# Patient Record
Sex: Female | Born: 1950
Health system: Southern US, Community
[De-identification: ages and names within clinical notes are randomized; demographics above are authoritative.]

## PROBLEM LIST (undated history)

## (undated) DIAGNOSIS — R112 Nausea with vomiting, unspecified: Secondary | ICD-10-CM

## (undated) DIAGNOSIS — N3010 Interstitial cystitis (chronic) without hematuria: Secondary | ICD-10-CM

## (undated) DIAGNOSIS — T7840XA Allergy, unspecified, initial encounter: Secondary | ICD-10-CM

## (undated) DIAGNOSIS — K589 Irritable bowel syndrome without diarrhea: Secondary | ICD-10-CM

## (undated) DIAGNOSIS — R161 Splenomegaly, not elsewhere classified: Secondary | ICD-10-CM

## (undated) DIAGNOSIS — N182 Chronic kidney disease, stage 2 (mild): Secondary | ICD-10-CM

## (undated) DIAGNOSIS — I7300 Raynaud's syndrome without gangrene: Secondary | ICD-10-CM

## (undated) DIAGNOSIS — Z9889 Other specified postprocedural states: Secondary | ICD-10-CM

## (undated) DIAGNOSIS — K5790 Diverticulosis of intestine, part unspecified, without perforation or abscess without bleeding: Secondary | ICD-10-CM

## (undated) DIAGNOSIS — C8307 Small cell B-cell lymphoma, spleen: Secondary | ICD-10-CM

## (undated) DIAGNOSIS — K649 Unspecified hemorrhoids: Secondary | ICD-10-CM

## (undated) DIAGNOSIS — Z862 Personal history of diseases of the blood and blood-forming organs and certain disorders involving the immune mechanism: Secondary | ICD-10-CM

## (undated) DIAGNOSIS — H269 Unspecified cataract: Secondary | ICD-10-CM

## (undated) DIAGNOSIS — K219 Gastro-esophageal reflux disease without esophagitis: Secondary | ICD-10-CM

## (undated) DIAGNOSIS — K449 Diaphragmatic hernia without obstruction or gangrene: Secondary | ICD-10-CM

## (undated) DIAGNOSIS — M1990 Unspecified osteoarthritis, unspecified site: Secondary | ICD-10-CM

## (undated) DIAGNOSIS — G4700 Insomnia, unspecified: Secondary | ICD-10-CM

## (undated) DIAGNOSIS — D696 Thrombocytopenia, unspecified: Secondary | ICD-10-CM

## (undated) DIAGNOSIS — D649 Anemia, unspecified: Secondary | ICD-10-CM

## (undated) DIAGNOSIS — K5900 Constipation, unspecified: Secondary | ICD-10-CM

## (undated) DIAGNOSIS — M810 Age-related osteoporosis without current pathological fracture: Secondary | ICD-10-CM

## (undated) DIAGNOSIS — C8590 Non-Hodgkin lymphoma, unspecified, unspecified site: Secondary | ICD-10-CM

## (undated) DIAGNOSIS — K222 Esophageal obstruction: Secondary | ICD-10-CM

## (undated) HISTORY — DX: Interstitial cystitis (chronic) without hematuria: N30.10

## (undated) HISTORY — DX: Unspecified cataract: H26.9

## (undated) HISTORY — DX: Allergy, unspecified, initial encounter: T78.40XA

## (undated) HISTORY — PX: TONSILLECTOMY: SUR1361

## (undated) HISTORY — DX: Diverticulosis of intestine, part unspecified, without perforation or abscess without bleeding: K57.90

## (undated) HISTORY — DX: Constipation, unspecified: K59.00

## (undated) HISTORY — DX: Small cell b-cell lymphoma, spleen: C83.07

## (undated) HISTORY — DX: Personal history of diseases of the blood and blood-forming organs and certain disorders involving the immune mechanism: Z86.2

## (undated) HISTORY — PX: BREAST LUMPECTOMY: SHX2

## (undated) HISTORY — DX: Anemia, unspecified: D64.9

## (undated) HISTORY — DX: Unspecified osteoarthritis, unspecified site: M19.90

## (undated) HISTORY — PX: TUBAL LIGATION: SHX77

## (undated) HISTORY — PX: DILATION AND CURETTAGE OF UTERUS: SHX78

## (undated) HISTORY — DX: Age-related osteoporosis without current pathological fracture: M81.0

## (undated) HISTORY — DX: Gastro-esophageal reflux disease without esophagitis: K21.9

## (undated) HISTORY — DX: Raynaud's syndrome without gangrene: I73.00

## (undated) HISTORY — DX: Insomnia, unspecified: G47.00

## (undated) HISTORY — DX: Esophageal obstruction: K22.2

## (undated) HISTORY — DX: Irritable bowel syndrome without diarrhea: K58.9

## (undated) HISTORY — PX: ESOPHAGEAL DILATION: SHX303

## (undated) HISTORY — DX: Irritable bowel syndrome, unspecified: K58.9

## (undated) HISTORY — DX: Unspecified hemorrhoids: K64.9

## (undated) HISTORY — DX: Diaphragmatic hernia without obstruction or gangrene: K44.9

## (undated) HISTORY — DX: Chronic kidney disease, stage 2 (mild): N18.2

## (undated) HISTORY — DX: Splenomegaly, not elsewhere classified: R16.1

## (undated) HISTORY — DX: Thrombocytopenia, unspecified: D69.6

## (undated) HISTORY — DX: Non-Hodgkin lymphoma, unspecified, unspecified site: C85.90

---

## 1997-06-12 ENCOUNTER — Encounter: Payer: Self-pay | Admitting: Internal Medicine

## 1997-07-27 ENCOUNTER — Emergency Department (HOSPITAL_COMMUNITY): Admission: EM | Admit: 1997-07-27 | Discharge: 1997-07-27 | Payer: Self-pay | Admitting: Emergency Medicine

## 1997-08-23 ENCOUNTER — Inpatient Hospital Stay (HOSPITAL_COMMUNITY): Admission: AD | Admit: 1997-08-23 | Discharge: 1997-08-23 | Payer: Self-pay | Admitting: Obstetrics and Gynecology

## 1997-12-22 ENCOUNTER — Emergency Department (HOSPITAL_COMMUNITY): Admission: EM | Admit: 1997-12-22 | Discharge: 1997-12-22 | Payer: Self-pay | Admitting: Emergency Medicine

## 1997-12-22 ENCOUNTER — Encounter: Payer: Self-pay | Admitting: Emergency Medicine

## 1997-12-31 ENCOUNTER — Emergency Department (HOSPITAL_COMMUNITY): Admission: EM | Admit: 1997-12-31 | Discharge: 1997-12-31 | Payer: Self-pay | Admitting: Emergency Medicine

## 1998-09-21 ENCOUNTER — Emergency Department (HOSPITAL_COMMUNITY): Admission: EM | Admit: 1998-09-21 | Discharge: 1998-09-21 | Payer: Self-pay | Admitting: Emergency Medicine

## 1998-09-24 ENCOUNTER — Encounter (HOSPITAL_COMMUNITY): Admission: RE | Admit: 1998-09-24 | Discharge: 1998-12-23 | Payer: Self-pay | Admitting: Internal Medicine

## 1999-01-20 ENCOUNTER — Encounter: Payer: Self-pay | Admitting: Internal Medicine

## 1999-01-20 LAB — CONVERTED CEMR LAB

## 2000-01-09 ENCOUNTER — Encounter: Payer: Self-pay | Admitting: Internal Medicine

## 2000-01-09 ENCOUNTER — Ambulatory Visit (HOSPITAL_COMMUNITY): Admission: RE | Admit: 2000-01-09 | Discharge: 2000-01-09 | Payer: Self-pay | Admitting: Internal Medicine

## 2000-05-13 ENCOUNTER — Encounter: Payer: Self-pay | Admitting: Internal Medicine

## 2000-05-13 ENCOUNTER — Ambulatory Visit (HOSPITAL_COMMUNITY): Admission: RE | Admit: 2000-05-13 | Discharge: 2000-05-13 | Payer: Self-pay | Admitting: Internal Medicine

## 2000-06-29 ENCOUNTER — Encounter: Payer: Self-pay | Admitting: Obstetrics & Gynecology

## 2000-06-29 ENCOUNTER — Ambulatory Visit (HOSPITAL_COMMUNITY): Admission: RE | Admit: 2000-06-29 | Discharge: 2000-06-29 | Payer: Self-pay | Admitting: Obstetrics & Gynecology

## 2000-10-05 ENCOUNTER — Other Ambulatory Visit: Admission: RE | Admit: 2000-10-05 | Discharge: 2000-10-05 | Payer: Self-pay | Admitting: Obstetrics & Gynecology

## 2001-01-02 ENCOUNTER — Encounter: Payer: Self-pay | Admitting: Emergency Medicine

## 2001-01-02 ENCOUNTER — Emergency Department (HOSPITAL_COMMUNITY): Admission: EM | Admit: 2001-01-02 | Discharge: 2001-01-02 | Payer: Self-pay | Admitting: Emergency Medicine

## 2001-01-05 ENCOUNTER — Ambulatory Visit (HOSPITAL_COMMUNITY): Admission: RE | Admit: 2001-01-05 | Discharge: 2001-01-05 | Payer: Self-pay | Admitting: Internal Medicine

## 2001-01-05 ENCOUNTER — Encounter: Payer: Self-pay | Admitting: Internal Medicine

## 2001-01-25 ENCOUNTER — Encounter: Payer: Self-pay | Admitting: Internal Medicine

## 2001-01-25 ENCOUNTER — Encounter: Payer: Self-pay | Admitting: Emergency Medicine

## 2001-01-25 ENCOUNTER — Inpatient Hospital Stay (HOSPITAL_COMMUNITY): Admission: EM | Admit: 2001-01-25 | Discharge: 2001-01-27 | Payer: Self-pay | Admitting: Emergency Medicine

## 2001-01-26 ENCOUNTER — Encounter: Payer: Self-pay | Admitting: Internal Medicine

## 2001-01-26 ENCOUNTER — Encounter: Payer: Self-pay | Admitting: *Deleted

## 2001-03-09 ENCOUNTER — Ambulatory Visit (HOSPITAL_COMMUNITY): Admission: RE | Admit: 2001-03-09 | Discharge: 2001-03-09 | Payer: Self-pay | Admitting: Internal Medicine

## 2001-03-09 ENCOUNTER — Encounter: Payer: Self-pay | Admitting: Internal Medicine

## 2001-04-22 ENCOUNTER — Ambulatory Visit (HOSPITAL_COMMUNITY): Admission: RE | Admit: 2001-04-22 | Discharge: 2001-04-22 | Payer: Self-pay | Admitting: Internal Medicine

## 2001-04-22 ENCOUNTER — Encounter: Payer: Self-pay | Admitting: Internal Medicine

## 2002-04-11 ENCOUNTER — Encounter: Payer: Self-pay | Admitting: Urology

## 2002-04-11 ENCOUNTER — Ambulatory Visit (HOSPITAL_COMMUNITY): Admission: RE | Admit: 2002-04-11 | Discharge: 2002-04-11 | Payer: Self-pay | Admitting: Urology

## 2002-05-15 ENCOUNTER — Other Ambulatory Visit: Admission: RE | Admit: 2002-05-15 | Discharge: 2002-05-15 | Payer: Self-pay | Admitting: Obstetrics & Gynecology

## 2002-06-15 ENCOUNTER — Ambulatory Visit (HOSPITAL_COMMUNITY): Admission: RE | Admit: 2002-06-15 | Discharge: 2002-06-15 | Payer: Self-pay | Admitting: Internal Medicine

## 2002-06-30 ENCOUNTER — Encounter: Payer: Self-pay | Admitting: Internal Medicine

## 2002-06-30 ENCOUNTER — Ambulatory Visit (HOSPITAL_COMMUNITY): Admission: RE | Admit: 2002-06-30 | Discharge: 2002-06-30 | Payer: Self-pay | Admitting: Internal Medicine

## 2003-06-26 ENCOUNTER — Emergency Department (HOSPITAL_COMMUNITY): Admission: EM | Admit: 2003-06-26 | Discharge: 2003-06-26 | Payer: Self-pay | Admitting: Family Medicine

## 2003-07-04 ENCOUNTER — Other Ambulatory Visit: Admission: RE | Admit: 2003-07-04 | Discharge: 2003-07-04 | Payer: Self-pay | Admitting: Obstetrics & Gynecology

## 2004-02-06 ENCOUNTER — Ambulatory Visit: Payer: Self-pay | Admitting: Internal Medicine

## 2004-02-13 ENCOUNTER — Ambulatory Visit: Payer: Self-pay | Admitting: Internal Medicine

## 2004-03-20 ENCOUNTER — Ambulatory Visit: Payer: Self-pay | Admitting: Internal Medicine

## 2004-06-25 ENCOUNTER — Ambulatory Visit: Payer: Self-pay | Admitting: Internal Medicine

## 2004-07-24 ENCOUNTER — Ambulatory Visit (HOSPITAL_COMMUNITY): Admission: RE | Admit: 2004-07-24 | Discharge: 2004-07-24 | Payer: Self-pay | Admitting: Internal Medicine

## 2004-07-24 ENCOUNTER — Ambulatory Visit: Payer: Self-pay | Admitting: Internal Medicine

## 2004-11-06 ENCOUNTER — Other Ambulatory Visit: Admission: RE | Admit: 2004-11-06 | Discharge: 2004-11-06 | Payer: Self-pay | Admitting: Obstetrics & Gynecology

## 2005-04-07 ENCOUNTER — Ambulatory Visit: Payer: Self-pay | Admitting: Internal Medicine

## 2005-04-17 ENCOUNTER — Ambulatory Visit: Payer: Self-pay | Admitting: Internal Medicine

## 2005-04-17 ENCOUNTER — Ambulatory Visit (HOSPITAL_COMMUNITY): Admission: RE | Admit: 2005-04-17 | Discharge: 2005-04-17 | Payer: Self-pay | Admitting: Internal Medicine

## 2005-05-07 ENCOUNTER — Ambulatory Visit: Payer: Self-pay | Admitting: Internal Medicine

## 2005-09-02 ENCOUNTER — Ambulatory Visit (HOSPITAL_COMMUNITY): Admission: RE | Admit: 2005-09-02 | Discharge: 2005-09-02 | Payer: Self-pay | Admitting: Obstetrics & Gynecology

## 2005-09-07 ENCOUNTER — Ambulatory Visit: Payer: Self-pay | Admitting: Internal Medicine

## 2005-09-14 ENCOUNTER — Ambulatory Visit: Payer: Self-pay | Admitting: Internal Medicine

## 2005-11-16 ENCOUNTER — Ambulatory Visit: Payer: Self-pay | Admitting: Internal Medicine

## 2006-03-06 ENCOUNTER — Emergency Department (HOSPITAL_COMMUNITY): Admission: EM | Admit: 2006-03-06 | Discharge: 2006-03-06 | Payer: Self-pay | Admitting: Family Medicine

## 2006-03-10 ENCOUNTER — Observation Stay (HOSPITAL_COMMUNITY): Admission: EM | Admit: 2006-03-10 | Discharge: 2006-03-11 | Payer: Self-pay | Admitting: Emergency Medicine

## 2006-03-10 ENCOUNTER — Ambulatory Visit: Payer: Self-pay | Admitting: Internal Medicine

## 2006-04-05 ENCOUNTER — Ambulatory Visit: Payer: Self-pay | Admitting: Internal Medicine

## 2006-06-18 ENCOUNTER — Ambulatory Visit: Payer: Self-pay | Admitting: Internal Medicine

## 2006-08-30 ENCOUNTER — Encounter: Payer: Self-pay | Admitting: Internal Medicine

## 2006-08-30 DIAGNOSIS — R197 Diarrhea, unspecified: Secondary | ICD-10-CM | POA: Insufficient documentation

## 2006-08-30 DIAGNOSIS — I7300 Raynaud's syndrome without gangrene: Secondary | ICD-10-CM | POA: Insufficient documentation

## 2006-08-30 DIAGNOSIS — J309 Allergic rhinitis, unspecified: Secondary | ICD-10-CM | POA: Insufficient documentation

## 2006-08-30 DIAGNOSIS — K589 Irritable bowel syndrome without diarrhea: Secondary | ICD-10-CM | POA: Insufficient documentation

## 2006-09-10 ENCOUNTER — Ambulatory Visit (HOSPITAL_COMMUNITY): Admission: RE | Admit: 2006-09-10 | Discharge: 2006-09-10 | Payer: Self-pay | Admitting: Obstetrics & Gynecology

## 2006-10-14 ENCOUNTER — Encounter: Payer: Self-pay | Admitting: Internal Medicine

## 2006-10-14 LAB — CONVERTED CEMR LAB
AST: 18 units/L (ref 0–37)
Albumin: 4.3 g/dL (ref 3.5–5.2)
Alkaline Phosphatase: 103 units/L (ref 39–117)
CO2: 23 meq/L (ref 19–32)
Calcium: 9.6 mg/dL (ref 8.4–10.5)
Cholesterol: 153 mg/dL (ref 0–200)
Creatinine, Ser: 1.01 mg/dL (ref 0.40–1.20)
Glucose, Bld: 81 mg/dL (ref 70–99)
Hemoglobin, Urine: NEGATIVE
Ketones, ur: NEGATIVE mg/dL
RBC / HPF: NONE SEEN (ref <3)
Specific Gravity, Urine: 1.014 (ref 1.005–1.03)
TSH: 2.073 microintl units/mL (ref 0.350–5.50)
Total Bilirubin: 0.8 mg/dL (ref 0.3–1.2)
Urine Glucose: NEGATIVE mg/dL
Urobilinogen, UA: 0.2 (ref 0.0–1.0)

## 2006-10-22 ENCOUNTER — Ambulatory Visit: Payer: Self-pay | Admitting: Internal Medicine

## 2006-12-10 ENCOUNTER — Emergency Department (HOSPITAL_COMMUNITY): Admission: EM | Admit: 2006-12-10 | Discharge: 2006-12-10 | Payer: Self-pay | Admitting: Family Medicine

## 2006-12-10 ENCOUNTER — Telehealth: Payer: Self-pay | Admitting: Internal Medicine

## 2006-12-14 ENCOUNTER — Telehealth: Payer: Self-pay | Admitting: Internal Medicine

## 2007-01-27 ENCOUNTER — Emergency Department (HOSPITAL_COMMUNITY): Admission: EM | Admit: 2007-01-27 | Discharge: 2007-01-27 | Payer: Self-pay | Admitting: Family Medicine

## 2007-02-11 ENCOUNTER — Ambulatory Visit: Payer: Self-pay | Admitting: Internal Medicine

## 2007-02-23 ENCOUNTER — Telehealth: Payer: Self-pay | Admitting: Internal Medicine

## 2007-03-04 ENCOUNTER — Ambulatory Visit (HOSPITAL_COMMUNITY): Admission: RE | Admit: 2007-03-04 | Discharge: 2007-03-04 | Payer: Self-pay | Admitting: Internal Medicine

## 2007-03-04 ENCOUNTER — Encounter: Payer: Self-pay | Admitting: Internal Medicine

## 2007-03-04 ENCOUNTER — Telehealth: Payer: Self-pay | Admitting: Internal Medicine

## 2007-03-17 ENCOUNTER — Ambulatory Visit: Payer: Self-pay | Admitting: Internal Medicine

## 2007-04-27 ENCOUNTER — Telehealth: Payer: Self-pay | Admitting: Internal Medicine

## 2007-04-28 ENCOUNTER — Emergency Department (HOSPITAL_COMMUNITY): Admission: EM | Admit: 2007-04-28 | Discharge: 2007-04-28 | Payer: Self-pay | Admitting: Family Medicine

## 2007-05-10 ENCOUNTER — Telehealth: Payer: Self-pay | Admitting: Internal Medicine

## 2007-08-04 ENCOUNTER — Ambulatory Visit: Payer: Self-pay | Admitting: Internal Medicine

## 2007-08-04 DIAGNOSIS — G4700 Insomnia, unspecified: Secondary | ICD-10-CM | POA: Insufficient documentation

## 2007-08-04 DIAGNOSIS — B070 Plantar wart: Secondary | ICD-10-CM | POA: Insufficient documentation

## 2007-10-07 ENCOUNTER — Emergency Department (HOSPITAL_COMMUNITY): Admission: EM | Admit: 2007-10-07 | Discharge: 2007-10-07 | Payer: Self-pay | Admitting: Family Medicine

## 2007-10-18 ENCOUNTER — Telehealth: Payer: Self-pay | Admitting: Internal Medicine

## 2007-10-18 ENCOUNTER — Ambulatory Visit: Payer: Self-pay | Admitting: Internal Medicine

## 2007-10-18 DIAGNOSIS — N3000 Acute cystitis without hematuria: Secondary | ICD-10-CM | POA: Insufficient documentation

## 2007-10-18 LAB — CONVERTED CEMR LAB: Ketones, urine, test strip: NEGATIVE

## 2007-10-26 ENCOUNTER — Encounter: Payer: Self-pay | Admitting: Internal Medicine

## 2007-11-24 ENCOUNTER — Telehealth: Payer: Self-pay | Admitting: Internal Medicine

## 2007-11-29 ENCOUNTER — Ambulatory Visit (HOSPITAL_COMMUNITY): Admission: RE | Admit: 2007-11-29 | Discharge: 2007-11-29 | Payer: Self-pay | Admitting: Obstetrics & Gynecology

## 2007-12-28 ENCOUNTER — Telehealth (INDEPENDENT_AMBULATORY_CARE_PROVIDER_SITE_OTHER): Payer: Self-pay | Admitting: *Deleted

## 2008-01-27 ENCOUNTER — Ambulatory Visit (HOSPITAL_COMMUNITY): Admission: RE | Admit: 2008-01-27 | Discharge: 2008-01-27 | Payer: Self-pay | Admitting: Obstetrics & Gynecology

## 2008-01-27 ENCOUNTER — Encounter (INDEPENDENT_AMBULATORY_CARE_PROVIDER_SITE_OTHER): Payer: Self-pay | Admitting: Obstetrics & Gynecology

## 2008-04-04 ENCOUNTER — Telehealth: Payer: Self-pay | Admitting: Internal Medicine

## 2008-04-05 ENCOUNTER — Telehealth: Payer: Self-pay | Admitting: *Deleted

## 2008-04-05 ENCOUNTER — Emergency Department (HOSPITAL_COMMUNITY): Admission: EM | Admit: 2008-04-05 | Discharge: 2008-04-05 | Payer: Self-pay | Admitting: Family Medicine

## 2008-04-17 ENCOUNTER — Encounter: Payer: Self-pay | Admitting: Internal Medicine

## 2008-05-15 ENCOUNTER — Telehealth: Payer: Self-pay | Admitting: Internal Medicine

## 2008-05-22 DIAGNOSIS — K449 Diaphragmatic hernia without obstruction or gangrene: Secondary | ICD-10-CM | POA: Insufficient documentation

## 2008-05-22 DIAGNOSIS — K649 Unspecified hemorrhoids: Secondary | ICD-10-CM | POA: Insufficient documentation

## 2008-05-22 DIAGNOSIS — K222 Esophageal obstruction: Secondary | ICD-10-CM | POA: Insufficient documentation

## 2008-05-24 ENCOUNTER — Ambulatory Visit: Payer: Self-pay | Admitting: Internal Medicine

## 2008-05-24 DIAGNOSIS — K625 Hemorrhage of anus and rectum: Secondary | ICD-10-CM | POA: Insufficient documentation

## 2008-05-24 DIAGNOSIS — K219 Gastro-esophageal reflux disease without esophagitis: Secondary | ICD-10-CM | POA: Insufficient documentation

## 2008-05-28 ENCOUNTER — Ambulatory Visit: Payer: Self-pay | Admitting: Internal Medicine

## 2008-05-28 ENCOUNTER — Ambulatory Visit (HOSPITAL_COMMUNITY): Admission: RE | Admit: 2008-05-28 | Discharge: 2008-05-28 | Payer: Self-pay | Admitting: Internal Medicine

## 2008-06-01 ENCOUNTER — Encounter: Payer: Self-pay | Admitting: Internal Medicine

## 2008-06-04 ENCOUNTER — Ambulatory Visit: Payer: Self-pay | Admitting: Internal Medicine

## 2008-06-04 DIAGNOSIS — H6120 Impacted cerumen, unspecified ear: Secondary | ICD-10-CM | POA: Insufficient documentation

## 2008-06-04 DIAGNOSIS — B351 Tinea unguium: Secondary | ICD-10-CM | POA: Insufficient documentation

## 2008-06-04 DIAGNOSIS — N3010 Interstitial cystitis (chronic) without hematuria: Secondary | ICD-10-CM | POA: Insufficient documentation

## 2008-06-08 ENCOUNTER — Telehealth: Payer: Self-pay | Admitting: Internal Medicine

## 2008-07-27 ENCOUNTER — Ambulatory Visit: Payer: Self-pay | Admitting: Internal Medicine

## 2008-07-27 DIAGNOSIS — D692 Other nonthrombocytopenic purpura: Secondary | ICD-10-CM | POA: Insufficient documentation

## 2008-07-27 DIAGNOSIS — R634 Abnormal weight loss: Secondary | ICD-10-CM | POA: Insufficient documentation

## 2008-07-27 LAB — CONVERTED CEMR LAB
Alkaline Phosphatase: 113 units/L (ref 39–117)
Basophils Relative: 1 % (ref 0–1)
Eosinophils Relative: 1 % (ref 0–5)
Folate: 18.9 ng/mL
HCT: 40.4 % (ref 36.0–46.0)
INR: 0.9 (ref 0.0–1.5)
Indirect Bilirubin: 0.4 mg/dL (ref 0.0–0.9)
MCV: 88.2 fL (ref 78.0–100.0)
Monocytes Absolute: 0.5 10*3/uL (ref 0.1–1.0)
Monocytes Relative: 6 % (ref 3–12)
Neutro Abs: 4 10*3/uL (ref 1.7–7.7)
Neutrophils Relative %: 52 % (ref 43–77)
Platelets: 163 10*3/uL (ref 150–400)
Prealbumin: 25.7 mg/dL (ref 18.0–45.0)
Prothrombin Time: 12.7 s (ref 11.6–15.2)
TIBC: 332 ug/dL (ref 250–470)
Total Bilirubin: 0.5 mg/dL (ref 0.3–1.2)
aPTT: 28 s (ref 24–37)

## 2008-08-01 ENCOUNTER — Telehealth (INDEPENDENT_AMBULATORY_CARE_PROVIDER_SITE_OTHER): Payer: Self-pay | Admitting: *Deleted

## 2008-08-28 ENCOUNTER — Ambulatory Visit: Payer: Self-pay | Admitting: Sports Medicine

## 2008-08-28 DIAGNOSIS — S86819A Strain of other muscle(s) and tendon(s) at lower leg level, unspecified leg, initial encounter: Secondary | ICD-10-CM

## 2008-08-28 DIAGNOSIS — S838X9A Sprain of other specified parts of unspecified knee, initial encounter: Secondary | ICD-10-CM | POA: Insufficient documentation

## 2008-08-28 DIAGNOSIS — M25559 Pain in unspecified hip: Secondary | ICD-10-CM | POA: Insufficient documentation

## 2008-12-11 ENCOUNTER — Ambulatory Visit (HOSPITAL_COMMUNITY): Admission: RE | Admit: 2008-12-11 | Discharge: 2008-12-11 | Payer: Self-pay | Admitting: Obstetrics & Gynecology

## 2009-04-01 ENCOUNTER — Telehealth: Payer: Self-pay | Admitting: Internal Medicine

## 2009-06-03 ENCOUNTER — Telehealth: Payer: Self-pay | Admitting: Internal Medicine

## 2009-06-19 ENCOUNTER — Telehealth: Payer: Self-pay | Admitting: Internal Medicine

## 2009-07-04 ENCOUNTER — Encounter: Payer: Self-pay | Admitting: Internal Medicine

## 2009-07-07 ENCOUNTER — Encounter (INDEPENDENT_AMBULATORY_CARE_PROVIDER_SITE_OTHER): Payer: Self-pay | Admitting: *Deleted

## 2009-07-07 ENCOUNTER — Emergency Department (HOSPITAL_COMMUNITY): Admission: EM | Admit: 2009-07-07 | Discharge: 2009-07-07 | Payer: Self-pay | Admitting: Emergency Medicine

## 2009-07-08 ENCOUNTER — Telehealth: Payer: Self-pay | Admitting: Internal Medicine

## 2009-07-09 ENCOUNTER — Telehealth: Payer: Self-pay | Admitting: Internal Medicine

## 2009-07-09 ENCOUNTER — Telehealth: Payer: Self-pay | Admitting: Gastroenterology

## 2009-07-10 ENCOUNTER — Ambulatory Visit: Payer: Self-pay | Admitting: Internal Medicine

## 2009-07-10 ENCOUNTER — Encounter (INDEPENDENT_AMBULATORY_CARE_PROVIDER_SITE_OTHER): Payer: Self-pay | Admitting: *Deleted

## 2009-07-10 DIAGNOSIS — K625 Hemorrhage of anus and rectum: Secondary | ICD-10-CM | POA: Insufficient documentation

## 2009-07-10 LAB — CONVERTED CEMR LAB
Ferritin: 5.9 ng/mL — ABNORMAL LOW (ref 10.0–291.0)
Folate: 5.9 ng/mL
Iron: 25 ug/dL — ABNORMAL LOW (ref 42–145)
Transferrin: 312.9 mg/dL (ref 212.0–360.0)

## 2009-07-12 ENCOUNTER — Encounter: Payer: Self-pay | Admitting: Internal Medicine

## 2009-07-12 ENCOUNTER — Ambulatory Visit (HOSPITAL_COMMUNITY): Admission: RE | Admit: 2009-07-12 | Discharge: 2009-07-12 | Payer: Self-pay | Admitting: Internal Medicine

## 2009-07-12 ENCOUNTER — Ambulatory Visit: Payer: Self-pay | Admitting: Internal Medicine

## 2009-07-23 ENCOUNTER — Encounter (INDEPENDENT_AMBULATORY_CARE_PROVIDER_SITE_OTHER): Payer: Self-pay | Admitting: *Deleted

## 2009-07-24 ENCOUNTER — Ambulatory Visit: Payer: Self-pay | Admitting: Internal Medicine

## 2009-07-24 ENCOUNTER — Telehealth (INDEPENDENT_AMBULATORY_CARE_PROVIDER_SITE_OTHER): Payer: Self-pay | Admitting: *Deleted

## 2009-08-16 ENCOUNTER — Encounter: Payer: Self-pay | Admitting: Internal Medicine

## 2009-08-16 ENCOUNTER — Ambulatory Visit (HOSPITAL_COMMUNITY): Admission: RE | Admit: 2009-08-16 | Discharge: 2009-08-16 | Payer: Self-pay | Admitting: Internal Medicine

## 2009-08-16 ENCOUNTER — Ambulatory Visit: Payer: Self-pay | Admitting: Internal Medicine

## 2009-08-20 ENCOUNTER — Encounter: Payer: Self-pay | Admitting: Internal Medicine

## 2009-08-20 ENCOUNTER — Telehealth (INDEPENDENT_AMBULATORY_CARE_PROVIDER_SITE_OTHER): Payer: Self-pay | Admitting: *Deleted

## 2009-08-21 ENCOUNTER — Encounter: Payer: Self-pay | Admitting: Internal Medicine

## 2009-08-21 DIAGNOSIS — D509 Iron deficiency anemia, unspecified: Secondary | ICD-10-CM | POA: Insufficient documentation

## 2009-08-22 ENCOUNTER — Encounter: Payer: Self-pay | Admitting: Internal Medicine

## 2009-08-28 ENCOUNTER — Ambulatory Visit: Payer: Self-pay | Admitting: Internal Medicine

## 2009-09-05 ENCOUNTER — Telehealth: Payer: Self-pay | Admitting: Internal Medicine

## 2009-09-06 ENCOUNTER — Telehealth (INDEPENDENT_AMBULATORY_CARE_PROVIDER_SITE_OTHER): Payer: Self-pay | Admitting: *Deleted

## 2009-09-06 DIAGNOSIS — R933 Abnormal findings on diagnostic imaging of other parts of digestive tract: Secondary | ICD-10-CM | POA: Insufficient documentation

## 2009-09-09 ENCOUNTER — Telehealth: Payer: Self-pay | Admitting: Internal Medicine

## 2009-09-12 ENCOUNTER — Ambulatory Visit: Payer: Self-pay | Admitting: Internal Medicine

## 2009-09-13 ENCOUNTER — Encounter: Payer: Self-pay | Admitting: Internal Medicine

## 2009-09-13 ENCOUNTER — Ambulatory Visit (HOSPITAL_COMMUNITY): Admission: RE | Admit: 2009-09-13 | Discharge: 2009-09-13 | Payer: Self-pay | Admitting: Internal Medicine

## 2009-09-13 LAB — CONVERTED CEMR LAB
BUN: 26 mg/dL — ABNORMAL HIGH (ref 6–23)
Basophils Relative: 0.7 % (ref 0.0–3.0)
Ferritin: 16.5 ng/mL (ref 10.0–291.0)
HCT: 37.2 % (ref 36.0–46.0)
Lymphs Abs: 1.9 10*3/uL (ref 0.7–4.0)
MCHC: 34.2 g/dL (ref 30.0–36.0)
Neutro Abs: 3.7 10*3/uL (ref 1.4–7.7)

## 2009-09-16 ENCOUNTER — Telehealth: Payer: Self-pay | Admitting: Internal Medicine

## 2009-09-17 ENCOUNTER — Telehealth: Payer: Self-pay | Admitting: Internal Medicine

## 2009-09-19 ENCOUNTER — Encounter: Payer: Self-pay | Admitting: Internal Medicine

## 2009-10-09 ENCOUNTER — Ambulatory Visit: Payer: Self-pay | Admitting: Internal Medicine

## 2009-10-09 LAB — CONVERTED CEMR LAB
Eosinophils Relative: 0.2 % (ref 0.0–5.0)
HCT: 36.7 % (ref 36.0–46.0)
Hemoglobin: 12.6 g/dL (ref 12.0–15.0)
Lymphocytes Relative: 42.7 % (ref 12.0–46.0)
MCHC: 34.5 g/dL (ref 30.0–36.0)
MCV: 86.7 fL (ref 78.0–100.0)
Neutrophils Relative %: 48.4 % (ref 43.0–77.0)
Platelets: 125 10*3/uL — ABNORMAL LOW (ref 150.0–400.0)
RBC: 4.23 M/uL (ref 3.87–5.11)
RDW: 13.9 % (ref 11.5–14.6)

## 2009-10-10 ENCOUNTER — Ambulatory Visit: Payer: Self-pay | Admitting: Internal Medicine

## 2009-10-10 DIAGNOSIS — D696 Thrombocytopenia, unspecified: Secondary | ICD-10-CM | POA: Insufficient documentation

## 2009-10-10 DIAGNOSIS — R161 Splenomegaly, not elsewhere classified: Secondary | ICD-10-CM | POA: Insufficient documentation

## 2009-10-10 DIAGNOSIS — K5900 Constipation, unspecified: Secondary | ICD-10-CM

## 2009-10-10 DIAGNOSIS — K5909 Other constipation: Secondary | ICD-10-CM | POA: Insufficient documentation

## 2009-10-11 LAB — CONVERTED CEMR LAB
ALT: 18 units/L (ref 0–35)
AST: 20 units/L (ref 0–37)
Alkaline Phosphatase: 83 units/L (ref 39–117)
CO2: 25 meq/L (ref 19–32)
Chloride: 104 meq/L (ref 96–112)
Creatinine, Ser: 0.9 mg/dL (ref 0.4–1.2)
Potassium: 4.1 meq/L (ref 3.5–5.1)

## 2009-10-14 ENCOUNTER — Ambulatory Visit: Payer: Self-pay | Admitting: Oncology

## 2009-10-25 ENCOUNTER — Ambulatory Visit (HOSPITAL_BASED_OUTPATIENT_CLINIC_OR_DEPARTMENT_OTHER): Admission: RE | Admit: 2009-10-25 | Discharge: 2009-10-25 | Payer: Self-pay | Admitting: Urology

## 2009-11-01 ENCOUNTER — Encounter: Payer: Self-pay | Admitting: Internal Medicine

## 2009-11-06 ENCOUNTER — Ambulatory Visit: Payer: Self-pay | Admitting: Gastroenterology

## 2009-11-06 ENCOUNTER — Encounter: Payer: Self-pay | Admitting: Internal Medicine

## 2009-11-08 ENCOUNTER — Encounter: Payer: Self-pay | Admitting: Internal Medicine

## 2009-11-08 LAB — CBC & DIFF AND RETIC
BASO%: 0.4 % (ref 0.0–2.0)
Basophils Absolute: 0 10*3/uL (ref 0.0–0.1)
EOS%: 0.3 % (ref 0.0–7.0)
Eosinophils Absolute: 0 10*3/uL (ref 0.0–0.5)
HCT: 38.2 % (ref 34.8–46.6)
HGB: 12.8 g/dL (ref 11.6–15.9)
Immature Retic Fract: 2.2 % (ref 0.00–10.70)
MCH: 29 pg (ref 25.1–34.0)
MCV: 86.6 fL (ref 79.5–101.0)
NEUT#: 3.7 10*3/uL (ref 1.5–6.5)
NEUT%: 53.5 % (ref 38.4–76.8)
Platelets: 138 10*3/uL — ABNORMAL LOW (ref 145–400)
RBC: 4.41 10*6/uL (ref 3.70–5.45)
nRBC: 0 % (ref 0–0)

## 2009-11-08 LAB — CHCC SMEAR

## 2009-11-12 LAB — LACTATE DEHYDROGENASE: LDH: 160 U/L (ref 94–250)

## 2009-11-12 LAB — IMMUNOFIXATION ELECTROPHORESIS
IgA: 91 mg/dL (ref 68–378)
IgG (Immunoglobin G), Serum: 716 mg/dL (ref 694–1618)
Total Protein, Serum Electrophoresis: 5.6 g/dL — ABNORMAL LOW (ref 6.0–8.3)

## 2009-11-12 LAB — COMPREHENSIVE METABOLIC PANEL
AST: 16 U/L (ref 0–37)
BUN: 21 mg/dL (ref 6–23)
Calcium: 8.7 mg/dL (ref 8.4–10.5)
Chloride: 106 mEq/L (ref 96–112)
Creatinine, Ser: 0.99 mg/dL (ref 0.40–1.20)
Total Bilirubin: 0.4 mg/dL (ref 0.3–1.2)

## 2009-11-14 LAB — CRYOGLOBULIN

## 2009-11-14 LAB — SJOGRENS SYNDROME-B EXTRACTABLE NUCLEAR ANTIBODY: SSB (La) (ENA) Antibody, IgG: <1 AU/mL (ref <30)

## 2009-11-22 ENCOUNTER — Encounter: Payer: Self-pay | Admitting: Internal Medicine

## 2009-12-25 ENCOUNTER — Encounter: Payer: Self-pay | Admitting: Internal Medicine

## 2010-01-19 HISTORY — PX: APPENDECTOMY: SHX54

## 2010-02-10 ENCOUNTER — Encounter: Payer: Self-pay | Admitting: Obstetrics & Gynecology

## 2010-02-18 NOTE — Procedures (Signed)
Summary: Upper Endoscopy  Patient: Shannon Barajas Note: All result statuses are Final unless otherwise noted.  Tests: (1) Upper Endoscopy (EGD)   EGD Upper Endoscopy       DONE     [facility]     [address] [place], [place]  [address]           ENDOSCOPY PROCEDURE REPORT           PATIENT:  [name], [name]  MR#:  [id number]     BIRTHDATE:  Jan 16, 1951, 59 yrs. old  GENDER:  female           ENDOSCOPIST:  Wilhemina Bonito. Eda Keys, MD     Referred by:  Office           PROCEDURE DATE:  08/16/2009     PROCEDURE:  EGD with balloon dilatation,     EGD with biopsy,     EGD with Submucosal Injection     ASA CLASS:  Class II     INDICATIONS:  iron deficiency anemia, dysphagia, dilation of     esophageal stricture           MEDICATIONS:   Fentanyl 125 mcg IV, Versed 12 mg, Benadryl 50 mg     TOPICAL ANESTHETIC:  Cetacaine Spray           DESCRIPTION OF PROCEDURE:   After the risks benefits and     alternatives of the procedure were thoroughly explained, informed     consent was obtained.  The Pentax Gastroscope [id number] endoscope     was introduced through the mouth and advanced to the third portion     of the duodenum, without limitations.  The instrument was slowly     withdrawn as the mucosa was fully examined.     <<PROCEDUREIMAGES>>           A large caliber ring-like stricture was found in the distal     esophagus.  The stomach was entered and closely examined. The     antrum, angularis, and lesser curvature were well visualized,     including a retroflexed view of the cardia and fundus. The stomach     wall was normally distensable. The scope passed easily through the     pylorus into the duodenum.  The duodenal bulb was normal in     appearance, as was the postbulbar duodenum to third portion.     Postbulbar duodenum bx to r/o sprue.    Retroflexed views revealed     no abnormalities.           THERAPY: 1.STRICTURE INJECTED SUBMUCOSALLY IN 4 Q WITH 10MG   TRIAMCINOLONE IN EACH QUAD.     2. SEQUENTIAL TTS BALLOON DILATION OF     STRICTURE 18-19-20. MILD RESISTANCE , NO HEME, TOLERATED WELL           COMPLICATIONS:  None           ENDOSCOPIC IMPRESSION:     1) Stricture in the distal esophagus - S/P TRIAMCINOLONE     INJECTION THERAPY FOLLOWED BY BALLOON DILATION TO     2) Normal stomach     3) Normal duodenum - S/P BX TO R/O SPRUE           RECOMMENDATIONS:     1) Await biopsy results     2) Clear liquids until 11:30am, then soft foods rest of day.     Resume prior diet tomorrow.  3) Continue PPI (GERD MED)     4) Capsule endoscopy to be arranged by Dr Lamar Sprinkles office if     biopsies negative     5) Daily iron supplement           ______________________________     Wilhemina Bonito. Eda Keys, MD           CC:  Stacie Glaze, MD, The Patient           n.     eSIGNEDWilhemina Bonito. Eda Keys at 08/16/2009 09:43 AM           Mickeal Needy, [id number]  Note: An exclamation mark (!) indicates a result that was not dispersed into the flowsheet. Document Creation Date: 08/16/2009 9:44 AM _______________________________________________________________________  (1) Order result status: Final Collection or observation date-time: 08/16/2009 09:16 Requested date-time:  Receipt date-time:  Reported date-time:  Referring Physician:   Ordering Physician: Fransico Setters [phone]) Specimen Source:  Source: Launa Grill Order Number: [id number] Lab site:

## 2010-02-18 NOTE — Progress Notes (Signed)
Summary: now with dysphagia and wants dilation   Phone Note Other Incoming   Summary of Call: Pt. requested to see me after her pre-visit for EGD.Says she is having dysphagia and is requesting a dil. be done at same time. Initial call taken by: Teryl Lucy RN,  July 24, 2009 3:19 PM  Follow-up for Phone Call        routine EGD as previously planned could have been done in the [facility]. However, for esophageal dilation, for this patient, it needs to be at the hospital. Set her up for EGD with balloon dilation (need the 18-19-20 mm balloon) and have available triamcinolone for injection therapy. This should be done during my next hospital week. Cancel her [facility] appointment Follow-up by: Hilarie Fredrickson MD,  July 24, 2009 3:38 PM  Additional Follow-up for Phone Call Additional follow up Details #1::        message left with pt.'s husband to have her call me.   .sign     Appended Document: now with dysphagia and wants dilation Pt's dil scheduled at [facility] on 08/16/09 at 8:30 am. message left for pt. to call me back.  Appended Document: now with dysphagia and wants dilation Pt. given instructions for egd/dil.

## 2010-02-18 NOTE — Miscellaneous (Signed)
Summary: Anusol hc supp. order  Clinical Lists Changes     Anusol HC supposotories 1 pr q.h.s as needed # 30 called to [facility] pharmacy per Dr.Bernard Donahoo

## 2010-02-18 NOTE — Progress Notes (Signed)
Summary: [facility] Questions for [facility]   Phone Note Call from Patient Call back at [facility] Phone [phone]   Call For: Dr Marina Goodell Reason for Call: Talk to Nurse Summary of Call: Is having a procedure this fri and feel smore comfortable asking Elnita Maxwell her 2 questions Initial call taken by: Leanor Kail [facility],  September 09, 2009 10:50 AM  Follow-up for Phone Call        Test questions answered.Asking when Dr.Elinda Bunten feels her  lab work  should be re-checked as she has become even more tired.Says getting up,dressing and moving about is really an effort. Is on iron once a day. Is for C.T. enterography this Friday. Follow-up by: Teryl Lucy RN,  September 09, 2009 1:09 PM  Additional Follow-up for Phone Call Additional follow up Details #1::        We can recheck anytime now. If still low, we will increase her iron and wait on CTE Additional Follow-up by: Hilarie Fredrickson MD,  September 09, 2009 1:36 PM    Additional Follow-up for Phone Call Additional follow up Details #2::    Do you want to order CBCD only or do you want iron studies repeated.? Follow-up by: Teryl Lucy RN,  September 09, 2009 2:26 PM  Additional Follow-up for Phone Call Additional follow up Details #3:: Details for Additional Follow-up Action Taken: CBCD and ferritin Additional Follow-up by: Hilarie Fredrickson MD,  September 09, 2009 3:25 PM  Pt. is coming for BUN/CREAT on Thursday so CBCD and FERR. added.Pt. aware.

## 2010-02-18 NOTE — Procedures (Signed)
Summary: Colonoscopy  Patient: Shannon Barajas Note: All result statuses are Final unless otherwise noted.  Tests: (1) Colonoscopy (COL)   COL Colonoscopy           DONE     [facility]     [address] [place], [place]  [address]           COLONOSCOPY PROCEDURE REPORT           PATIENT:  Shannon Barajas, Shannon Barajas  MR#:  [id number]     BIRTHDATE:  [birth year], 59 yrs. old  GENDER:  female     ENDOSCOPIST:  Wilhemina Bonito. Eda Keys, MD     REF. BY:  .Direct     PROCEDURE DATE:  07/12/2009     PROCEDURE:  Diagnostic Colonoscopy     ASA CLASS:  Class II     INDICATIONS:  rectal bleeding, Iron Deficiency Anemia     MEDICATIONS:   Fentanyl 125 mcg IV, Versed 12 mg IV, Benadryl 50     mg IV           DESCRIPTION OF PROCEDURE:   After the risks benefits and     alternatives of the procedure were thoroughly explained, informed     consent was obtained.  Digital rectal exam was performed and     revealed no abnormalities.   The Pentax Colonoscope [id number]     endoscope was introduced through the anus and advanced to the     cecum, which was identified by both the appendix and ileocecal     valve, without limitations.  The quality of the prep was     excellent, using MiraLax.  The instrument was then slowly     withdrawn as the colon was fully examined.     <<PROCEDUREIMAGES>>           FINDINGS:  Mild diverticulosis was found in the sigmoid colon.     This was otherwise a normal examination of the colon from the     rectum to cecum. Short intubation of yhe ileum revealed no     abnormalities..  No polyps or cancers were seen.   Retroflexed     views in the rectum revealed internal hemorrhoids.    The scope     was then withdrawn from the patient and the procedure completed.           COMPLICATIONS:  None     ENDOSCOPIC IMPRESSION:     1) Mild diverticulosis in the sigmoid colon     2) Otherwise normal examination     3) No polyps or cancers     4) Internal hemorrhoids        RECOMMENDATIONS:     1) Continue current colorectal screening recommendations for     "routine risk" patients with a repeat colonoscopy in 10 years.     2) Anusol HC suppositories PR qhs prn     3) OTC iron supplement daily     4) We will schedule EGD in [facility] to further evaluate iron deficiency     anemia.           ______________________________     Wilhemina Bonito. Eda Keys, MD           CC:  Stacie Glaze, MD;The Patient           n.     Rosalie DoctorWilhemina Bonito. Eda Keys at 07/12/2009 10:32 AM  Mariangela, [place], [id number]  Note: An exclamation mark (!) indicates a result that was not dispersed into the flowsheet. Document Creation Date: 07/12/2009 10:32 AM _______________________________________________________________________  (1) Order result status: Final Collection or observation date-time: 07/12/2009 10:24 Requested date-time:  Receipt date-time:  Reported date-time:  Referring Physician:   Ordering Physician: Fransico Setters [phone]) Specimen Source:  Source: Launa Grill Order Number: [id number] Lab site:

## 2010-02-18 NOTE — Progress Notes (Signed)
Summary: Daughter calling   Phone Note Call from Patient   Caller: Daughter Reason for Call: Refill Medication Summary of Call: On call note at 1845. Pts daugher states pt is having recurrent BRBPR and abd swelling for a week or more. She was seen in ED on Fathers Day for this and sent home. Apparently a CT and blookwork were OK. Pt wants to be seen by Dr. Marina Goodell and wants a call back tomorrow from Dr. Marina Goodell. Pts daughter can be reached at [phone]. Initial call taken by: Meryl Dare MD FACG,  July 09, 2009 6:52 PM  Follow-up for Phone Call        CL,  Call pt directly and get info. Also, put ER info and CBC (in drawn) in EMR. Note normal colonoscopy, except for hemorrhoids, in 2009 Follow-up by: Hilarie Fredrickson MD,  July 09, 2009 8:49 PM  Additional Follow-up for Phone Call Additional follow up Details #1::        appt scheduled for Colon at [facility] hospital on 07/12/09 pt to come in for instructions.   Appt for Monday left on the schedule per Dr Marina Goodell Additional Follow-up by: Chales Abrahams CMA Duncan Dull),  July 10, 2009 9:22 AM

## 2010-02-18 NOTE — Miscellaneous (Signed)
Summary: Miralax  Clinical Lists Changes  Medications: Added new medication of MIRALAX   POWD (POLYETHYLENE GLYCOL 3350) As per prep  instructions. - Signed Added new medication of DULCOLAX 5 MG  TBEC (BISACODYL) Day before procedure take 2 at 3pm and 2 at 8pm. - Signed Added new medication of REGLAN 10 MG  TABS (METOCLOPRAMIDE HCL) As per prep instructions. - Signed Rx of MIRALAX   POWD (POLYETHYLENE GLYCOL 3350) As per prep  instructions.;  #255gm x 0;  Signed;  Entered by: Chales Abrahams CMA (AAMA);  Authorized by: Hilarie Fredrickson MD;  Method used: Electronically to [facility]  [phone]*, [address] [place], [place], [place]  [address], Ph: [phone] or [phone], Fax: [phone] Rx of DULCOLAX 5 MG  TBEC (BISACODYL) Day before procedure take 2 at 3pm and 2 at 8pm.;  #4 x 0;  Signed;  Entered by: Chales Abrahams CMA (AAMA);  Authorized by: Hilarie Fredrickson MD;  Method used: Electronically to [facility]  [phone]*, [address], [place], [place], [place]  [address], Ph: [phone] or [phone], Fax: [phone] Rx of REGLAN 10 MG  TABS (METOCLOPRAMIDE HCL) As per prep instructions.;  #2 x 0;  Signed;  Entered by: Chales Abrahams CMA (AAMA);  Authorized by: Hilarie Fredrickson MD;  Method used: Electronically to [facility]  [phone]*, [address], [place], [place], [place]  [address], Ph: [phone] or [phone], Fax: [phone]    Prescriptions: REGLAN 10 MG  TABS (METOCLOPRAMIDE HCL) As per prep instructions.  #2 x 0   Entered by:   Chales Abrahams CMA (AAMA)   Authorized by:   Hilarie Fredrickson MD   Signed by:   Chales Abrahams CMA (AAMA) on 07/10/2009   Method used:   Electronically to        [facility]  [phone]* (retail)       [address]       [place], [place]  [address]       Ph: [phone] or [phone]       Fax: [phone]   RxID:   [id number] DULCOLAX 5 MG  TBEC (BISACODYL) Day before  procedure take 2 at 3pm and 2 at 8pm.  #4 x 0   Entered by:   Chales Abrahams CMA (AAMA)   Authorized by:   Hilarie Fredrickson MD   Signed by:   Chales Abrahams CMA (AAMA) on 07/10/2009   Method used:   Electronically to        [facility]  [phone]* (retail)       [address]       [place], [place]  [address]       Ph: [phone] or [phone]       Fax: [phone]   RxID:   [id number] MIRALAX   POWD (POLYETHYLENE GLYCOL 3350) As per prep  instructions.  #255gm x 0   Entered by:   Chales Abrahams CMA (AAMA)   Authorized by:   Hilarie Fredrickson MD   Signed by:   Chales Abrahams CMA (AAMA) on 07/10/2009   Method used:   Electronically to        [facility]  [phone]* (retail)       [address]       [place], [place]  [address]  Ph: [phone] or [phone]       Fax: [phone]   RxID:   [id number]

## 2010-02-18 NOTE — Letter (Signed)
Summary: Patient [facility] Biopsy Results  [facility]  [address] [place], [place] [address]   Phone: [phone]  Fax: [phone]        August 20, 2009 MRN: [id number]    [facility] [address] [place], [place]  [address]    Dear Shannon Barajas,  I am pleased to inform you that the biopsies taken during your recent endoscopic examination did not show any evidence of celiac sprue.  Additional information/recommendations:  My nurse will contact you to shedule a CAPSULE ENDOSCOPY as we discussed previously.  Continue iron therapy for now   Please call us if you are having persistent problems or have questions about your condition that have not been fully answered at this time.  Sincerely,  Hilarie Fredrickson MD  This letter has been electronically signed by your physician.  Appended Document: Patient Notice-Endo Biopsy Results letter mailed

## 2010-02-18 NOTE — Letter (Signed)
Summary: [facility]  [facility]   Imported By: Maryln Gottron 07/09/2009 14:06:22  _____________________________________________________________________  External Attachment:    Type:   Image     Comment:   External Document

## 2010-02-18 NOTE — Progress Notes (Signed)
Summary: Status of FMLA Paperwork  Phone Note Call from Patient Call back at Work Phone [phone]   Caller: Patient Reason for Call: Acute Illness Summary of Call: Pt calling to check status of FMLA paperwork that was dropped off a few weeks ago. Initial call taken by: Trixie Dredge,  June 19, 2009 4:41 PM  Follow-up for Phone Call        left message on machine  for pt - compelted fml will ber ready after lunch today Follow-up by: Willy Eddy, LPN,  June 20, 6293 8:38 AM

## 2010-02-18 NOTE — Progress Notes (Signed)
Summary: Colon scheduled   Phone Note Call from Patient   Caller: patient's daughter Details for Reason: bleeding and bloating Summary of Call: pt's daughter called w/ same issues as earlier in evenning. spoke for >81min. Asked to call her mom, Shannon Barajas, and I did. Reports 3 weeks of intermittent BRBPR (generally small volume but increased recently). C/o abdominal bloating and ongoing contipation for which she takes Amitiza as needed about 3 / week. Seen in ER 2 days ago. Told negative CT and hg 10.6 (13+ 1 year ago). She wants a colonoscopy. I think that this is reasonable. Will set up for [facility] this Fri am 6-24. Miralax prep per pt preference. Nurse to call pt tomorrow and have pt come in for instructions. Initial call taken by: Hilarie Fredrickson MD,  July 09, 2009 10:34 PM  Follow-up for Phone Call        pt scheduled for colon at the hospital and message was left for her on her home phone.  she needs to have instructions today and meds reviewed. Follow-up by: Chales Abrahams CMA Duncan Dull),  July 10, 2009 9:21 AM  Additional Follow-up for Phone Call Additional follow up Details #1::        pt returned call and will come in today and get instructions Additional Follow-up by: Chales Abrahams CMA Duncan Dull),  July 10, 2009 10:19 AM  New Problems: RECTAL BLEEDING (ICD-569.3)   New Problems: RECTAL BLEEDING (ICD-569.3)

## 2010-02-18 NOTE — Procedures (Signed)
Summary: CAPSULE ENDOSCOPY REPORT   ORDERED BY: Yancey Flemings, MD READ BY: Yancey Flemings, MD   REASON FOR REFERRAL: 59YO FEMALE WITH IRON DEFICIENCY ANEMIA, RECENT NEGATIVE EGD AND COLON.  PROCEDURE INFO & FINDINGS: 1)INCOMPLETE STUDY, CAPSULE DID NOT REACH THE COLON, SUBOPTIMAL PREP 2)ONE SMALL POSSIBLE AVM 3)SUBMUCOSAL BULGE AT ONE HOUR 45 MINUTES, SMOOTH OF ? CLINICAL SIGNIFICANCE.  SUMMARY & RECOMMENDATIONS: 1.CT ENTEROGRAPHY TO EVALUATE UNVISUALIZED SMALL BOWEL AND INVESTIGATE SUBMUCOSAL BULGE. 2.RESUME IRON 3.KEEP FOLLOW UP WITH DR Marina Goodell  This report was created from the original endoscopy report, which was reviewed and signed by the above listed endoscopist.

## 2010-02-18 NOTE — Assessment & Plan Note (Signed)
Summary: FOLLOWUP - anemia and constipation    History of Present Illness Visit Type: Follow-up Visit Primary GI MD: Yancey Flemings MD Primary Provider: Peri Jefferson Chief Complaint: Post procedure, test & lab results; miralax not effective, Amitiza side effects  - abdominal pain History of Present Illness:   60 year old with a history of GERD complicated by peptic stricture and IBS (initially diarrhea, now constipation) who presents  for followup. She is accompanied by her husband. Recent history dates back to June 2011 when she was having problems with refractory constipation, abdominal pain, and recurrent rectal bleeding. She was seen in the ER at that time and underwent CT scan. This was unremarkable except for incidental hepatic cysts and borderline splenomegaly. CBC revealed hemoglobin of 10.5 and platelet count 105K which was new.Iron deficiency was found(ferritin 5.9)Colonoscopy with ileal intubation on 08-11-09 was normal except for mild diverticulosis and hemorrhoids.She was treated with suppositories and an iron supplement.Subsequent EGD was negative including duodenal biopsies. Concurrent esophageal dilation performed for dysphagia. Given the negative endoscopic workup, new onset iron deficiency anemia, and ongoing abdominal complaints, a capsule endoscopy was performed 08-28-09. The study was incomplete. No overwhelming findings, though possible AVM as well as questionable submucosal bulge. Further evaluate the small bowel, CT urography was performed September 13, 2009. Small bowel was unremarkable. Mild splenomegaly noted. She continues to complain of severe constipation. Amitiza causes abdominal pain requiring hydrocodone once or twice per week. MiraLax does not work.At her request, an appointment with Dr. Alycia Rossetti at [facility] is pending. Follow blood counts have revealed normalization of hemoglobin (12.6 yesterday). Mild thrombocytopenia persists.she complains of excessive belching. Inquires about  magnesium citrate and probiotic. Frustrated and worried.   GI Review of Systems    Reports abdominal pain.     Location of  Abdominal pain: RLQ.    Denies acid reflux, belching, bloating, chest pain, dysphagia with liquids, dysphagia with solids, heartburn, loss of appetite, nausea, vomiting, vomiting blood, weight loss, and  weight gain.      Reports constipation and  irritable bowel syndrome.     Denies anal fissure, black tarry stools, change in bowel habit, diarrhea, diverticulosis, fecal incontinence, heme positive stool, hemorrhoids, jaundice, light color stool, liver problems, rectal bleeding, and  rectal pain.    Current Medications (verified): 1)  Estrace 2 Mg Tabs (Estradiol) .Marland Kitchen.. 1 By Mouth Once Daily 2)  Gnp Vitamin D 1000 Unit Tabs (Cholecalciferol) .Marland Kitchen.. 1 By Mouth Once Daily 3)  Amitiza 24 Mcg  Caps (Lubiprostone) .... 2 By Mouth Once Daily 4)  Restoril 30 Mg  Caps (Temazepam) .... Two By Mouth Q Hs 5)  Multivitamins   Caps (Multiple Vitamin) .Marland Kitchen.. 1 Once Daily 6)  Nexium 40 Mg Cpdr (Esomeprazole Magnesium) .Marland Kitchen.. 1 Capsule By Mouth Once Daily 7)  Ferrous Sulfate 325 (65 Fe) Mg Tbec (Ferrous Sulfate) .... At Bedtime  Allergies (verified): 1)  ! Erythromycin (Erythromycin) 2)  ! Biaxin (Clarithromycin)  Past History:  Past Medical History: Reviewed history from 06/04/2008 and no changes required. Allergic rhinitis Irritable bowel syndrome Raynauds syndrome diarrhea, chronic hx of left sided weakness interstitial cystitis  Past Surgical History: Reviewed history from 05/22/2008 and no changes required. L sided weakness--no cause Lumpectomy EGD and Colon Flex 2004  ( Dr Marina Goodell)  Tubal Ligation  Family History: Reviewed history from 05/24/2008 and no changes required. Family History of Alcoholism/Addiction Family History Diabetes 1st degree relative ? Liver cancer grandfather  Social History: Reviewed history from 10/22/2006 and no changes  required. Married Never  Smoked Alcohol use-no Drug use-no  Review of Systems       The patient complains of allergy/sinus, fatigue, muscle pains/cramps, and sleeping problems.  The patient denies anemia, anxiety-new, arthritis/joint pain, back pain, blood in urine, breast changes/lumps, change in vision, confusion, cough, coughing up blood, depression-new, fainting, fever, headaches-new, hearing problems, heart murmur, heart rhythm changes, itching, menstrual pain, night sweats, nosebleeds, pregnancy symptoms, shortness of breath, skin rash, sore throat, swelling of feet/legs, swollen lymph glands, thirst - excessive , urination - excessive , urination changes/pain, urine leakage, vision changes, and voice change.    Vital Signs:  Patient profile:   60 year old female Height:      67 inches Weight:      130.25 pounds BMI:     20.47 Pulse rate:   68 / minute Pulse rhythm:   regular BP sitting:   100 / 66  (left arm) Cuff size:   regular  Vitals Entered By: June McMurray CMA Duncan Dull) (October 10, 2009 9:00 AM)  Physical Exam  General:  Well developed, well nourished, no acute distress. Head:  Normocephalic and atraumatic. Eyes:  PERRLA, no icterus. Mouth:  No deformity or lesions, dentition normal. Neck:  Supple; no masses or thyromegaly. Lungs:  Clear throughout to auscultation. Heart:  Regular rate and rhythm; no murmurs, rubs,  or bruits. Abdomen:  Soft, nontender and nondistended. No masses, hepatosplenomegaly or hernias noted. Normal bowel sounds. Msk:  Symmetrical with no gross deformities. Normal posture. Pulses:  Normal pulses noted. Extremities:  No clubbing, cyanosis, edema or deformities noted. Neurologic:  Alert and  oriented x4;  grossly normal neurologically. Skin:  Intact without significant lesions or rashes. Psych:  Alert and cooperative. Normal mood and affect.   Impression & Recommendations:  Problem # 1:  UNSPECIFIED IRON DEFICIENCY ANEMIA  (ICD-280.9) Iron deficiency anemia. Extensive GI workup as reviewed. Suspect problem related to small bowel AVMs. She has responded to iron.  Plan: #1. Continue iron supplement #2. Periodic CBC and ferritin  Problem # 2:  CONSTIPATION (ICD-564.00) Significant constipation. Difficulty with several agents as described. Anticipating tertiary care opinion  Plan: #1. Try to avoid hydrocodone as this is constipating #2. Ducolax tabs one or 2 daily p.r.n. prescribed #3. Magnesium citrate p.r.n. if needed #4. Check calcium and TSH #5. Keep scheduled appointment at [facility]  Problem # 3:  THROMBOCYTOPENIA (ICD-287.5) persistent mild thrombocytopenia with mild splenomegaly. Would like her to see a hematologist regarding this issue. She wishes to see Dr. Cyndie Chime. We will facilitate the appointment  Problem # 4:  SPLENOMEGALY (ICD-789.2) see above  Problem # 5:  GERD (ICD-530.81) GERD complicated by peptic stricture. Requiring permanent esophageal dilation. Currently asymptomatic post dilation on Nexium.  Plan: #1. Continue Nexium #2. Repeat esophageal dilation p.r.n. recurrent dysphagia  Problem # 6:  belching likely due to anxiety and aerophagia. She inquired about probiotic. Okay. Given 2 weeks of Align  Other Orders: [facility])  Patient Instructions: 1)  We have sent an electronic prescription for Dulcolax to your pharmacy for you to pick up. Please take 1-2 tablets by mouth once daily as needed. 2)  We have given you samples of Align, a probiotic (good bacteria) for you to take 1 capsule once daily. 3)  We are in the process of getting an appointment for you to see Dr Cyndie Chime for a hematology consult. We will be calling you soon with that appointment time. 4)  Copy sent to : Dr Darryll Capers, Dr Cyndie Chime 5)  The medication list was reviewed and reconciled.  All changed / newly prescribed medications were explained.  A complete medication list was  provided to the patient / caregiver. Prescriptions: DULCOLAX 5 MG TBEC (BISACODYL) Take 1-2 tablets by mouth once daily as needed  #60 x 6   Entered by:   Lamona Curl CMA (AAMA)   Authorized by:   Hilarie Fredrickson MD   Signed by:   Lamona Curl CMA (AAMA) on 10/10/2009   Method used:   Electronically to        [facility]  [phone]* (retail)       [address]       [place], [place]  [address]       Ph: [phone] or [phone]       Fax: [phone]   RxID:   [id number]

## 2010-02-18 NOTE — Miscellaneous (Signed)
Summary: capsule endo. order  Clinical Lists Changes  Problems: Added new problem of UNSPECIFIED IRON DEFICIENCY ANEMIA (ICD-280.9) Orders: Added new Test order of Capsule Endoscopy (Capsule Endoscopy) - Signed

## 2010-02-18 NOTE — Progress Notes (Signed)
Summary: Capsule endoscopy   Phone Note Outgoing Call   Summary of Call: Pt. scheduled for capsule endo . next Wed. .Expressed concern about Citrate of Mag. prep as says sometimes that works for her and sometimes it doesn't. Initial call taken by: Teryl Lucy RN,  August 20, 2009 2:52 PM  Follow-up for Phone Call        it will be ok for small bowel study Follow-up by: Hilarie Fredrickson MD,  August 20, 2009 3:21 PM     Appended Document: Capsule endoscopy Pt. ntfd.

## 2010-02-18 NOTE — Miscellaneous (Signed)
Summary: Appt. with Dr.Koch at [facility]  Clinical Lists Changes  Orders: Added new Test order of [facility]) - Signed

## 2010-02-18 NOTE — Letter (Signed)
Summary: EGD Instructions  [facility]  [address] [place], [place] [address]   Phone: [phone]  Fax: [phone]       Shannon Barajas    05/20/1950    MRN: [id number]       Procedure Day Dorna Bloom:  [name]  07/30/09     Arrival Time: 7:30AM     Procedure Time:  8:30AM     Location of Procedure:                    _ X _ [facility] (4th Floor)    PREPARATION FOR ENDOSCOPY   On 07/30/09 THE DAY OF THE PROCEDURE:  1.   No solid foods, milk or milk products are allowed after midnight the night before your procedure.  2.   Do not drink anything colored red or purple.  Avoid juices with pulp.  No orange juice.  3.  You may drink clear liquids until 6:30AM, which is 2 hours before your procedure.                                                                                                CLEAR LIQUIDS INCLUDE: Water Jello Ice Popsicles Tea (sugar ok, no milk/cream) Powdered fruit flavored drinks Coffee (sugar ok, no milk/cream) Gatorade Juice: apple, white grape, white cranberry  Lemonade Clear bullion, consomm, broth Carbonated beverages (any kind) Strained chicken noodle soup Hard Candy   MEDICATION INSTRUCTIONS  Unless otherwise instructed, you should take regular prescription medications with a small sip of water as early as possible the morning of your procedure.         OTHER INSTRUCTIONS  You will need a responsible adult at least 62 years of age to accompany you and drive you home.   This person must remain in the waiting room during your procedure.  Wear loose fitting clothing that is easily removed.  Leave jewelry and other valuables at home.  However, you may wish to bring a book to read or an iPod/MP3 player to listen to music as you wait for your procedure to start.  Remove all body piercing jewelry and leave at home.  Total time from sign-in until discharge is approximately 2-3 hours.  You should go home  directly after your procedure and rest.  You can resume normal activities the day after your procedure.  The day of your procedure you should not:   Drive   Make legal decisions   Operate machinery   Drink alcohol   Return to work  You will receive specific instructions about eating, activities and medications before you leave.    The above instructions have been reviewed and explained to me by   Wyona Almas RN  July 24, 2009 2:33 PM     I fully understand and can verbalize these instructions _____________________________ Date _________

## 2010-02-18 NOTE — Progress Notes (Signed)
Summary: Informed of capsule endoscopy results and plans   Phone Note Outgoing Call   Call placed by: Dr. Marina Goodell Call placed to: Patient Action Taken: Information Sent Details for Reason: information on recent study Summary of Call: I informed the patient via voicemail of the results of the capsule endoscopy. As the exam was incomplete and there was a question of a small submucosal bulge, I have recommended a CT enterography. My nurse will schedule this for the patient.  Elnita Maxwell, please schedule this patient for a CT enterography "iron deficiency anemia, bulge on capsule endoscopy, evaluate" Initial call taken by: Hilarie Fredrickson MD,  September 05, 2009 1:45 PM     Appended Document: Informed of capsule endoscopy results and plans Pt. scheduled for CT. angio at [facility] on 09/13/09 at 11:30 a.m.

## 2010-02-18 NOTE — Procedures (Signed)
Summary: Endoscopy Procedure Report/Dr. Yancey Flemings  Endoscopy Procedure Report/Dr. Yancey Flemings   Imported By: Maryln Gottron 09/02/2009 15:11:10  _____________________________________________________________________  External Attachment:    Type:   Image     Comment:   External Document

## 2010-02-18 NOTE — Progress Notes (Signed)
   Phone Note Outgoing Call   Summary of Call: Pt. contacted to schedule CT.enterography.Will be done on 09/13/09 at [facility] CT.Pt. given instructions via phone. Initial call taken by: Teryl Lucy RN,  September 06, 2009 11:02 AM

## 2010-02-18 NOTE — Progress Notes (Signed)
Summary: triage   Phone Note Call from Patient Call back at Home Phone [phone]   Caller: Patient Call For: Dr. Marina Goodell Reason for Call: Talk to Nurse Summary of Call: pt reporting more rectal bleeding over the weekend and was self-admitted to ER... pt states that ER doctor instructed her to be seen by GI this week Initial call taken by: Vallarie Mare,  July 08, 2009 8:35 AM  Follow-up for Phone Call        pt. all ready scheduled for appt. with Dr. Marina Goodell on 07/15/09 and she wishes to see him  If increase in symptoms occurr before then she will call back and will see NP. Follow-up by: Teryl Lucy RN,  July 08, 2009 10:31 AM

## 2010-02-18 NOTE — Progress Notes (Signed)
Summary: IBS -constipation is worse   Phone Note Call from Patient Call back at Home Phone [phone]   Call For: Dr Marina Goodell Reason for Call: Talk to Nurse Summary of Call: Is just no feeling well and would like to be seen this week. Initial call taken by: Leanor Kail [facility],  September 17, 2009 9:07 AM  Follow-up for Phone Call        Py. says her constipation is much worse.  She is taking Amitiza on Tues. Thurs and Sat. but it takes 3-4 hrs. after taking it to evacuate her bowels and she does not want to take it more often as she does not have time to sit on toilet that long.Continues to have bouts of lower abd. pain that sometimes lasts intermittently all day.Has a lot of burping and abdominal distention.Has absolutley no energy had to lay down for 3 hrs. yesterday.She stopped her Calcium about 4 wks. ago due to the constipation but takes 2  tablets of Vit.D 1000u each a.m.I reviwed in detail the results of her eneterography with her. Follow-up by: Teryl Lucy RN,  September 17, 2009 9:55 AM  Additional Follow-up for Phone Call Additional follow up Details #1::        add miralax daily. titrate to desired effect (she tried before, but not dillegently). see pcp re fatigue ( can not be attributed to improving hg) Additional Follow-up by: Hilarie Fredrickson MD,  September 17, 2009 10:01 AM    Additional Follow-up for Phone Call Additional follow up Details #2::    I discussed Dr.Jackey Housey's recommendations with pt.  which she will follow but is asking if you would refer her to a  a tertiary care center for further work up as she is wondering if she has a motility disorder. Follow-up by: Teryl Lucy RN,  September 17, 2009 11:26 AM  Additional Follow-up for Phone Call Additional follow up Details #3:: Details for Additional Follow-up Action Taken: refer her to [facility] With Dr. Almyra Deforest, Darcella Cheshire, Marvel Plan or whoever else has interest in motility problems Additional Follow-up by: Hilarie Fredrickson MD,  September 17, 2009 11:48 AM  Pt. contacted about referral for motility testing.She has started the Miralax as instructed but wants to know if you feel motility disorder  could be her problem and if so she would prefer to have motility testing ordered by you.  Appended Document: IBS -constipation is worse we do not do motility testing here. She would need to go to a tertiary care center  Appended Document: IBS -constipation is worse Will need referral to [facility] as [facility] is not in net work for [employer] so need name of Dr. there  Appended Document: IBS -constipation is worse I don't know who is there motility person. Call there, find out, and make the appointment. Thanks  Appended Document: IBS -constipation is worse Appt. made with Dr.Kenneth Alycia Rossetti  at [facility] for motility disorder evaluation on 12/25/09 at 2 p.m. Records faxed to [phone].

## 2010-02-18 NOTE — Miscellaneous (Signed)
Summary: [facility] Previsit/prep  Clinical Lists Changes  Observations: Added new observation of ALLERGY REV: Done (07/24/2009 14:03)

## 2010-02-18 NOTE — Progress Notes (Signed)
   Phone Note Outgoing Call   Summary of Call: Pt. made aware of CT enterography. Initial call taken by: Teryl Lucy RN,  September 06, 2009 11:05 AM  New Problems: NONSPECIFIC ABN FINDING RAD & OTH EXAM GI TRACT (ICD-793.4)   New Problems: NONSPECIFIC ABN FINDING RAD & OTH EXAM GI TRACT (ICD-793.4)

## 2010-02-18 NOTE — Letter (Signed)
Summary: [facility]  [facility]   Imported By: Maryln Gottron 12/02/2009 12:51:13  _____________________________________________________________________  External Attachment:    Type:   Image     Comment:   External Document

## 2010-02-18 NOTE — Progress Notes (Signed)
Summary: results   Phone Note Call from Patient Call back at Home Phone [phone]   Caller: Patient Call For: Dr Marina Goodell Reason for Call: Talk to Nurse Summary of Call: Patient wants ct results from Friday. Initial call taken by: Tawni Levy,  September 16, 2009 2:43 PM  Follow-up for Phone Call        pt results are ready but has not flowed over to E chart or EMR.  [facility] radiology has no idea how to fix the problem so I have called EMR and they are faxing a copy to [place]. Follow-up by: Chales Abrahams CMA Duncan Dull),  September 16, 2009 3:09 PM  Additional Follow-up for Phone Call Additional follow up Details #1::        I spoke to [facility] with EMR and she is sending a request to the interface to fix the problem copy on Dr Marina Goodell desk Additional Follow-up by: Chales Abrahams CMA Duncan Dull),  September 16, 2009 3:21 PM     Appended Document: results please let the patient know that CT enterography looks fine. No small bowel lesions. I want her to stay on her iron therapy and have followup CBC and ferritin level in 4 weeks. I would like to see her back for office followup in about 6 weeks.  Appended Document: results pt aware has appt and labs scheduled

## 2010-02-18 NOTE — Letter (Signed)
Summary: [facility]  [facility]   Imported By: Maryln Gottron 11/07/2009 13:45:24  _____________________________________________________________________  External Attachment:    Type:   Image     Comment:   External Document

## 2010-02-18 NOTE — Progress Notes (Signed)
Summary: Triage   Phone Note Call from Patient Call back at Home Phone [phone]   Caller: Patient Call For: Dr. Marina Goodell Reason for Call: Talk to Nurse Summary of Call: pt. has had a episode with her IBS...had some blood in stool over the weekend Initial call taken by: Karna Christmas,  Jun 03, 2009 1:37 PM  Follow-up for Phone Call        Message left to call back    Teryl Lucy RN  Jun 03, 2009 2:09 PM  Pt. said since last Sunday-05/26/09 she has been having gnawing lower quadrant pain more so in middle quadrant .Describes it as the same pain as before  but says it just has never lasted this long. Is being treated with Amitiza by pcp. for her IBS.Marland KitchenDid see some BRB in toilet x1.Offered appt. with PA for tomorrow which she refused saying she wishs to wait to see DR.Marina Goodell so given next available appt. which is 07/15/09 and she was placed on his cx. list.Advised to call back if symptoms increase before that time. Follow-up by: Teryl Lucy RN,  Jun 03, 2009 2:29 PM

## 2010-02-18 NOTE — Procedures (Signed)
Summary: Instructions for procedure/[facility]  Instructions for procedure/[facility]   Imported By: Sherian Rein 08/26/2009 10:13:54  _____________________________________________________________________  External Attachment:    Type:   Image     Comment:   External Document

## 2010-02-18 NOTE — Progress Notes (Signed)
Summary: early refill  Phone Note Call from Patient   Caller: Patient Call For: Stacie Glaze MD Reason for Call: Talk to Nurse, Referral Summary of Call: Pt. states she occ takes an extra Restoril when she wants to sleep late and needs early refill to [facility]. [phone] Initial call taken by: Lynann Beaver CMA,  April 01, 2009 9:04 AM  Follow-up for Phone Call        may refil now but instruct pt that she cannot exceed 60 in a 30 day period EVER Follow-up by: Stacie Glaze MD,  April 01, 2009 11:11 AM  Additional Follow-up for Phone Call Additional follow up Details #1::        Pharmacy will not refill the new prescription that was sent in because it has the same directions.  She wants #6 called in today, and she will pay for them.? Cannot call her at work, and she is leaving messages. Additional Follow-up by: Lynann Beaver CMA,  April 02, 2009 9:36 AM    Additional Follow-up for Phone Call Additional follow up Details #2::    left message on machine cannot send in 6 additional- per dr Hamilton Capri TAKE OVER 2 A NIGHT- DANGEROUS TO TAKE THAT MANY-will have to wait until time for refill Follow-up by: Willy Eddy, LPN,  April 02, 2009 9:57 AM  Prescriptions: RESTORIL 30 MG  CAPS (TEMAZEPAM) two by mouth q HS  #180 x 0   Entered by:   Willy Eddy, LPN   Authorized by:   Stacie Glaze MD   Signed by:   Willy Eddy, LPN on 91/47/8295   Method used:   Telephoned to ...       [facility]* (retail)       [address]       [address] Shipping/mailing       [place], [place]  [address]       Ph: [phone]       Fax: [phone]   RxID:   [id number]  LMTCB

## 2010-02-18 NOTE — Procedures (Signed)
Summary: 280.9 Capsule Endoscopy  Patient here today for capsule endoscopy for Dr. Marina Goodell .  Pt verbalized understanding of all verbal and written instructions.  Pt tolerated well.  Lot # [id number]   25  exp 2012/07 .

## 2010-02-18 NOTE — Letter (Signed)
Summary: [facility]  [facility]   Imported By: Lester Meade 11/29/2009 10:50:39  _____________________________________________________________________  External Attachment:    Type:   Image     Comment:   External Document

## 2010-02-18 NOTE — Procedures (Signed)
Summary: Colonoscopy Report/[facility]  Colonoscopy Report/[facility]   Imported By: Maryln Gottron 08/08/2009 14:56:22  _____________________________________________________________________  External Attachment:    Type:   Image     Comment:   External Document

## 2010-02-18 NOTE — Letter (Signed)
Summary: [facility] Instructions  [facility]  [address] [place], [place] [address]   Phone: [phone]  Fax: [phone]       Shannon Barajas    62/31/1952    MRN: [id number]       Procedure Day /Date:07/12/09  [name]     Arrival Time: 800 am     Procedure Time:900 am     Location of Procedure:                     X  [facility] ( Outpatient Registration)      PREPARATION FOR COLONOSCOPY WITH MIRALAX  Starting 5 days prior to your procedure today do not eat nuts, seeds, popcorn, corn, beans, peas,  salads, or any raw vegetables.  Do not take any fiber supplements (e.g. Metamucil, Citrucel, and Benefiber). ____________________________________________________________________________________________________   THE DAY BEFORE YOUR PROCEDURE         DATE: 07/11/09 DAY: THURS  1   Drink clear liquids the entire day-NO SOLID FOOD  2   Do not drink anything colored red or purple.  Avoid juices with pulp.  No orange juice.  3   Drink at least 64 oz. (8 glasses) of fluid/clear liquids during the day to prevent dehydration and help the prep work efficiently.  CLEAR LIQUIDS INCLUDE: Water Jello Ice Popsicles Tea (sugar ok, no milk/cream) Powdered fruit flavored drinks Coffee (sugar ok, no milk/cream) Gatorade Juice: apple, white grape, white cranberry  Lemonade Clear bullion, consomm, broth Carbonated beverages (any kind) Strained chicken noodle soup Hard Candy  4   Mix the entire bottle of Miralax with 64 oz. of Gatorade/Powerade in the morning and put in the refrigerator to chill.  5   At 3:00 pm take 2 Dulcolax/Bisacodyl tablets.  6   At 4:30 pm take one Reglan/Metoclopramide tablet.  7  Starting at 5:00 pm drink one 8 oz glass of the Miralax mixture every 15-20 minutes until you have finished drinking the entire 64 oz.  You should finish drinking prep around 7:30 or 8:00 pm.  8   If you are nauseated, you may take the 2nd Reglan/Metoclopramide  tablet at 6:30 pm.        9    At 8:00 pm take 2 more DULCOLAX/Bisacodyl tablets.     THE DAY OF YOUR PROCEDURE      DATE: 07/12/09 DAY:FRI  You may drink clear liquids until 5 am  (4 HOURS BEFORE PROCEDURE).   MEDICATION INSTRUCTIONS  Unless otherwise instructed, you should take regular prescription medications with a small sip of water as early as possible the morning of your procedure.           OTHER INSTRUCTIONS  You will need a responsible adult at least 62 years of age to accompany you and drive you home.   This person must remain in the waiting room during your procedure.  Wear loose fitting clothing that is easily removed.  Leave jewelry and other valuables at home.  However, you may wish to bring a book to read or an iPod/MP3 player to listen to music as you wait for your procedure to start.  Remove all body piercing jewelry and leave at home.  Total time from sign-in until discharge is approximately 2-3 hours.  You should go home directly after your procedure and rest.  You can resume normal activities the day after your procedure.  The day of your procedure you should not:   Drive  Make legal decisions   Operate machinery   Drink alcohol   Return to work  You will receive specific instructions about eating, activities and medications before you leave.   The above instructions have been reviewed and explained to me by   Chales Abrahams CMA Duncan Dull)  July 10, 2009 9:29 AM     I fully understand and can verbalize these instructions _____________________________ Date _______

## 2010-02-20 NOTE — Letter (Signed)
Summary: [facility]  [facility]   Imported By: Maryln Gottron 01/16/2010 09:49:24  _____________________________________________________________________  External Attachment:    Type:   Image     Comment:   External Document

## 2010-03-07 ENCOUNTER — Telehealth: Payer: Self-pay | Admitting: Internal Medicine

## 2010-03-10 ENCOUNTER — Encounter: Payer: Self-pay | Admitting: Internal Medicine

## 2010-03-11 ENCOUNTER — Other Ambulatory Visit: Payer: Self-pay | Admitting: Internal Medicine

## 2010-03-18 NOTE — Letter (Signed)
Summary: Appt Reminder 2  [facility]  [address]   [place], [place] [address]   Phone: [phone]  Fax: [phone]        March 10, 2010 MRN: [id number]    [facility] [address] [place], [place]  [address]    To Whom It May Concern,   Mrs. Overholser is having two procedures done by Dr. Alycia Rossetti at [facility]. These procedures are not performed by [facility]. She will need to have them performed by Dr. Alycia Rossetti and have them treated as "in-network" by the insurance company. The two procedures that she is having done are the "Smart Pill Study" and an Anal/Rectal Manometry.    Thank you so much for your help with these procedures.  Sincerely,         Wilhemina Bonito. Eda Keys., M.D.

## 2010-03-18 NOTE — Progress Notes (Signed)
Summary: Speak with nurse   Phone Note Call from Patient Call back at Home Phone [phone]   Caller: Patient Call For: Dr. Marina Goodell Reason for Call: Talk to Nurse Summary of Call: Patient needs documentation from Dr. Marina Goodell to prove the procedure we referred her for at [facility] is not done through [facility] since she is a [employer] employee  Initial call taken by: [place] Johnson,  March 07, 2010 9:43 AM  Follow-up for Phone Call        Spoke with patient and she is scheduled to have a "smart pill procedure" done at [facility] with Dr. Alycia Rossetti. She works at [employer] and needs a letter from Dr. Marina Goodell stating that this procedure is not done at [facility]. She needs this to send to her insurance company so they will pay tier1 benefits instead of tier2.  Follow-up by: Selinda Michaels RN,  March 07, 2010 9:55 AM  Additional Follow-up for Phone Call Additional follow up Details #1::        Draft this letter for her and sign it for me Additional Follow-up by: Hilarie Fredrickson MD,  March 07, 2010 1:00 PM    Additional Follow-up for Phone Call Additional follow up Details #2::    What is the specific name of the test she is having done?   Teryl Lucy RN  March 07, 2010 2:02 PM  I don't know. Ask her to get that info for you. Hilarie Fredrickson MD  March 07, 2010 2:28 PM  Message left for pt. to call Bonita Quin on Monday with specific test name Dr.Koch is doing. Follow-up by: Teryl Lucy RN,  March 07, 2010 2:41 PM  Additional Follow-up for Phone Call Additional follow up Details #3:: Details for Additional Follow-up Action Taken: Patient states she is having a "Smart Pill Study" and an Anal/Rectal Manometry at [facility].  Letter mailed to the patient. Additional Follow-up by: Selinda Michaels RN,  March 10, 2010 10:34 AM

## 2010-03-20 ENCOUNTER — Telehealth: Payer: Self-pay | Admitting: Internal Medicine

## 2010-03-27 NOTE — Progress Notes (Signed)
Summary: Triage / BAD EXPERIENCE AT [facility]  Medications Added REGLAN 10 MG TABS (METOCLOPRAMIDE HCL) Take 1 by mouth QID before meals and at bedtime       Phone Note Call from Patient Call back at Home Phone [phone]   Caller: Patient Call For: Dr. Marina Goodell Reason for Call: Talk to Nurse Summary of Call: Micah Flesher to [facility] last week and did a Smart Pill procedure and could not swallow the pill. Wants to discuss Initial call taken by: Karna Christmas,  March 20, 2010 11:36 AM  Follow-up for Phone Call        Patient states that she went to [facility] Friday and tried to swallow the pill for the "Smart Pill Study." Patient was unable to swallow the pill. She was very unhappy with [facility] and does not plan to go back.  She wants to know if there is some sort of test that Dr. Marina Goodell could order to see if she has problems with gastric motility. She would also like to know if we could give her some medication to help with her constipation-pt states she has heard that Reglan might help. Patient states she has IBS/constipation and that she has to take Mag Citrate 3 times a week in order to go to the bathroom. Dr. Marina Goodell please advise. Follow-up by: Selinda Michaels RN,  March 20, 2010 11:46 AM  Additional Follow-up for Phone Call Additional follow up Details #1::        NOT SURE WHAT ELSE TO RECOMMEND. SHE HAS TRIED ALL OF MY RECOMMENDATIONS. MAYBE WE CAN GET HER IN TO SEE DR Marvel Plan OR SCARLETT ([facility]), IF SHE WANTS Additional Follow-up by: Hilarie Fredrickson MD,  March 20, 2010 2:59 PM    Additional Follow-up for Phone Call Additional follow up Details #2::    Spoke with patient and let her know about possible referral to [facility]. Patient states that [facility] is not "in network" with her insurance co. and she cannot afford to go there. Patient requests a "trial" of Reglan to see if it would help her. Willing to make an appointment to come and discuss things if she needs to. Dr.  Marina Goodell please advise. Follow-up by: Selinda Michaels RN,  March 20, 2010 3:19 PM  Additional Follow-up for Phone Call Additional follow up Details #3:: Details for Additional Follow-up Action Taken: NOT SURE WHAT ELSE TO OFFER (I'M TRULY SORRY). rEGLAN UNLIKELY TO WORK AND HAS POTENTIAL SIGNIFICANT SIDE EFFECTS.  IF SHE WANTS A SHORT TRIAL OF 10MG  QID X [id number] SEE IF THIS HELPS, OK TO TRY. Hilarie Fredrickson MD  March 20, 2010 3:36 PM   New/Updated Medications: REGLAN 10 MG TABS (METOCLOPRAMIDE HCL) Take 1 by mouth QID before meals and at bedtime Prescriptions: REGLAN 10 MG TABS (METOCLOPRAMIDE HCL) Take 1 by mouth QID before meals and at bedtime  #56 x 0   Entered by:   Selinda Michaels RN   Authorized by:   Hilarie Fredrickson MD   Signed by:   Selinda Michaels RN on 03/20/2010   Method used:   Electronically to        [facility]* (retail)       [address]       [address] Shipping/mailing       [place], [place]  [address]       Ph: [phone]       Fax: [phone]   RxID:  [id number]   Appended Document: Triage / BAD EXPERIENCE AT [facility] Patient aware of Dr. Lamar Sprinkles recommendations and rx for Reglan sent in for patient.

## 2010-04-02 LAB — POCT HEMOGLOBIN-HEMACUE: Hemoglobin: 13.8 g/dL (ref 12.0–15.0)

## 2010-04-06 LAB — DIFFERENTIAL
Basophils Relative: 1 % (ref 0–1)
Eosinophils Absolute: 0 10*3/uL (ref 0.0–0.7)
Eosinophils Relative: 0 % (ref 0–5)
Lymphs Abs: 2.3 10*3/uL (ref 0.7–4.0)
Monocytes Relative: 8 % (ref 3–12)

## 2010-04-06 LAB — BASIC METABOLIC PANEL
BUN: 25 mg/dL — ABNORMAL HIGH (ref 6–23)
CO2: 27 mEq/L (ref 19–32)
Chloride: 107 mEq/L (ref 96–112)
GFR calc non Af Amer: 59 mL/min — ABNORMAL LOW (ref >60)
Glucose, Bld: 86 mg/dL (ref 70–99)
Potassium: 4 mEq/L (ref 3.5–5.1)
Sodium: 140 mEq/L (ref 135–145)

## 2010-04-06 LAB — CBC
HCT: 31.6 % — ABNORMAL LOW (ref 36.0–46.0)
MCHC: 33.1 g/dL (ref 30.0–36.0)
MCV: 86 fL (ref 78.0–100.0)
Platelets: 105 10*3/uL — ABNORMAL LOW (ref 150–400)
WBC: 5.3 10*3/uL (ref 4.0–10.5)

## 2010-04-06 LAB — PROTIME-INR: Prothrombin Time: 14.6 seconds (ref 11.6–15.2)

## 2010-04-06 LAB — URINALYSIS, ROUTINE W REFLEX MICROSCOPIC
Hgb urine dipstick: NEGATIVE
Nitrite: NEGATIVE
Protein, ur: NEGATIVE mg/dL
Specific Gravity, Urine: 1.009 (ref 1.005–1.030)
Urobilinogen, UA: 0.2 mg/dL (ref 0.0–1.0)

## 2010-04-06 LAB — HEPATIC FUNCTION PANEL
ALT: 22 U/L (ref 0–35)
AST: 25 U/L (ref 0–37)
Alkaline Phosphatase: 117 U/L (ref 39–117)
Bilirubin, Direct: 0.1 mg/dL (ref 0.0–0.3)
Indirect Bilirubin: 0.5 mg/dL (ref 0.3–0.9)

## 2010-04-06 LAB — URINE MICROSCOPIC-ADD ON

## 2010-05-01 LAB — URINE CULTURE: Colony Count: 80000

## 2010-05-01 LAB — POCT URINALYSIS DIP (DEVICE)
Bilirubin Urine: NEGATIVE
Hgb urine dipstick: NEGATIVE
Ketones, ur: NEGATIVE mg/dL
Protein, ur: NEGATIVE mg/dL
pH: 5.5 (ref 5.0–8.0)

## 2010-05-02 ENCOUNTER — Telehealth: Payer: Self-pay | Admitting: Internal Medicine

## 2010-05-02 NOTE — Telephone Encounter (Signed)
The concern is that it may get stuck. Can it be broken up or given in other forms? Maybe her pharmacist can help

## 2010-05-02 NOTE — Telephone Encounter (Signed)
Patient has read abut Flax seed oil helping IBS, constipation but it is also contraindicated for esophageal stricture. She wants to know it is contraindicated because it is a large pill or because it cause problems/make the esophageal stricture worse.Marland Kitchen Please, advise.

## 2010-05-02 NOTE — Telephone Encounter (Signed)
Patient given Dr. Lamar Sprinkles recommendations. She will check with her pharmacist.

## 2010-05-05 LAB — CBC
Platelets: 112 10*3/uL — ABNORMAL LOW (ref 150–400)
WBC: 6.4 10*3/uL (ref 4.0–10.5)

## 2010-05-14 ENCOUNTER — Encounter: Payer: Self-pay | Admitting: *Deleted

## 2010-05-29 ENCOUNTER — Telehealth: Payer: Self-pay | Admitting: Internal Medicine

## 2010-05-29 NOTE — Telephone Encounter (Signed)
Pt states that her IBS is acting up. States that she was taking Mag Citrate 3 times a week and it has stopped working. She is taking dulcolax with the Mag Citrate but is going very little. Pt states she did a colon prep on Sunday and mixed a whole bottle of Miralax in a bottle of gatorade but hardly went at all. Pt states she had spoken with you in the past about trying Reglan and she was nervous because of the side effects. She wants to know if that might be an option for her. Dr. Marina Goodell please advise.

## 2010-05-29 NOTE — Telephone Encounter (Signed)
Shannon Barajas is really not indicated for chronic constipation and is unlikely to work. I am so sorry, but I am not sure what else to offer at this point. I know that she was seen at a tertiary care center, I believe [facility], for this problem. She might call over there to see if they have any other recommendations. If so, please keep me posted because of what like to know.

## 2010-05-29 NOTE — Telephone Encounter (Signed)
Pt aware of Dr. Lamar Sprinkles recommendations. She states she will keep her appt with Dr. Marina Goodell on Wed. 06/04/10.

## 2010-06-02 ENCOUNTER — Other Ambulatory Visit: Payer: Self-pay | Admitting: Internal Medicine

## 2010-06-03 NOTE — Assessment & Plan Note (Signed)
[facility]                         GASTROENTEROLOGY OFFICE NOTE   Barajas, Shannon Barajas                    MRN:          [id number]  DATE:02/11/2007                            DOB:          1951-01-14    HISTORY:  This is a 60 year old female with a history of esophageal  stricture requiring esophageal dilation and irritable bowel syndrome,  more recently with a tendency toward constipation.  She was last  evaluated in the office April 07, 2005 for recurrent dysphagia with  transient food impaction and constipation.  For constipation GlycoLax  was recommended.  She states this was not effective.  She is not certain  how much she took or for how long.  More recently she has been taking  Amitiza once daily as prescribed by Dr. Lovell Sheehan.  She states her bowels  are quite satisfactory with this regimen.  She denies medication side  effects.  She did undergo esophageal dilation to a maximal diameter of  19 millimeters after triamcinolone injection therapy April 17, 2005.  She states that she has really done well in terms of her swallowing  until the past 3 to 4 months where she has had recurrent intermittent  solid-food dysphagia to items such as meat and bread.  She felt like the  use of triamcinolone was superior to prior dilations.  She presents  today with a number of complaints.  First, she reports having had severe  episode of rectal bleeding in recent weeks.  She describes large volume  of bright red blood in the toilet on 2 occasions within 1 day.  She also  complains of ongoing constipation, though, as stated above, is  responsive to Amitiza.  The patient did undergo a full colonoscopy for  diarrhea in May of 1999.  Random biopsies were negative and she was  diagnosed with diarrhea-predominant irritable bowel syndrome.  For  complaints of rectal bleeding, she underwent flexible sigmoidoscopy in  2004.  This was negative.  She is interested in full  colonoscopy to have  her screening up to date.  Also to evaluate the bleeding.  Finally, she  feels like she needs repeat esophageal dilation for the dysphagia.  Since her last visit, she has had no significant interval medical  problems.   PAST MEDICAL HISTORY:  As above.   PAST SURGICAL HISTORY:  Tubal ligation.   ALLERGIES:  The patient is intolerant to BIAXIN and ERYTHROMYCIN.   CURRENT MEDICATIONS:  1. Prempro 0.65 mg daily.  2. Amitiza 24 mcg daily.  3. Temazepam 30 mg daily.  4. Aspirin 81 mg daily.  5. Calcium with vitamin D.  6. Vitamin E.  7. Vitamin C.  8. Multivitamin.  9. Vitamin D.  10.She also uses Tylenol p.r.n.  11.Aleve p.r.n.   FAMILY HISTORY:  Grandfather with colon cancer, mother with colon  polyps, father with alcoholic cirrhosis.   SOCIAL HISTORY:  The patient is married with 2 daughters.  She is a high  Garment/textile technologist, works as a Programme researcher, broadcasting/film/video at [employer].  Does not smoke or use alcohol.   REVIEW OF SYSTEMS:  Per  diagnostic evaluation form.   PHYSICAL EXAMINATION:  Pleasant, well-appearing female in no acute  distress.  Her blood pressure is 98/54, heart rate is 80 and regular, weight is  140.6 pounds.  HEENT:  Sclerae anicteric.  LUNGS:  Clear.  HEART:  Regular.  ABDOMEN:  Soft without tenderness, mass or hernia.  Good bowel sounds  heard.  RECTAL EXAM:  Deferred.  EXTREMITIES:  Without edema.   IMPRESSION:  1. History of esophageal stricture requiring esophageal dilation, now      with several months of recurrent dysphagia to solids since her last      dilation in March of 2007.  It is almost certainly due to recurrent      stricture.  2. History of irritable bowel, initially diarrhea predominant, now      constipation predominant.  Symptoms responsive to, but require,      once daily Amitiza.  3. Recent problems with moderate rectal bleeding.  There was no      associated rectal pain.  Question internal hemorrhoids.  4.  Family history of colon cancer and colon polyps.   RECOMMENDATIONS:  1. Upper endoscopy with esophageal dilation.  The nature of the      procedure, as well as the risks, benefits, and alternatives were      reviewed.  She understood and agreed to proceed.  We may consider      using triamcinolone as previous.  2. Colonoscopy to evaluate rectal bleeding as well as provide      screening in a patient with a family history.  The nature of the      procedure as well as the risks, benefits, and alternatives have      been reviewed.  She understood and agreed to proceed.  3. Ongoing general medical care with Dr. Lovell Sheehan.     Wilhemina Bonito. Marina Goodell, MD  Electronically Signed    JNP/MedQ  DD: 02/11/2007  DT: 02/11/2007  Job #: [id number]   cc:   Stacie Glaze, MD

## 2010-06-03 NOTE — Op Note (Signed)
NAMELOURDEZ, MCGAHAN             ACCOUNT NO.:  [id number]   MEDICAL RECORD NO.:  [id number]          PATIENT TYPE:  AMB   LOCATION:  SDC                           FACILITY:  [facility]   PHYSICIAN:  Freddy Finner, M.D.   DATE OF BIRTH:  [birth year]   DATE OF PROCEDURE:  DATE OF DISCHARGE:                               OPERATIVE REPORT   PREOPERATIVE DIAGNOSIS:  Suspected endometrial polyp.   POSTOPERATIVE DIAGNOSIS:  Large endometrial polyp.   OPERATIVE PROCEDURE:  Hysteroscopy, dilation and curettage and resection  of polyp.   ANESTHESIA:  General.   ESTIMATED INTRAOPERATIVE BLOOD LOSS:  10 cc.   SORBITOL DEFICIT:  100 cc.   INTRAOPERATIVE COMPLICATIONS:  None.   The patient is a 60 year old who had an episode of postmenopausal  bleeding and pelvic ultrasound revealed thickening of the endometrium to  12.8 mm with findings most consistent with endometrial polyp.  For that  reason she was scheduled and admitted to the hospital today.  She  received a bolus of Ancef preoperatively.  She was brought to the  operating room, placed under adequate general anesthesia, placed in the  dorsolithotomy position.  Betadine prep of mons, perineum and vagina was  carried out in the usual fashion.  Sterile drapes were applied.  Bivalve  speculum was introduced.  Cervix was grasped with a single-tooth  tenaculum.  Cervix was progressively dilated to 23 with [facility] dilators.  Uterus sounded to 7.5 cm.  A 12-1/2 degree hysteroscope was then used  with sorbitol as a distending medium.  Inspection did reveal a large  endometrial polyp.  Photographs were made before and after resection of  the polyp and are retained in the office record.  Using Randall-Stone  forceps, small ring forceps and a curette the polyp was completely  excised and the endometrium completely sampled as confirmed by  hysteroscopic exam.  At this point the instruments removed.  The  procedure was terminated.  The patient was  awakened and taken to  recovery in good condition.  She will be given routine outpatient  surgical instructions.  She is to follow up in the office in  approximately 1 week.  She has Vicodin and Phenergan to be taken for  pain and nausea      W. Varney Baas, M.D.  Electronically Signed     WRN/MEDQ  D:  01/27/2008  T:  01/27/2008  Job:  [id number]

## 2010-06-04 ENCOUNTER — Ambulatory Visit (INDEPENDENT_AMBULATORY_CARE_PROVIDER_SITE_OTHER): Payer: Commercial Managed Care - PPO | Admitting: Internal Medicine

## 2010-06-04 ENCOUNTER — Encounter: Payer: Self-pay | Admitting: Internal Medicine

## 2010-06-04 DIAGNOSIS — K222 Esophageal obstruction: Secondary | ICD-10-CM

## 2010-06-04 DIAGNOSIS — R142 Eructation: Secondary | ICD-10-CM

## 2010-06-04 DIAGNOSIS — R141 Gas pain: Secondary | ICD-10-CM

## 2010-06-04 DIAGNOSIS — K219 Gastro-esophageal reflux disease without esophagitis: Secondary | ICD-10-CM

## 2010-06-04 DIAGNOSIS — R143 Flatulence: Secondary | ICD-10-CM

## 2010-06-04 DIAGNOSIS — K5900 Constipation, unspecified: Secondary | ICD-10-CM

## 2010-06-04 MED ORDER — ALIGN 4 MG PO CAPS: 4.0000 mg | ORAL_CAPSULE | Freq: Every day | ORAL | Status: DC

## 2010-06-04 MED ORDER — LUBIPROSTONE 8 MCG PO CAPS: 24.0000 ug | ORAL_CAPSULE | Freq: Two times a day (BID) | ORAL | Status: DC

## 2010-06-04 NOTE — Patient Instructions (Signed)
Amitiza and Align samples given to you.

## 2010-06-04 NOTE — Progress Notes (Signed)
HISTORY OF PRESENT ILLNESS:  Shannon Barajas is a 60 y.o. female with the below listed medical history who is followed in this office for GERD, peptic stricture, and chronic constipation with bloating. She has also been seen for iron deficiency anemia. Prior evaluation last year with colonoscopy, upper endoscopy (with esophageal dilation for stricture), capsule endoscopy (incomplete study), and CT enterograpthy. No significant pathology found. She was also seen a hematologist regarding mild thrombocytopenia. She continues to follow with him. Because of great difficulties with constipation, she was referred to [facility] and saw Dr. Beverly Gust on 12/25/2009. She was subsequently set up for "smart pill" to evaluate her motility. However, due to the size of the pill, she did not feel that she could swallow it. That was the end of her interaction with [facility] as she describes "terrible experience". She did not followup with Dr. Alycia Rossetti thereafter and has no interest in reestablishing. I also suggested, then, to see motility experts at [facility]. She states that she cannot due to there being out of her insurance network and an extremely high deductable. She now comes today wanting assistance with her chronic constipation. She had been using magnesium citrate 3 times weekly. She has tried multiple other agents. She inquires about retrying Amitiza. She also requires about other agent such as lactulose. She also has significant bloating. She seems to have significant stress from work. In terms of GERD, currently on Nexium without symptoms. No significant recurrent dysphagia   REVIEW OF SYSTEMS:  All non-GI ROS negative except for fatigue and insomnia.  Past Medical History  Diagnosis Date  . IBS (irritable bowel syndrome)   . Raynaud's syndrome   . Hemorrhoids   . Splenomegaly   . Hiatal hernia     small   . Stricture and stenosis of esophagus   . GERD (gastroesophageal reflux  disease)   . H/O: iron deficiency anemia   . Thrombocytopenia, unspecified   . Unspecified constipation   . Insomnia, unspecified   . Other dysphagia   . Chronic interstitial cystitis   . Diverticulosis     mild     Past Surgical History  Procedure Date  . Breast lumpectomy   . Tubal ligation     Social History Shannon Barajas  reports that she has never smoked. She has never used smokeless tobacco. She reports that she does not drink alcohol or use illicit drugs.  family history includes Colon cancer in her maternal grandfather; Diabetes in her maternal grandmother; and Liver cancer in an unspecified family member.  Allergies  Allergen Reactions  . Clarithromycin   . Erythromycin     REACTION: aggrevates IBS       PHYSICAL EXAMINATION:  Vital signs: BP 118/62  Pulse 88  Ht 5\' 7"  (1.702 m)  Wt 132 lb (59.875 kg)  BMI 20.67 kg/m2 General: Well-developed, well-nourished, no acute distress HEENT: Sclerae are anicteric, conjunctiva pink. Oral mucosa intact Lungs: Clear Heart: Regular Abdomen: soft, nontender, nondistended, no obvious ascites, no peritoneal signs, normal bowel sounds. No organomegaly. Extremities: No edema Psychiatric: alert and oriented x3. Cooperative     ASSESSMENT:  #1. Constipation. Ongoing. Incomplete evaluation at [facility]. #2. Bloating #3. GERD with peptic stricture status post dilation July 2011 #4. Iron deficiency anemia without cause. Extensive workup last year as outlined  PLAN:  #1. Amitiza 8 mcg twice a day. Multiple samples given. May titrate. If this is helpful, she will contact the office and we will prescribe the  effective dosage #2. Probiotic Align one daily. 3 weeks samples given #3. Continue PPI #4. Esophageal dilation as needed for recurrent dysphagia #5. Consult with GI research nurse regarding current constipation study

## 2010-06-06 NOTE — Discharge Summary (Signed)
Shannon Barajas. [facility]  Patient:    Shannon Barajas, Shannon Barajas Visit Number: [id number] MRN: [id number]          Service Type: MED Location: 3000 [id number] Attending Physician:  Shannon Barajas Dictated by:   Shannon Barajas, P.A. Admit Date:  01/25/2001 Discharge Date: 01/27/2001   CC:         Shannon Barajas, M.D.                           Discharge Summary  DISCHARGE DIAGNOSES: 1. Left-sided weakness. 2. Cerebrovascular accident ruled out. 3. No radiculopathy identified. 4. Recent pneumonia. 5. Hypotension. 6. Insomnia.  HISTORY OF PRESENT ILLNESS:  Ms. Shannon Barajas is a 60 year old white female who complains of general fatigue over the past month.  She was recently treated for pneumonia, and feels she has not quite gotten over that yet.  The patient complains of left leg weakness.  Apparently, the patient had an MRI of the spine that revealed a bulge at C4-5 on the right.  The patient was admitted for further evaluation of her left lower extremity weakness, noting that CT of the head was normal.  PAST MEDICAL HISTORY: 1. Recent left upper lobe pneumonia, treated with Ceftin by Dr. Jayme Barajas. 2. Mitral valve prolapse. 3. Irritable bowel syndrome. 4. History of an abscessed tooth. 5. Left breast fibrocystic disease by biopsy. 6. Cervical dysplasia. 7. Status post tonsillectomy.  HOSPITAL COURSE:  #1 - NEUROLOGIC:  The patient presented with left lower extremity weakness. We did have neurology see the patient.  They recommended ruling out cerebrovascular accident versus lumbar spine disease.  They did obtain a MRI of the LS spine which was okay without any evidence of herniated disk or radiculopathy.  MRI of the brain was also normal.  Per neurology, there was no indication for further evaluation, and nothing neurologically to explain her left-sided weakness.  #2 - PULMONARY:  As noted, the patient was recently treated for a left upper lobe pneumonia.   Followup chest x-ray shows resolution of her pneumonia.  The patient remained afebrile with a normal white count.  The patient has had a persistent cough, and was started on Tessalon pearls with some improvement.  #3 - FATIGUE:  Probably residual secondary to her recent pulmonary infection.  #4 - GASTROINTESTINAL:  There has been some history of a hepatic lesion.  The patient had an abdominal ultrasound this admission that showed no evidence of a hepatic lesion, and was a normal ultrasound.  Liver function tests were also normal.  #5 - INSOMNIA:  The patient was on Restoril prior to admission.  We did try some Desyrel that was not effective.  DISCHARGE LABORATORY DATA:  Coags were normal.  CMET was normal.  CBC with diff was normal.  MRI of the brain was negative.  Abdominal ultrasound showed no hepatic lesion or gallstones.  DISCHARGE MEDICATIONS: 1. Premarin 0.625 mg q.d. 2. Progesterone 100 mg q.d. 3. Clidinium 2 tabs q.d. 4. Aspirin 325 mg q.d. 5. Tessalon pearls 200 mg q.8h. p.r.n. 6. Restoril 30 mg p.r.n. sleep.  FOLLOWUP:  Dr. Lovell Barajas in 2 to 3 weeks. Dictated by:   Shannon Barajas, P.A. Attending Physician:  Shannon Barajas DD:  01/27/01 TD:  01/27/01 Job: [id number]

## 2010-06-06 NOTE — Discharge Summary (Signed)
Barajas, Shannon             ACCOUNT NO.:  [id number]   MEDICAL RECORD NO.:  [id number]          PATIENT TYPE:  OBV   LOCATION:  4737                         FACILITY:  [facility]   PHYSICIAN:  Valerie A. Felicity Coyer, MDDATE OF BIRTH:  07-11-1950   DATE OF ADMISSION:  03/10/2006  DATE OF DISCHARGE:  03/11/2006                               DISCHARGE SUMMARY   DISCHARGE DIAGNOSES:  1. Substernal chest pain likely secondary to pleurisy in the setting      of post viral syndrome.  2. Viral upper respiratory infection.  3. History of irritable bowel syndrome.   HISTORY OF PRESENT ILLNESS:  Shannon Barajas is a 60 year old white female  who has been out of work all week secondary to flu-like symptoms.  She  was seen at Urgent Care on March 06, 2006, and was found to be  negative for flu but she was prescribed Tamiflu.  On the morning of  March 10, 2006, while getting out of the shower the patient had onset  of severe substernal chest pain, especially with respirations.  This  persisted for greater than 20 minutes without change so she called EMS.  She was admitted for further evaluation and treatment.   PAST MEDICAL HISTORY:  1. History of mitral valve prolapse.  2. History of IBS.  3. History of cervical dysplasia.  4. History of esophageal stricture March, 2007.   COURSE OF HOSPITALIZATION:  1. Substernal chest pain, suspect pleurisy in setting of post viral      syndrome.  The patient was admitted.  She was noted to have an      elevated D-dimer.  However, a CT angio of the chest showed no      evidence of pulmonary embolus.  It did note some mild subcarinal      adenopathy which was thought to be nonspecific.  Some small      dependent inferior pericardial effusion was noted.  The patient      underwent serial cardiac enzymes which were negative.  She also was      placed on telemetry and maintained a normal sinus rhythm with      normal rate.  As her symptoms were thought to  be pleuritic in      nature she was given Toradol and Indocin with good relief.  Her      symptoms are currently much improved.  Also of note, the patient      was noted to have a mild thrombocytopenia.  This will need followup      at her followup appointment with Dr. Darryll Capers.   DISPOSITION:  The patient will be discharged to home.   MEDICATIONS:  At time of discharge:  1. Premarin 0.65 mg p.o. daily.  2. Aspirin 81 mg p.o. daily.  3. Multivitamins with minerals once daily.  4. Indomethacin 50 mg p.o. b.i.d. as needed.  5. Restoril 30 mg once daily in the evening.  6. Aspirin 81 mg p.o. daily.  7. Levbid as needed.  8. Amitiza 24 mcg twice daily.  9. Calcium, vitamin D, vitamin E, as prior to  admission.   PERTINENT LABORATORY DATA:  At discharge shows cardiac enzymes were  negative x6.  BUN 9, creatinine 0.7.  Hemoglobin 12.9, hematocrit 37.3,  platelets 125.   FOLLOWUP:  The patient is instructed to follow up with Dr. Darryll Capers  in 1-2 weeks and contact the office for an appointment.      Sandford Craze, NP      Raenette Rover. Felicity Coyer, MD  Electronically Signed    MO/MEDQ  D:  03/11/2006  T:  03/11/2006  Job:  [id number]   cc:   Stacie Glaze, MD

## 2010-06-09 ENCOUNTER — Ambulatory Visit: Payer: Self-pay | Admitting: Internal Medicine

## 2010-10-01 ENCOUNTER — Inpatient Hospital Stay (HOSPITAL_COMMUNITY)
Admission: EM | Admit: 2010-10-01 | Discharge: 2010-10-03 | DRG: 343 | Disposition: A | Discharge status: Home / Self Care | Payer: 59 | Attending: General Surgery | Admitting: General Surgery

## 2010-10-01 ENCOUNTER — Emergency Department (INDEPENDENT_AMBULATORY_CARE_PROVIDER_SITE_OTHER): Payer: 59

## 2010-10-01 ENCOUNTER — Emergency Department (HOSPITAL_BASED_OUTPATIENT_CLINIC_OR_DEPARTMENT_OTHER)
Admission: EM | Admit: 2010-10-01 | Discharge: 2010-10-01 | Disposition: A | Discharge status: Home / Self Care | Payer: 59 | Source: Home / Self Care | Attending: Emergency Medicine | Admitting: Emergency Medicine

## 2010-10-01 ENCOUNTER — Encounter (HOSPITAL_BASED_OUTPATIENT_CLINIC_OR_DEPARTMENT_OTHER): Payer: Self-pay | Admitting: Family Medicine

## 2010-10-01 ENCOUNTER — Other Ambulatory Visit (INDEPENDENT_AMBULATORY_CARE_PROVIDER_SITE_OTHER): Payer: Self-pay | Admitting: Surgery

## 2010-10-01 DIAGNOSIS — B952 Enterococcus as the cause of diseases classified elsewhere: Secondary | ICD-10-CM | POA: Diagnosis present

## 2010-10-01 DIAGNOSIS — K37 Unspecified appendicitis: Secondary | ICD-10-CM

## 2010-10-01 DIAGNOSIS — K5900 Constipation, unspecified: Secondary | ICD-10-CM | POA: Diagnosis present

## 2010-10-01 DIAGNOSIS — I319 Disease of pericardium, unspecified: Secondary | ICD-10-CM

## 2010-10-01 DIAGNOSIS — D696 Thrombocytopenia, unspecified: Secondary | ICD-10-CM | POA: Diagnosis present

## 2010-10-01 DIAGNOSIS — R161 Splenomegaly, not elsewhere classified: Secondary | ICD-10-CM

## 2010-10-01 DIAGNOSIS — R1031 Right lower quadrant pain: Secondary | ICD-10-CM

## 2010-10-01 DIAGNOSIS — R109 Unspecified abdominal pain: Secondary | ICD-10-CM

## 2010-10-01 DIAGNOSIS — D509 Iron deficiency anemia, unspecified: Secondary | ICD-10-CM | POA: Diagnosis present

## 2010-10-01 DIAGNOSIS — K222 Esophageal obstruction: Secondary | ICD-10-CM

## 2010-10-01 DIAGNOSIS — K3580 Unspecified acute appendicitis: Principal | ICD-10-CM | POA: Diagnosis present

## 2010-10-01 DIAGNOSIS — Z90710 Acquired absence of both cervix and uterus: Secondary | ICD-10-CM

## 2010-10-01 DIAGNOSIS — I059 Rheumatic mitral valve disease, unspecified: Secondary | ICD-10-CM | POA: Diagnosis present

## 2010-10-01 DIAGNOSIS — G8929 Other chronic pain: Secondary | ICD-10-CM | POA: Diagnosis present

## 2010-10-01 DIAGNOSIS — N3010 Interstitial cystitis (chronic) without hematuria: Secondary | ICD-10-CM | POA: Diagnosis present

## 2010-10-01 LAB — URINALYSIS, ROUTINE W REFLEX MICROSCOPIC
Glucose, UA: NEGATIVE mg/dL
Hgb urine dipstick: NEGATIVE
Protein, ur: NEGATIVE mg/dL
Specific Gravity, Urine: 1.019 (ref 1.005–1.030)
pH: 8 (ref 5.0–8.0)

## 2010-10-01 LAB — COMPREHENSIVE METABOLIC PANEL
ALT: 9 U/L (ref 0–35)
AST: 24 U/L (ref 0–37)
Albumin: 3.9 g/dL (ref 3.5–5.2)
CO2: 27 mEq/L (ref 19–32)
Calcium: 9.4 mg/dL (ref 8.4–10.5)
Creatinine, Ser: 0.9 mg/dL (ref 0.50–1.10)
Sodium: 140 mEq/L (ref 135–145)
Total Protein: 6.6 g/dL (ref 6.0–8.3)

## 2010-10-01 LAB — URINE MICROSCOPIC-ADD ON

## 2010-10-01 LAB — DIFFERENTIAL
Basophils Absolute: 0 10*3/uL (ref 0.0–0.1)
Eosinophils Absolute: 0 10*3/uL (ref 0.0–0.7)
Eosinophils Relative: 0 % (ref 0–5)
Lymphs Abs: 2.8 10*3/uL (ref 0.7–4.0)
Neutrophils Relative %: 59 % (ref 43–77)

## 2010-10-01 LAB — CBC
MCH: 29.5 pg (ref 26.0–34.0)
Platelets: ADEQUATE 10*3/uL (ref 150–400)
RBC: 4.4 MIL/uL (ref 3.87–5.11)
RDW: 13.6 % (ref 11.5–15.5)
WBC: 8.9 10*3/uL (ref 4.0–10.5)

## 2010-10-01 LAB — LACTIC ACID, PLASMA: Lactic Acid, Venous: 1.9 mmol/L (ref 0.5–2.2)

## 2010-10-01 MED ORDER — HYDROMORPHONE HCL 1 MG/ML IJ SOLN: 1.0000 mg | Freq: Once | INTRAMUSCULAR | Status: DC

## 2010-10-01 MED ORDER — HYDROMORPHONE HCL 1 MG/ML IJ SOLN
INTRAMUSCULAR | Status: AC
  Filled 2010-10-01: qty 1

## 2010-10-01 MED ORDER — HYDROMORPHONE HCL 1 MG/ML IJ SOLN
1.0000 mg | Freq: Once | INTRAMUSCULAR | Status: AC
  Administered 2010-10-01: 1 mg via INTRAVENOUS

## 2010-10-01 MED ORDER — IOHEXOL 300 MG/ML  SOLN
100.0000 mL | Freq: Once | INTRAMUSCULAR | Status: AC | PRN
  Administered 2010-10-01: 100 mL via INTRAVENOUS

## 2010-10-01 MED ORDER — PROMETHAZINE HCL 25 MG/ML IJ SOLN
25.0000 mg | Freq: Once | INTRAMUSCULAR | Status: AC
  Administered 2010-10-01: 25 mg via INTRAVENOUS
  Filled 2010-10-01: qty 1

## 2010-10-01 MED ORDER — SODIUM CHLORIDE 0.9 % IV BOLUS (SEPSIS)
1000.0000 mL | Freq: Once | INTRAVENOUS | Status: AC
  Administered 2010-10-01: 1000 mL via INTRAVENOUS

## 2010-10-01 MED ORDER — ONDANSETRON HCL 4 MG/2ML IJ SOLN: 4.0000 mg | Freq: Once | INTRAMUSCULAR | Status: DC

## 2010-10-01 MED ORDER — MORPHINE SULFATE 4 MG/ML IJ SOLN
4.0000 mg | Freq: Once | INTRAMUSCULAR | Status: AC
  Administered 2010-10-01: 4 mg via INTRAVENOUS
  Filled 2010-10-01: qty 1

## 2010-10-01 NOTE — ED Provider Notes (Signed)
History     CSN: [id number] Arrival date & time: 10/01/2010 12:24 PM  Chief Complaint  Patient presents with  . Abdominal Pain   HPI Comments: Patient has a history of IBS, esophageal stricture, chronic interstitial cystitis who presents today with a new abdominal pain. She awoke this morning with sharp crampy lower abdominal that is nothing like her previous.  There is no vomiting, nausea, diarrhea, fever.  She does have chronic pain with urination and takes pyridium.  She follows with GI and has had extensive evaluations of abdominal pain the past.  She denies any chest pain, shortness of breath, headache. She had normal bowel movement yesterday after taking her normal magnesium citrate dose that she takes 3 times a week. Denies any diarrhea, vaginal bleeding or discharge, back pain.  Denies any blood in the stool.  The history is provided by the patient.    Past Medical History  Diagnosis Date  . IBS (irritable bowel syndrome)   . Raynaud's syndrome   . Hemorrhoids   . Splenomegaly   . Hiatal hernia     small   . Stricture and stenosis of esophagus   . GERD (gastroesophageal reflux disease)   . H/O: iron deficiency anemia   . Thrombocytopenia, unspecified   . Unspecified constipation   . Insomnia, unspecified   . Other dysphagia   . Chronic interstitial cystitis   . Diverticulosis     mild     Past Surgical History  Procedure Date  . Breast lumpectomy   . Tubal ligation   . Esophageal dilation     Family History  Problem Relation Age of Onset  . Diabetes Maternal Grandmother   . Liver cancer      ?grandfather  . Colon cancer Maternal Grandfather     History  Substance Use Topics  . Smoking status: Never Smoker   . Smokeless tobacco: Never Used  . Alcohol Use: No    OB History    Grav Para Term Preterm Abortions TAB SAB Ect Mult Living                  Review of Systems  Constitutional: Positive for appetite change. Negative for fever, activity change  and fatigue.  HENT: Negative for congestion and rhinorrhea.   Eyes: Negative for visual disturbance.  Respiratory: Negative for cough, chest tightness and shortness of breath.   Cardiovascular: Negative for chest pain.  Gastrointestinal: Positive for abdominal pain. Negative for nausea, vomiting and diarrhea.  Genitourinary: Positive for dysuria. Negative for hematuria, vaginal bleeding and vaginal pain.  Musculoskeletal: Negative for back pain.  Skin: Negative for color change.  Neurological: Negative for headaches.  Hematological: Negative.   Psychiatric/Behavioral: Negative.     Physical Exam  BP 96/55  Pulse 78  Temp(Src) 98.5 F (36.9 C) (Oral)  Resp 18  Ht 5' 7.5" (1.715 m)  Wt 132 lb (59.875 kg)  BMI 20.37 kg/m2  SpO2 96%  Physical Exam  Constitutional: She is oriented to person, place, and time. She appears well-developed and well-nourished. No distress.  HENT:  Head: Normocephalic and atraumatic.  Mouth/Throat: Oropharynx is clear and moist. No oropharyngeal exudate.  Eyes: Conjunctivae are normal. Pupils are equal, round, and reactive to light.  Neck: Normal range of motion.  Cardiovascular: Normal rate, regular rhythm and normal heart sounds.   Pulmonary/Chest: Effort normal and breath sounds normal. No respiratory distress.  Abdominal: There is tenderness. There is no rebound and no guarding.  TTP RLQ, LLQ, suprapubic  Musculoskeletal: Normal range of motion. She exhibits no edema and no tenderness.  Neurological: She is alert and oriented to person, place, and time.  Skin: Skin is warm.    ED Course  Procedures  MDM Abdominal pain of different character than patient is used to, but no focalizing area on exam.  No peritoneal signs. Check labs including lactate and urinalysis. She patient's symptoms with IV hydration, pain medication and antiemetics.  Called by Dr. Karin Golden regarding CT findings. Patient has stable splenomegaly and pericardial effusion.   She has a borderline appendix that measures 10 mm with an appendicolith.  Patient states that she has been told she has splenomegaly in the past as well as, thrombocytopenia and was seen by Dr. Cyndie Chime and was told it was nothing to worry about.  She does not know about her pericardial effusion.  I evaluated this with bedside ultrasound and shows a small to moderate sized effusion with no evidence of tamponade physiology. Seems likely the patient may have an undiagnosed autoimmune or rheumatologic disorder. Nevertheless these findings are chronic since last year and not the cause of her pain today. I will discuss her appendix pathology with surgery.  CT findings discussed with Dr. Luisa Hart who would like to evaluate patient in [facility] ED.  Results for orders placed during the hospital encounter of 10/01/10  CBC      Component Value Range   WBC 8.9  4.0 - 10.5 (K/uL)   RBC 4.40  3.87 - 5.11 (MIL/uL)   Hemoglobin 13.0  12.0 - 15.0 (g/dL)   HCT 16.1  09.6 - 04.5 (%)   MCV 86.4  78.0 - 100.0 (fL)   MCH 29.5  26.0 - 34.0 (pg)   MCHC 34.2  30.0 - 36.0 (g/dL)   RDW 40.9  81.1 - 91.4 (%)   Platelets    150 - 400 (K/uL)   Value: PLATELET CLUMPS NOTED ON SMEAR, COUNT APPEARS ADEQUATE  DIFFERENTIAL      Component Value Range   Neutrophils Relative 59  43 - 77 (%)   Neutro Abs 5.2  1.7 - 7.7 (K/uL)   Lymphocytes Relative 32  12 - 46 (%)   Lymphs Abs 2.8  0.7 - 4.0 (K/uL)   Monocytes Relative 9  3 - 12 (%)   Monocytes Absolute 0.8  0.1 - 1.0 (K/uL)   Eosinophils Relative 0  0 - 5 (%)   Eosinophils Absolute 0.0  0.0 - 0.7 (K/uL)   Basophils Relative 0  0 - 1 (%)   Basophils Absolute 0.0  0.0 - 0.1 (K/uL)  LACTIC ACID, PLASMA      Component Value Range   Lactic Acid, Venous 1.9  0.5 - 2.2 (mmol/L)  COMPREHENSIVE METABOLIC PANEL      Component Value Range   Sodium 140  135 - 145 (mEq/L)   Potassium 4.5  3.5 - 5.1 (mEq/L)   Chloride 104  96 - 112 (mEq/L)   CO2 27  19 - 32 (mEq/L)   Glucose, Bld  96  70 - 99 (mg/dL)   BUN 11  6 - 23 (mg/dL)   Creatinine, Ser 7.82  0.50 - 1.10 (mg/dL)   Calcium 9.4  8.4 - 95.6 (mg/dL)   Total Protein 6.6  6.0 - 8.3 (g/dL)   Albumin 3.9  3.5 - 5.2 (g/dL)   AST 24  0 - 37 (U/L)   ALT 9  0 - 35 (U/L)   Alkaline Phosphatase 90  39 - 117 (U/L)   Total Bilirubin 0.6  0.3 - 1.2 (mg/dL)   GFR calc non Af Amer >60  >60 (mL/min)   GFR calc Af Amer >60  >60 (mL/min)  LIPASE, BLOOD      Component Value Range   Lipase 35  11 - 59 (U/L)  URINALYSIS, ROUTINE W REFLEX MICROSCOPIC      Component Value Range   Color, Urine YELLOW  YELLOW    Appearance CLOUDY (*) CLEAR    Specific Gravity, Urine 1.019  1.005 - 1.030    pH 8.0  5.0 - 8.0    Glucose, UA NEGATIVE  NEGATIVE (mg/dL)   Hgb urine dipstick NEGATIVE  NEGATIVE    Bilirubin Urine NEGATIVE  NEGATIVE    Ketones, ur NEGATIVE  NEGATIVE (mg/dL)   Protein, ur NEGATIVE  NEGATIVE (mg/dL)   Urobilinogen, UA 0.2  0.0 - 1.0 (mg/dL)   Nitrite NEGATIVE  NEGATIVE    Leukocytes, UA SMALL (*) NEGATIVE   URINE MICROSCOPIC-ADD ON      Component Value Range   Squamous Epithelial / LPF MANY (*) RARE    WBC, UA 11-20  <3 (WBC/hpf)   RBC / HPF 0-2  <3 (RBC/hpf)   Bacteria, UA MANY (*) RARE    Urine-Other MUCOUS PRESENT     Ct Abdomen Pelvis W Contrast  10/01/2010  *RADIOLOGY REPORT*  Clinical Data: Lower abdominal pain.  History of I B S.  Esophageal stricture.  CT ABDOMEN AND PELVIS WITH CONTRAST  Technique:  Multidetector CT imaging of the abdomen and pelvis was performed following the standard protocol during bolus administration of intravenous contrast.  Contrast: OMNIPAQUE IOHEXOL 300 MG/ML IV SOLN  Comparison: 09/13/2009 [facility]  Findings: There is a pericardial effusion.  I think this was present on the previous studies.  Lung bases are clear.  Multiple benign appearing hepatic cysts appear the same in size and number. No new or significant hepatic lesion. Right lobe extends fairly far caudally  but the overall volume of hepatic tissue appears within normal limits, though at the upper range there of.  No calcified gallstones.  Chronic splenomegaly remains evident with measurement at the hilum of 13 x 6.6 cm and cephalocaudal length of 14.9 cm. The pancreas is normal.  The adrenal glands are normal.  The kidneys are normal except for a tiny cyst along the lateral margin of the left kidney.  The aorta and IVC are normal.  No retroperitoneal mass or adenopathy.  No free intraperitoneal fluid or air.  No acute bowel pathology is seen.  In the pelvis, the uterus and adnexal regions appear normal.  No abnormality of the bladder is seen.  There is a moderate amount of fecal matter in the cecum which is tacked down into the posterior pelvis.  The appendix is visible as a fluid-filled structure maximal diameter is 9-10 mm. The wall does not appear thickened and there is no surrounding inflammatory change.  There is an appendicolith at the tip of the appendix.  IMPRESSION: Pericardial effusion.  Chronic splenomegaly.  No definite acute appendicitis.  However, there are several borderline findings.  The appendix measures 9-10 mm in diameter and is fluid-filled and contains an appendicolith.  The wall is not thickened and there is no periappendiceal inflammatory change. Depending on the clinical presentation, this might bear further follow-up.  Original Report Authenticated By: Thomasenia Sales, M.D.       Glynn Octave, MD 10/01/10 1520

## 2010-10-01 NOTE — ED Notes (Signed)
Pt c/o lower abdomen pain since today with urinary frequency.

## 2010-10-02 LAB — CBC
HCT: 31 % — ABNORMAL LOW (ref 36.0–46.0)
Hemoglobin: 10.8 g/dL — ABNORMAL LOW (ref 12.0–15.0)
MCH: 29.9 pg (ref 26.0–34.0)
MCHC: 34.8 g/dL (ref 30.0–36.0)
MCV: 85.9 fL (ref 78.0–100.0)
Platelets: 106 10*3/uL — ABNORMAL LOW (ref 150–400)
RBC: 3.61 MIL/uL — ABNORMAL LOW (ref 3.87–5.11)
RDW: 13.6 % (ref 11.5–15.5)
WBC: 8.5 10*3/uL (ref 4.0–10.5)

## 2010-10-02 LAB — DIFFERENTIAL
Basophils Absolute: 0 10*3/uL (ref 0.0–0.1)
Lymphs Abs: 1.4 10*3/uL (ref 0.7–4.0)
Monocytes Absolute: 0.3 10*3/uL (ref 0.1–1.0)
Monocytes Relative: 3 % (ref 3–12)
Neutro Abs: 6.8 10*3/uL (ref 1.7–7.7)

## 2010-10-03 DIAGNOSIS — N390 Urinary tract infection, site not specified: Secondary | ICD-10-CM

## 2010-10-03 LAB — URINE CULTURE

## 2010-10-08 LAB — POCT URINALYSIS DIP (DEVICE)
Bilirubin Urine: NEGATIVE
Glucose, UA: NEGATIVE
Ketones, ur: NEGATIVE
Nitrite: NEGATIVE
Operator id: 126491
Protein, ur: NEGATIVE
Specific Gravity, Urine: 1.015
Urobilinogen, UA: 0.2
pH: 5.5

## 2010-10-08 LAB — URINE CULTURE

## 2010-10-09 NOTE — H&P (Signed)
NAMEKEYLEN, UZELAC NO.:  [id number]  MEDICAL RECORD NO.:  [id number]  LOCATION:  WLED                         FACILITY:  [facility]  PHYSICIAN:  Maisie Fus A. Lilja Soland, M.D.DATE OF BIRTH:  1950-02-11  DATE OF ADMISSION:  10/01/2010 DATE OF DISCHARGE:                             HISTORY & PHYSICAL   REFERRING PHYSICIAN:  Dr. Devoria Albe, ER at [facility].  PRIMARY CARE DOCTOR:  Dr. Darryll Capers.  GASTROENTEROLOGY:  Dr. Marina Goodell.  GYN:  Dr. Sherron Monday.  REASON FOR CONSULTATION:  She woke up with new abdominal pain.  BRIEF HISTORY:  The patient is a 60 year old white female who has chronic abdominal pain, which is caused by irritable bowel syndrome and most recently interstitial cystitis.  Today, she woke up around 5 a.m. with abdominal pain, felt bad and decided to take a sleeping pill and go back to sleep.  She did this.  She woke up again around 9:30 a.m.  The pain persisted and she called her husband who ultimately took her to the emergency room at [facility]  There she underwent evaluation by Dr. Lynelle Doctor.  CT scan was obtained, initially that showed pericardial effusion that was probably chronic based on her other studies.  She has splenomegaly, which is stable.  There were no gallstones.  The appendix is visible.  It is fluid-filled.  It is about 9 to 10 mm in diameter has seen.  The wall does not appear thickened and there is no surrounding inflammatory changes.  There is an appendicolith at the tip of the appendix, which was not present on her prior CT, September 13, 2009.  It was their opinion that she has new findings of her appendix, but it was not definitely appendicitis.  ADDITIONAL STUDIES:  CBC, which shows a white count of 8900 and hemoglobin is 13.  Platelet count could not be obtained.  They were clumped.  Lactic acid was 1.9.  LFTs were normal with total bilirubin of 0.6, alkaline phosphatase 24, SGOT of 24, and SGPT of 9.  Glucose 96, sodium 140,  potassium 4.5, chloride 104, CO2 is 27, BUN is 11, and creatinine is 0.9.  UA showed 11 to 20 white blood cells and bacteria. Lipase was 35.  At the completion of the study, Dr. Luisa Hart was contacted and the patient was transferred to [facility] for treatment for possible appendicitis.  The patient describes her pain as ongoing.  It is over her whole lower abdomen, sometimes goes up the left side.  She has had pain medications and that has been her only relief.  She has chronic pain, but this is quite different than her usual problems.  She has chronic constipation, takes a bottle of magnesium citrate 3 times a week.  She had a bottle yesterday evening and felt that she was at her baseline, Tuesday before she went to bed.  She has also just completed 3 weeks of treatment for interstitial cystitis.  The last treatment was last Friday and she said it did not give her normal relief.  She is still taking some Pyridium. Her last UA was clear.  PAST MEDICAL HISTORY: 1. Interstitial cystitis. 2. Irritable bowel syndrome with constipation.  3. She has chronic abdominal pain. 4. History of esophageal strictures and hiatal hernia.  She has also     undergone esophageal dilatations. 5. She has been treated for endometrial polyps. 6. She has problems with back pain intermittently. 7. Thrombocytopenia. 8. Chronic splenomegaly. 9. GERD. 10.Iron deficiency anemia. 11.History of mitral valve prolapse.  PAST SURGICAL HISTORY: 1. She had breast biopsies in 1996 and 1997, which were negative.     History of D and C. 2. History of EGD and dilatations. 3. Hysteroscopy, D and C, January 2010. 4. Cystoscopy, hydrodistention of bladder installation, October 2011.  FAMILY HISTORY:  Mother is living in good health.  Father died of cirrhosis and lymphoma.  No brothers or sisters.  SOCIAL HISTORY:  Tobacco none.  Alcohol none.  Drugs none.  She is married.  She is a Animal nutritionist at [employer].  REVIEW OF SYSTEMS:  FEVER:  None.  Weight is stable.  Her p.o. intake is normal.  SKIN:  No changes.  CV:  Positive for headache.  She has had some dizziness recently.  No history of stroke or seizure.  PULMONARY: No orthopnea, no PND, no dyspnea on exertion.  No coughing or wheezing. No recent URIs.  CARDIAC:  She denies any chest pain.  She had some discomfort going up into her left side with this current pain.  She knows she has pericardial effusion.  She has no prior history of this and no chest pain.  GI:  No nausea, vomiting, or diarrhea.  She has chronic constipation.  No recent blood in her stool.  GU:  Positive for frequency with her cystitis and also pain on and off.  EXTREMITIES: Lower extremities, she feels like she has had weakness in her left lower extremity dating back 10 years for possible undiagnosed TIA. MUSCULOSKELETAL:  Positive for decreased range of motion of her left arm.  She says it hurts when she does this.  PSYCHIATRIC:  No new changes.  CURRENT MEDICATIONS:  She is on, 1. B complex 1 daily. 2. Vitamin D3, 1000 units 2 tablets daily. 3. Restoril 30 mg 2 tablets h.s. 4. Nexium 40 mg h.s. 5. Multivitamins 1 daily. 6. Magnesium citrate 300 mg 3 times a week, 10 ounce bottle. 7. Ferrous sulfate 325 mg daily. 8. Estradiol 2 mg p.o. h.s. 9. Dicyclomine 10 mg p.o. b.i.d.  ALLERGIES: 1. ERYTHROMYCIN. 2. CLARITHROMYCIN (BIAXIN).  PHYSICAL EXAMINATION:  GENERAL:  This is a thin, well-nourished white female, in no acute distress, but quite uncomfortable. VITAL SIGNS:  Temperature is 98.7, heart rate is 80, blood pressure is 107/56, respiratory rate is 18, and saturation 98% on room air. HEAD:  Normocephalic. EYES, EARS, NOSE, AND THROAT:  All grossly within normal limits. NECK:  Trachea is in the midline.  Thyroid is nonpalpable.  There is no thyromegaly.  No JVD.  No bruits. CHEST:  Clear to auscultation and percussion. CARDIAC:  Normal S1 and S2.   No murmur or rub was heard.  Pulses are +2 and equal in both the upper and lower extremities.  Chest wall is nontender. ABDOMEN:  Bowel sounds are present.  She is not distended.  She complains of pain in the mid-abdominal area, but she is not tender to palpation.  There are no hernia, masses, or abscesses. GU/RECTAL:  Deferred. MUSCULOSKELETAL:  No changes. LYMPHADENOPATHY:  None palpated, cervical, axillary, or femoral. NEUROLOGIC:  No focal deficits.  Cranial nerves II through XII are grossly intact. PSYCHIATRIC:  Normal affect.  LABORATORY DATA:  UA shows 11 to 20 wbc's per high-power field along with bacteria.  Lipase is 35.  Labs as above.  DIAGNOSTICS:  As above.  IMPRESSION: 1. Abdominal pain with new abnormal findings of fluid and     appendicolith, not present, August 2011. 2. Pericardial effusion, questionably not new and without symptoms. 3. Chronic abdominal pain with history of irritable bowel syndrome,     interstitial cystitis.  Her last treatment for interstitial     cystitis was Friday, last week. 4. Chronic constipation. 5. History of hiatal hernia/gastroesophageal reflux disease/esophageal     strictures and dilatation. 6. Ongoing splenomegaly. 7. Thrombocytopenia. 8. History of mitral valve prolapse. 9. Iron deficiency anemia.  PLAN:  She had been seen and evaluated by Dr. Luisa Hart and Dr. [name]. We will offer her laparotomy and probable appendectomy with further treatment as indicated.     Eber Hong, P.A.   ______________________________ Clovis Pu Deforrest Bogle, M.D.    WDJ/MEDQ  D:  10/01/2010  T:  10/01/2010  Job:  [id number]  cc:   Stacie Glaze, MD [address] [place] [address]  Martina Sinner, MD Fax: [phone]  Wilhemina Bonito. Marina Goodell, MD [address] [place] [address]  Electronically Signed by Harriette Bouillon M.D. on 10/09/2010 62:13:08 AM

## 2010-10-09 NOTE — Discharge Summary (Signed)
  NAMEEASTER, KENNEBREW NO.:  [id number]  MEDICAL RECORD NO.:  [id number]  LOCATION:  1536                         FACILITY:  [facility]  PHYSICIAN:  Maisie Fus A. Masiyah Engen, M.D.DATE OF BIRTH:  [birth year]  DATE OF ADMISSION:  10/01/2010 DATE OF DISCHARGE:  10/03/2010                              DISCHARGE SUMMARY   ADDENDUM:  BRIEF HISTORY:  The patient is a 60 year old, who presented with acute appendicitis, underwent appendectomy.  She has a history of interstitial cystitis and her urine cultures growing out greater than 100,000 enterococcus.  She was placed empirically on Cipro since studies not available.  At discharge, the patient informed the nurse that Cipro did not work for her.  We discussed the options suitable to treat enterococcus empirically and she choses amoxicillin 500 mg p.o. q.8 h. for 7 days.  We will subsequently discontinue her Cipro and change her to amoxicillin.     Eber Hong, P.A.   ______________________________ Clovis Pu Susumu Hackler, M.D.    WDJ/MEDQ  D:  10/03/2010  T:  10/04/2010  Job:  [id number]  cc:   Dr. Konrad Saha, MD Fax: [phone]  Electronically Signed by Harriette Bouillon M.D. on 10/09/2010 02:28:56 AM

## 2010-10-09 NOTE — Discharge Summary (Signed)
Shannon Barajas, AYAD NO.:  [id number]  MEDICAL RECORD NO.:  [id number]  LOCATION:  1536                         FACILITY:  [facility]  PHYSICIAN:  Maisie Fus A. Jamae Tison, M.D.DATE OF BIRTH:  04-21-1950  DATE OF ADMISSION:  10/01/2010 DATE OF DISCHARGE:  10/03/2010                              DISCHARGE SUMMARY   ADMISSION DIAGNOSIS:  Possible appendicitis with abdominal pain.  DISCHARGE DIAGNOSES: 1. Acute appendicitis. 2. Urinary tract infection.  ADDITIONAL DIAGNOSES: 1. Interstitial cystitis. 2. Irritable bowel syndrome with constipation. 3. Chronic abdominal pain. 4. History of esophageal strictures, hiatal hernia with esophageal     dilatation. 5. History of treatment for endometrial polyps. 6. History of problems with intermittent back pain. 7. Thrombocytopenia. 8. Gastroesophageal reflux disease. 9. Iron deficiency anemia. 10.History of mitral valve prolapse.  BRIEF HISTORY:  The patient is a 60 year old female, who developed abdominal pain, early a.m., felt bad, not like her usual abdominal pains, GERD, or cystitis.  She went back to sleep and woke up again at 09:30 with pain that persisted.  She has ultimately presented to the [place] ER where a CT showed a small pericardial effusion, which may not be new.  Splenomegaly, no gallstones, the appendix was visible, and was about 9 to 10 mm in diameter with fluid visible in the area with a small appendicolith, which was not in previous studies.  At this point, she was transferred to [facility]  She was seen by Dr. Luisa Barajas and Dr. Daphine Barajas.  She was admitted for appendectomy.  PAST MEDICAL HISTORY:  As above.  PAST SURGICAL HISTORY: 1. History of breast biopsies in 1996 and 1997, both were negative.     History of EGD and dilatation. 2. History of hysteroscopy with D and C, January 2010. 3. Cystoscopy, hydrodistillation, bladder instillation.  HOSPITAL COURSE:  The patient was seen in the  ER and it was Dr. Luisa Barajas and Dr. Ermalene Searing opinion, should that best served with an appendectomy. She is taken to the OR and it was done at night.  Her UA on admission showed white cells and bacteria.  Urine culture was obtained.  On the first postoperative morning, she was having quite a bit of discomfort and was not able to ambulate very well.  We mobilized her throughout the day, advanced her diet, and although she continued to have some discomfort through the evening.  This morning, she is eating well.  She is walking without difficulty.  She is able to void.  Her urine culture has grown out enterococcus.  I do not have a sensitivity.  Overall, she is doing quite well.  We will plan to discharge her home today.  She will go home on her preadmission medicines, which includes: 1. Dicyclomine 10 mg 1 capsule b.i.d. 2. Estradiol vaginal 2 mg daily at beditme. 3. Ferrous sulfate 325 mg daily. 4. Magnesium citrate one bottle 3 times a week. 5. Multivitamin one daily,. 6. Nexium 40 mg daily. 7. Restoril 30 mg 2 tablets at bedtime. 8. Vitamin D and D3 as before.  NEW MEDICINES: 1. Cipro 500 mg one p.o. b.i.d. for 7 days. 2. Hydrocodone/APAP 5/325 one to two p.o. q.4 h. p.r.n.  She  will contact our office and set herself up for a followup appointment with Dr. Daphine Barajas in 2 to 3 weeks.  She is instructed to contact Dr. Lovell Sheehan about her pericardial effusion and urinary tract infection, and she plans to contact Dr. Sherron Monday about urinary tract infection also.  She is instructed to call if she has fever over 101, trouble voiding, problems with her incision including redness, drainage or increased abdominal discomfort.  CONDITION ON DISCHARGE:  Improving.     Shannon Barajas, P.A.   ______________________________ Shannon Barajas Shannon Barajas, M.D.    WDJ/MEDQ  D:  10/03/2010  T:  10/03/2010  Job:  [id number]  cc:   Dr. Garth Bigness. Marina Goodell, MD [address] [place]  [id number]  Martina Sinner, MD Fax: [phone]  Electronically Signed by Shannon Barajas M.D. on 10/09/2010 02:28:53 AM

## 2010-10-14 LAB — POCT URINALYSIS DIP (DEVICE)
Hgb urine dipstick: NEGATIVE
Ketones, ur: NEGATIVE
Protein, ur: NEGATIVE
Specific Gravity, Urine: <=1.005
pH: 5.5

## 2010-10-14 LAB — URINE CULTURE: Colony Count: 30000

## 2010-10-15 NOTE — Op Note (Signed)
  NAMEELLOUISE, MCWHIRTER NO.:  [id number]  MEDICAL RECORD NO.:  [id number]  LOCATION:  1536                         FACILITY:  [facility]  PHYSICIAN:  Thornton Park. [name], MD  DATE OF BIRTH:  August 22, 1950  DATE OF PROCEDURE:  10/01/2010 DATE OF DISCHARGE:                              OPERATIVE REPORT   PREOPERATIVE DIAGNOSIS:  A 60 year old white female with acute appendicitis by CT scan at [facility].  PROCEDURES:  Laparoscopic appendectomy.  FINDINGS:  Acute appendicitis.  DESCRIPTION OF PROCEDURE:  This lady, 60 year old white female, was taken to room one on Wednesday, October 01, 2010, and given general anesthesia.  Preoperatively, cath urine was obtained for urinalysis and culture, and the abdomen was prepped with PCMX and draped sterilely. She received 1 g of Invanz preoperatively.  The abdomen was entered through longitudinal incision down into her umbilicus where a small umbilical hernia was found and opened up to give [place] access to the abdomen.  The Hassan cannula was placed and the abdomen was insufflated. A 5 mm trocar was placed in the right upper quadrant and an oblique 11 was placed in the left lower quadrant.  Survey of the abdomen with only a bit of adhesions connected to the cecum over the sidewall which I took down sharply.  We looked into pelvis, sucked out some kind of cloudy fluid, and pulled up acute appendicitis with an exudate on it.  I went through its mesentery with Harmonic scalpel and then isolated the base, stapled across it with an Endo-GIA white load vascular cartridge.  No bleeding was seen and the appendix was placed in a bag and brought out through umbilicus.  I went back in with scope, again looked down into pelvis, and then I looked up her spleen which has been noted to be slightly enlarged.  I took pictures which I will give to her.  The colon was noted to be dilated and somewhat tortuous protecting the left  upper quadrant.  No other abnormalities were noted.  I then repaired the umbilical defect with a figure-of-eight suture 0 Vicryl under laparoscopic vision.  The wounds were all injected with some Marcaine, closed with 4-0 Vicryl, Benzoin, and Dermabond.  Patient was taken to recovery room in satisfactory condition.     Thornton Park [name], MD     MBM/MEDQ  D:  10/01/2010  T:  10/02/2010  Job:  [id number]  Electronically Signed by Luretha Murphy MD on 10/15/2010 07:29:48 AM

## 2010-10-20 LAB — POCT URINALYSIS DIP (DEVICE)
Bilirubin Urine: NEGATIVE
Glucose, UA: NEGATIVE
Nitrite: NEGATIVE
Operator id: 239701
pH: 5

## 2010-10-31 ENCOUNTER — Encounter (INDEPENDENT_AMBULATORY_CARE_PROVIDER_SITE_OTHER): Payer: Self-pay | Admitting: Surgery

## 2010-10-31 ENCOUNTER — Ambulatory Visit (INDEPENDENT_AMBULATORY_CARE_PROVIDER_SITE_OTHER): Payer: Commercial Managed Care - PPO | Admitting: Surgery

## 2010-10-31 VITALS — BP 120/80 | HR 62 | Temp 97.6°F | Resp 13 | Ht 67.5 in | Wt 130.4 lb

## 2010-10-31 DIAGNOSIS — K37 Unspecified appendicitis: Secondary | ICD-10-CM

## 2010-10-31 NOTE — Progress Notes (Signed)
Mr. [name] came in for postop visit after her laparoscopic appendectomy at [facility] long approximately one month ago. Her path report showed appendicitis and periappendicitis with luminal obliteration. Her husband indicated that she's been having some lower abdominal pain all summer. Hopefully her appendix has been causing pain. Time will tell.  Her incisions have healed well. She did have some sharp pain a couple days ago and I gave her another prescription for Vicodin to take if needed. I will see her back on an as needed basis. Condition improved

## 2010-11-18 ENCOUNTER — Other Ambulatory Visit: Payer: Self-pay | Admitting: Internal Medicine

## 2011-02-27 ENCOUNTER — Telehealth: Payer: Self-pay | Admitting: Internal Medicine

## 2011-02-27 MED ORDER — LINACLOTIDE 290 MCG PO CAPS: 290.0000 ug | ORAL_CAPSULE | Freq: Every day | ORAL | Status: DC

## 2011-02-27 NOTE — Telephone Encounter (Signed)
Pt states that she has IBS and that she has the "constipation side of it." Pt read in a magazine about Linzess and is interested in trying it. Pt wants to know if Dr. Marina Goodell would prescribe or give her some samples of this. Please advise.

## 2011-02-27 NOTE — Telephone Encounter (Signed)
Pt aware and samples left up front for pickup. 

## 2011-02-27 NOTE — Telephone Encounter (Signed)
She can try Linzess 290 mcg capsule once daily. Should be taken on an empty stomach at least 30 minutes before the first meal of the day. Ingest whole.Marland KitchenLet her know that the most common side effect is diarrhea. Give her several weeks of samples to try. If it works, I would be happy to prescribe it for her use.

## 2011-04-08 ENCOUNTER — Other Ambulatory Visit: Payer: Self-pay

## 2011-04-08 ENCOUNTER — Telehealth: Payer: Self-pay | Admitting: Internal Medicine

## 2011-04-08 MED ORDER — LINACLOTIDE 290 MCG PO CAPS: 290.0000 ug | ORAL_CAPSULE | Freq: Every day | ORAL | Status: DC

## 2011-04-08 MED ORDER — LINACLOTIDE 290 MCG PO CAPS: 1.0000 | ORAL_CAPSULE | Freq: Every day | ORAL | Status: DC

## 2011-04-20 NOTE — Telephone Encounter (Signed)
COMPLETED

## 2011-05-08 ENCOUNTER — Other Ambulatory Visit: Payer: Self-pay | Admitting: Internal Medicine

## 2011-05-08 ENCOUNTER — Telehealth: Payer: Self-pay | Admitting: Internal Medicine

## 2011-05-08 NOTE — Telephone Encounter (Signed)
Pt has enough temazepam to last until 05-17-2011. Pt has appt on 05-22-2011. Pt pharm [place] out pt phar [phone]

## 2011-05-11 ENCOUNTER — Other Ambulatory Visit: Payer: Self-pay | Admitting: *Deleted

## 2011-05-11 MED ORDER — TEMAZEPAM 30 MG PO CAPS: ORAL_CAPSULE | ORAL | Status: DC

## 2011-05-11 NOTE — Telephone Encounter (Signed)
Done

## 2011-05-22 ENCOUNTER — Ambulatory Visit (INDEPENDENT_AMBULATORY_CARE_PROVIDER_SITE_OTHER): Payer: Commercial Managed Care - PPO | Admitting: Internal Medicine

## 2011-05-22 ENCOUNTER — Ambulatory Visit: Payer: Commercial Managed Care - PPO | Admitting: Internal Medicine

## 2011-05-22 VITALS — BP 130/80 | HR 72 | Temp 98.3°F | Resp 16 | Ht 67.5 in | Wt 132.0 lb

## 2011-05-22 DIAGNOSIS — K589 Irritable bowel syndrome without diarrhea: Secondary | ICD-10-CM

## 2011-05-22 DIAGNOSIS — Z0000 Encounter for general adult medical examination without abnormal findings: Secondary | ICD-10-CM

## 2011-08-11 ENCOUNTER — Other Ambulatory Visit: Payer: Self-pay | Admitting: Internal Medicine

## 2011-09-28 ENCOUNTER — Other Ambulatory Visit (HOSPITAL_COMMUNITY): Payer: Self-pay | Admitting: Obstetrics & Gynecology

## 2011-09-28 DIAGNOSIS — Z1231 Encounter for screening mammogram for malignant neoplasm of breast: Secondary | ICD-10-CM

## 2011-10-14 ENCOUNTER — Encounter: Payer: Self-pay | Admitting: Internal Medicine

## 2011-10-14 NOTE — Progress Notes (Signed)
  Subjective:    Patient ID: Shannon Barajas, female    DOB: 1950-12-11, 61 y.o.   MRN: [id number]  HPI Patient presents to talk about irritable bowel syndrome and participation in study she'll need an EKG participating in the study.  She has moderate to severe irritable bowel syndrome. She is constipation dominant and has resultant gastroesophageal reflux.  She has a history of Raynaud's disease and history of thrombocytopenia with iron deficiency anemia which has resolved with iron replacement.     Review of Systems  Constitutional: Positive for fatigue. Negative for activity change and appetite change.  HENT: Negative for ear pain, congestion, neck pain, postnasal drip and sinus pressure.   Eyes: Negative for redness and visual disturbance.  Respiratory: Negative for cough, shortness of breath and wheezing.   Gastrointestinal: Positive for abdominal pain and constipation. Negative for abdominal distention.  Genitourinary: Negative for dysuria, frequency and menstrual problem.  Musculoskeletal: Negative for myalgias, joint swelling and arthralgias.  Skin: Negative for rash and wound.  Neurological: Negative for dizziness, weakness and headaches.  Hematological: Negative for adenopathy. Does not bruise/bleed easily.  Psychiatric/Behavioral: Negative for disturbed wake/sleep cycle and decreased concentration.       Objective:   Physical Exam  Constitutional: She is oriented to person, place, and time. She appears well-developed and well-nourished. No distress.  HENT:  Head: Normocephalic and atraumatic.  Right Ear: External ear normal.  Left Ear: External ear normal.  Nose: Nose normal.  Mouth/Throat: Oropharynx is clear and moist.  Eyes: Conjunctivae normal and EOM are normal. Pupils are equal, round, and reactive to light.  Neck: Normal range of motion. Neck supple. No JVD present. No tracheal deviation present. No thyromegaly present.  Cardiovascular: Normal rate, regular  rhythm, normal heart sounds and intact distal pulses.   No murmur heard. Pulmonary/Chest: Effort normal and breath sounds normal. She has no wheezes. She exhibits no tenderness.  Abdominal: Soft. Bowel sounds are normal. She exhibits no distension. There is tenderness.  Musculoskeletal: Normal range of motion. She exhibits no edema and no tenderness.  Lymphadenopathy:    She has no cervical adenopathy.  Neurological: She is alert and oriented to person, place, and time. She has normal reflexes. No cranial nerve deficit.  Skin: Skin is warm and dry. She is not diaphoretic.  Psychiatric: She has a normal mood and affect. Her behavior is normal.          Assessment & Plan:  EKG obtained to screen patient for to presentation and study drugs for IBS.  Clearance given to participate in study

## 2011-10-14 NOTE — Patient Instructions (Signed)
There are no focal EKG abnormalities to prevent you from participating in the IBS study

## 2011-10-27 ENCOUNTER — Ambulatory Visit (HOSPITAL_COMMUNITY)
Admission: RE | Admit: 2011-10-27 | Discharge: 2011-10-27 | Disposition: A | Discharge status: Home / Self Care | Payer: 59 | Source: Ambulatory Visit | Attending: Obstetrics & Gynecology | Admitting: Obstetrics & Gynecology

## 2011-10-27 DIAGNOSIS — Z1231 Encounter for screening mammogram for malignant neoplasm of breast: Secondary | ICD-10-CM | POA: Insufficient documentation

## 2011-11-03 ENCOUNTER — Other Ambulatory Visit: Payer: Self-pay | Admitting: Internal Medicine

## 2012-02-12 ENCOUNTER — Other Ambulatory Visit: Payer: Commercial Managed Care - PPO

## 2012-02-19 ENCOUNTER — Encounter: Payer: Commercial Managed Care - PPO | Admitting: Internal Medicine

## 2012-04-27 ENCOUNTER — Other Ambulatory Visit (INDEPENDENT_AMBULATORY_CARE_PROVIDER_SITE_OTHER): Payer: 59

## 2012-04-27 ENCOUNTER — Encounter: Payer: Self-pay | Admitting: Internal Medicine

## 2012-04-27 ENCOUNTER — Ambulatory Visit (INDEPENDENT_AMBULATORY_CARE_PROVIDER_SITE_OTHER): Payer: 59 | Admitting: Internal Medicine

## 2012-04-27 VITALS — BP 84/50 | HR 88 | Ht 67.0 in | Wt 133.1 lb

## 2012-04-27 DIAGNOSIS — R1084 Generalized abdominal pain: Secondary | ICD-10-CM

## 2012-04-27 DIAGNOSIS — K625 Hemorrhage of anus and rectum: Secondary | ICD-10-CM

## 2012-04-27 DIAGNOSIS — K589 Irritable bowel syndrome without diarrhea: Secondary | ICD-10-CM

## 2012-04-27 DIAGNOSIS — K222 Esophageal obstruction: Secondary | ICD-10-CM

## 2012-04-27 DIAGNOSIS — K219 Gastro-esophageal reflux disease without esophagitis: Secondary | ICD-10-CM

## 2012-04-27 DIAGNOSIS — D509 Iron deficiency anemia, unspecified: Secondary | ICD-10-CM

## 2012-04-27 LAB — CBC WITH DIFFERENTIAL/PLATELET
Basophils Relative: 0.5 % (ref 0.0–3.0)
Eosinophils Relative: 0.2 % (ref 0.0–5.0)
Hemoglobin: 11 g/dL — ABNORMAL LOW (ref 12.0–15.0)
Lymphocytes Relative: 40.6 % (ref 12.0–46.0)
MCV: 84.4 fl (ref 78.0–100.0)
Monocytes Absolute: 0.4 10*3/uL (ref 0.1–1.0)
Neutrophils Relative %: 52.6 % (ref 43.0–77.0)
RBC: 3.83 Mil/uL — ABNORMAL LOW (ref 3.87–5.11)
WBC: 5.9 10*3/uL (ref 4.5–10.5)

## 2012-04-27 LAB — BASIC METABOLIC PANEL
Chloride: 103 mEq/L (ref 96–112)
Creatinine, Ser: 1.3 mg/dL — ABNORMAL HIGH (ref 0.4–1.2)
Potassium: 4.2 mEq/L (ref 3.5–5.1)
Sodium: 139 mEq/L (ref 135–145)

## 2012-04-27 LAB — HEPATIC FUNCTION PANEL
Albumin: 3.7 g/dL (ref 3.5–5.2)
Alkaline Phosphatase: 79 U/L (ref 39–117)
Total Protein: 6.2 g/dL (ref 6.0–8.3)

## 2012-04-27 MED ORDER — HYDROCORTISONE ACETATE 25 MG RE SUPP: 25.0000 mg | Freq: Every evening | RECTAL | Status: DC | PRN

## 2012-04-27 NOTE — Patient Instructions (Addendum)
Your physician has requested that you go to the basement for lab work before leaving today  You have been scheduled for an abdominal ultrasound at [facility] (1st floor of hospital) on 04-28-12 at 8:00am. Please arrive 15 minutes prior to your appointment for registration. Make certain not to have anything to eat or drink after midnight prior to your appointment. Should you need to reschedule your appointment, please contact radiology at [phone]. This test typically takes about 30 minutes to perform.  We have sent the following medications to your pharmacy for you to pick up at your convenience:  Anusol suppositories

## 2012-04-27 NOTE — Progress Notes (Signed)
HISTORY OF PRESENT ILLNESS:  Shannon Barajas is a pleasant 62 y.o. female with the below listed medical history who is followed in this office for GERD complicated by peptic stricture requiring esophageal dilation, and constipation predominant irritable bowel syndrome. She also has been seen previously for iron deficiency anemia. She underwent complete colonoscopy and upper endoscopy in June and July of 2011 respectively. Colonoscopy, including intubation of the terminal ileum, was normal except for mild sigmoid diverticulosis and internal hemorrhoids. She was complaining of rectal bleeding at that time. Upper endoscopy revealed distal esophageal stricture which was injected with triamcinolone followed by balloon dilation to 20 mm. Examination was otherwise normal. Duodenal biopsies were normal with no evidence of sprue. Subsequent capsule endoscopy revealed possible small AVM and questionable submucosal bulge. The exam was incomplete. Subsequent CT urography was unremarkable. She was to stay on PPI and symptomatically repeat for constipation. As well suppositories for hemorrhoids. She was last seen in the office 06/04/2010. At that time she complained of constipation with bloating. She had been referred at [facility] but did not have experience. We prescribed Amitiza which did not help. Nor did probiotic. She subsequently contacted the office and requested Linzess. Linzess 290 mcg daily was prescribed. She states this has helped her bowels significantly. She reports bowel movements daily. Often loose. She does not take Linzess on Saturdays, describing this as a break. Her and her husband have gone on a gluten-free diet. She is come off PPI at some point. She continues on iron. She has not been seen by her PCP recently. Presents today complaining of one month of postprandial bloating. Defecation does not relieve symptoms. She's had 5 pound weight gain. Some rectal bleeding as manifested by blood on the  tissue, occasionally bowl. No rectal pain. No nausea or vomiting. She denies recurrent dysphagia. He reports a lot of stress related to mental illness with her daughter and granddaughters. As well some stress at work.  REVIEW OF SYSTEMS:  All non-GI ROS negative except for fatigue, urinary pain and frequency, insomnia, nosebleeds   Past Medical History  Diagnosis Date  . IBS (irritable bowel syndrome)   . Raynaud's syndrome   . Hemorrhoids   . Splenomegaly   . Hiatal hernia     small   . Stricture and stenosis of esophagus   . GERD (gastroesophageal reflux disease)   . H/O: iron deficiency anemia   . Thrombocytopenia, unspecified   . Unspecified constipation   . Insomnia, unspecified   . Other dysphagia   . Chronic interstitial cystitis   . Diverticulosis     mild   . Interstitial cystitis     Past Surgical History  Procedure Laterality Date  . Breast lumpectomy    . Tubal ligation    . Esophageal dilation    . Appendectomy  2012    Social History Shannon Barajas  reports that she has never smoked. She has never used smokeless tobacco. She reports that she does not drink alcohol or use illicit drugs.  family history includes Colon cancer in her maternal grandfather; Diabetes in her maternal grandmother; and Liver cancer in an unspecified family member.  Allergies  Allergen Reactions  . Biaxin (Clarithromycin)     REACTION: aggrevates IBS  . Erythromycin     REACTION: aggrevates IBS  . Soy Allergy     May arregevate interstitial cystitis  . Zithromax (Azithromycin)        PHYSICAL EXAMINATION: Vital signs: BP 84/50  Pulse 88  Ht 5\' 7"  (1.702 m)  Wt 133 lb 2 oz (60.385 kg)  BMI 20.85 kg/m2 General: Well-developed, well-nourished, no acute distress HEENT: Sclerae are anicteric, conjunctiva pink. Oral mucosa intact Lungs: Clear Heart: Regular Abdomen: soft, nontender, nondistended, no obvious ascites, no peritoneal signs, normal bowel sounds. No  organomegaly. Extremities: No edema Psychiatric: alert and oriented x3. Cooperative   ASSESSMENT:  #1. Constipation predominant irritable bowel #2. Worsening bloating discomfort over the past month. Suspect secondary to IBS #3. GERD complicated by peptic stricture status post prior dilation 2011. No recurrent dysphagia. Currently not on PPI. #4. History of iron deficiency anemia. Likely secondary to AVMs. #5. Rectal bleeding. Due to known hemorrhoids. History of the same   PLAN:  #1. Continue Linzess #2. Abdominal ultrasound to further evaluate worsening bloating discomfort #3. CBC for history of iron deficiency anemia, comprehensive metabolic panel and TSH as well #4. Prescribe Anusol suppositories when necessary for bleeding #5. Continue iron

## 2012-04-28 ENCOUNTER — Ambulatory Visit (HOSPITAL_COMMUNITY)
Admission: RE | Admit: 2012-04-28 | Discharge: 2012-04-28 | Disposition: A | Discharge status: Home / Self Care | Payer: 59 | Source: Ambulatory Visit | Attending: Internal Medicine | Admitting: Internal Medicine

## 2012-04-28 DIAGNOSIS — R109 Unspecified abdominal pain: Secondary | ICD-10-CM | POA: Insufficient documentation

## 2012-04-28 DIAGNOSIS — R1084 Generalized abdominal pain: Secondary | ICD-10-CM

## 2012-04-28 DIAGNOSIS — K625 Hemorrhage of anus and rectum: Secondary | ICD-10-CM

## 2012-04-28 DIAGNOSIS — R161 Splenomegaly, not elsewhere classified: Secondary | ICD-10-CM | POA: Insufficient documentation

## 2012-04-28 DIAGNOSIS — K589 Irritable bowel syndrome without diarrhea: Secondary | ICD-10-CM

## 2012-05-03 ENCOUNTER — Other Ambulatory Visit: Payer: Self-pay | Admitting: *Deleted

## 2012-05-03 MED ORDER — TEMAZEPAM 30 MG PO CAPS: ORAL_CAPSULE | ORAL | Status: DC

## 2012-05-09 ENCOUNTER — Other Ambulatory Visit (INDEPENDENT_AMBULATORY_CARE_PROVIDER_SITE_OTHER): Payer: 59

## 2012-05-09 DIAGNOSIS — Z0000 Encounter for general adult medical examination without abnormal findings: Secondary | ICD-10-CM

## 2012-05-09 LAB — CBC WITH DIFFERENTIAL/PLATELET
Basophils Relative: 0.8 % (ref 0.0–3.0)
Eosinophils Relative: 0.3 % (ref 0.0–5.0)
HCT: 34 % — ABNORMAL LOW (ref 36.0–46.0)
Lymphs Abs: 3.2 10*3/uL (ref 0.7–4.0)
MCV: 83.3 fl (ref 78.0–100.0)
Monocytes Absolute: 0.4 10*3/uL (ref 0.1–1.0)
RBC: 4.09 Mil/uL (ref 3.87–5.11)
WBC: 6.5 10*3/uL (ref 4.5–10.5)

## 2012-05-09 LAB — HEPATIC FUNCTION PANEL
Albumin: 4.2 g/dL (ref 3.5–5.2)
Alkaline Phosphatase: 85 U/L (ref 39–117)
Bilirubin, Direct: 0 mg/dL (ref 0.0–0.3)

## 2012-05-09 LAB — BASIC METABOLIC PANEL
BUN: 25 mg/dL — ABNORMAL HIGH (ref 6–23)
Calcium: 9.1 mg/dL (ref 8.4–10.5)
GFR: 38.65 mL/min — ABNORMAL LOW (ref >60.00)
Glucose, Bld: 88 mg/dL (ref 70–99)

## 2012-05-09 LAB — POCT URINALYSIS DIPSTICK
Bilirubin, UA: NEGATIVE
Ketones, UA: NEGATIVE
Protein, UA: NEGATIVE

## 2012-05-09 LAB — TSH: TSH: 3.86 u[IU]/mL (ref 0.35–5.50)

## 2012-05-16 ENCOUNTER — Ambulatory Visit (INDEPENDENT_AMBULATORY_CARE_PROVIDER_SITE_OTHER): Payer: 59 | Admitting: Internal Medicine

## 2012-05-16 ENCOUNTER — Encounter: Payer: Self-pay | Admitting: Internal Medicine

## 2012-05-16 VITALS — BP 110/70 | HR 72 | Temp 98.2°F | Resp 16 | Ht 67.0 in | Wt 132.0 lb

## 2012-05-16 DIAGNOSIS — Z0000 Encounter for general adult medical examination without abnormal findings: Secondary | ICD-10-CM

## 2012-05-16 DIAGNOSIS — K5901 Slow transit constipation: Secondary | ICD-10-CM

## 2012-05-16 DIAGNOSIS — D62 Acute posthemorrhagic anemia: Secondary | ICD-10-CM

## 2012-05-16 DIAGNOSIS — N289 Disorder of kidney and ureter, unspecified: Secondary | ICD-10-CM

## 2012-05-16 MED ORDER — METOCLOPRAMIDE HCL 5 MG PO TABS: 5.0000 mg | ORAL_TABLET | Freq: Two times a day (BID) | ORAL | Status: DC

## 2012-05-16 MED ORDER — INTEGRA PLUS PO CAPS: 1.0000 | ORAL_CAPSULE | Freq: Every day | ORAL | Status: DC

## 2012-05-16 NOTE — Progress Notes (Signed)
Subjective:    Patient ID: Shannon Barajas, female    DOB: [birth year], 62 y.o.   MRN: [id number]  HPI CPX   Review of Systems  Constitutional: Negative for activity change, appetite change and fatigue.  HENT: Negative for ear pain, congestion, neck pain, postnasal drip and sinus pressure.   Eyes: Negative for redness and visual disturbance.  Respiratory: Negative for cough, shortness of breath and wheezing.   Gastrointestinal: Negative for abdominal pain and abdominal distention.  Genitourinary: Negative for dysuria, frequency and menstrual problem.  Musculoskeletal: Negative for myalgias, joint swelling and arthralgias.  Skin: Negative for rash and wound.  Neurological: Negative for dizziness, weakness and headaches.  Hematological: Negative for adenopathy. Does not bruise/bleed easily.  Psychiatric/Behavioral: Negative for sleep disturbance and decreased concentration.   Past Medical History  Diagnosis Date  . IBS (irritable bowel syndrome)   . Raynaud's syndrome   . Hemorrhoids   . Splenomegaly   . Hiatal hernia     small   . Stricture and stenosis of esophagus   . GERD (gastroesophageal reflux disease)   . H/O: iron deficiency anemia   . Thrombocytopenia, unspecified   . Unspecified constipation   . Insomnia, unspecified   . Other dysphagia   . Chronic interstitial cystitis   . Diverticulosis     mild   . Interstitial cystitis     History   Social History  . Marital Status: Married    Spouse Name: N/A    Number of Children: N/A  . Years of Education: N/A   Occupational History  . [employer]     Social History Main Topics  . Smoking status: Never Smoker   . Smokeless tobacco: Never Used  . Alcohol Use: No  . Drug Use: No  . Sexually Active: Not on file   Other Topics Concern  . Not on file   Social History Narrative  . No narrative on file    Past Surgical History  Procedure Laterality Date  . Breast lumpectomy    . Tubal ligation    .  Esophageal dilation    . Appendectomy  2012    Family History  Problem Relation Age of Onset  . Diabetes Maternal Grandmother   . Liver cancer      ?grandfather  . Colon cancer Maternal Grandfather     Allergies  Allergen Reactions  . Biaxin (Clarithromycin)     REACTION: aggrevates IBS  . Erythromycin     REACTION: aggrevates IBS  . Soy Allergy     May arregevate interstitial cystitis  . Zithromax (Azithromycin)     Current Outpatient Prescriptions on File Prior to Visit  Medication Sig Dispense Refill  . CALCIUM CITRATE-VITAMIN D PO Take 2 tablets by mouth daily.      . cholecalciferol (VITAMIN D) 1000 UNITS tablet Take 1,000 Units by mouth daily.      Marland Kitchen estradiol (ESTRACE) 2 MG tablet Take 2 mg by mouth daily.        . ferrous sulfate 325 (65 FE) MG tablet Take 325 mg by mouth at bedtime.        . hydrocortisone (ANUSOL-HC) 25 MG suppository Place 1 suppository (25 mg total) rectally at bedtime as needed for hemorrhoids.  30 suppository  3  . LINZESS 290 MCG CAPS TAKE 1 CAPSULE BY MOUTH DAILY BEFORE BREAKFAST  30 capsule  PRN  . magnesium 30 MG tablet Take 30 mg by mouth daily.      . temazepam (RESTORIL)  30 MG capsule TAKE 2 CAPSULES BY MOUTH AT BEDTIME  180 capsule  3   No current facility-administered medications on file prior to visit.    BP 110/70  Pulse 72  Temp(Src) 98.2 F (36.8 C)  Resp 16  Ht 5\' 7"  (1.702 m)  Wt 132 lb (59.875 kg)  BMI 20.67 kg/m2       Objective:   Physical Exam  Nursing note and vitals reviewed. Constitutional: She is oriented to person, place, and time. She appears well-developed and well-nourished. No distress.  HENT:  Head: Normocephalic and atraumatic.  Eyes: Conjunctivae and EOM are normal. Pupils are equal, round, and reactive to light.  Neck: Normal range of motion. Neck supple. No JVD present. No tracheal deviation present. No thyromegaly present.  Cardiovascular: Normal rate and regular rhythm.   Murmur  heard. Pulmonary/Chest: Effort normal and breath sounds normal. She has no wheezes. She exhibits no tenderness.  Abdominal: Soft. Bowel sounds are normal.  Musculoskeletal: Normal range of motion. She exhibits no edema and no tenderness.  Lymphadenopathy:    She has no cervical adenopathy.  Neurological: She is alert and oriented to person, place, and time. She has normal reflexes. No cranial nerve deficit.  Skin: Skin is warm and dry. She is not diaphoretic.  Psychiatric: She has a normal mood and affect. Her behavior is normal.          Assessment & Plan:   This is a routine physical examination for this healthy  Female. Reviewed all health maintenance protocols including mammography colonoscopy bone density and reviewed appropriate screening labs. Her immunization history was reviewed as well as her current medications and allergies refills of her chronic medications were given and the plan for yearly health maintenance was discussed all orders and referrals were made as appropriate.   trial of Integra iron Low dose reglan 5 mg BID

## 2012-05-16 NOTE — Patient Instructions (Addendum)
New iron capsule.  please report back whether not you can tolerate it.  We are trying low-dose Reglan if you develop any neurological symptoms such as a fine motor tremor ,stop the Reglan and call my office or notify me via my chart

## 2012-05-17 LAB — BASIC METABOLIC PANEL
CO2: 30 mEq/L (ref 19–32)
Chloride: 107 mEq/L (ref 96–112)
Creatinine, Ser: 1.4 mg/dL — ABNORMAL HIGH (ref 0.4–1.2)
Potassium: 5.2 mEq/L — ABNORMAL HIGH (ref 3.5–5.1)

## 2012-05-22 ENCOUNTER — Encounter: Payer: Self-pay | Admitting: Internal Medicine

## 2012-05-23 ENCOUNTER — Other Ambulatory Visit: Payer: Self-pay | Admitting: *Deleted

## 2012-05-23 DIAGNOSIS — K5901 Slow transit constipation: Secondary | ICD-10-CM

## 2012-05-23 DIAGNOSIS — D62 Acute posthemorrhagic anemia: Secondary | ICD-10-CM

## 2012-05-23 MED ORDER — INTEGRA PLUS PO CAPS: 1.0000 | ORAL_CAPSULE | Freq: Every day | ORAL | Status: DC

## 2012-05-23 MED ORDER — METOCLOPRAMIDE HCL 5 MG PO TABS: 5.0000 mg | ORAL_TABLET | Freq: Two times a day (BID) | ORAL | Status: DC

## 2012-06-16 ENCOUNTER — Telehealth: Payer: Self-pay | Admitting: Internal Medicine

## 2012-06-16 DIAGNOSIS — D62 Acute posthemorrhagic anemia: Secondary | ICD-10-CM

## 2012-06-16 MED ORDER — INTEGRA PLUS PO CAPS: 1.0000 | ORAL_CAPSULE | Freq: Every day | ORAL | Status: DC

## 2012-06-16 NOTE — Telephone Encounter (Signed)
Pt was given samples of FeFum-FePoly-FA-B Cmp-C-Biot (INTEGRA PLUS) CAPS at her last visit. Pt said if she did well, MD would call in RX. Pt would like this called into [facility]/ [address]

## 2012-06-16 NOTE — Telephone Encounter (Signed)
Sent to pharmacy as requested 

## 2012-06-21 ENCOUNTER — Other Ambulatory Visit: Payer: Self-pay | Admitting: *Deleted

## 2012-06-21 MED ORDER — INTEGRA 62.5-62.5-40-3 MG PO CAPS: 1.0000 | ORAL_CAPSULE | Freq: Every day | ORAL | Status: DC

## 2012-07-01 ENCOUNTER — Other Ambulatory Visit: Payer: Self-pay

## 2012-07-18 ENCOUNTER — Telehealth: Payer: Self-pay | Admitting: Internal Medicine

## 2012-07-18 DIAGNOSIS — K5901 Slow transit constipation: Secondary | ICD-10-CM

## 2012-07-18 MED ORDER — METOCLOPRAMIDE HCL 5 MG PO TABS: 5.0000 mg | ORAL_TABLET | Freq: Two times a day (BID) | ORAL | Status: DC

## 2012-07-18 MED ORDER — LINACLOTIDE 290 MCG PO CAPS: 1.0000 | ORAL_CAPSULE | Freq: Every day | ORAL | Status: DC

## 2012-07-18 NOTE — Telephone Encounter (Signed)
Pt needs refill of metoCLOPramide (REGLAN) 5 MG tablet RX written for 90 days. Pt only had 30 day previously.. Pt would like to know if Dr Lovell Sheehan would RX her script for [facility] 290 MCG CAPS? Pt states was done by Dr Marina Goodell, but pt does not need to return to Dr Marina Goodell.  Also a 90 day supply. [facility] [address]

## 2012-07-18 NOTE — Telephone Encounter (Signed)
This was done.

## 2012-07-28 ENCOUNTER — Other Ambulatory Visit: Payer: Self-pay | Admitting: Internal Medicine

## 2012-07-29 ENCOUNTER — Other Ambulatory Visit: Payer: Self-pay | Admitting: *Deleted

## 2012-10-21 ENCOUNTER — Telehealth: Payer: Self-pay | Admitting: Internal Medicine

## 2012-10-21 MED ORDER — CELECOXIB 200 MG PO CAPS: 200.0000 mg | ORAL_CAPSULE | Freq: Two times a day (BID) | ORAL | Status: DC

## 2012-10-21 NOTE — Telephone Encounter (Signed)
Pt has been bothered by arthitis and has tried her husband's celebrex, which has helped. Pt would like to know if Dr Lovell Sheehan would call her in a rx for celebrex. [facility]

## 2012-10-21 NOTE — Telephone Encounter (Signed)
Done-ok per dr Lovell Sheehan

## 2012-10-25 ENCOUNTER — Telehealth: Payer: Self-pay | Admitting: Internal Medicine

## 2012-10-25 NOTE — Telephone Encounter (Signed)
Pt would like to know if we have any cards for the $4 celebrex rx.  Pt's husband got one last week.

## 2012-10-25 NOTE — Telephone Encounter (Signed)
Disregard message that is too early- no coupones avaiable- I suggested she go on line and find

## 2012-10-25 NOTE — Telephone Encounter (Signed)
Too early

## 2012-11-24 ENCOUNTER — Other Ambulatory Visit: Payer: Self-pay

## 2012-11-25 ENCOUNTER — Other Ambulatory Visit (HOSPITAL_COMMUNITY): Payer: Self-pay | Admitting: Obstetrics & Gynecology

## 2012-11-25 DIAGNOSIS — Z1231 Encounter for screening mammogram for malignant neoplasm of breast: Secondary | ICD-10-CM

## 2012-12-22 ENCOUNTER — Ambulatory Visit (HOSPITAL_COMMUNITY): Payer: 59

## 2012-12-27 ENCOUNTER — Ambulatory Visit (HOSPITAL_COMMUNITY): Payer: 59

## 2013-01-16 ENCOUNTER — Other Ambulatory Visit: Payer: Self-pay | Admitting: Internal Medicine

## 2013-01-31 ENCOUNTER — Ambulatory Visit (HOSPITAL_COMMUNITY): Payer: 59

## 2013-02-03 ENCOUNTER — Encounter: Payer: Self-pay | Admitting: *Deleted

## 2013-02-06 ENCOUNTER — Ambulatory Visit: Payer: 59 | Admitting: Internal Medicine

## 2013-02-10 ENCOUNTER — Ambulatory Visit (INDEPENDENT_AMBULATORY_CARE_PROVIDER_SITE_OTHER): Payer: 59 | Admitting: Internal Medicine

## 2013-02-10 ENCOUNTER — Encounter: Payer: Self-pay | Admitting: Internal Medicine

## 2013-02-10 VITALS — BP 126/76 | HR 72 | Temp 98.0°F | Resp 16 | Ht 67.0 in | Wt 139.0 lb

## 2013-02-10 DIAGNOSIS — K559 Vascular disorder of intestine, unspecified: Secondary | ICD-10-CM

## 2013-02-10 DIAGNOSIS — R1013 Epigastric pain: Secondary | ICD-10-CM

## 2013-02-10 DIAGNOSIS — R300 Dysuria: Secondary | ICD-10-CM

## 2013-02-10 LAB — COMPREHENSIVE METABOLIC PANEL
ALK PHOS: 94 U/L (ref 39–117)
ALT: 16 U/L (ref 0–35)
AST: 21 U/L (ref 0–37)
Albumin: 4.1 g/dL (ref 3.5–5.2)
BILIRUBIN TOTAL: 0.7 mg/dL (ref 0.3–1.2)
BUN: 28 mg/dL — AB (ref 6–23)
CO2: 30 mEq/L (ref 19–32)
CREATININE: 1.2 mg/dL (ref 0.4–1.2)
Calcium: 9.6 mg/dL (ref 8.4–10.5)
Chloride: 103 mEq/L (ref 96–112)
GFR: 46.19 mL/min — ABNORMAL LOW (ref >60.00)
Glucose, Bld: 81 mg/dL (ref 70–99)
Potassium: 3.8 mEq/L (ref 3.5–5.1)
Sodium: 140 mEq/L (ref 135–145)
Total Protein: 6.4 g/dL (ref 6.0–8.3)

## 2013-02-10 LAB — CBC WITH DIFFERENTIAL/PLATELET
BASOS PCT: 0.8 % (ref 0.0–3.0)
Basophils Absolute: 0 10*3/uL (ref 0.0–0.1)
EOS ABS: 0 10*3/uL (ref 0.0–0.7)
Eosinophils Relative: 0.1 % (ref 0.0–5.0)
HEMATOCRIT: 36 % (ref 36.0–46.0)
HEMOGLOBIN: 12.2 g/dL (ref 12.0–15.0)
LYMPHS ABS: 2.9 10*3/uL (ref 0.7–4.0)
LYMPHS PCT: 48.3 % — AB (ref 12.0–46.0)
MCHC: 34 g/dL (ref 30.0–36.0)
MCV: 85.8 fl (ref 78.0–100.0)
MONO ABS: 0.2 10*3/uL (ref 0.1–1.0)
Monocytes Relative: 3.9 % (ref 3.0–12.0)
NEUTROS ABS: 2.8 10*3/uL (ref 1.4–7.7)
Neutrophils Relative %: 46.9 % (ref 43.0–77.0)
Platelets: 98 10*3/uL — ABNORMAL LOW (ref 150.0–400.0)
RBC: 4.19 Mil/uL (ref 3.87–5.11)
RDW: 13.6 % (ref 11.5–14.6)
WBC: 6 10*3/uL (ref 4.5–10.5)

## 2013-02-10 LAB — POCT URINALYSIS DIPSTICK
Bilirubin, UA: NEGATIVE
GLUCOSE UA: NEGATIVE
Ketones, UA: NEGATIVE
NITRITE UA: NEGATIVE
PH UA: 5
Protein, UA: NEGATIVE
Spec Grav, UA: <=1.005
UROBILINOGEN UA: 0.2

## 2013-02-10 LAB — SEDIMENTATION RATE: Sed Rate: 11 mm/hr (ref 0–22)

## 2013-02-10 NOTE — Addendum Note (Signed)
Addended by: Allyne Gee on: 02/10/2013 03:23 PM   Modules accepted: Orders

## 2013-02-10 NOTE — Progress Notes (Signed)
Pre visit review using our clinic review tool, if applicable. No additional management support is needed unless otherwise documented below in the visit note. 

## 2013-02-10 NOTE — Progress Notes (Signed)
Subjective:    Patient ID: Shannon Barajas, female    DOB: [birth year], 63 y.o.   MRN: [id number]  HPI Patient has had 2-3 episodes of severe abdominal pain followed by nausea and vomiting the vomiting persisted until she had dry heaves lasted for several hours each episode she had one episode in December and one episode in January.  She has a history of esophageal stricture and a history of irritable bowel she has been followed by both our gastroenterology.  Recently she had seen a gastroenterologist and had a conversation which she interpreted as the provider "giving up on her"   Review of Systems  Constitutional: Positive for diaphoresis. Negative for fever, fatigue and unexpected weight change.  HENT: Negative.   Respiratory: Negative.   Gastrointestinal: Positive for abdominal pain and abdominal distention.  Endocrine: Negative.   Genitourinary: Negative.        Past Medical History  Diagnosis Date  . IBS (irritable bowel syndrome)   . Raynaud's syndrome   . Hemorrhoids   . Splenomegaly   . Hiatal hernia     small   . Stricture and stenosis of esophagus   . GERD (gastroesophageal reflux disease)   . H/O: iron deficiency anemia   . Thrombocytopenia, unspecified   . Unspecified constipation   . Insomnia, unspecified   . Other dysphagia   . Chronic interstitial cystitis   . Diverticulosis     mild   . Interstitial cystitis     History   Social History  . Marital Status: Married    Spouse Name: N/A    Number of Children: N/A  . Years of Education: N/A   Occupational History  . [employer]     Social History Main Topics  . Smoking status: Never Smoker   . Smokeless tobacco: Never Used  . Alcohol Use: No  . Drug Use: No  . Sexual Activity: Not on file   Other Topics Concern  . Not on file   Social History Narrative  . No narrative on file    Past Surgical History  Procedure Laterality Date  . Breast lumpectomy    . Tubal ligation    .  Esophageal dilation    . Appendectomy  2012    Family History  Problem Relation Age of Onset  . Diabetes Maternal Grandmother   . Liver cancer      ?grandfather  . Colon cancer Maternal Grandfather     Allergies  Allergen Reactions  . Biaxin [Clarithromycin]     REACTION: aggrevates IBS  . Erythromycin     REACTION: aggrevates IBS  . Soy Allergy     May arregevate interstitial cystitis  . Zithromax [Azithromycin]     Current Outpatient Prescriptions on File Prior to Visit  Medication Sig Dispense Refill  . CALCIUM CITRATE-VITAMIN D PO Take 2 tablets by mouth daily.      . celecoxib (CELEBREX) 200 MG capsule Take 1 capsule (200 mg total) by mouth 2 (two) times daily.  60 capsule  3  . cholecalciferol (VITAMIN D) 1000 UNITS tablet Take 1,000 Units by mouth daily.      Marland Kitchen estradiol (ESTRACE) 2 MG tablet Take 2 mg by mouth daily.        . Fe Fum-FePoly-Vit C-Vit B3 (INTEGRA) 62.5-62.5-40-3 MG CAPS Take 1 capsule by mouth daily.  90 capsule  3  . hydrocortisone (ANUSOL-HC) 25 MG suppository Place 1 suppository (25 mg total) rectally at bedtime as needed for hemorrhoids.  30 suppository  3  . Linaclotide (LINZESS) 290 MCG CAPS Take 1 capsule by mouth daily before breakfast.  90 capsule  3  . magnesium 30 MG tablet Take 30 mg by mouth daily.      . metoCLOPramide (REGLAN) 5 MG tablet Take 1 tablet (5 mg total) by mouth 2 (two) times daily at 10 am and 4 pm.  180 tablet  3  . temazepam (RESTORIL) 30 MG capsule TAKE 2 CAPSULES BY MOUTH AT BEDTIME  180 capsule  1   No current facility-administered medications on file prior to visit.    BP 126/76  Pulse 72  Temp(Src) 98 F (36.7 C)  Resp 16  Ht 5\' 7"  (1.702 m)  Wt 139 lb (63.05 kg)  BMI 21.77 kg/m2    Objective:   Physical Exam  Constitutional: She is oriented to person, place, and time. She appears well-developed and well-nourished. No distress.  HENT:  Head: Normocephalic and atraumatic.  Right Ear: External ear normal.    Left Ear: External ear normal.  Nose: Nose normal.  Mouth/Throat: Oropharynx is clear and moist.  Eyes: Conjunctivae and EOM are normal. Pupils are equal, round, and reactive to light.  Neck: Normal range of motion. Neck supple. No JVD present. No tracheal deviation present. No thyromegaly present.  Cardiovascular: Normal rate and regular rhythm.   No murmur heard. Pulmonary/Chest: Effort normal and breath sounds normal. She has no wheezes. She exhibits no tenderness.  Abdominal: She exhibits no mass. There is tenderness. There is no rebound and no guarding.  Musculoskeletal: Normal range of motion. She exhibits no edema and no tenderness.  Lymphadenopathy:    She has no cervical adenopathy.  Neurological: She is alert and oriented to person, place, and time. She has normal reflexes. No cranial nerve deficit.  Skin: Skin is warm and dry. She is not diaphoretic.  Psychiatric: She has a normal mood and affect. Her behavior is normal.          Assessment & Plan:  Differential diagnosis is broad including possible gallbladder disease Biliary colic Ischemic bowel And pyloric Channel gastritis or pre-ulcerative condition    We will refer her to gastroenterology outside of her current group.  I believe an EGD would be part of the workup we will obtain appropriate laboratory screening including a sedimentation rate to eliminate vasculitis and a white count to look for ischemic bowel  I believe a CT angiogram would be a most complete study

## 2013-02-10 NOTE — Patient Instructions (Signed)
The patient is instructed to continue all medications as prescribed. Schedule followup with check out clerk upon leaving the clinic  

## 2013-02-12 LAB — URINE CULTURE: Colony Count: 90000

## 2013-02-14 ENCOUNTER — Ambulatory Visit (HOSPITAL_COMMUNITY)
Admission: RE | Admit: 2013-02-14 | Discharge: 2013-02-14 | Disposition: A | Discharge status: Home / Self Care | Payer: 59 | Source: Ambulatory Visit | Attending: Obstetrics & Gynecology | Admitting: Obstetrics & Gynecology

## 2013-02-14 DIAGNOSIS — Z1231 Encounter for screening mammogram for malignant neoplasm of breast: Secondary | ICD-10-CM | POA: Insufficient documentation

## 2013-02-15 ENCOUNTER — Other Ambulatory Visit: Payer: Self-pay | Admitting: *Deleted

## 2013-02-15 MED ORDER — AMOXICILLIN 500 MG PO CAPS: 500.0000 mg | ORAL_CAPSULE | Freq: Three times a day (TID) | ORAL | Status: DC

## 2013-02-21 ENCOUNTER — Other Ambulatory Visit: Payer: 59

## 2013-02-23 ENCOUNTER — Other Ambulatory Visit: Payer: 59

## 2013-02-23 ENCOUNTER — Ambulatory Visit (INDEPENDENT_AMBULATORY_CARE_PROVIDER_SITE_OTHER)
Admission: RE | Admit: 2013-02-23 | Discharge: 2013-02-23 | Disposition: A | Discharge status: Home / Self Care | Payer: 59 | Source: Ambulatory Visit | Attending: Internal Medicine | Admitting: Internal Medicine

## 2013-02-23 ENCOUNTER — Telehealth: Payer: Self-pay | Admitting: *Deleted

## 2013-02-23 DIAGNOSIS — R1013 Epigastric pain: Secondary | ICD-10-CM

## 2013-02-23 DIAGNOSIS — K559 Vascular disorder of intestine, unspecified: Secondary | ICD-10-CM

## 2013-02-23 MED ORDER — IOHEXOL 350 MG/ML SOLN
100.0000 mL | Freq: Once | INTRAVENOUS | Status: AC | PRN
  Administered 2013-02-23: 100 mL via INTRAVENOUS

## 2013-02-23 NOTE — Telephone Encounter (Signed)
Rose called and needs to know what you are looking for  her in ct.  Please let rose know  [phone]

## 2013-02-24 ENCOUNTER — Telehealth: Payer: Self-pay | Admitting: *Deleted

## 2013-02-24 NOTE — Telephone Encounter (Signed)
q 

## 2013-02-24 NOTE — Telephone Encounter (Signed)
intermittent ischemic bowel

## 2013-03-02 ENCOUNTER — Telehealth: Payer: Self-pay | Admitting: Internal Medicine

## 2013-03-02 NOTE — Telephone Encounter (Signed)
Patient Information:  Caller Name: Caylynn  Phone: [phone]  Patient: Shannon Barajas, Shannon Barajas  Gender: Female  DOB: 12-Jun-1950  Age: 63 Years  PCP: Benay Pillow (Adults only)  Office Follow Up:  Does the office need to follow up with this patient?: Yes  Instructions For The Office: Please call 03/03/13 to advise if MD will order antibiotic without visit.  RN Note:  After informed of MD orders regarding must be seen for antibiotic, reported she does not want to make appointment due to penalty for lost time at work. Pharmacy is [facility].  Informed Dr Arnoldo Morale not in office 03/03/13. No appointment remain in office for 03/03/13; may go to [place].  Stated will wait until 03/03/13 to see if Dr Arnoldo Morale is willing to order additional antibiotics.  Please call back 03/03/13 with MD response.  Symptoms  Reason For Call & Symptoms: Urinary frequency, urgency and odor.  Called to request another round of Amoxicillin.  Last treated 02/10/13 for 5 days for strep in urine.  Reviewed Health History In EMR: Yes  Reviewed Medications In EMR: Yes  Reviewed Allergies In EMR: Yes  Reviewed Surgeries / Procedures: Yes  Date of Onset of Symptoms: 02/28/2013  Treatments Tried: [place]  Treatments Tried Worked: Yes  Guideline(s) Used:  Urination Pain - Female  Disposition Per Guideline:   See Today in Office  Reason For Disposition Reached:   > 2 UTIs in last year  Advice Given:  Fluids:   Drink extra fluids. Drink 8-10 glasses of liquids a day (Reason: to produce a dilute, non-irritating urine).  Warm Saline SITZ Baths to Reduce Pain:  Sit in a warm saline bath for 20 minutes to cleanse the area and to reduce pain. Add 2 oz. of table salt or baking soda to a tub of water.  Call Back If:  You become worse.  Patient Refused Recommendation:  Patient Requests Prescription  Please call back 03/03/13 to advise if Dr Arnoldo Morale will order antibiotic without office visit.

## 2013-03-03 ENCOUNTER — Other Ambulatory Visit: Payer: Self-pay | Admitting: *Deleted

## 2013-03-03 MED ORDER — AMOXICILLIN 500 MG PO CAPS: 500.0000 mg | ORAL_CAPSULE | Freq: Three times a day (TID) | ORAL | Status: DC

## 2013-03-03 NOTE — Telephone Encounter (Signed)
Ok to refill for 7 more days- sent to pharmacy and pt informed

## 2013-04-03 ENCOUNTER — Telehealth: Payer: Self-pay | Admitting: Hematology and Oncology

## 2013-04-03 NOTE — Telephone Encounter (Signed)
S/W PATIENT AND GAVE NEW PATIENT APPT FOR 04/14 @ 2:30 W/DR. Shannon [place].  DX- DEC'D PLTS

## 2013-04-03 NOTE — Telephone Encounter (Signed)
C/D 04/03/13 for appt. 05/02/13

## 2013-04-13 ENCOUNTER — Other Ambulatory Visit: Payer: Self-pay

## 2013-04-19 DIAGNOSIS — C8590 Non-Hodgkin lymphoma, unspecified, unspecified site: Secondary | ICD-10-CM

## 2013-04-19 HISTORY — DX: Non-Hodgkin lymphoma, unspecified, unspecified site: C85.90

## 2013-04-21 ENCOUNTER — Encounter (HOSPITAL_COMMUNITY): Payer: Self-pay | Admitting: Emergency Medicine

## 2013-04-21 ENCOUNTER — Emergency Department (HOSPITAL_COMMUNITY)
Admission: EM | Admit: 2013-04-21 | Discharge: 2013-04-21 | Disposition: A | Discharge status: Home / Self Care | Payer: 59 | Source: Home / Self Care | Attending: Emergency Medicine | Admitting: Emergency Medicine

## 2013-04-21 DIAGNOSIS — C8589 Other specified types of non-Hodgkin lymphoma, extranodal and solid organ sites: Secondary | ICD-10-CM

## 2013-04-21 DIAGNOSIS — R944 Abnormal results of kidney function studies: Secondary | ICD-10-CM

## 2013-04-21 DIAGNOSIS — H6120 Impacted cerumen, unspecified ear: Secondary | ICD-10-CM

## 2013-04-21 DIAGNOSIS — F411 Generalized anxiety disorder: Secondary | ICD-10-CM

## 2013-04-21 DIAGNOSIS — D649 Anemia, unspecified: Secondary | ICD-10-CM

## 2013-04-21 DIAGNOSIS — R161 Splenomegaly, not elsewhere classified: Secondary | ICD-10-CM

## 2013-04-21 DIAGNOSIS — E871 Hypo-osmolality and hyponatremia: Secondary | ICD-10-CM

## 2013-04-21 DIAGNOSIS — Z5112 Encounter for antineoplastic immunotherapy: Secondary | ICD-10-CM

## 2013-04-21 DIAGNOSIS — Z23 Encounter for immunization: Secondary | ICD-10-CM

## 2013-04-21 DIAGNOSIS — K589 Irritable bowel syndrome without diarrhea: Secondary | ICD-10-CM

## 2013-04-21 DIAGNOSIS — K648 Other hemorrhoids: Secondary | ICD-10-CM

## 2013-04-21 DIAGNOSIS — K7689 Other specified diseases of liver: Secondary | ICD-10-CM

## 2013-04-21 DIAGNOSIS — C8307 Small cell B-cell lymphoma, spleen: Secondary | ICD-10-CM

## 2013-04-21 DIAGNOSIS — K644 Residual hemorrhoidal skin tags: Secondary | ICD-10-CM

## 2013-04-21 DIAGNOSIS — G4700 Insomnia, unspecified: Secondary | ICD-10-CM

## 2013-04-21 DIAGNOSIS — R609 Edema, unspecified: Secondary | ICD-10-CM

## 2013-04-21 DIAGNOSIS — D472 Monoclonal gammopathy: Secondary | ICD-10-CM

## 2013-04-21 DIAGNOSIS — N189 Chronic kidney disease, unspecified: Secondary | ICD-10-CM

## 2013-04-21 DIAGNOSIS — D696 Thrombocytopenia, unspecified: Secondary | ICD-10-CM

## 2013-04-21 NOTE — Discharge Instructions (Signed)
Shannon Barajas,   I am glad that your ears are feeling better. I would take some alleve or ibuprofen, because they may be sore after the cleaning. You do not have any signs of infection, so no antibiotics are needed.   Take Care,   Dr. Maricela Bo

## 2013-04-21 NOTE — ED Notes (Signed)
C/o 2 week duration of pain in ear, not resolved w home treatment

## 2013-04-21 NOTE — ED Provider Notes (Signed)
Medical screening examination/treatment/procedure(s) were performed by a resident physician and as supervising physician I was immediately available for consultation/collaboration.  Philipp Deputy, M.D.   Harden Mo, MD 04/21/13 2129

## 2013-04-21 NOTE — ED Provider Notes (Signed)
CSN: [id number]     Arrival date & time 04/21/13  1709 History   First MD Initiated Contact with Patient 04/21/13 1802     Chief Complaint  Patient presents with  . Otalgia   (Consider location/radiation/quality/duration/timing/severity/associated sxs/prior Treatment) HPI  Ear pain. Bilateral with left worse than right. 3 weeks' duration. No trauma. Patient also denies any drainage or bleeding from the ear. She denies any fever or chills. No history of ear surgery. She has recently taken amoxicillin for a urinary tract infection, but this was prior to her ear pain.   Past Medical History  Diagnosis Date  . IBS (irritable bowel syndrome)   . Raynaud's syndrome   . Hemorrhoids   . Splenomegaly   . Hiatal hernia     small   . Stricture and stenosis of esophagus   . GERD (gastroesophageal reflux disease)   . H/O: iron deficiency anemia   . Thrombocytopenia, unspecified   . Unspecified constipation   . Insomnia, unspecified   . Other dysphagia   . Chronic interstitial cystitis   . Diverticulosis     mild   . Interstitial cystitis    Past Surgical History  Procedure Laterality Date  . Breast lumpectomy    . Tubal ligation    . Esophageal dilation    . Appendectomy  2012   Family History  Problem Relation Age of Onset  . Diabetes Maternal Grandmother   . Liver cancer      ?grandfather  . Colon cancer Maternal Grandfather    History  Substance Use Topics  . Smoking status: Never Smoker   . Smokeless tobacco: Never Used  . Alcohol Use: No   OB History   Grav Para Term Preterm Abortions TAB SAB Ect Mult Living                 Review of Systems Negative for fever, chills, jaw pain, neck pain, difficulty swallowing Allergies  Biaxin; Erythromycin; Soy allergy; and Zithromax  Home Medications   Current Outpatient Rx  Name  Route  Sig  Dispense  Refill  . CALCIUM CITRATE-VITAMIN D PO   Oral   Take 2 tablets by mouth daily.         . celecoxib (CELEBREX) 200 MG  capsule   Oral   Take 1 capsule (200 mg total) by mouth 2 (two) times daily.   60 capsule   3   . cholecalciferol (VITAMIN D) 1000 UNITS tablet   Oral   Take 1,000 Units by mouth daily.         Marland Kitchen estradiol (ESTRACE) 2 MG tablet   Oral   Take 2 mg by mouth daily.           . Fe Fum-FePoly-Vit C-Vit B3 (INTEGRA) 62.5-62.5-40-3 MG CAPS   Oral   Take 1 capsule by mouth daily.   90 capsule   3   . hydrocortisone (ANUSOL-HC) 25 MG suppository   Rectal   Place 1 suppository (25 mg total) rectally at bedtime as needed for hemorrhoids.   30 suppository   3   . Linaclotide (LINZESS) 290 MCG CAPS   Oral   Take 1 capsule by mouth daily before breakfast.   90 capsule   3   . magnesium 30 MG tablet   Oral   Take 30 mg by mouth daily.         . metoCLOPramide (REGLAN) 5 MG tablet   Oral   Take 1 tablet (5 mg total)  by mouth 2 (two) times daily at 10 am and 4 pm.   180 tablet   3   . temazepam (RESTORIL) 30 MG capsule      TAKE 2 CAPSULES BY MOUTH AT BEDTIME   180 capsule   1   . amoxicillin (AMOXIL) 500 MG capsule   Oral   Take 1 capsule (500 mg total) by mouth 3 (three) times daily.   21 capsule   0    BP 114/70  Pulse 93  Temp(Src) 98.1 F (36.7 C) (Oral)  Resp 20  SpO2 97% Physical Exam Gen. Middle-aged white female, nodular-appearing, pleasant Ears: cerumen impaction bilaterally, no otalgia, for sure impaction cleared, tympanic membranes were reflective without effusions and no erythema the external ear canal Oropharynx: moist, clear, no exudates, normal dentition TMJ: no tenderness or swelling ED Course  Procedures (including critical care time) Labs Review Labs Reviewed - No data to display Imaging Review No results found.   MDM   1. Cerumen impaction    Cleaned with irriagation. Tolerated well. No concern for infection.     Angelica Ran, MD 04/21/13 [place]

## 2013-05-02 ENCOUNTER — Encounter: Payer: Self-pay | Admitting: Hematology and Oncology

## 2013-05-02 ENCOUNTER — Ambulatory Visit (HOSPITAL_BASED_OUTPATIENT_CLINIC_OR_DEPARTMENT_OTHER): Payer: 59 | Admitting: Hematology and Oncology

## 2013-05-02 ENCOUNTER — Telehealth: Payer: Self-pay | Admitting: *Deleted

## 2013-05-02 ENCOUNTER — Ambulatory Visit (HOSPITAL_BASED_OUTPATIENT_CLINIC_OR_DEPARTMENT_OTHER): Payer: 59

## 2013-05-02 ENCOUNTER — Telehealth: Payer: Self-pay | Admitting: Hematology and Oncology

## 2013-05-02 VITALS — BP 120/45 | HR 72 | Temp 97.4°F | Resp 18 | Ht 67.0 in | Wt 140.2 lb

## 2013-05-02 DIAGNOSIS — R161 Splenomegaly, not elsewhere classified: Secondary | ICD-10-CM

## 2013-05-02 DIAGNOSIS — K7689 Other specified diseases of liver: Secondary | ICD-10-CM

## 2013-05-02 DIAGNOSIS — D472 Monoclonal gammopathy: Secondary | ICD-10-CM

## 2013-05-02 DIAGNOSIS — R634 Abnormal weight loss: Secondary | ICD-10-CM

## 2013-05-02 DIAGNOSIS — D696 Thrombocytopenia, unspecified: Secondary | ICD-10-CM

## 2013-05-02 DIAGNOSIS — D509 Iron deficiency anemia, unspecified: Secondary | ICD-10-CM

## 2013-05-02 NOTE — Telephone Encounter (Signed)
Left VM for Short Stay to schedule BMBx for either 4/17 or 4/20 per Dr. Calton Dach request.

## 2013-05-02 NOTE — Telephone Encounter (Signed)
gv adn printed appt sched and avs for pt for April.Marland KitchenMarland KitchenMD wanted to wait for lab same time at [place]

## 2013-05-02 NOTE — Progress Notes (Signed)
[place] NOTE  Patient Care Team: Ricard Dillon, MD as PCP - General  CHIEF COMPLAINTS/PURPOSE OF CONSULTATION:  Progressive thrombocytopenia, hepatosplenomegaly  HISTORY OF PRESENTING ILLNESS:  Shannon Barajas 63 y.o. female is here because of abnormal blood work, detected on routine visit. On review of her blood work dated back to 2010, she had chronic thrombocytopenia with platelet count usually above 100,000. On 02/10/2013, her platelet count now has started to drop to 98,000. She was seen by not hematologist around 11/06/2009 and had numerous tests done to rule out connective tissue disease and cryoglobulinemia. At that time review, she has IgG lambda of unknown significance.  This patient had an interesting history with polyarthralgias and a weird constellation of symptoms consists of esophageal stricture with progressive dysphagia (she had over 10 esophageal dilatations performed for this), Raynaud's phenomenon, irritable bowel syndrome, chronic interstitial cystitis as well as easy bruising. Due to recurrent abdominal pain she had multiple imaging studies performed for evaluation. The most recent CT angiogram of the abdomen and pelvis dated 02/23/2013 show progressive hepatosplenomegaly. In terms of bruising tendency, she had multiple skin bruises usually in her arms. She also had passage of bright red blood per rectum in her stools several times a week. Her last colonoscopy revealed internal hemorrhoids. The patient is also gaining some weight.  She has history of iron deficiency anemia and has been placed on iron supplement for years. Prior biopsies to rule out celiac disease came back unremarkable. She has strong family history of lymphoma. MEDICAL HISTORY:  Past Medical History  Diagnosis Date  . IBS (irritable bowel syndrome)   . Raynaud's syndrome   . Hemorrhoids   . Splenomegaly   . Hiatal hernia     small   . Stricture and stenosis of  esophagus   . GERD (gastroesophageal reflux disease)   . H/O: iron deficiency anemia   . Thrombocytopenia, unspecified   . Unspecified constipation   . Insomnia, unspecified   . Other dysphagia   . Chronic interstitial cystitis   . Diverticulosis     mild   . Interstitial cystitis     SURGICAL HISTORY: Past Surgical History  Procedure Laterality Date  . Breast lumpectomy    . Tubal ligation    . Esophageal dilation    . Appendectomy  2012    SOCIAL HISTORY: History   Social History  . Marital Status: Married    Spouse Name: N/A    Number of Children: N/A  . Years of Education: N/A   Occupational History  . [employer]     Social History Main Topics  . Smoking status: Never Smoker   . Smokeless tobacco: Never Used  . Alcohol Use: No  . Drug Use: No  . Sexual Activity: Not on file   Other Topics Concern  . Not on file   Social History Narrative  . No narrative on file    FAMILY HISTORY: Family History  Problem Relation Age of Onset  . Diabetes Maternal Grandmother   . Liver cancer      ?grandfather  . Colon cancer Maternal Grandfather   . Cancer Maternal Grandfather     liver ca  . Cancer Father     lymphoma    ALLERGIES:  is allergic to biaxin; erythromycin; soy allergy; and zithromax.  MEDICATIONS:  Current Outpatient Prescriptions  Medication Sig Dispense Refill  . estradiol (ESTRACE) 2 MG tablet Take 2 mg by mouth daily.        Marland Kitchen  Fe Fum-FePoly-Vit C-Vit B3 (INTEGRA) 62.5-62.5-40-3 MG CAPS Take 1 capsule by mouth daily.  90 capsule  3  . Linaclotide (LINZESS) 290 MCG CAPS Take 1 capsule by mouth daily before breakfast.  90 capsule  3  . magnesium 30 MG tablet Take 30 mg by mouth daily.      . metoCLOPramide (REGLAN) 5 MG tablet Take 1 tablet (5 mg total) by mouth 2 (two) times daily at 10 am and 4 pm.  180 tablet  3  . temazepam (RESTORIL) 30 MG capsule TAKE 2 CAPSULES BY MOUTH AT BEDTIME  180 capsule  1   No current facility-administered  medications for this visit.    REVIEW OF SYSTEMS:   Constitutional: Denies fevers, chills or abnormal night sweats Eyes: Denies blurriness of vision, double vision or watery eyes Ears, nose, mouth, throat, and face: Denies mucositis or sore throat Respiratory: Denies cough, dyspnea or wheezes Cardiovascular: Denies palpitation, chest discomfort or lower extremity swelling Skin: Denies abnormal skin rashes Lymphatics: Denies new lymphadenopathy  Neurological:Denies numbness, tingling or new weaknesses Behavioral/Psych: Mood is stable, no new changes  All other systems were reviewed with the patient and are negative.  PHYSICAL EXAMINATION: ECOG PERFORMANCE STATUS: 1 - Symptomatic but completely ambulatory  Filed Vitals:   05/02/13 1501  BP: 120/45  Pulse: 72  Temp: 97.4 F (36.3 C)  Resp: 18   Filed Weights   05/02/13 1501  Weight: 140 lb 3.2 oz (63.594 kg)    GENERAL:alert, no distress and comfortable. She appears well-nourished SKIN: Noticed some bruises but no petechiae rash  EYES: normal, conjunctiva are pink and non-injected, sclera clear OROPHARYNX:no exudate, no erythema and lips, buccal mucosa, and tongue normal  NECK: supple, thyroid normal size, non-tender, without nodularity LYMPH:  no palpable lymphadenopathy in the cervical, axillary or inguinal LUNGS: clear to auscultation and percussion with normal breathing effort HEART: regular rate & rhythm and no murmurs and no lower extremity edema ABDOMEN:abdomen soft, palpable hepatosplenomegaly Musculoskeletal:no cyanosis of digits and no clubbing  PSYCH: alert & oriented x 3 with fluent speech NEURO: no focal motor/sensory deficits  LABORATORY DATA:  I have reviewed the data as listed Lab Results  Component Value Date   WBC 6.0 02/10/2013   HGB 12.2 02/10/2013   HCT 36.0 02/10/2013   MCV 85.8 02/10/2013   PLT 98.0* 02/10/2013    Recent Labs  05/09/12 0859 05/16/12 1619 02/10/13 1519  NA 140 142 140  K 4.0  5.2* 3.8  CL 109 107 103  CO2 _0 GLUCOSE 88 85 81  BUN 25* 19 28*  CREATININE 1.5* 1.4* 1.2  CALCIUM 9.1 9.8 9.6  PROT 6.7  --  6.4  ALBUMIN 4.2  --  4.1  AST 22  --  21  ALT 17  --  16  ALKPHOS 85  --  94  BILITOT 0.7  --  0.7  BILIDIR 0.0  --   --     RADIOGRAPHIC STUDIES: Review her last imaging study dated 02/23/2013 which confirmed hepatosplenomegaly I have personally reviewed the radiological images as listed and agreed with the findings in the report.  ASSESSMENT & PLAN:  #1 hepatosplenomegaly #2 progressive thrombocytopenia #3 history of IgG lambda MGUS I expressed my concern that the abnormal hepatosplenomegaly might be an infiltrative process such as lymphoma or amyloidosis. The cause of the thrombocytopenia is likely related to the hepatosplenomegaly. Other causes of liver disease cannot be ruled out especially in view of potential undiagnosed autoimmune disease/connective  tissue diseases. A liver biopsy might be helpful however it is rather invasive with potential risk of complications such as uncontrolled bleeding. I recommend PET/CT scan for staging and bone marrow aspirate and biopsy to find the cause of her thrombocytopenia and to rule out monoclonal paraproteinemia in view of her prior history of IgG lumbar MGUS. The risks, benefits, side effects of bone marrow biopsy is discussed in detail with the patient and family member and she agreed to proceed. I will arrange for sedated bone marrow biopsy. I would bring up her case for discussion at the next hematology tumor Board and I would see her back after that for further review test results. Orders Placed This Encounter  Procedures  . NM PET Image Initial (PI) Skull Base To Thigh    Standing Status: Future     Number of Occurrences:      Standing Expiration Date: 07/02/2014    Order Specific Question:  Reason for Exam (SYMPTOM  OR DIAGNOSIS REQUIRED)    Answer:  staging lymphoma, splenomegaly    Order  Specific Question:  Preferred imaging location?    Answer:  [facility]    Standing Status: Future     Number of Occurrences:      Standing Expiration Date: 05/02/2014  . Comprehensive metabolic panel    Standing Status: Future     Number of Occurrences:      Standing Expiration Date: 05/02/2014  . CBC with Differential    Standing Status: Future     Number of Occurrences:      Standing Expiration Date: 05/02/2014  . Lactate dehydrogenase    Standing Status: Future     Number of Occurrences:      Standing Expiration Date: 05/02/2014  . Sedimentation rate    Standing Status: Future     Number of Occurrences:      Standing Expiration Date: 05/02/2014  . Ferritin    Standing Status: Future     Number of Occurrences:      Standing Expiration Date: 05/02/2014  . Iron and TIBC    Standing Status: Future     Number of Occurrences:      Standing Expiration Date: 05/02/2014  . SPEP & IFE with QIG    Standing Status: Future     Number of Occurrences:      Standing Expiration Date: 05/02/2014  . Kappa/lambda light chains    Standing Status: Future     Number of Occurrences:      Standing Expiration Date: 05/02/2014    All questions were answered. The patient knows to call the clinic with any problems, questions or concerns. I spent 40 minutes counseling the patient face to face. The total time spent in the appointment was 60 minutes and more than 50% was on counseling.     Heath Lark, MD 05/02/2013 8:08 PM

## 2013-05-02 NOTE — Progress Notes (Signed)
Checked in new patient with no financial issues. She has not been out of country,.

## 2013-05-03 ENCOUNTER — Telehealth: Payer: Self-pay | Admitting: *Deleted

## 2013-05-03 ENCOUNTER — Other Ambulatory Visit: Payer: Self-pay | Admitting: Hematology and Oncology

## 2013-05-03 ENCOUNTER — Encounter (HOSPITAL_COMMUNITY): Payer: Self-pay | Admitting: Pharmacy Technician

## 2013-05-03 DIAGNOSIS — D696 Thrombocytopenia, unspecified: Secondary | ICD-10-CM

## 2013-05-03 NOTE — Telephone Encounter (Signed)
Pt scheduled for BMBx in Short Stay on 4/17 at 8 am.  Notified Eulas Post in [employer].  Called pt and instructed on BMBx 4/17,  Arrive at 7 am Short Stay at [facility], [place] after midnight and need driver home. Pt verbalized understanding.

## 2013-05-05 ENCOUNTER — Encounter (HOSPITAL_COMMUNITY): Payer: Self-pay

## 2013-05-05 ENCOUNTER — Ambulatory Visit (HOSPITAL_COMMUNITY)
Admission: RE | Admit: 2013-05-05 | Discharge: 2013-05-05 | Disposition: A | Discharge status: Home / Self Care | Payer: 59 | Source: Ambulatory Visit | Attending: Hematology and Oncology | Admitting: Hematology and Oncology

## 2013-05-05 VITALS — BP 99/54 | HR 85 | Temp 97.7°F | Resp 16

## 2013-05-05 DIAGNOSIS — D472 Monoclonal gammopathy: Secondary | ICD-10-CM

## 2013-05-05 DIAGNOSIS — G4700 Insomnia, unspecified: Secondary | ICD-10-CM | POA: Insufficient documentation

## 2013-05-05 DIAGNOSIS — D696 Thrombocytopenia, unspecified: Secondary | ICD-10-CM | POA: Insufficient documentation

## 2013-05-05 DIAGNOSIS — K589 Irritable bowel syndrome without diarrhea: Secondary | ICD-10-CM | POA: Insufficient documentation

## 2013-05-05 DIAGNOSIS — Z880 Allergy status to penicillin: Secondary | ICD-10-CM | POA: Insufficient documentation

## 2013-05-05 DIAGNOSIS — C8589 Other specified types of non-Hodgkin lymphoma, extranodal and solid organ sites: Secondary | ICD-10-CM | POA: Insufficient documentation

## 2013-05-05 DIAGNOSIS — Z807 Family history of other malignant neoplasms of lymphoid, hematopoietic and related tissues: Secondary | ICD-10-CM | POA: Insufficient documentation

## 2013-05-05 DIAGNOSIS — K5900 Constipation, unspecified: Secondary | ICD-10-CM | POA: Insufficient documentation

## 2013-05-05 DIAGNOSIS — Z888 Allergy status to other drugs, medicaments and biological substances status: Secondary | ICD-10-CM | POA: Insufficient documentation

## 2013-05-05 DIAGNOSIS — Z800 Family history of malignant neoplasm of digestive organs: Secondary | ICD-10-CM | POA: Insufficient documentation

## 2013-05-05 DIAGNOSIS — K219 Gastro-esophageal reflux disease without esophagitis: Secondary | ICD-10-CM | POA: Insufficient documentation

## 2013-05-05 LAB — BONE MARROW EXAM: Bone Marrow Exam: 278

## 2013-05-05 LAB — COMPREHENSIVE METABOLIC PANEL
ALBUMIN: 4 g/dL (ref 3.5–5.2)
ALT: 17 U/L (ref 0–35)
AST: 23 U/L (ref 0–37)
Alkaline Phosphatase: 100 U/L (ref 39–117)
BUN: 19 mg/dL (ref 6–23)
CALCIUM: 9.6 mg/dL (ref 8.4–10.5)
CO2: 26 meq/L (ref 19–32)
CREATININE: 1.51 mg/dL — AB (ref 0.50–1.10)
Chloride: 101 mEq/L (ref 96–112)
GFR calc Af Amer: 41 mL/min — ABNORMAL LOW (ref >90)
GFR calc non Af Amer: 35 mL/min — ABNORMAL LOW (ref >90)
Glucose, Bld: 83 mg/dL (ref 70–99)
Potassium: 3.9 mEq/L (ref 3.7–5.3)
Sodium: 139 mEq/L (ref 137–147)
TOTAL PROTEIN: 6.4 g/dL (ref 6.0–8.3)
Total Bilirubin: 0.5 mg/dL (ref 0.3–1.2)

## 2013-05-05 LAB — IRON AND TIBC
IRON: 76 ug/dL (ref 42–135)
Saturation Ratios: 26 % (ref 20–55)
TIBC: 294 ug/dL (ref 250–470)
UIBC: 218 ug/dL (ref 125–400)

## 2013-05-05 LAB — CBC WITH DIFFERENTIAL/PLATELET
BASOS ABS: 0 10*3/uL (ref 0.0–0.1)
BASOS PCT: 1 % (ref 0–1)
EOS PCT: 0 % (ref 0–5)
Eosinophils Absolute: 0 10*3/uL (ref 0.0–0.7)
HCT: 31.1 % — ABNORMAL LOW (ref 36.0–46.0)
Hemoglobin: 10.8 g/dL — ABNORMAL LOW (ref 12.0–15.0)
Lymphocytes Relative: 51 % — ABNORMAL HIGH (ref 12–46)
Lymphs Abs: 2.5 10*3/uL (ref 0.7–4.0)
MCH: 29.6 pg (ref 26.0–34.0)
MCHC: 34.7 g/dL (ref 30.0–36.0)
MCV: 85.2 fL (ref 78.0–100.0)
MONO ABS: 0.3 10*3/uL (ref 0.1–1.0)
Monocytes Relative: 5 % (ref 3–12)
Neutro Abs: 2.2 10*3/uL (ref 1.7–7.7)
Neutrophils Relative %: 44 % (ref 43–77)
Platelets: 95 10*3/uL — ABNORMAL LOW (ref 150–400)
RBC: 3.65 MIL/uL — ABNORMAL LOW (ref 3.87–5.11)
RDW: 14.1 % (ref 11.5–15.5)
WBC: 5 10*3/uL (ref 4.0–10.5)

## 2013-05-05 LAB — LACTATE DEHYDROGENASE: LDH: 223 U/L (ref 94–250)

## 2013-05-05 LAB — SEDIMENTATION RATE: SED RATE: 12 mm/h (ref 0–22)

## 2013-05-05 LAB — FERRITIN: Ferritin: 94 ng/mL (ref 10–291)

## 2013-05-05 MED ORDER — MORPHINE SULFATE 10 MG/ML IJ SOLN
19.0000 mg | Freq: Once | INTRAMUSCULAR | Status: DC
  Filled 2013-05-05: qty 2

## 2013-05-05 MED ORDER — MIDAZOLAM HCL 5 MG/5ML IJ SOLN
INTRAMUSCULAR | Status: AC | PRN
  Administered 2013-05-05 (×3): 1 mg via INTRAVENOUS
  Administered 2013-05-05: 2 mg via INTRAVENOUS
  Administered 2013-05-05: 1 mg via INTRAVENOUS

## 2013-05-05 MED ORDER — MIDAZOLAM HCL 10 MG/2ML IJ SOLN
10.0000 mg | Freq: Once | INTRAMUSCULAR | Status: DC
  Filled 2013-05-05: qty 2

## 2013-05-05 MED ORDER — SODIUM CHLORIDE 0.9 % IV SOLN
Freq: Once | INTRAVENOUS | Status: AC
  Administered 2013-05-05: 08:00:00 via INTRAVENOUS

## 2013-05-05 MED ORDER — MORPHINE SULFATE 10 MG/ML IJ SOLN
INTRAMUSCULAR | Status: AC | PRN
  Administered 2013-05-05 (×5): 1 mg via INTRAVENOUS

## 2013-05-05 NOTE — Progress Notes (Signed)
Procedure was tolerated well by patient but patient never went to sleep nor did she complain during biopsy except about feeling pressure.  Her biopsy site was clean dry and intact upon DC

## 2013-05-05 NOTE — Procedures (Signed)
Brief examination was performed. ENT: adequate airway clearance Heart: regular rate and rhythm.No Murmurs Lungs: clear to auscultation, no wheezes, normal respiratory effort  American Society of Anesthesiologists ASA scale 1  Mallampati Score of 3  Bone Marrow Biopsy and Aspiration Procedure Note   Informed consent was obtained and potential risks including bleeding, infection and pain were reviewed with the patient. I verified that the patient has been fasting since midnight.  The patient's name, date of birth, identification, consent and allergies were verified prior to the start of procedure and time out was performed.  A total of 6 mg of IV Versed and 5 mg of IV morphine were given.  The right posterior iliac crest was chosen as the site of biopsy.  The skin was prepped with Betadine solution.   8 cc of 1% lidocaine was used to provide local anaesthesia.   10 cc of bone marrow aspirate was obtained followed by 1 inch biopsy.   The procedure was tolerated well and there were no complications.  The patient was stable at the end of the procedure.  Specimens sent for flow cytometry, cytogenetics and additional studies.  

## 2013-05-05 NOTE — Discharge Instructions (Signed)
Conscious Sedation, Adult, Care After °Refer to this sheet in the next few weeks. These instructions provide you with information on caring for yourself after your procedure. Your health care provider may also give you more specific instructions. Your treatment has been planned according to current medical practices, but problems sometimes occur. Call your health care provider if you have any problems or questions after your procedure. °WHAT TO EXPECT AFTER THE PROCEDURE  °After your procedure: °· You may feel sleepy, clumsy, and have poor balance for several hours. °· Vomiting may occur if you eat too soon after the procedure. °HOME CARE INSTRUCTIONS °· Do not participate in any activities where you could become injured for at least 24 hours. Do not: °· Drive. °· Swim. °· Ride a bicycle. °· Operate heavy machinery. °· Cook. °· Use power tools. °· Climb ladders. °· Work from a high place. °· Do not make important decisions or sign legal documents until you are improved. °· If you vomit, drink water, juice, or soup when you can drink without vomiting. Make sure you have little or no nausea before eating solid foods. °· Only take over-the-counter or prescription medicines for pain, discomfort, or fever as directed by your health care provider. °· Make sure you and your family fully understand everything about the medicines given to you, including what side effects may occur. °· You should not drink alcohol, take sleeping pills, or take medicines that cause drowsiness for at least 24 hours. °· If you smoke, do not smoke without supervision. °· If you are feeling better, you may resume normal activities 24 hours after you were sedated. °· Keep all appointments with your health care provider. °SEEK MEDICAL CARE IF: °· Your skin is pale or bluish in color. °· You continue to feel nauseous or vomit. °· Your pain is getting worse and is not helped by medicine. °· You have bleeding or swelling. °· You are still sleepy or  feeling clumsy after 24 hours. °SEEK IMMEDIATE MEDICAL CARE IF: °· You develop a rash. °· You have difficulty breathing. °· You develop any type of allergic problem. °· You have a fever. °MAKE SURE YOU: °· Understand these instructions. °· Will watch your condition. °· Will get help right away if you are not doing well or get worse. °Document Released: 10/26/2012 Document Reviewed: 08/12/2012 °ExitCare® Patient Information ©2014 [employer]. °Bone Marrow Aspiration, Bone Marrow Biopsy °Care After °Read the instructions outlined below and refer to this sheet in the next few weeks. These discharge instructions provide you with general information on caring for yourself after you leave the hospital. Your caregiver may also give you specific instructions. While your treatment has been planned according to the most current medical practices available, unavoidable complications occasionally occur. If you have any problems or questions after discharge, call your caregiver. °FINDING OUT THE RESULTS OF YOUR TEST °Not all test results are available during your visit. If your test results are not back during the visit, make an appointment with your caregiver to find out the results. Do not assume everything is normal if you have not heard from your caregiver or the medical facility. It is important for you to follow up on all of your test results.  °HOME CARE INSTRUCTIONS  °You have had sedation and may be sleepy or dizzy. Your thinking may not be as clear as usual. For the next 24 hours: °· Only take over-the-counter or prescription medicines for pain, discomfort, and or fever as directed by your caregiver. °·   Do not drink alcohol.  Do not smoke.  Do not drive.  Do not make important legal decisions.  Do not operate heavy machinery.  Do not care for small children by yourself.  Keep your dressing clean and dry. You may replace dressing with a bandage after 24 hours.  You may take a bath or shower after 24  hours.  Use an ice pack for 20 minutes every 2 hours while awake for pain as needed. SEEK MEDICAL CARE IF:   There is redness, swelling, or increasing pain at the biopsy site.  There is pus coming from the biopsy site.  There is drainage from a biopsy site lasting longer than one day.  An unexplained oral temperature above 102 F (38.9 C) develops. SEEK IMMEDIATE MEDICAL CARE IF:   You develop a rash.  You have difficulty breathing.  You develop any reaction or side effects to medications given. Document Released: 07/25/2004 Document Revised: 03/30/2011 Document Reviewed: 01/03/2008 [facility] Patient Information 2014 [employer].

## 2013-05-08 ENCOUNTER — Encounter (HOSPITAL_COMMUNITY): Payer: Self-pay

## 2013-05-08 ENCOUNTER — Encounter (HOSPITAL_COMMUNITY)
Admission: RE | Admit: 2013-05-08 | Discharge: 2013-05-08 | Disposition: A | Discharge status: Home / Self Care | Payer: 59 | Source: Ambulatory Visit | Attending: Hematology and Oncology | Admitting: Hematology and Oncology

## 2013-05-08 DIAGNOSIS — D696 Thrombocytopenia, unspecified: Secondary | ICD-10-CM | POA: Insufficient documentation

## 2013-05-08 DIAGNOSIS — R161 Splenomegaly, not elsewhere classified: Secondary | ICD-10-CM | POA: Insufficient documentation

## 2013-05-08 DIAGNOSIS — R634 Abnormal weight loss: Secondary | ICD-10-CM | POA: Insufficient documentation

## 2013-05-08 DIAGNOSIS — D509 Iron deficiency anemia, unspecified: Secondary | ICD-10-CM | POA: Insufficient documentation

## 2013-05-08 LAB — KAPPA/LAMBDA LIGHT CHAINS
Kappa free light chain: 4.6 mg/dL — ABNORMAL HIGH (ref 0.33–1.94)
Kappa, lambda light chain ratio: 2.77 — ABNORMAL HIGH (ref 0.26–1.65)
Lambda free light chains: 1.66 mg/dL (ref 0.57–2.63)

## 2013-05-08 LAB — GLUCOSE, CAPILLARY: GLUCOSE-CAPILLARY: 78 mg/dL (ref 70–99)

## 2013-05-08 MED ORDER — FLUDEOXYGLUCOSE F - 18 (FDG) INJECTION
7.5000 | Freq: Once | INTRAVENOUS | Status: AC | PRN
  Administered 2013-05-08: 7.5 via INTRAVENOUS

## 2013-05-09 LAB — PROTEIN ELECTROPHORESIS, SERUM
Albumin ELP: 64.9 % (ref 55.8–66.1)
Alpha-1-Globulin: 5.8 % — ABNORMAL HIGH (ref 2.9–4.9)
Alpha-2-Globulin: 9.6 % (ref 7.1–11.8)
BETA 2: 3.1 % — AB (ref 3.2–6.5)
BETA GLOBULIN: 6.2 % (ref 4.7–7.2)
Gamma Globulin: 10.4 % — ABNORMAL LOW (ref 11.1–18.8)
M-SPIKE, %: NOT DETECTED g/dL
TOTAL PROTEIN ELP: 6.1 g/dL (ref 6.0–8.3)

## 2013-05-11 ENCOUNTER — Encounter: Payer: Self-pay | Admitting: Hematology and Oncology

## 2013-05-11 ENCOUNTER — Ambulatory Visit (HOSPITAL_BASED_OUTPATIENT_CLINIC_OR_DEPARTMENT_OTHER): Payer: 59 | Admitting: Hematology and Oncology

## 2013-05-11 VITALS — BP 126/61 | HR 92 | Temp 97.8°F | Resp 18 | Ht 67.0 in | Wt 141.0 lb

## 2013-05-11 DIAGNOSIS — D696 Thrombocytopenia, unspecified: Secondary | ICD-10-CM

## 2013-05-11 DIAGNOSIS — Z23 Encounter for immunization: Secondary | ICD-10-CM

## 2013-05-11 DIAGNOSIS — C8307 Small cell B-cell lymphoma, spleen: Secondary | ICD-10-CM

## 2013-05-11 DIAGNOSIS — R161 Splenomegaly, not elsewhere classified: Secondary | ICD-10-CM

## 2013-05-11 MED ORDER — MENINGOCOCCAL VAC A,C,Y,W-135 ~~LOC~~ INJ
0.5000 mL | INJECTION | Freq: Once | SUBCUTANEOUS | Status: AC
  Administered 2013-05-11: 0.5 mL via SUBCUTANEOUS
  Filled 2013-05-11: qty 0.5

## 2013-05-11 MED ORDER — HAEMOPHILUS B POLYSAC CONJ VAC IM SOLR
0.5000 mL | Freq: Once | INTRAMUSCULAR | Status: AC
  Administered 2013-05-11: 0.5 mL via INTRAMUSCULAR
  Filled 2013-05-11: qty 0.5

## 2013-05-11 MED ORDER — PNEUMOCOCCAL VAC POLYVALENT 25 MCG/0.5ML IJ INJ
0.5000 mL | INJECTION | Freq: Once | INTRAMUSCULAR | Status: AC
  Administered 2013-05-11: 0.5 mL via INTRAMUSCULAR
  Filled 2013-05-11: qty 0.5

## 2013-05-11 NOTE — Progress Notes (Signed)
Put fmla form on nurse's desk °

## 2013-05-11 NOTE — Progress Notes (Signed)
[place] OFFICE PROGRESS NOTE  Patient Care Team: Ricard Dillon, MD as PCP - General  DIAGNOSIS: Newly diagnosed splenic marginal zone lymphoma with splenomegaly  SUMMARY OF ONCOLOGIC HISTORY: She was referred to see me in recently in April 2015 for abnormal blood work. On review of her blood work dated back to 2010, she had chronic thrombocytopenia with platelet count usually above 100,000. On 02/10/2013, her platelet count now has started to drop to 98,000. She was seen by another hematologist around 11/06/2009 and had numerous tests done to rule out connective tissue disease and cryoglobulinemia. At that time review, she has IgG lambda of unknown significance.  This patient had an interesting history with polyarthralgias and a weird constellation of symptoms consists of esophageal stricture with progressive dysphagia (she had over 10 esophageal dilatations performed for this), Raynaud's phenomenon, irritable bowel syndrome, chronic interstitial cystitis as well as easy bruising. Due to recurrent abdominal pain she had multiple imaging studies performed for evaluation. The most recent CT angiogram of the abdomen and pelvis dated 02/23/2013 show progressive hepatosplenomegaly. In terms of bruising tendency, she had multiple skin bruises usually in her arms. She also had passage of bright red blood per rectum in her stools several times a week. Her last colonoscopy revealed internal hemorrhoids.  She underwent PET/CT scan which came back normal. Bone marrow aspirate and biopsy confirmed low-grade lymphoma, suspect marginal zone lymphoma. Overall presentation is highly suspicious for splenic marginal zone lymphoma.  INTERVAL HISTORY: Shannon Barajas 63 y.o. female returns for further followup. Her symptoms are about the same.  I have reviewed the past medical history, past surgical history, social history and family history with the patient and they are unchanged from  previous note.  ALLERGIES:  is allergic to biaxin; erythromycin; soy allergy; and zithromax.  MEDICATIONS:  Current Outpatient Prescriptions  Medication Sig Dispense Refill  . calcium carbonate (TUMS - DOSED IN MG ELEMENTAL CALCIUM) 500 MG chewable tablet Chew 1 tablet by mouth 4 (four) times daily as needed for indigestion or heartburn.      . estradiol (ESTRACE) 2 MG tablet Take 2 mg by mouth every evening.       . Fe Fum-FePoly-Vit C-Vit B3 (INTEGRA) 62.5-62.5-40-3 MG CAPS Take 1 capsule by mouth daily.  90 capsule  3  . Linaclotide (LINZESS) 290 MCG CAPS capsule Take 290 mcg by mouth every morning.      . magnesium 30 MG tablet Take 30 mg by mouth daily.      . metoCLOPramide (REGLAN) 5 MG tablet Take 1 tablet (5 mg total) by mouth 2 (two) times daily at 10 am and 4 pm.  180 tablet  3  . temazepam (RESTORIL) 30 MG capsule Take 60 mg by mouth at bedtime as needed for sleep.      . celecoxib (CELEBREX) 400 MG capsule Take 400 mg by mouth daily after breakfast.       No current facility-administered medications for this visit.    REVIEW OF SYSTEMS:    All other systems were reviewed with the patient and are negative.  PHYSICAL EXAMINATION: ECOG PERFORMANCE STATUS: 1 - Symptomatic but completely ambulatory  Filed Vitals:   05/11/13 1018  BP: 126/61  Pulse: 92  Temp: 97.8 F (36.6 C)  Resp: 18   Filed Weights   05/11/13 1018  Weight: 141 lb (63.957 kg)    GENERAL:alert, no distress and comfortable Musculoskeletal:no cyanosis of digits and no clubbing  NEURO: alert & oriented x  3 with fluent speech, no focal motor/sensory deficits  LABORATORY DATA:  I have reviewed the data as listed    Component Value Date/Time   NA 139 05/05/2013 0730   K 3.9 05/05/2013 0730   CL 101 05/05/2013 0730   CO2 26 05/05/2013 0730   GLUCOSE 83 05/05/2013 0730   BUN 19 05/05/2013 0730   CREATININE 1.51* 05/05/2013 0730   CALCIUM 9.6 05/05/2013 0730   PROT 6.4 05/05/2013 0730   ALBUMIN 4.0  05/05/2013 0730   AST 23 05/05/2013 0730   ALT 17 05/05/2013 0730   ALKPHOS 100 05/05/2013 0730   BILITOT 0.5 05/05/2013 0730   GFRNONAA 35* 05/05/2013 0730   GFRAA 41* 05/05/2013 0730    No results found for this basename: SPEP,  UPEP,   kappa and lambda light chains    Lab Results  Component Value Date   WBC 5.0 05/05/2013   NEUTROABS 2.2 05/05/2013   HGB 10.8* 05/05/2013   HCT 31.1* 05/05/2013   MCV 85.2 05/05/2013   PLT 95* 05/05/2013      Chemistry      Component Value Date/Time   NA 139 05/05/2013 0730   K 3.9 05/05/2013 0730   CL 101 05/05/2013 0730   CO2 26 05/05/2013 0730   BUN 19 05/05/2013 0730   CREATININE 1.51* 05/05/2013 0730      Component Value Date/Time   CALCIUM 9.6 05/05/2013 0730   ALKPHOS 100 05/05/2013 0730   AST 23 05/05/2013 0730   ALT 17 05/05/2013 0730   BILITOT 0.5 05/05/2013 0730       RADIOGRAPHIC STUDIES: I reviewed the PET/CT scan with her husband. I have personally reviewed the radiological images as listed and agreed with the findings in the report.   ASSESSMENT & PLAN:  #1 splenic marginal zone lymphoma with splenomegaly and thrombocytopenia I discussed with the patient the approach to her disease. She has a lot of symptoms of early satiety which I suspect is related to her massive splenomegaly. I recommend consideration for splenectomy. Alternative treatment would require systemic therapy such as rituximab alone with or without chemotherapy. I recommend the patient to obtain second opinion. Today, I will proceed with vaccination with triple vaccine in anticipation for possible splenectomy.  All questions were answered. The patient knows to call the clinic with any problems, questions or concerns. No barriers to learning was detected. I spent 40 minutes counseling the patient face to face. The total time spent in the appointment was 55 minutes and more than 50% was on counseling and review of test results     Heath Lark, MD 05/11/2013 10:15  PM

## 2013-05-12 ENCOUNTER — Other Ambulatory Visit: Payer: Self-pay | Admitting: Hematology and Oncology

## 2013-05-12 ENCOUNTER — Encounter: Payer: Self-pay | Admitting: *Deleted

## 2013-05-12 ENCOUNTER — Telehealth: Payer: Self-pay | Admitting: *Deleted

## 2013-05-12 NOTE — Progress Notes (Signed)
Completed FMLA form back to [employer]. Melanee Spry to fax records to [facility]

## 2013-05-12 NOTE — Progress Notes (Signed)
The patient is referred to [facility] for second opinion. This is a rare disease and the burden of disease is large. I felt more comfortable for her to be evaluated in a tertiary center because I think she will end up needing a complex procedure that we cannot perform here at [facility].

## 2013-05-12 NOTE — Telephone Encounter (Signed)
Notified patient of appointments at [facility].   May 6th @ 2[facility]15pm [facility] [phone]. We will fax records.  May 6th @ 3:00pm lab, 4:00pm (oncologist) Dr Gerald Dexter 5th floor We will fax records including copy of insurance to [phone] attn [employer] . We will mail disc of all scans to:  [facility], [address] [place], [place] [address]

## 2013-05-15 ENCOUNTER — Telehealth: Payer: Self-pay | Admitting: Hematology and Oncology

## 2013-05-15 ENCOUNTER — Telehealth: Payer: Self-pay | Admitting: *Deleted

## 2013-05-15 ENCOUNTER — Encounter: Payer: Self-pay | Admitting: Hematology and Oncology

## 2013-05-15 NOTE — Telephone Encounter (Signed)
Pt states she looked up the name of Surgeon she is scheduled to see at [facility] and would rather see another Surgeon.  She said he did not have a good report on [facility] online.  Instructed pt to call [facility] and confirm this is the same MD and if so she can request to see another surgeon.  She verbalized understanding.

## 2013-05-15 NOTE — Progress Notes (Signed)
Faxed fmla form to Shannon Barajas @ [phone]; put original in registration desk. °

## 2013-05-15 NOTE — Telephone Encounter (Signed)
Faxed pt medical records to Dr's [facility] and [place] @ [place]. Scans and slides will be fedex'ed

## 2013-05-17 LAB — CHROMOSOME ANALYSIS, BONE MARROW

## 2013-05-26 ENCOUNTER — Encounter: Payer: Self-pay | Admitting: *Deleted

## 2013-05-26 ENCOUNTER — Telehealth: Payer: Self-pay | Admitting: *Deleted

## 2013-05-26 NOTE — Telephone Encounter (Signed)
Pt states she saw Lymphoma Specialist Dr. Gerald Dexter at [facility] on 5/06.  She says he recommended she not have surgery but try Rituxan weekly x 4 weeks.  She would like to be scheduled to start Rituxan next week.  Informed pt I will relay message to Dr. Alvy Bimler on Monday as she is not in office today.  We need to schedule pt to be seen in office and have chemo education class prior to starting treatment if Dr. Alvy Bimler agrees.  Will call pt back Monday with Dr. Calton Dach reply.  Pt verbalized understanding.

## 2013-05-26 NOTE — Progress Notes (Signed)
[place] Psychosocial Distress Screening Clinical Social Work  Clinical Social Work was referred by distress screening protocol.  The patient scored a 8 on the Psychosocial Distress Thermometer which indicates severe distress. Clinical Social Worker contacted patient by phone to assess for distress and other psychosocial needs. Mrs. Doffing reports feeling very overwhelmed- she states she has never dealt with an illness before and is feeling discouraged from visit at [facility].  The patient states she is awaiting follow up from Dr. Alvy Bimler regarding when she can begin chemotherapy.  The patient identifies a strong support system including her spouse (of 46 years), mother, two daughters (one lives in [place]), and grandchildren.  The patient indicates worries regarding how they are coping as well.   CSW validated patient's emotions and provided brief emotional counseling.   ONCBCN DISTRESS SCREENING 05/12/2013  Screening Type Initial Screening  Elta Guadeloupe the number that describes how much distress you have been experiencing in the past week 8  Practical problem type Work/school  Emotional problem type Nervousness/Anxiety;Adjusting to illness;Adjusting to appearance changes  Spiritual/Religous concerns type Facing my mortality  Information Concerns Type Lack of info about maintaining fitness  Physical Problem type Pain;Sleep/insomnia;Constipation/diarrhea  Physician notified of physical symptoms Yes    Clinical Social Worker follow up needed: yes  If yes, follow up plan:  CSW will follow up with patient at upcoming appointments.   Polo Riley, MSW, LCSW, OSW-C Clinical Social Worker [facility] [phone]

## 2013-05-28 ENCOUNTER — Other Ambulatory Visit: Payer: Self-pay | Admitting: Hematology and Oncology

## 2013-05-28 DIAGNOSIS — C8589 Other specified types of non-Hodgkin lymphoma, extranodal and solid organ sites: Secondary | ICD-10-CM

## 2013-05-28 NOTE — Telephone Encounter (Signed)
Please tell her I will see her Tuesday morning as an add on Arrive as early as possible and I can meet her at 8 am [place]

## 2013-05-29 ENCOUNTER — Telehealth: Payer: Self-pay | Admitting: Internal Medicine

## 2013-05-29 ENCOUNTER — Telehealth: Payer: Self-pay | Admitting: *Deleted

## 2013-05-29 ENCOUNTER — Telehealth: Payer: Self-pay | Admitting: Hematology and Oncology

## 2013-05-29 MED ORDER — DIAZEPAM 5 MG PO TABS: 5.0000 mg | ORAL_TABLET | Freq: Every day | ORAL | Status: DC

## 2013-05-29 NOTE — Telephone Encounter (Signed)
Informed pt of appt being made to see Dr. Alvy Bimler tomorrow morning at 8 am to discuss starting tx w/ Rituxan this week.   Pt verbalized understanding.  States will be here in the morning at 8 am.

## 2013-05-29 NOTE — Telephone Encounter (Signed)
Pt states she is unable to sleep. She has been taking 2 temazepam (RESTORIL) 30 MG capsule every evening and it's not working.  She was recently diagnosed with Lymphoma and is not sure if that's the reason she hasn't slept.

## 2013-05-29 NOTE — Telephone Encounter (Signed)
Change to diazepam 5 mg per Dr Arnoldo Morale.  No refills Dr Arnoldo Morale wants to use it as a trial basis

## 2013-05-29 NOTE — Telephone Encounter (Signed)
Pt was told d/c the temazepam, rx called in to [facility]

## 2013-05-29 NOTE — Telephone Encounter (Signed)
, °

## 2013-05-29 NOTE — Telephone Encounter (Signed)
Per staff message and POF I have scheduled appts.  JMW  

## 2013-05-30 ENCOUNTER — Ambulatory Visit (HOSPITAL_BASED_OUTPATIENT_CLINIC_OR_DEPARTMENT_OTHER): Payer: 59

## 2013-05-30 ENCOUNTER — Encounter: Payer: Self-pay | Admitting: *Deleted

## 2013-05-30 ENCOUNTER — Ambulatory Visit (HOSPITAL_BASED_OUTPATIENT_CLINIC_OR_DEPARTMENT_OTHER): Payer: 59 | Admitting: Hematology and Oncology

## 2013-05-30 ENCOUNTER — Encounter: Payer: Self-pay | Admitting: Hematology and Oncology

## 2013-05-30 ENCOUNTER — Other Ambulatory Visit: Payer: Self-pay | Admitting: Hematology and Oncology

## 2013-05-30 ENCOUNTER — Other Ambulatory Visit: Payer: 59

## 2013-05-30 ENCOUNTER — Telehealth: Payer: Self-pay | Admitting: Hematology and Oncology

## 2013-05-30 VITALS — BP 127/61 | HR 76 | Temp 97.5°F | Resp 18 | Ht 67.0 in | Wt 140.4 lb

## 2013-05-30 DIAGNOSIS — F411 Generalized anxiety disorder: Secondary | ICD-10-CM

## 2013-05-30 DIAGNOSIS — R161 Splenomegaly, not elsewhere classified: Secondary | ICD-10-CM

## 2013-05-30 DIAGNOSIS — C8590 Non-Hodgkin lymphoma, unspecified, unspecified site: Secondary | ICD-10-CM

## 2013-05-30 DIAGNOSIS — C8307 Small cell B-cell lymphoma, spleen: Secondary | ICD-10-CM

## 2013-05-30 DIAGNOSIS — G4700 Insomnia, unspecified: Secondary | ICD-10-CM

## 2013-05-30 DIAGNOSIS — C8589 Other specified types of non-Hodgkin lymphoma, extranodal and solid organ sites: Secondary | ICD-10-CM

## 2013-05-30 DIAGNOSIS — D696 Thrombocytopenia, unspecified: Secondary | ICD-10-CM

## 2013-05-30 LAB — COMPREHENSIVE METABOLIC PANEL (CC13)
ALBUMIN: 4.2 g/dL (ref 3.5–5.0)
ALT: 12 U/L (ref 0–55)
AST: 17 U/L (ref 5–34)
Alkaline Phosphatase: 119 U/L (ref 40–150)
Anion Gap: 11 mEq/L (ref 3–11)
BUN: 24.9 mg/dL (ref 7.0–26.0)
CHLORIDE: 106 meq/L (ref 98–109)
CO2: 23 mEq/L (ref 22–29)
Calcium: 10.1 mg/dL (ref 8.4–10.4)
Creatinine: 1.5 mg/dL — ABNORMAL HIGH (ref 0.6–1.1)
Glucose: 101 mg/dl (ref 70–140)
Potassium: 4 mEq/L (ref 3.5–5.1)
Sodium: 141 mEq/L (ref 136–145)
Total Bilirubin: 0.39 mg/dL (ref 0.20–1.20)
Total Protein: 6.5 g/dL (ref 6.4–8.3)

## 2013-05-30 LAB — CBC WITH DIFFERENTIAL/PLATELET
BASO%: 0.5 % (ref 0.0–2.0)
Basophils Absolute: 0 10*3/uL (ref 0.0–0.1)
EOS%: 0.9 % (ref 0.0–7.0)
Eosinophils Absolute: 0.1 10*3/uL (ref 0.0–0.5)
HCT: 34.8 % (ref 34.8–46.6)
HGB: 11.6 g/dL (ref 11.6–15.9)
LYMPH#: 2.3 10*3/uL (ref 0.9–3.3)
LYMPH%: 34.1 % (ref 14.0–49.7)
MCH: 29.3 pg (ref 25.1–34.0)
MCHC: 33.2 g/dL (ref 31.5–36.0)
MCV: 88.2 fL (ref 79.5–101.0)
MONO#: 0.3 10*3/uL (ref 0.1–0.9)
MONO%: 4.9 % (ref 0.0–14.0)
NEUT#: 4 10*3/uL (ref 1.5–6.5)
NEUT%: 59.6 % (ref 38.4–76.8)
Platelets: 107 10*3/uL — ABNORMAL LOW (ref 145–400)
RBC: 3.94 10*6/uL (ref 3.70–5.45)
RDW: 14 % (ref 11.2–14.5)
WBC: 6.7 10*3/uL (ref 3.9–10.3)

## 2013-05-30 LAB — HEPATITIS B SURFACE ANTIBODY,QUALITATIVE: Hep B S Ab: NEGATIVE

## 2013-05-30 LAB — URIC ACID (CC13): Uric Acid, Serum: 6.4 mg/dl (ref 2.6–7.4)

## 2013-05-30 LAB — HEPATITIS B CORE ANTIBODY, IGM: Hep B C IgM: NONREACTIVE

## 2013-05-30 LAB — LACTATE DEHYDROGENASE (CC13): LDH: 225 U/L (ref 125–245)

## 2013-05-30 LAB — HEPATITIS B SURFACE ANTIGEN: HEP B S AG: NEGATIVE

## 2013-05-30 MED ORDER — PREDNISONE 20 MG PO TABS: 60.0000 mg | ORAL_TABLET | Freq: Every day | ORAL | Status: DC

## 2013-05-30 MED ORDER — TRAZODONE HCL 100 MG PO TABS: 100.0000 mg | ORAL_TABLET | Freq: Every day | ORAL | Status: DC

## 2013-05-30 MED ORDER — ALLOPURINOL 300 MG PO TABS: 300.0000 mg | ORAL_TABLET | Freq: Every day | ORAL | Status: DC

## 2013-05-30 NOTE — Telephone Encounter (Signed)
gv adn printed appt sched and avs for pt for May and June °

## 2013-05-30 NOTE — Progress Notes (Signed)
[place] OFFICE PROGRESS NOTE  Patient Care Team: Ricard Dillon, MD as PCP - General  DIAGNOSIS: Marginal zone lymphoma, stage IV  SUMMARY OF ONCOLOGIC HISTORY: Oncology History   Non-Hodgkin lymphoma, marginal zone lymphoma   Primary site: Lymphoid Neoplasms   Staging method: AJCC 6th Edition   Clinical: Stage IV signed by Barajas Lark, MD on 05/30/2013  8:47 AM   Summary: Stage IV       Non-Hodgkin lymphoma   02/23/2013 Imaging CT scan show significant enlargement of liver and spleen   05/05/2013 Bone Marrow Biopsy Bone marrow aspirate and biopsy confirmed low grade lymphoma, suspect marginal zone lymphoma   05/08/2013 Imaging PET scan show involvement of liver and spleen    INTERVAL HISTORY: Shannon Barajas 63 y.o. female returns for further followup. She went to [facility] for second opinion and was recommended rituximab consider splenectomy. Both the patient and her husband struggle to understand and do differences in opinion and the treatment recommendations. In the meantime, she complained of profound fatigue and had insomnia. She denies depression.  I have reviewed the past medical history, past surgical history, social history and family history with the patient and they are unchanged from previous note.  ALLERGIES:  is allergic to biaxin; erythromycin; soy allergy; and zithromax.  MEDICATIONS:  Current Outpatient Prescriptions  Medication Sig Dispense Refill  . allopurinol (ZYLOPRIM) 300 MG tablet Take 1 tablet (300 mg total) by mouth daily.  30 tablet  0  . calcium carbonate (TUMS - DOSED IN MG ELEMENTAL CALCIUM) 500 MG chewable tablet Chew 1 tablet by mouth 4 (four) times daily as needed for indigestion or heartburn.      . celecoxib (CELEBREX) 400 MG capsule Take 400 mg by mouth daily after breakfast.      . diazepam (VALIUM) 5 MG tablet Take 1 tablet (5 mg total) by mouth at bedtime.  30 tablet  0  . estradiol (ESTRACE) 2 MG tablet Take 2 mg by mouth  every evening.       . Fe Fum-FePoly-Vit C-Vit B3 (INTEGRA) 62.5-62.5-40-3 MG CAPS Take 1 capsule by mouth daily.  90 capsule  3  . Linaclotide (LINZESS) 290 MCG CAPS capsule Take 290 mcg by mouth every morning.      . magnesium 30 MG tablet Take 30 mg by mouth daily.      . metoCLOPramide (REGLAN) 5 MG tablet Take 1 tablet (5 mg total) by mouth 2 (two) times daily at 10 am and 4 pm.  180 tablet  3  . predniSONE (DELTASONE) 20 MG tablet Take 3 tablets (60 mg total) by mouth daily with breakfast.  20 tablet  0  . traZODone (DESYREL) 100 MG tablet Take 1 tablet (100 mg total) by mouth at bedtime.  30 tablet  3   No current facility-administered medications for this visit.    REVIEW OF SYSTEMS:   Constitutional: Denies fevers, chills or abnormal weight loss Behavioral/Psych: Mood is stable, no new changes  All other systems were reviewed with the patient and are negative.  PHYSICAL EXAMINATION: ECOG PERFORMANCE STATUS: 1 - Symptomatic but completely ambulatory  Filed Vitals:   05/30/13 0759  BP: 127/61  Pulse: 76  Temp: 97.5 F (36.4 C)  Resp: 18   Filed Weights   05/30/13 0759  Weight: 140 lb 6.4 oz (63.685 kg)    GENERAL:alert, no distress and comfortable SKIN: skin color, texture, turgor are normal, no rashes or significant lesions Musculoskeletal:no cyanosis of digits and  no clubbing  NEURO: alert & oriented x 3 with fluent speech, no focal motor/sensory deficits  LABORATORY DATA:  I have reviewed the data as listed    Component Value Date/Time   NA 139 05/05/2013 0730   K 3.9 05/05/2013 0730   CL 101 05/05/2013 0730   CO2 26 05/05/2013 0730   GLUCOSE 83 05/05/2013 0730   BUN 19 05/05/2013 0730   CREATININE 1.51* 05/05/2013 0730   CALCIUM 9.6 05/05/2013 0730   PROT 6.4 05/05/2013 0730   ALBUMIN 4.0 05/05/2013 0730   AST 23 05/05/2013 0730   ALT 17 05/05/2013 0730   ALKPHOS 100 05/05/2013 0730   BILITOT 0.5 05/05/2013 0730   GFRNONAA 35* 05/05/2013 0730   GFRAA 41* 05/05/2013  0730    No results found for this basename: SPEP,  UPEP,   kappa and lambda light chains    Lab Results  Component Value Date   WBC 5.0 05/05/2013   NEUTROABS 2.2 05/05/2013   HGB 10.8* 05/05/2013   HCT 31.1* 05/05/2013   MCV 85.2 05/05/2013   PLT 95* 05/05/2013      Chemistry      Component Value Date/Time   NA 139 05/05/2013 0730   K 3.9 05/05/2013 0730   CL 101 05/05/2013 0730   CO2 26 05/05/2013 0730   BUN 19 05/05/2013 0730   CREATININE 1.51* 05/05/2013 0730      Component Value Date/Time   CALCIUM 9.6 05/05/2013 0730   ALKPHOS 100 05/05/2013 0730   AST 23 05/05/2013 0730   ALT 17 05/05/2013 0730   BILITOT 0.5 05/05/2013 0730     ASSESSMENT & PLAN:  #1 splenic marginal zone lymphoma with splenomegaly and thrombocytopenia We have a very long discussion today. I discussed with the patient and the husband the pros and cons of splenectomy versus rituximab treatment. We also discussed about potential tertiary referral. Ultimately, the patient is comfortable to try weekly rituximab. Plan for weekly rituximab X 4 and reassess with a PET scan in 3 months. If she has good response to treatment, we can discuss potential maintain his therapy. We discussed the role of chemotherapy. The intent is for cure.  We discussed some of the risks, benefits, side-effects of Rituximab.  Some of the short term side-effects included, though not limited to, risk of fatigue, pancytopenia, allergic reactions, admission to hospital for various reasons, and risks of death.   The patient is aware that the response rates discussed earlier is not guaranteed.    After a long discussion, patient made an informed decision to proceed with the prescribed plan of care and went ahead to sign the consent form today.   Patient education material was dispensed.  I plan to see her next week. #2 anxiety and insomnia I suspect she have a great element of anxiety. I recommend a trial of trazodone #3 tumor lysis  prophylaxis I plan to start her on prednisone 60 mg x3 days and allopurinol.  All questions were answered. The patient knows to call the clinic with any problems, questions or concerns. No barriers to learning was detected. I spent 40 minutes counseling the patient face to face. The total time spent in the appointment was 60 minutes and more than 50% was on counseling and review of test results     Barajas Lark, MD 05/30/2013 9:28 AM

## 2013-05-30 NOTE — Patient Instructions (Signed)

## 2013-05-31 ENCOUNTER — Ambulatory Visit: Payer: 59

## 2013-05-31 ENCOUNTER — Telehealth: Payer: Self-pay | Admitting: Internal Medicine

## 2013-05-31 NOTE — Telephone Encounter (Signed)
Medication list updated.

## 2013-05-31 NOTE — Telephone Encounter (Signed)
Pt stated diazepam is not working and she  Will just continue with temazepam. Pt would like md to take diazepam off her med list. Pt saw oncologist yesterday and was prescribed another sleep med.

## 2013-06-02 ENCOUNTER — Ambulatory Visit: Payer: 59

## 2013-06-02 ENCOUNTER — Other Ambulatory Visit: Payer: Self-pay | Admitting: Hematology and Oncology

## 2013-06-02 ENCOUNTER — Ambulatory Visit (HOSPITAL_BASED_OUTPATIENT_CLINIC_OR_DEPARTMENT_OTHER): Payer: 59

## 2013-06-02 ENCOUNTER — Telehealth: Payer: Self-pay | Admitting: *Deleted

## 2013-06-02 ENCOUNTER — Other Ambulatory Visit: Payer: 59

## 2013-06-02 ENCOUNTER — Other Ambulatory Visit: Payer: Self-pay | Admitting: *Deleted

## 2013-06-02 VITALS — BP 93/56 | HR 80 | Temp 97.1°F | Resp 18

## 2013-06-02 DIAGNOSIS — Z5112 Encounter for antineoplastic immunotherapy: Secondary | ICD-10-CM

## 2013-06-02 DIAGNOSIS — C8590 Non-Hodgkin lymphoma, unspecified, unspecified site: Secondary | ICD-10-CM

## 2013-06-02 DIAGNOSIS — C8307 Small cell B-cell lymphoma, spleen: Secondary | ICD-10-CM

## 2013-06-02 MED ORDER — SODIUM CHLORIDE 0.9 % IV SOLN
375.0000 mg/m2 | Freq: Once | INTRAVENOUS | Status: AC
  Administered 2013-06-02: 700 mg via INTRAVENOUS
  Filled 2013-06-02: qty 70

## 2013-06-02 MED ORDER — DEXAMETHASONE SODIUM PHOSPHATE 20 MG/5ML IJ SOLN
INTRAMUSCULAR | Status: AC
  Filled 2013-06-02: qty 5

## 2013-06-02 MED ORDER — FAMOTIDINE IN NACL 20-0.9 MG/50ML-% IV SOLN
20.0000 mg | Freq: Once | INTRAVENOUS | Status: AC
  Administered 2013-06-02: 20 mg via INTRAVENOUS

## 2013-06-02 MED ORDER — DEXAMETHASONE SODIUM PHOSPHATE 20 MG/5ML IJ SOLN
20.0000 mg | Freq: Once | INTRAMUSCULAR | Status: AC
  Administered 2013-06-02: 20 mg via INTRAVENOUS

## 2013-06-02 MED ORDER — ACETAMINOPHEN 325 MG PO TABS
650.0000 mg | ORAL_TABLET | Freq: Once | ORAL | Status: AC
  Administered 2013-06-02: 650 mg via ORAL

## 2013-06-02 MED ORDER — ACETAMINOPHEN 325 MG PO TABS
ORAL_TABLET | ORAL | Status: AC
  Filled 2013-06-02: qty 2

## 2013-06-02 MED ORDER — PROMETHAZINE HCL 25 MG PO TABS: 25.0000 mg | ORAL_TABLET | Freq: Four times a day (QID) | ORAL | Status: DC | PRN

## 2013-06-02 MED ORDER — SODIUM CHLORIDE 0.9 % IV SOLN
Freq: Once | INTRAVENOUS | Status: AC
  Administered 2013-06-02: 09:00:00 via INTRAVENOUS

## 2013-06-02 MED ORDER — DIPHENHYDRAMINE HCL 50 MG/ML IJ SOLN
25.0000 mg | Freq: Once | INTRAMUSCULAR | Status: AC
  Administered 2013-06-02: 25 mg via INTRAVENOUS

## 2013-06-02 MED ORDER — PROMETHAZINE HCL 25 MG/ML IJ SOLN
25.0000 mg | Freq: Once | INTRAMUSCULAR | Status: AC
  Administered 2013-06-02: 25 mg via INTRAVENOUS
  Filled 2013-06-02: qty 1

## 2013-06-02 MED ORDER — DIPHENHYDRAMINE HCL 25 MG PO CAPS
50.0000 mg | ORAL_CAPSULE | Freq: Once | ORAL | Status: AC
  Administered 2013-06-02: 50 mg via ORAL

## 2013-06-02 MED ORDER — DIPHENHYDRAMINE HCL 25 MG PO CAPS
ORAL_CAPSULE | ORAL | Status: AC
  Filled 2013-06-02: qty 2

## 2013-06-02 NOTE — Progress Notes (Signed)
Patient completed Rituxan without any further problems.

## 2013-06-02 NOTE — Progress Notes (Signed)
1044 Patient c/o light headedness, denies any other symptoms. Rituxan stopped, saline infusing. Vs 87/46 81 100% on RA.  Spoke with Dr. Alvy Bimler, Benadryl 25 mg and Pepcid 20 mg IV given. 1100 BP 92/49 HR 82  Dr. Alvy Bimler here to see patient, patient c/o queasy stomach, Phenergan 25 mg IV given. 1115 BP 100/61 HR 78 Rituxan restarted at 50 mg/hr.

## 2013-06-02 NOTE — Patient Instructions (Signed)
[facility] Discharge Instructions for Patients Receiving Chemotherapy  Today you received the following chemotherapy agents Rituxan.  To help prevent nausea and vomiting after your treatment, we encourage you to take your nausea medication as prescribed.   If you develop nausea and vomiting that is not controlled by your nausea medication, call the clinic.   BELOW ARE SYMPTOMS THAT SHOULD BE REPORTED IMMEDIATELY:  *FEVER GREATER THAN 100.5 F  *CHILLS WITH OR WITHOUT FEVER  NAUSEA AND VOMITING THAT IS NOT CONTROLLED WITH YOUR NAUSEA MEDICATION  *UNUSUAL SHORTNESS OF BREATH  *UNUSUAL BRUISING OR BLEEDING  TENDERNESS IN MOUTH AND THROAT WITH OR WITHOUT PRESENCE OF ULCERS  *URINARY PROBLEMS  *BOWEL PROBLEMS  UNUSUAL RASH Items with * indicate a potential emergency and should be followed up as soon as possible.  Feel free to call the clinic you have any questions or concerns. The clinic phone number is [phone].    

## 2013-06-02 NOTE — Telephone Encounter (Signed)
Patient currently on metoclopramide 5 mg bid with new script for promethazine. Requesting clarification-which to stop. Per Dr. Alvy Bimler, she was not on the optimal dose of metoclopramide, so stop this medication and try the promethazine.

## 2013-06-05 ENCOUNTER — Telehealth: Payer: Self-pay | Admitting: *Deleted

## 2013-06-05 NOTE — Telephone Encounter (Signed)
Message copied by Cherylynn Ridges on Mon Jun 05, 2013  3:10 PM ------      Message from: Renford Dills      Created: Fri Jun 02, 2013  3:14 PM      Regarding: chemo follow-up call       1st Rituxan  Dr. Alvy Bimler  [phone] ------

## 2013-06-05 NOTE — Telephone Encounter (Signed)
Called Shannon Barajas for chemotherapy F/U.  Patient did not sleep well last night and is tired today.  Trazadone ordered and is helping per patient.  Otherwise she is doing well.  Denies n/v.  Denies any new side effects or symptoms.  Bowel and bladder is functioning well.  Eating and drinking well and I instructed to drink 64 oz minimum daily or at least the day before, of and after treatment.  Denies questions at this time and encouraged to call if needed.  Reviewed how to call after hours in the case of an emergency.

## 2013-06-07 ENCOUNTER — Ambulatory Visit: Payer: 59

## 2013-06-08 ENCOUNTER — Other Ambulatory Visit: Payer: Self-pay | Admitting: *Deleted

## 2013-06-09 ENCOUNTER — Ambulatory Visit: Payer: 59

## 2013-06-09 ENCOUNTER — Telehealth: Payer: Self-pay | Admitting: Hematology and Oncology

## 2013-06-09 ENCOUNTER — Ambulatory Visit (HOSPITAL_BASED_OUTPATIENT_CLINIC_OR_DEPARTMENT_OTHER): Payer: 59

## 2013-06-09 ENCOUNTER — Other Ambulatory Visit (HOSPITAL_BASED_OUTPATIENT_CLINIC_OR_DEPARTMENT_OTHER): Payer: 59

## 2013-06-09 ENCOUNTER — Ambulatory Visit (HOSPITAL_BASED_OUTPATIENT_CLINIC_OR_DEPARTMENT_OTHER): Payer: 59 | Admitting: Hematology and Oncology

## 2013-06-09 VITALS — BP 121/63 | HR 83 | Temp 97.3°F | Resp 18 | Ht 67.0 in | Wt 147.1 lb

## 2013-06-09 VITALS — BP 105/58 | HR 77 | Temp 97.6°F | Resp 18

## 2013-06-09 DIAGNOSIS — R161 Splenomegaly, not elsewhere classified: Secondary | ICD-10-CM

## 2013-06-09 DIAGNOSIS — D696 Thrombocytopenia, unspecified: Secondary | ICD-10-CM

## 2013-06-09 DIAGNOSIS — R944 Abnormal results of kidney function studies: Secondary | ICD-10-CM

## 2013-06-09 DIAGNOSIS — K589 Irritable bowel syndrome without diarrhea: Secondary | ICD-10-CM

## 2013-06-09 DIAGNOSIS — C8307 Small cell B-cell lymphoma, spleen: Secondary | ICD-10-CM

## 2013-06-09 DIAGNOSIS — G4700 Insomnia, unspecified: Secondary | ICD-10-CM

## 2013-06-09 DIAGNOSIS — D649 Anemia, unspecified: Secondary | ICD-10-CM

## 2013-06-09 DIAGNOSIS — C8590 Non-Hodgkin lymphoma, unspecified, unspecified site: Secondary | ICD-10-CM

## 2013-06-09 DIAGNOSIS — R609 Edema, unspecified: Secondary | ICD-10-CM

## 2013-06-09 DIAGNOSIS — Z5112 Encounter for antineoplastic immunotherapy: Secondary | ICD-10-CM

## 2013-06-09 DIAGNOSIS — F411 Generalized anxiety disorder: Secondary | ICD-10-CM

## 2013-06-09 LAB — CBC WITH DIFFERENTIAL/PLATELET
BASO%: 1.1 % (ref 0.0–2.0)
BASOS ABS: 0.1 10*3/uL (ref 0.0–0.1)
EOS%: 0.6 % (ref 0.0–7.0)
Eosinophils Absolute: 0 10*3/uL (ref 0.0–0.5)
HEMATOCRIT: 32.9 % — AB (ref 34.8–46.6)
HEMOGLOBIN: 11 g/dL — AB (ref 11.6–15.9)
LYMPH%: 20.6 % (ref 14.0–49.7)
MCH: 29.4 pg (ref 25.1–34.0)
MCHC: 33.5 g/dL (ref 31.5–36.0)
MCV: 87.6 fL (ref 79.5–101.0)
MONO#: 0.5 10*3/uL (ref 0.1–0.9)
MONO%: 9.9 % (ref 0.0–14.0)
NEUT#: 3.3 10*3/uL (ref 1.5–6.5)
NEUT%: 67.8 % (ref 38.4–76.8)
RBC: 3.75 10*6/uL (ref 3.70–5.45)
RDW: 13.6 % (ref 11.2–14.5)
WBC: 4.9 10*3/uL (ref 3.9–10.3)
lymph#: 1 10*3/uL (ref 0.9–3.3)

## 2013-06-09 LAB — COMPREHENSIVE METABOLIC PANEL (CC13)
ALT: 9 U/L (ref 0–55)
AST: 14 U/L (ref 5–34)
Albumin: 3.7 g/dL (ref 3.5–5.0)
Alkaline Phosphatase: 138 U/L (ref 40–150)
Anion Gap: 10 mEq/L (ref 3–11)
BUN: 31.3 mg/dL — AB (ref 7.0–26.0)
CALCIUM: 9.2 mg/dL (ref 8.4–10.4)
CHLORIDE: 102 meq/L (ref 98–109)
CO2: 24 mEq/L (ref 22–29)
Creatinine: 1.4 mg/dL — ABNORMAL HIGH (ref 0.6–1.1)
Glucose: 86 mg/dl (ref 70–140)
Potassium: 4.3 mEq/L (ref 3.5–5.1)
Sodium: 136 mEq/L (ref 136–145)
Total Bilirubin: 0.44 mg/dL (ref 0.20–1.20)
Total Protein: 5.9 g/dL — ABNORMAL LOW (ref 6.4–8.3)

## 2013-06-09 LAB — URIC ACID (CC13): URIC ACID, SERUM: 3.4 mg/dL (ref 2.6–7.4)

## 2013-06-09 LAB — LACTATE DEHYDROGENASE (CC13): LDH: 172 U/L (ref 125–245)

## 2013-06-09 MED ORDER — FUROSEMIDE 20 MG PO TABS: 20.0000 mg | ORAL_TABLET | Freq: Every day | ORAL | Status: DC

## 2013-06-09 MED ORDER — DEXAMETHASONE SODIUM PHOSPHATE 20 MG/5ML IJ SOLN
20.0000 mg | Freq: Once | INTRAMUSCULAR | Status: AC
  Administered 2013-06-09: 20 mg via INTRAVENOUS

## 2013-06-09 MED ORDER — DIPHENHYDRAMINE HCL 25 MG PO CAPS
ORAL_CAPSULE | ORAL | Status: AC
  Filled 2013-06-09: qty 2

## 2013-06-09 MED ORDER — ACETAMINOPHEN 325 MG PO TABS
ORAL_TABLET | ORAL | Status: AC
  Filled 2013-06-09: qty 2

## 2013-06-09 MED ORDER — DIPHENHYDRAMINE HCL 25 MG PO CAPS
50.0000 mg | ORAL_CAPSULE | Freq: Once | ORAL | Status: AC
  Administered 2013-06-09: 50 mg via ORAL

## 2013-06-09 MED ORDER — ACETAMINOPHEN 325 MG PO TABS
650.0000 mg | ORAL_TABLET | Freq: Once | ORAL | Status: AC
Start: 2013-06-09 — End: 2013-06-09
  Administered 2013-06-09: 650 mg via ORAL

## 2013-06-09 MED ORDER — PROMETHAZINE HCL 25 MG PO TABS
ORAL_TABLET | ORAL | Status: AC
  Filled 2013-06-09: qty 1

## 2013-06-09 MED ORDER — RITUXIMAB CHEMO INJECTION 10 MG/ML
375.0000 mg/m2 | Freq: Once | INTRAVENOUS | Status: AC
  Administered 2013-06-09: 700 mg via INTRAVENOUS
  Filled 2013-06-09: qty 70

## 2013-06-09 MED ORDER — ZOLPIDEM TARTRATE 10 MG PO TABS: 10.0000 mg | ORAL_TABLET | Freq: Every evening | ORAL | Status: DC | PRN

## 2013-06-09 MED ORDER — DEXAMETHASONE SODIUM PHOSPHATE 20 MG/5ML IJ SOLN
INTRAMUSCULAR | Status: AC
  Filled 2013-06-09: qty 5

## 2013-06-09 MED ORDER — SODIUM CHLORIDE 0.9 % IV SOLN
Freq: Once | INTRAVENOUS | Status: AC
  Administered 2013-06-09: 10:00:00 via INTRAVENOUS

## 2013-06-09 MED ORDER — PROMETHAZINE HCL 25 MG/ML IJ SOLN
25.0000 mg | Freq: Once | INTRAMUSCULAR | Status: AC
  Administered 2013-06-09: 25 mg via INTRAVENOUS
  Filled 2013-06-09: qty 1

## 2013-06-09 NOTE — Progress Notes (Signed)
[facility] OFFICE PROGRESS NOTE  Patient Care Team: Ricard Dillon, MD as PCP - General  DIAGNOSIS: Marginal zone lymphoma, seen prior to cycle 2 of rituximab  SUMMARY OF ONCOLOGIC HISTORY: Oncology History   Non-Hodgkin lymphoma, marginal zone lymphoma   Primary site: Lymphoid Neoplasms   Staging method: AJCC 6th Edition   Clinical: Stage IV signed by Heath Lark, MD on 05/30/2013  8:47 AM   Summary: Stage IV       Non-Hodgkin lymphoma   02/23/2013 Imaging CT scan show significant enlargement of liver and spleen   05/05/2013 Bone Marrow Biopsy Bone marrow aspirate and biopsy confirmed low grade lymphoma, suspect marginal zone lymphoma   05/08/2013 Imaging PET scan show involvement of liver and spleen   06/02/2013 -  Chemotherapy She was started on weekly rituximab.    INTERVAL HISTORY: Shannon Barajas 63 y.o. female returns for further followup. She did not have the infusion reaction with cycle 1 but just mild changes in her blood pressure. The patient appears very anxious. She complained of fluid retention and abdominal bloating. She complained of fatigue and continued to have persistent insomnia. The prescribed trazodone did not help. She had some flare of her irritable bowel syndrome. Currently, she is constipated for 2 days. She complained of some cramps in her hands and feet at nighttime. She denies any recent fever, chills, night sweats or abnormal weight loss   I have reviewed the past medical history, past surgical history, social history and family history with the patient and they are unchanged from previous note.  ALLERGIES:  is allergic to biaxin; erythromycin; soy allergy; and zithromax.  MEDICATIONS:  Current Outpatient Prescriptions  Medication Sig Dispense Refill  . allopurinol (ZYLOPRIM) 300 MG tablet Take 1 tablet (300 mg total) by mouth daily.  30 tablet  0  . calcium carbonate (TUMS - DOSED IN MG ELEMENTAL CALCIUM) 500 MG chewable tablet  Chew 1 tablet by mouth 4 (four) times daily as needed for indigestion or heartburn.      . celecoxib (CELEBREX) 400 MG capsule Take 400 mg by mouth daily after breakfast.      . estradiol (ESTRACE) 2 MG tablet Take 2 mg by mouth every evening.       . Fe Fum-FePoly-Vit C-Vit B3 (INTEGRA) 62.5-62.5-40-3 MG CAPS Take 1 capsule by mouth daily.  90 capsule  3  . Linaclotide (LINZESS) 290 MCG CAPS capsule Take 290 mcg by mouth every morning.      . magnesium 30 MG tablet Take 30 mg by mouth daily.      . furosemide (LASIX) 20 MG tablet Take 1 tablet (20 mg total) by mouth daily.  30 tablet  0  . promethazine (PHENERGAN) 25 MG tablet Take 1 tablet (25 mg total) by mouth every 6 (six) hours as needed for nausea or vomiting.  60 tablet  3  . zolpidem (AMBIEN) 10 MG tablet Take 1 tablet (10 mg total) by mouth at bedtime as needed for sleep.  30 tablet  3   No current facility-administered medications for this visit.    REVIEW OF SYSTEMS:   Constitutional: Denies fevers, chills or abnormal weight loss Eyes: Denies blurriness of vision Ears, nose, mouth, throat, and face: Denies mucositis or sore throat Respiratory: Denies cough, dyspnea or wheezes Skin: Denies abnormal skin rashes Lymphatics: Denies new lymphadenopathy or easy bruising Neurological:Denies numbness, tingling or new weaknesses Behavioral/Psych: Mood is stable, no new changes  All other systems were reviewed with  the patient and are negative.  PHYSICAL EXAMINATION: ECOG PERFORMANCE STATUS: 1 - Symptomatic but completely ambulatory  Filed Vitals:   06/09/13 0841  BP: 121/63  Pulse: 83  Temp: 97.3 F (36.3 C)  Resp: 18   Filed Weights   06/09/13 0841  Weight: 147 lb 1.6 oz (66.724 kg)    GENERAL:alert, no distress and comfortable SKIN: skin color, texture, turgor are normal, no rashes or significant lesions EYES: normal, Conjunctiva are pink and non-injected, sclera clear OROPHARYNX:no exudate, no erythema and lips,  buccal mucosa, and tongue normal  NECK: supple, thyroid normal size, non-tender, without nodularity LYMPH:  no palpable lymphadenopathy in the cervical, axillary or inguinal LUNGS: clear to auscultation and percussion with normal breathing effort HEART: regular rate & rhythm and no murmurs with trace bilateral lower extremity edema ABDOMEN:abdomen soft, non-tender and normal bowel sounds Musculoskeletal:no cyanosis of digits and no clubbing  NEURO: alert & oriented x 3 with fluent speech, no focal motor/sensory deficits  LABORATORY DATA:  I have reviewed the data as listed    Component Value Date/Time   NA 136 06/09/2013 0817   NA 139 05/05/2013 0730   K 4.3 06/09/2013 0817   K 3.9 05/05/2013 0730   CL 101 05/05/2013 0730   CO2 24 06/09/2013 0817   CO2 26 05/05/2013 0730   GLUCOSE 86 06/09/2013 0817   GLUCOSE 83 05/05/2013 0730   BUN 31.3* 06/09/2013 0817   BUN 19 05/05/2013 0730   CREATININE 1.4* 06/09/2013 0817   CREATININE 1.51* 05/05/2013 0730   CALCIUM 9.2 06/09/2013 0817   CALCIUM 9.6 05/05/2013 0730   PROT 5.9* 06/09/2013 0817   PROT 6.4 05/05/2013 0730   ALBUMIN 3.7 06/09/2013 0817   ALBUMIN 4.0 05/05/2013 0730   AST 14 06/09/2013 0817   AST 23 05/05/2013 0730   ALT 9 06/09/2013 0817   ALT 17 05/05/2013 0730   ALKPHOS 138 06/09/2013 0817   ALKPHOS 100 05/05/2013 0730   BILITOT 0.44 06/09/2013 0817   BILITOT 0.5 05/05/2013 0730   GFRNONAA 35* 05/05/2013 0730   GFRAA 41* 05/05/2013 0730    No results found for this basename: SPEP,  UPEP,   kappa and lambda light chains    Lab Results  Component Value Date   WBC 4.9 06/09/2013   NEUTROABS 3.3 06/09/2013   HGB 11.0* 06/09/2013   HCT 32.9* 06/09/2013   MCV 87.6 06/09/2013   PLT 82 repeated and verified* 06/09/2013      Chemistry      Component Value Date/Time   NA 136 06/09/2013 0817   NA 139 05/05/2013 0730   K 4.3 06/09/2013 0817   K 3.9 05/05/2013 0730   CL 101 05/05/2013 0730   CO2 24 06/09/2013 0817   CO2 26 05/05/2013 0730   BUN  31.3* 06/09/2013 0817   BUN 19 05/05/2013 0730   CREATININE 1.4* 06/09/2013 0817   CREATININE 1.51* 05/05/2013 0730      Component Value Date/Time   CALCIUM 9.2 06/09/2013 0817   CALCIUM 9.6 05/05/2013 0730   ALKPHOS 138 06/09/2013 0817   ALKPHOS 100 05/05/2013 0730   AST 14 06/09/2013 0817   AST 23 05/05/2013 0730   ALT 9 06/09/2013 0817   ALT 17 05/05/2013 0730   BILITOT 0.44 06/09/2013 0817   BILITOT 0.5 05/05/2013 0730     ASSESSMENT & PLAN:  #1 splenic marginal zone lymphoma with splenomegaly and thrombocytopenia She will continue her treatment today without changes. She felt premedication with Phenergan helped  in the past. I will proceed to add that into her treatment plan. Her thrombocytopenia is stable. I reassured her. She has no bleeding complication. I plan to see her in 2 weeks. #2 anxiety and insomnia I suspect she have a great element of anxiety. I recommend a trial of trazodone but it did not help. She requested Ambien. I gave her prescription to try. #3 tumor lysis prophylaxis She has no signs of tumor lysis syndrome. She will continue on allopurinol. #4 anemia This is likely anemia of chronic disease. The patient denies recent history of bleeding such as epistaxis, hematuria or hematochezia. She is asymptomatic from the anemia. We will observe for now.  She does not require transfusion now.  #5 mildly elevated creatinine This is stable. She is concerned about fluid retention. I gave her prescription of Lasix to try but we'll monitor her kidney function very closely. #6 irritable bowel syndrome I would defer to her regimen that she has at home to treat this.   All questions were answered. The patient knows to call the clinic with any problems, questions or concerns. No barriers to learning was detected.    Heath Lark, MD 06/09/2013 9:33 AM

## 2013-06-09 NOTE — Patient Instructions (Signed)
[facility] Discharge Instructions for Patients Receiving Chemotherapy  Today you received the following chemotherapy agents rituxan.   To help prevent nausea and vomiting after your treatment, we encourage you to take your nausea medication as directed.     If you develop nausea and vomiting that is not controlled by your nausea medication, call the clinic.   BELOW ARE SYMPTOMS THAT SHOULD BE REPORTED IMMEDIATELY:  *FEVER GREATER THAN 100.5 F  *CHILLS WITH OR WITHOUT FEVER  NAUSEA AND VOMITING THAT IS NOT CONTROLLED WITH YOUR NAUSEA MEDICATION  *UNUSUAL SHORTNESS OF BREATH  *UNUSUAL BRUISING OR BLEEDING  TENDERNESS IN MOUTH AND THROAT WITH OR WITHOUT PRESENCE OF ULCERS  *URINARY PROBLEMS  *BOWEL PROBLEMS  UNUSUAL RASH Items with * indicate a potential emergency and should be followed up as soon as possible.  Feel free to call the clinic you have any questions or concerns. The clinic phone number is [phone].  

## 2013-06-09 NOTE — Progress Notes (Signed)
Pt tolerated treatment without problems.

## 2013-06-09 NOTE — Telephone Encounter (Signed)
gv adn printed appt sched and avs for pt for May and June °

## 2013-06-13 ENCOUNTER — Encounter: Payer: Self-pay | Admitting: Hematology and Oncology

## 2013-06-14 ENCOUNTER — Ambulatory Visit: Payer: 59

## 2013-06-16 ENCOUNTER — Other Ambulatory Visit (HOSPITAL_BASED_OUTPATIENT_CLINIC_OR_DEPARTMENT_OTHER): Payer: 59

## 2013-06-16 ENCOUNTER — Ambulatory Visit (HOSPITAL_BASED_OUTPATIENT_CLINIC_OR_DEPARTMENT_OTHER): Payer: 59

## 2013-06-16 ENCOUNTER — Encounter: Payer: Self-pay | Admitting: Pharmacist

## 2013-06-16 ENCOUNTER — Other Ambulatory Visit: Payer: Self-pay | Admitting: Hematology and Oncology

## 2013-06-16 VITALS — BP 107/72 | HR 75 | Temp 98.4°F | Resp 18

## 2013-06-16 DIAGNOSIS — R161 Splenomegaly, not elsewhere classified: Secondary | ICD-10-CM

## 2013-06-16 DIAGNOSIS — C8307 Small cell B-cell lymphoma, spleen: Secondary | ICD-10-CM

## 2013-06-16 DIAGNOSIS — C8590 Non-Hodgkin lymphoma, unspecified, unspecified site: Secondary | ICD-10-CM

## 2013-06-16 DIAGNOSIS — D696 Thrombocytopenia, unspecified: Secondary | ICD-10-CM

## 2013-06-16 DIAGNOSIS — R7989 Other specified abnormal findings of blood chemistry: Secondary | ICD-10-CM

## 2013-06-16 DIAGNOSIS — Z5112 Encounter for antineoplastic immunotherapy: Secondary | ICD-10-CM

## 2013-06-16 LAB — CBC WITH DIFFERENTIAL/PLATELET
BASO%: 0.1 % (ref 0.0–2.0)
Basophils Absolute: 0 10*3/uL (ref 0.0–0.1)
EOS%: 1 % (ref 0.0–7.0)
Eosinophils Absolute: 0.1 10*3/uL (ref 0.0–0.5)
HCT: 33 % — ABNORMAL LOW (ref 34.8–46.6)
HGB: 11 g/dL — ABNORMAL LOW (ref 11.6–15.9)
LYMPH%: 23.6 % (ref 14.0–49.7)
MCH: 29.6 pg (ref 25.1–34.0)
MCHC: 33.3 g/dL (ref 31.5–36.0)
MCV: 88.9 fL (ref 79.5–101.0)
MONO#: 0.3 10*3/uL (ref 0.1–0.9)
MONO%: 5.3 % (ref 0.0–14.0)
NEUT%: 70 % (ref 38.4–76.8)
NEUTROS ABS: 4.1 10*3/uL (ref 1.5–6.5)
Platelets: 105 10*3/uL — ABNORMAL LOW (ref 145–400)
RBC: 3.71 10*6/uL (ref 3.70–5.45)
RDW: 13.7 % (ref 11.2–14.5)
WBC: 5.8 10*3/uL (ref 3.9–10.3)
lymph#: 1.4 10*3/uL (ref 0.9–3.3)

## 2013-06-16 LAB — COMPREHENSIVE METABOLIC PANEL (CC13)
ALBUMIN: 3.7 g/dL (ref 3.5–5.0)
ALK PHOS: 128 U/L (ref 40–150)
ALT: 9 U/L (ref 0–55)
AST: 14 U/L (ref 5–34)
Anion Gap: 12 mEq/L — ABNORMAL HIGH (ref 3–11)
BUN: 28.9 mg/dL — ABNORMAL HIGH (ref 7.0–26.0)
CALCIUM: 9.2 mg/dL (ref 8.4–10.4)
CHLORIDE: 102 meq/L (ref 98–109)
CO2: 24 mEq/L (ref 22–29)
Creatinine: 1.7 mg/dL — ABNORMAL HIGH (ref 0.6–1.1)
GLUCOSE: 79 mg/dL (ref 70–140)
POTASSIUM: 3.9 meq/L (ref 3.5–5.1)
SODIUM: 138 meq/L (ref 136–145)
TOTAL PROTEIN: 5.9 g/dL — AB (ref 6.4–8.3)
Total Bilirubin: 0.4 mg/dL (ref 0.20–1.20)

## 2013-06-16 LAB — URIC ACID (CC13): Uric Acid, Serum: 3.4 mg/dl (ref 2.6–7.4)

## 2013-06-16 LAB — LACTATE DEHYDROGENASE (CC13): LDH: 182 U/L (ref 125–245)

## 2013-06-16 MED ORDER — SODIUM CHLORIDE 0.9 % IV SOLN
Freq: Once | INTRAVENOUS | Status: AC
  Administered 2013-06-16: 09:00:00 via INTRAVENOUS

## 2013-06-16 MED ORDER — SODIUM CHLORIDE 0.9 % IV SOLN
375.0000 mg/m2 | Freq: Once | INTRAVENOUS | Status: AC
  Administered 2013-06-16: 700 mg via INTRAVENOUS
  Filled 2013-06-16: qty 70

## 2013-06-16 MED ORDER — DEXAMETHASONE SODIUM PHOSPHATE 20 MG/5ML IJ SOLN
INTRAMUSCULAR | Status: AC
  Filled 2013-06-16: qty 5

## 2013-06-16 MED ORDER — DIPHENHYDRAMINE HCL 25 MG PO CAPS
50.0000 mg | ORAL_CAPSULE | Freq: Once | ORAL | Status: AC
  Administered 2013-06-16: 50 mg via ORAL

## 2013-06-16 MED ORDER — DEXAMETHASONE SODIUM PHOSPHATE 20 MG/5ML IJ SOLN
20.0000 mg | Freq: Once | INTRAMUSCULAR | Status: AC
  Administered 2013-06-16: 20 mg via INTRAVENOUS

## 2013-06-16 MED ORDER — ACETAMINOPHEN 325 MG PO TABS
ORAL_TABLET | ORAL | Status: AC
Start: 2013-06-16 — End: 2013-06-16
  Filled 2013-06-16: qty 2

## 2013-06-16 MED ORDER — DIPHENHYDRAMINE HCL 25 MG PO CAPS
ORAL_CAPSULE | ORAL | Status: AC
  Filled 2013-06-16: qty 2

## 2013-06-16 MED ORDER — RITUXIMAB CHEMO INJECTION 10 MG/ML: 375.0000 mg/m2 | Freq: Once | INTRAVENOUS | Status: DC

## 2013-06-16 MED ORDER — PROMETHAZINE HCL 25 MG/ML IJ SOLN
25.0000 mg | Freq: Once | INTRAMUSCULAR | Status: AC
  Administered 2013-06-16: 25 mg via INTRAVENOUS
  Filled 2013-06-16: qty 1

## 2013-06-16 MED ORDER — ACETAMINOPHEN 325 MG PO TABS
650.0000 mg | ORAL_TABLET | Freq: Once | ORAL | Status: AC
  Administered 2013-06-16: 650 mg via ORAL

## 2013-06-16 NOTE — Progress Notes (Signed)
0955 pt reports R hand tingling without numbness. Denies burning at IV site or otherwise. Pt denies other sx of hypersensitivity, no breathing changes. VSS. Rate continued at 100 ml/hr x30 minutes. Will monitor patient.

## 2013-06-16 NOTE — Patient Instructions (Signed)
[facility] Discharge Instructions for Patients Receiving Chemotherapy  Today you received the following chemotherapy agents rituxan.   To help prevent nausea and vomiting after your treatment, we encourage you to take your nausea medication as directed.     If you develop nausea and vomiting that is not controlled by your nausea medication, call the clinic.   BELOW ARE SYMPTOMS THAT SHOULD BE REPORTED IMMEDIATELY:  *FEVER GREATER THAN 100.5 F  *CHILLS WITH OR WITHOUT FEVER  NAUSEA AND VOMITING THAT IS NOT CONTROLLED WITH YOUR NAUSEA MEDICATION  *UNUSUAL SHORTNESS OF BREATH  *UNUSUAL BRUISING OR BLEEDING  TENDERNESS IN MOUTH AND THROAT WITH OR WITHOUT PRESENCE OF ULCERS  *URINARY PROBLEMS  *BOWEL PROBLEMS  UNUSUAL RASH Items with * indicate a potential emergency and should be followed up as soon as possible.  Feel free to call the clinic you have any questions or concerns. The clinic phone number is [phone].  

## 2013-06-19 ENCOUNTER — Telehealth: Payer: Self-pay | Admitting: Internal Medicine

## 2013-06-19 MED ORDER — INTEGRA 62.5-62.5-40-3 MG PO CAPS: 1.0000 | ORAL_CAPSULE | Freq: Every day | ORAL | Status: DC

## 2013-06-19 NOTE — Telephone Encounter (Signed)
[facility] - [place], [place] - [address]. Is requesting re-fill on Fe Fum-FePoly-Vit C-Vit B3 (INTEGRA) 62.5-62.5-40-3 MG CAPS

## 2013-06-19 NOTE — Telephone Encounter (Signed)
Rx sent to pharmacy   

## 2013-06-21 ENCOUNTER — Ambulatory Visit: Payer: 59

## 2013-06-23 ENCOUNTER — Other Ambulatory Visit (HOSPITAL_BASED_OUTPATIENT_CLINIC_OR_DEPARTMENT_OTHER): Payer: 59

## 2013-06-23 ENCOUNTER — Ambulatory Visit (HOSPITAL_BASED_OUTPATIENT_CLINIC_OR_DEPARTMENT_OTHER): Payer: 59 | Admitting: Hematology and Oncology

## 2013-06-23 ENCOUNTER — Encounter: Payer: Self-pay | Admitting: Hematology and Oncology

## 2013-06-23 ENCOUNTER — Telehealth: Payer: Self-pay | Admitting: Hematology and Oncology

## 2013-06-23 ENCOUNTER — Ambulatory Visit (HOSPITAL_BASED_OUTPATIENT_CLINIC_OR_DEPARTMENT_OTHER): Payer: 59

## 2013-06-23 VITALS — BP 113/75 | HR 76 | Temp 97.9°F | Resp 17

## 2013-06-23 VITALS — BP 134/65 | HR 90 | Temp 97.8°F | Resp 20 | Ht 67.0 in | Wt 146.1 lb

## 2013-06-23 DIAGNOSIS — C8590 Non-Hodgkin lymphoma, unspecified, unspecified site: Secondary | ICD-10-CM

## 2013-06-23 DIAGNOSIS — D696 Thrombocytopenia, unspecified: Secondary | ICD-10-CM

## 2013-06-23 DIAGNOSIS — C8307 Small cell B-cell lymphoma, spleen: Secondary | ICD-10-CM

## 2013-06-23 DIAGNOSIS — Z5112 Encounter for antineoplastic immunotherapy: Secondary | ICD-10-CM

## 2013-06-23 DIAGNOSIS — R161 Splenomegaly, not elsewhere classified: Secondary | ICD-10-CM

## 2013-06-23 DIAGNOSIS — R609 Edema, unspecified: Secondary | ICD-10-CM

## 2013-06-23 DIAGNOSIS — R600 Localized edema: Secondary | ICD-10-CM

## 2013-06-23 LAB — CBC WITH DIFFERENTIAL/PLATELET
BASO%: 1.4 % (ref 0.0–2.0)
BASOS ABS: 0.1 10*3/uL (ref 0.0–0.1)
EOS%: 4.5 % (ref 0.0–7.0)
Eosinophils Absolute: 0.3 10*3/uL (ref 0.0–0.5)
HCT: 35.5 % (ref 34.8–46.6)
HEMOGLOBIN: 11.8 g/dL (ref 11.6–15.9)
LYMPH%: 19.9 % (ref 14.0–49.7)
MCH: 29.6 pg (ref 25.1–34.0)
MCHC: 33.3 g/dL (ref 31.5–36.0)
MCV: 89.1 fL (ref 79.5–101.0)
MONO#: 0.5 10*3/uL (ref 0.1–0.9)
MONO%: 7.6 % (ref 0.0–14.0)
NEUT#: 4.2 10*3/uL (ref 1.5–6.5)
NEUT%: 66.6 % (ref 38.4–76.8)
Platelets: 135 10*3/uL — ABNORMAL LOW (ref 145–400)
RBC: 3.98 10*6/uL (ref 3.70–5.45)
RDW: 13.6 % (ref 11.2–14.5)
WBC: 6.3 10*3/uL (ref 3.9–10.3)
lymph#: 1.3 10*3/uL (ref 0.9–3.3)

## 2013-06-23 LAB — LACTATE DEHYDROGENASE (CC13): LDH: 180 U/L (ref 125–245)

## 2013-06-23 LAB — URIC ACID (CC13): URIC ACID, SERUM: 3.7 mg/dL (ref 2.6–7.4)

## 2013-06-23 LAB — COMPREHENSIVE METABOLIC PANEL (CC13)
ALBUMIN: 4 g/dL (ref 3.5–5.0)
ALK PHOS: 122 U/L (ref 40–150)
ALT: 18 U/L (ref 0–55)
AST: 18 U/L (ref 5–34)
Anion Gap: 11 mEq/L (ref 3–11)
BUN: 24.2 mg/dL (ref 7.0–26.0)
CO2: 25 mEq/L (ref 22–29)
Calcium: 9.5 mg/dL (ref 8.4–10.4)
Chloride: 100 mEq/L (ref 98–109)
Creatinine: 1.4 mg/dL — ABNORMAL HIGH (ref 0.6–1.1)
Glucose: 83 mg/dl (ref 70–140)
POTASSIUM: 3.8 meq/L (ref 3.5–5.1)
Sodium: 137 mEq/L (ref 136–145)
Total Bilirubin: 0.52 mg/dL (ref 0.20–1.20)
Total Protein: 6.4 g/dL (ref 6.4–8.3)

## 2013-06-23 MED ORDER — DEXAMETHASONE SODIUM PHOSPHATE 20 MG/5ML IJ SOLN
20.0000 mg | Freq: Once | INTRAMUSCULAR | Status: AC
  Administered 2013-06-23: 20 mg via INTRAVENOUS

## 2013-06-23 MED ORDER — ACETAMINOPHEN 325 MG PO TABS
650.0000 mg | ORAL_TABLET | Freq: Once | ORAL | Status: AC
  Administered 2013-06-23: 650 mg via ORAL

## 2013-06-23 MED ORDER — DIPHENHYDRAMINE HCL 25 MG PO CAPS
ORAL_CAPSULE | ORAL | Status: AC
  Filled 2013-06-23: qty 2

## 2013-06-23 MED ORDER — DIPHENHYDRAMINE HCL 25 MG PO CAPS
50.0000 mg | ORAL_CAPSULE | Freq: Once | ORAL | Status: AC
  Administered 2013-06-23: 50 mg via ORAL

## 2013-06-23 MED ORDER — DIPHENHYDRAMINE HCL 50 MG/ML IJ SOLN
INTRAMUSCULAR | Status: AC
  Filled 2013-06-23: qty 1

## 2013-06-23 MED ORDER — SODIUM CHLORIDE 0.9 % IV SOLN
Freq: Once | INTRAVENOUS | Status: AC
  Administered 2013-06-23: 10:00:00 via INTRAVENOUS

## 2013-06-23 MED ORDER — RITUXIMAB CHEMO INJECTION 10 MG/ML
375.0000 mg/m2 | Freq: Once | INTRAVENOUS | Status: AC
  Administered 2013-06-23: 700 mg via INTRAVENOUS
  Filled 2013-06-23: qty 70

## 2013-06-23 MED ORDER — ACETAMINOPHEN 325 MG PO TABS
ORAL_TABLET | ORAL | Status: AC
Start: 2013-06-23 — End: 2013-06-23
  Filled 2013-06-23: qty 2

## 2013-06-23 MED ORDER — DEXAMETHASONE SODIUM PHOSPHATE 20 MG/5ML IJ SOLN
INTRAMUSCULAR | Status: AC
  Filled 2013-06-23: qty 5

## 2013-06-23 MED ORDER — PROMETHAZINE HCL 25 MG/ML IJ SOLN
25.0000 mg | Freq: Once | INTRAMUSCULAR | Status: AC
  Administered 2013-06-23: 25 mg via INTRAVENOUS
  Filled 2013-06-23: qty 1

## 2013-06-23 NOTE — Progress Notes (Signed)
Rituxan infusion completed without difficulty. Daughter at chairside, explained safety precautions to her, as she wants to be involved in care for mother. Since patient received phenergan during tx, patient became drowsy, so this RN and tech assisted patient in wheelchair during tx and at discharge. Daughter upset she could not push patient, but we assured her this is for both her and patient safety to prevent falls. Voiced understanding. Patient discharged via wheelchair in no acute distress.

## 2013-06-23 NOTE — Progress Notes (Signed)
[facility] OFFICE PROGRESS NOTE  Patient Care Team: Ricard Dillon, MD as PCP - General  SUMMARY OF ONCOLOGIC HISTORY: Oncology History   Non-Hodgkin lymphoma, marginal zone lymphoma   Primary site: Lymphoid Neoplasms   Staging method: AJCC 6th Edition   Clinical: Stage IV signed by Heath Lark, MD on 05/30/2013  8:47 AM   Summary: Stage IV       Non-Hodgkin lymphoma   02/23/2013 Imaging CT scan show significant enlargement of liver and spleen   05/05/2013 Bone Marrow Biopsy Bone marrow aspirate and biopsy confirmed low grade lymphoma, suspect marginal zone lymphoma   05/08/2013 Imaging PET scan show involvement of liver and spleen   06/02/2013 - 06/23/2013 Chemotherapy She was started on weekly rituximab.    INTERVAL HISTORY: Please see below for problem oriented charting. She is seen today prior to last infusion of rituximab. She complained of some mild leg swelling and fatigue.  REVIEW OF SYSTEMS:   Constitutional: Denies fevers, chills or abnormal weight loss Eyes: Denies blurriness of vision Ears, nose, mouth, throat, and face: Denies mucositis or sore throat Respiratory: Denies cough, dyspnea or wheezes Cardiovascular: Denies palpitation, chest discomfort  Skin: Denies abnormal skin rashes Lymphatics: Denies new lymphadenopathy or easy bruising Neurological:Denies numbness, tingling or new weaknesses Behavioral/Psych: Mood is stable, no new changes  All other systems were reviewed with the patient and are negative.  I have reviewed the past medical history, past surgical history, social history and family history with the patient and they are unchanged from previous note.  ALLERGIES:  is allergic to biaxin; erythromycin; soy allergy; and zithromax.  MEDICATIONS:  Current Outpatient Prescriptions  Medication Sig Dispense Refill  . allopurinol (ZYLOPRIM) 300 MG tablet Take 1 tablet (300 mg total) by mouth daily.  30 tablet  0  . calcium carbonate (TUMS -  DOSED IN MG ELEMENTAL CALCIUM) 500 MG chewable tablet Chew 1 tablet by mouth 4 (four) times daily as needed for indigestion or heartburn.      . estradiol (ESTRACE) 2 MG tablet Take 2 mg by mouth every evening.       . Fe Fum-FePoly-Vit C-Vit B3 (INTEGRA) 62.5-62.5-40-3 MG CAPS Take 1 capsule by mouth daily.  90 capsule  1  . furosemide (LASIX) 20 MG tablet Take 1 tablet (20 mg total) by mouth daily.  30 tablet  0  . Linaclotide (LINZESS) 290 MCG CAPS capsule Take 290 mcg by mouth every morning.      . metoCLOPramide (REGLAN) 5 MG tablet 5 mg 2 (two) times daily.      . progesterone (PROMETRIUM) 100 MG capsule 100 mg. For 12 days after Estradiol      . promethazine (PHENERGAN) 25 MG tablet Take 1 tablet (25 mg total) by mouth every 6 (six) hours as needed for nausea or vomiting.  60 tablet  3  . temazepam (RESTORIL) 30 MG capsule 30 mg. Takes 2 nightly      . traZODone (DESYREL) 100 MG tablet Take 100 mg by mouth at bedtime. Takes 1/2      . celecoxib (CELEBREX) 400 MG capsule Take 400 mg by mouth daily after breakfast.      . magnesium 30 MG tablet Take 30 mg by mouth daily.      Marland Kitchen zolpidem (AMBIEN) 10 MG tablet Take 1 tablet (10 mg total) by mouth at bedtime as needed for sleep.  30 tablet  3   No current facility-administered medications for this visit.    PHYSICAL  EXAMINATION: ECOG PERFORMANCE STATUS: 1 - Symptomatic but completely ambulatory  Filed Vitals:   06/23/13 0849  BP: 134/65  Pulse: 90  Temp: 97.8 F (36.6 C)  Resp: 20   Filed Weights   06/23/13 0849  Weight: 146 lb 1.6 oz (66.271 kg)    GENERAL:alert, no distress and comfortable SKIN: skin color, texture, turgor are normal, no rashes or significant lesions EYES: normal, Conjunctiva are pink and non-injected, sclera clear OROPHARYNX:no exudate, no erythema and lips, buccal mucosa, and tongue normal  NECK: supple, thyroid normal size, non-tender, without nodularity LYMPH:  no palpable lymphadenopathy in the  cervical, axillary or inguinal LUNGS: clear to auscultation and percussion with normal breathing effort HEART: regular rate & rhythm and no murmurs and no lower extremity edema ABDOMEN:abdomen soft, non-tender and normal bowel sounds. No palpable splenomegaly Musculoskeletal:no cyanosis of digits and no clubbing  NEURO: alert & oriented x 3 with fluent speech, no focal motor/sensory deficits  LABORATORY DATA:  I have reviewed the data as listed    Component Value Date/Time   NA 137 06/23/2013 0839   NA 139 05/05/2013 0730   K 3.8 06/23/2013 0839   K 3.9 05/05/2013 0730   CL 101 05/05/2013 0730   CO2 25 06/23/2013 0839   CO2 26 05/05/2013 0730   GLUCOSE 83 06/23/2013 0839   GLUCOSE 83 05/05/2013 0730   BUN 24.2 06/23/2013 0839   BUN 19 05/05/2013 0730   CREATININE 1.4* 06/23/2013 0839   CREATININE 1.51* 05/05/2013 0730   CALCIUM 9.5 06/23/2013 0839   CALCIUM 9.6 05/05/2013 0730   PROT 6.4 06/23/2013 0839   PROT 6.4 05/05/2013 0730   ALBUMIN 4.0 06/23/2013 0839   ALBUMIN 4.0 05/05/2013 0730   AST 18 06/23/2013 0839   AST 23 05/05/2013 0730   ALT 18 06/23/2013 0839   ALT 17 05/05/2013 0730   ALKPHOS 122 06/23/2013 0839   ALKPHOS 100 05/05/2013 0730   BILITOT 0.52 06/23/2013 0839   BILITOT 0.5 05/05/2013 0730   GFRNONAA 35* 05/05/2013 0730   GFRAA 41* 05/05/2013 0730    No results found for this basename: SPEP, UPEP,  kappa and lambda light chains    Lab Results  Component Value Date   WBC 6.3 06/23/2013   NEUTROABS 4.2 06/23/2013   HGB 11.8 06/23/2013   HCT 35.5 06/23/2013   MCV 89.1 06/23/2013   PLT 135* 06/23/2013      Chemistry      Component Value Date/Time   NA 137 06/23/2013 0839   NA 139 05/05/2013 0730   K 3.8 06/23/2013 0839   K 3.9 05/05/2013 0730   CL 101 05/05/2013 0730   CO2 25 06/23/2013 0839   CO2 26 05/05/2013 0730   BUN 24.2 06/23/2013 0839   BUN 19 05/05/2013 0730   CREATININE 1.4* 06/23/2013 0839   CREATININE 1.51* 05/05/2013 0730      Component Value Date/Time   CALCIUM 9.5 06/23/2013 0839   CALCIUM  9.6 05/05/2013 0730   ALKPHOS 122 06/23/2013 0839   ALKPHOS 100 05/05/2013 0730   AST 18 06/23/2013 0839   AST 23 05/05/2013 0730   ALT 18 06/23/2013 0839   ALT 17 05/05/2013 0730   BILITOT 0.52 06/23/2013 0839   BILITOT 0.5 05/05/2013 0730     ASSESSMENT & PLAN:  Non-Hodgkin lymphoma She tolerated recent infusion well. She felt that her abdominal mass is shrinking. She still has some fatigue. I recommend we proceed with last treatment today. I plan to order repeat  PET/CT scan in August and to see her back to assess response to treatment. The patient is encouraged to see that her platelet count is now near normal.  SPLENOMEGALY This is due to cancer. Clinically, it is shrinking. I suspect she is improving on treatment.  THROMBOCYTOPENIA This is improving while on treatment. Continue observation.  Leg edema This is due to fluid retention. I recommend she increase mobility. I rather not put her on a diuretic. This will improve once we discontinue treatment.   Orders Placed This Encounter  Procedures  . NM PET Image Restag (PS) Skull Base To Thigh    Standing Status: Future     Number of Occurrences:      Standing Expiration Date: 08/23/2014    Order Specific Question:  Reason for Exam (SYMPTOM  OR DIAGNOSIS REQUIRED)    Answer:  staging lymphoma assess response to Rx    Order Specific Question:  Preferred imaging location?    Answer:  [facility]  . IgG, IgA, IgM    Standing Status: Future     Number of Occurrences:      Standing Expiration Date: 06/23/2014   All questions were answered. The patient knows to call the clinic with any problems, questions or concerns. No barriers to learning was detected.    Heath Lark, MD 06/23/2013 10:39 AM

## 2013-06-23 NOTE — Telephone Encounter (Signed)
gv adn printed appt sched anda vs for pt for Aug °

## 2013-06-23 NOTE — Assessment & Plan Note (Signed)
She tolerated recent infusion well. She felt that her abdominal mass is shrinking. She still has some fatigue. I recommend we proceed with last treatment today. I plan to order repeat PET/CT scan in August and to see her back to assess response to treatment. The patient is encouraged to see that her platelet count is now near normal.

## 2013-06-23 NOTE — Assessment & Plan Note (Signed)
This is due to fluid retention. I recommend she increase mobility. I rather not put her on a diuretic. This will improve once we discontinue treatment.

## 2013-06-23 NOTE — Assessment & Plan Note (Signed)
This is improving while on treatment. Continue observation.

## 2013-06-23 NOTE — Assessment & Plan Note (Signed)
This is due to cancer. Clinically, it is shrinking. I suspect she is improving on treatment.

## 2013-06-23 NOTE — Patient Instructions (Signed)
[facility] Discharge Instructions for Patients Receiving Chemotherapy  Today you received the following chemotherapy agents: Rituxan  To help prevent nausea and vomiting after your treatment, we encourage you to take your nausea medication: Phenergan 25 mg every 6 hrs as needed.    If you develop nausea and vomiting that is not controlled by your nausea medication, call the clinic.   BELOW ARE SYMPTOMS THAT SHOULD BE REPORTED IMMEDIATELY:  *FEVER GREATER THAN 100.5 F  *CHILLS WITH OR WITHOUT FEVER  NAUSEA AND VOMITING THAT IS NOT CONTROLLED WITH YOUR NAUSEA MEDICATION  *UNUSUAL SHORTNESS OF BREATH  *UNUSUAL BRUISING OR BLEEDING  TENDERNESS IN MOUTH AND THROAT WITH OR WITHOUT PRESENCE OF ULCERS  *URINARY PROBLEMS  *BOWEL PROBLEMS  UNUSUAL RASH Items with * indicate a potential emergency and should be followed up as soon as possible.  Feel free to call the clinic you have any questions or concerns. The clinic phone number is [phone].

## 2013-06-26 ENCOUNTER — Encounter: Payer: Self-pay | Admitting: Hematology and Oncology

## 2013-06-26 NOTE — Progress Notes (Signed)
Called and left message for patient to call me back. I need current income info for possible asst with [employer].

## 2013-06-27 ENCOUNTER — Encounter: Payer: Self-pay | Admitting: Hematology and Oncology

## 2013-06-27 NOTE — Progress Notes (Signed)
Called to let the patient know with L&L she is overqual, but if her hours change to let me know and will send again. Still waiting on other- [employer].

## 2013-06-28 ENCOUNTER — Encounter: Payer: Self-pay | Admitting: Hematology and Oncology

## 2013-06-28 NOTE — Progress Notes (Signed)
Hubby came by and I was not in office-he didn't check up front, so she called and left a message to mail. Will do today.

## 2013-06-30 ENCOUNTER — Other Ambulatory Visit: Payer: 59

## 2013-07-03 ENCOUNTER — Telehealth: Payer: Self-pay | Admitting: *Deleted

## 2013-07-03 NOTE — Telephone Encounter (Signed)
Pt reports was started on Lasix 5/22 for edema.  She is not sure her edema is any better, but she is having some feet and leg cramping over the past week which has been severe over the past few days.  She feels her swelling "comes and goes" so it is difficult to tell if lasix is helping.  She is urinating a lot more than usual.  She asks if the cramping is r/t the lasix or the rituxan? Anything she can do?

## 2013-07-03 NOTE — Telephone Encounter (Signed)
Instructed pt to stop lasix for now and massage legs/ feet as needed.  Call us back if this does not help the cramping. She verbalized understanding.

## 2013-07-03 NOTE — Telephone Encounter (Signed)
Cramps could be due to low electrolytes from Lasix Recommend stop Lasix for now and monitor Would not recommend anything for now, massage therapy until we repeat labs again

## 2013-07-04 ENCOUNTER — Telehealth: Payer: Self-pay | Admitting: *Deleted

## 2013-07-04 ENCOUNTER — Other Ambulatory Visit: Payer: Self-pay | Admitting: Hematology and Oncology

## 2013-07-04 NOTE — Telephone Encounter (Signed)
Pt concerned about new bruising under her fingernails.  She is not due for labs again until 8/17.  She asks if ok to have labs checked any sooner, maybe in one month instead of two?

## 2013-07-04 NOTE — Telephone Encounter (Signed)
Informed pt Dr. Alvy Bimler will recheck her labs again early/mid July.  Pt requests lab appt on July 10 th at 3 pm.  POF sent.

## 2013-07-04 NOTE — Telephone Encounter (Signed)
Sure, let's do it in early/mid july

## 2013-07-06 ENCOUNTER — Telehealth: Payer: Self-pay | Admitting: Hematology and Oncology

## 2013-07-06 NOTE — Telephone Encounter (Signed)
lvm for pt regarding to July and Aug appts....mailed pt appt sched/avs and letter

## 2013-07-10 ENCOUNTER — Other Ambulatory Visit: Payer: Self-pay | Admitting: Internal Medicine

## 2013-07-12 ENCOUNTER — Telehealth: Payer: Self-pay | Admitting: Internal Medicine

## 2013-07-12 DIAGNOSIS — C8590 Non-Hodgkin lymphoma, unspecified, unspecified site: Secondary | ICD-10-CM

## 2013-07-12 MED ORDER — TEMAZEPAM 30 MG PO CAPS: 60.0000 mg | ORAL_CAPSULE | Freq: Every day | ORAL | Status: DC

## 2013-07-12 NOTE — Telephone Encounter (Signed)
Pt  Would like a refill of temazepam (RESTORIL) 30 MG capsule Pt has one tab left.  Pt called pharm on Monday. Pt is goiing [place] out pt pharm Can we get someone to fill this?

## 2013-07-12 NOTE — Telephone Encounter (Signed)
Ok x5 per Dr Arnoldo Morale, rx faxed to [facility]

## 2013-07-14 ENCOUNTER — Telehealth: Payer: Self-pay | Admitting: *Deleted

## 2013-07-14 ENCOUNTER — Other Ambulatory Visit: Payer: Self-pay | Admitting: *Deleted

## 2013-07-14 ENCOUNTER — Telehealth: Payer: Self-pay | Admitting: Internal Medicine

## 2013-07-14 ENCOUNTER — Other Ambulatory Visit: Payer: Self-pay | Admitting: Internal Medicine

## 2013-07-14 DIAGNOSIS — C8590 Non-Hodgkin lymphoma, unspecified, unspecified site: Secondary | ICD-10-CM

## 2013-07-14 MED ORDER — HYDROCHLOROTHIAZIDE 25 MG PO TABS: 25.0000 mg | ORAL_TABLET | Freq: Every morning | ORAL | Status: DC

## 2013-07-14 MED ORDER — LINACLOTIDE 290 MCG PO CAPS: 290.0000 ug | ORAL_CAPSULE | Freq: Every morning | ORAL | Status: AC

## 2013-07-14 NOTE — Telephone Encounter (Signed)
Pt req rx on Linaclotide (LINZESS) 290 MCG CAPS capsule  Ph[facility] [address]

## 2013-07-14 NOTE — Telephone Encounter (Signed)
Pt called and left message re:  Pt has more swelling in feet and throughout body.  Pt had Lasix but was instructed by Dr. Alvy Bimler to stop taking due to cramping side effects.   Spoke with pt and was informed that pt experienced one episode of cramping last night but resolved now.  Pt stated she sits most of the time at work even with leg rest.  Denied short of breath, voiding fine.  Pt would like to know if md could recommend another diuretic for pt. Pt's  Phone   [phone].

## 2013-07-14 NOTE — Telephone Encounter (Signed)
rx sent in electronically 

## 2013-07-14 NOTE — Telephone Encounter (Signed)
Spoke with pt and informed pt that Dr. Alvy Bimler has prescribed  HCTZ 25mg   Every  AM for pt.  Rx called in to [facility] as per pt's request.

## 2013-07-20 ENCOUNTER — Encounter: Payer: Self-pay | Admitting: *Deleted

## 2013-07-20 ENCOUNTER — Encounter: Payer: Self-pay | Admitting: Hematology and Oncology

## 2013-07-20 NOTE — Progress Notes (Signed)
Put daughter's fmla form on nurse's desk. °

## 2013-07-20 NOTE — Progress Notes (Signed)
Shannon Barajas  Clinical Social Barajas was referred by patient to review and complete healthcare advance directives.  Clinical Social Worker met with patient and spouse in [place] office.  The patient designated spouse Shannon Barajas as their primary healthcare agent and daughers Shannon Barajas [name] as their secondary agent.  Patient also completed healthcare living will.    Clinical Social Worker notarized documents and made copies for patient/family. Clinical Social Worker will send documents to medical records to be scanned into patient's chart. Clinical Social Worker encouraged patient/family to contact with any additional questions or concerns.  Polo Riley, MSW, [place] Worker [facility] [phone]

## 2013-07-24 ENCOUNTER — Encounter: Payer: Self-pay | Admitting: *Deleted

## 2013-07-24 NOTE — Progress Notes (Signed)
FMLA paperwork for pt's daughter, Kailene Steinhart,  Signed by Dr. Alvy Bimler and returned to Carmelina Noun in managed care dept.Marland Kitchen

## 2013-07-26 ENCOUNTER — Ambulatory Visit (HOSPITAL_BASED_OUTPATIENT_CLINIC_OR_DEPARTMENT_OTHER): Payer: 59 | Admitting: Hematology and Oncology

## 2013-07-26 ENCOUNTER — Other Ambulatory Visit (HOSPITAL_BASED_OUTPATIENT_CLINIC_OR_DEPARTMENT_OTHER): Payer: 59

## 2013-07-26 ENCOUNTER — Telehealth: Payer: Self-pay | Admitting: Hematology and Oncology

## 2013-07-26 ENCOUNTER — Telehealth: Payer: Self-pay | Admitting: *Deleted

## 2013-07-26 ENCOUNTER — Encounter: Payer: Self-pay | Admitting: Hematology and Oncology

## 2013-07-26 ENCOUNTER — Other Ambulatory Visit: Payer: Self-pay | Admitting: Hematology and Oncology

## 2013-07-26 VITALS — BP 108/68 | HR 76 | Temp 97.0°F | Resp 18 | Ht 67.0 in | Wt 144.4 lb

## 2013-07-26 DIAGNOSIS — C8590 Non-Hodgkin lymphoma, unspecified, unspecified site: Secondary | ICD-10-CM

## 2013-07-26 DIAGNOSIS — R161 Splenomegaly, not elsewhere classified: Secondary | ICD-10-CM

## 2013-07-26 DIAGNOSIS — K625 Hemorrhage of anus and rectum: Secondary | ICD-10-CM

## 2013-07-26 DIAGNOSIS — K648 Other hemorrhoids: Secondary | ICD-10-CM

## 2013-07-26 DIAGNOSIS — D696 Thrombocytopenia, unspecified: Secondary | ICD-10-CM

## 2013-07-26 DIAGNOSIS — K642 Third degree hemorrhoids: Secondary | ICD-10-CM

## 2013-07-26 DIAGNOSIS — R609 Edema, unspecified: Secondary | ICD-10-CM

## 2013-07-26 DIAGNOSIS — K644 Residual hemorrhoidal skin tags: Secondary | ICD-10-CM

## 2013-07-26 DIAGNOSIS — K649 Unspecified hemorrhoids: Secondary | ICD-10-CM | POA: Insufficient documentation

## 2013-07-26 DIAGNOSIS — R600 Localized edema: Secondary | ICD-10-CM

## 2013-07-26 DIAGNOSIS — E871 Hypo-osmolality and hyponatremia: Secondary | ICD-10-CM

## 2013-07-26 DIAGNOSIS — C8307 Small cell B-cell lymphoma, spleen: Secondary | ICD-10-CM

## 2013-07-26 HISTORY — DX: Unspecified hemorrhoids: K64.9

## 2013-07-26 LAB — CBC WITH DIFFERENTIAL/PLATELET
BASO%: 0.8 % (ref 0.0–2.0)
Basophils Absolute: 0 10*3/uL (ref 0.0–0.1)
EOS ABS: 0.3 10*3/uL (ref 0.0–0.5)
EOS%: 4.2 % (ref 0.0–7.0)
HCT: 35 % (ref 34.8–46.6)
HGB: 11.9 g/dL (ref 11.6–15.9)
LYMPH#: 1.5 10*3/uL (ref 0.9–3.3)
LYMPH%: 25.6 % (ref 14.0–49.7)
MCH: 29.6 pg (ref 25.1–34.0)
MCHC: 33.9 g/dL (ref 31.5–36.0)
MCV: 87.3 fL (ref 79.5–101.0)
MONO#: 0.4 10*3/uL (ref 0.1–0.9)
MONO%: 7.3 % (ref 0.0–14.0)
NEUT%: 62.1 % (ref 38.4–76.8)
NEUTROS ABS: 3.7 10*3/uL (ref 1.5–6.5)
Platelets: 119 10*3/uL — ABNORMAL LOW (ref 145–400)
RBC: 4.01 10*6/uL (ref 3.70–5.45)
RDW: 14.1 % (ref 11.2–14.5)
WBC: 6 10*3/uL (ref 3.9–10.3)

## 2013-07-26 LAB — COMPREHENSIVE METABOLIC PANEL (CC13)
ALT: 12 U/L (ref 0–55)
ANION GAP: 8 meq/L (ref 3–11)
AST: 16 U/L (ref 5–34)
Albumin: 4.1 g/dL (ref 3.5–5.0)
Alkaline Phosphatase: 134 U/L (ref 40–150)
BUN: 22 mg/dL (ref 7.0–26.0)
CO2: 27 meq/L (ref 22–29)
Calcium: 9.3 mg/dL (ref 8.4–10.4)
Chloride: 92 mEq/L — ABNORMAL LOW (ref 98–109)
Creatinine: 1.3 mg/dL — ABNORMAL HIGH (ref 0.6–1.1)
GLUCOSE: 98 mg/dL (ref 70–140)
POTASSIUM: 3.6 meq/L (ref 3.5–5.1)
SODIUM: 127 meq/L — AB (ref 136–145)
TOTAL PROTEIN: 6.2 g/dL — AB (ref 6.4–8.3)
Total Bilirubin: 0.35 mg/dL (ref 0.20–1.20)

## 2013-07-26 LAB — LACTATE DEHYDROGENASE (CC13): LDH: 167 U/L (ref 125–245)

## 2013-07-26 LAB — URIC ACID (CC13): Uric Acid, Serum: 5.7 mg/dl (ref 2.6–7.4)

## 2013-07-26 MED ORDER — FOLIC ACID 1 MG PO TABS
1.0000 mg | ORAL_TABLET | Freq: Every day | ORAL | Status: DC
Start: 2013-07-26 — End: 2014-02-27

## 2013-07-26 MED ORDER — PRAMOXINE HCL 1 % RE FOAM: 1.0000 "application " | Freq: Three times a day (TID) | RECTAL | Status: DC | PRN

## 2013-07-26 NOTE — Telephone Encounter (Signed)
s.w. pt and confirmed todays appt....pt ok and aware °

## 2013-07-26 NOTE — Telephone Encounter (Signed)
Pt reports bright red bleeding from rectum since Monday.  States was just w/ BMs but now bleeding between having BMs and even "soaked a pad" last night.   Dr. Alvy Bimler informed and instructs for pt to come in to clinic this afternoon for lab and office visit.  Pt verbalized understanding.

## 2013-07-26 NOTE — Assessment & Plan Note (Signed)
This is due to recent diuretic therapy. I recommend holding off HCTZ for 1 week and resume at one tablet every other day next week

## 2013-07-26 NOTE — Telephone Encounter (Signed)
Message copied by Cathlean Cower on Wed Jul 26, 2013  3:43 PM ------      Message from: [facility], [place]: Wed Jul 26, 2013  3:34 PM      Regarding: low sodium       pls let her know that this is due to her diuretics.      I recommend hold off for 1 week and resume at every other day      ----- Message -----         From: [facility]         Sent: 07/26/2013   2:44 PM           To: Heath Lark, MD                   ------

## 2013-07-26 NOTE — Assessment & Plan Note (Signed)
I reassured her that splenomegaly has resolved. Her imaging study is scheduled next month.

## 2013-07-26 NOTE — Assessment & Plan Note (Signed)
This is chronic related to marginal zone lymphoma and slightly worse due to recent consumption from rectal bleeding. No transfusion is needed. I recommend increasing folic acid supplement.

## 2013-07-26 NOTE — Assessment & Plan Note (Signed)
This has recurred and is quite severe. No fissures are seen Digital rectal examination revealed both internal and external hemorrhoids with no active bleeding visualized. I recommend conservative management with gentle cleaning and proctofoam. If bleeding is still persistent next week, I will refer her to see a colorectal surgeon for possible local procedure such as banding of hemorrhoids.

## 2013-07-26 NOTE — Assessment & Plan Note (Signed)
This is dependent edema, improved with recent diuretic therapy. I recommend close observation and HCTZ prn

## 2013-07-26 NOTE — Progress Notes (Signed)
Shannon Barajas OFFICE PROGRESS NOTE  Patient Care Team: Ricard Dillon, MD as PCP - General  SUMMARY OF ONCOLOGIC HISTORY: Oncology History   Non-Hodgkin lymphoma, marginal zone lymphoma   Primary site: Lymphoid Neoplasms   Staging method: AJCC 6th Edition   Clinical: Stage IV signed by Heath Lark, MD on 05/30/2013  8:47 AM   Summary: Stage IV       Non-Hodgkin lymphoma   02/23/2013 Imaging CT scan show significant enlargement of liver and spleen   05/05/2013 Bone Marrow Biopsy Bone marrow aspirate and biopsy confirmed low grade lymphoma, suspect marginal zone lymphoma   05/08/2013 Imaging PET scan show involvement of liver and spleen   06/02/2013 - 06/23/2013 Chemotherapy She was started on weekly rituximab.    INTERVAL HISTORY: Please see below for problem oriented charting. She is seen urgently today due to profuse rectal bleeding from hemorrhoids starting yesterday. Her toilet bowl was soaked with blood and she was bleeding even after bowel movement. She denies straining for stool recently and denies rectal pain. She had chronic hemorrhoidal bleeding even before chemotherapy but never as severe as this. The patient denies any recent signs or symptoms of bleeding such as spontaneous epistaxis, hematuria or post-menopausal bleeding. She has chronic leg edema, improving on recent diuretics. Appetite is fair.  REVIEW OF SYSTEMS:   Constitutional: Denies fevers, chills or abnormal weight loss Eyes: Denies blurriness of vision Ears, nose, mouth, throat, and face: Denies mucositis or sore throat Respiratory: Denies cough, dyspnea or wheezes Cardiovascular: Denies palpitation, chest discomfort  Gastrointestinal:  Denies nausea, heartburn or change in bowel habits Skin: Denies abnormal skin rashes Lymphatics: Denies new lymphadenopathy or easy bruising Neurological:Denies numbness, tingling or new weaknesses Behavioral/Psych: Mood is stable, no new changes  All other systems  were reviewed with the patient and are negative.  I have reviewed the past medical history, past surgical history, social history and family history with the patient and they are unchanged from previous note.  ALLERGIES:  is allergic to biaxin; erythromycin; soy allergy; and zithromax.  MEDICATIONS:  Current Outpatient Prescriptions  Medication Sig Dispense Refill  . estradiol (ESTRACE) 2 MG tablet Take 2 mg by mouth every evening.       . Fe Fum-FePoly-Vit C-Vit B3 (INTEGRA) 62.5-62.5-40-3 MG CAPS Take 1 capsule by mouth daily.  90 capsule  1  . Linaclotide (LINZESS) 290 MCG CAPS capsule Take 1 capsule (290 mcg total) by mouth every morning.  30 capsule  5  . metoCLOPramide (REGLAN) 5 MG tablet TAKE 1 TABLET BY MOUTH 2 (TWO) TIMES DAILY AT 10 AM AND 4 PM.  180 tablet  1  . progesterone (PROMETRIUM) 100 MG capsule 100 mg. For 12 days after Estradiol      . temazepam (RESTORIL) 30 MG capsule Take 2 capsules (60 mg total) by mouth at bedtime.  60 capsule  5  . traZODone (DESYREL) 100 MG tablet Take 100 mg by mouth at bedtime. Takes 1/2      . folic acid (FOLVITE) 1 MG tablet Take 1 tablet (1 mg total) by mouth daily.  30 tablet  6  . hydrochlorothiazide (HYDRODIURIL) 25 MG tablet Take 1 tablet (25 mg total) by mouth every morning.  30 tablet  1  . pramoxine (PROCTOFOAM) 1 % foam Place 1 application rectally 3 (three) times daily as needed for itching.  15 g  0   No current facility-administered medications for this visit.    PHYSICAL EXAMINATION: ECOG PERFORMANCE STATUS: 1 -  Symptomatic but completely ambulatory  Filed Vitals:   07/26/13 1448  BP: 108/68  Pulse: 76  Temp: 97 F (36.1 C)  Resp: 18   Filed Weights   07/26/13 1448  Weight: 144 lb 6.4 oz (65.499 kg)    GENERAL:alert, no distress and comfortable SKIN: skin color, texture, turgor are normal, no rashes or significant lesions EYES: normal, Conjunctiva are pink and non-injected, sclera clear OROPHARYNX:no exudate, no  erythema and lips, buccal mucosa, and tongue normal  NECK: supple, thyroid normal size, non-tender, without nodularity LYMPH:  no palpable lymphadenopathy in the cervical, axillary or inguinal LUNGS: clear to auscultation and percussion with normal breathing effort HEART: regular rate & rhythm and no murmurs and no lower extremity edema ABDOMEN:abdomen soft, non-tender and normal bowel sounds. No palpalesplenomegaly Musculoskeletal:no cyanosis of digits and no clubbing  NEURO: alert & oriented x 3 with fluent speech, no focal motor/sensory deficits Rectal examination: significant internal and external hemorrhoids with no active bleeding. She has severe pain with introduction of the tip of my second digit. No fissures are noted.  LABORATORY DATA:  I have reviewed the data as listed    Component Value Date/Time   NA 127* 07/26/2013 1426   NA 139 05/05/2013 0730   K 3.6 07/26/2013 1426   K 3.9 05/05/2013 0730   CL 101 05/05/2013 0730   CO2 27 07/26/2013 1426   CO2 26 05/05/2013 0730   GLUCOSE 98 07/26/2013 1426   GLUCOSE 83 05/05/2013 0730   BUN 22.0 07/26/2013 1426   BUN 19 05/05/2013 0730   CREATININE 1.3* 07/26/2013 1426   CREATININE 1.51* 05/05/2013 0730   CALCIUM 9.3 07/26/2013 1426   CALCIUM 9.6 05/05/2013 0730   PROT 6.2* 07/26/2013 1426   PROT 6.4 05/05/2013 0730   ALBUMIN 4.1 07/26/2013 1426   ALBUMIN 4.0 05/05/2013 0730   AST 16 07/26/2013 1426   AST 23 05/05/2013 0730   ALT 12 07/26/2013 1426   ALT 17 05/05/2013 0730   ALKPHOS 134 07/26/2013 1426   ALKPHOS 100 05/05/2013 0730   BILITOT 0.35 07/26/2013 1426   BILITOT 0.5 05/05/2013 0730   GFRNONAA 35* 05/05/2013 0730   GFRAA 41* 05/05/2013 0730    No results found for this basename: SPEP, UPEP,  kappa and lambda light chains    Lab Results  Component Value Date   WBC 6.0 07/26/2013   NEUTROABS 3.7 07/26/2013   HGB 11.9 07/26/2013   HCT 35.0 07/26/2013   MCV 87.3 07/26/2013   PLT 119* 07/26/2013      Chemistry      Component Value Date/Time   NA 127*  07/26/2013 1426   NA 139 05/05/2013 0730   K 3.6 07/26/2013 1426   K 3.9 05/05/2013 0730   CL 101 05/05/2013 0730   CO2 27 07/26/2013 1426   CO2 26 05/05/2013 0730   BUN 22.0 07/26/2013 1426   BUN 19 05/05/2013 0730   CREATININE 1.3* 07/26/2013 1426   CREATININE 1.51* 05/05/2013 0730      Component Value Date/Time   CALCIUM 9.3 07/26/2013 1426   CALCIUM 9.6 05/05/2013 0730   ALKPHOS 134 07/26/2013 1426   ALKPHOS 100 05/05/2013 0730   AST 16 07/26/2013 1426   AST 23 05/05/2013 0730   ALT 12 07/26/2013 1426   ALT 17 05/05/2013 0730   BILITOT 0.35 07/26/2013 1426   BILITOT 0.5 05/05/2013 0730     ASSESSMENT & PLAN:  RECTAL BLEEDING This has recurred and is quite severe. No fissures are  seen Digital rectal examination revealed both internal and external hemorrhoids with no active bleeding visualized. I recommend conservative management with gentle cleaning and proctofoam. If bleeding is still persistent next week, I will refer her to see a colorectal surgeon for possible local procedure such as banding of hemorrhoids.  THROMBOCYTOPENIA This is chronic related to marginal zone lymphoma and slightly worse due to recent consumption from rectal bleeding. No transfusion is needed. I recommend increasing folic acid supplement.  Hyponatremia This is due to recent diuretic therapy. I recommend holding off HCTZ for 1 week and resume at one tablet every other day next week  Leg edema This is dependent edema, improved with recent diuretic therapy. I recommend close observation and HCTZ prn  Non-Hodgkin lymphoma I reassured her that splenomegaly has resolved. Her imaging study is scheduled next month.   No orders of the defined types were placed in this encounter.   All questions were answered. The patient knows to call the clinic with any problems, questions or concerns. No barriers to learning was detected. I spent 25 minutes counseling the patient face to face. The total time spent in the appointment was 30  minutes and more than 50% was on counseling and review of test results     [facility], [place], MD 07/26/2013 5:54 PM

## 2013-07-27 ENCOUNTER — Telehealth: Payer: Self-pay | Admitting: *Deleted

## 2013-07-27 NOTE — Telephone Encounter (Signed)
Instructed pt to do sitz bath after each BM or TID.  She verbalized understanding.

## 2013-07-27 NOTE — Telephone Encounter (Signed)
Instructed pt to hold HCTZ for one week and then resume every other day per Dr. Alvy Bimler.  Informed of low sodium level and elevated creatinine.  She verbalized understanding.

## 2013-07-27 NOTE — Telephone Encounter (Signed)
Pt states Proctofoam costs almost $100 (not covered by insurance) and asks if it is "really important" to use or is there anything less expensive she can try?

## 2013-07-27 NOTE — Telephone Encounter (Signed)
No, then just do sitz bath

## 2013-07-28 ENCOUNTER — Other Ambulatory Visit: Payer: 59

## 2013-07-31 ENCOUNTER — Telehealth: Payer: Self-pay | Admitting: *Deleted

## 2013-07-31 ENCOUNTER — Other Ambulatory Visit: Payer: Self-pay | Admitting: Hematology and Oncology

## 2013-07-31 DIAGNOSIS — K649 Unspecified hemorrhoids: Secondary | ICD-10-CM

## 2013-07-31 MED ORDER — HYDROCORTISONE ACETATE 25 MG RE SUPP: 25.0000 mg | Freq: Two times a day (BID) | RECTAL | Status: DC

## 2013-07-31 NOTE — Telephone Encounter (Signed)
Informed pt of Dr. Calton Dach reply below.  She verbalized understanding.

## 2013-07-31 NOTE — Telephone Encounter (Signed)
I have prescribed it.  no shingles vaccination due to lymphoma diagnosis

## 2013-07-31 NOTE — Telephone Encounter (Signed)
Pt asks if Dr. Alvy Bimler will prescribe her Anucort 25 mg suppositories to [place]?  States previous GI had given her once for hemorrhoids.  Reports has had one more episode of rectal bleeding since she saw Dr. Alvy Bimler last week.  She also concerned her daughter has Shingles and asks if she needs to get Shingles vaccine? Pt states has had chicken pox as a child.  Informed pt Shingles vaccine not recommend while going through treatment but to ask Dr. Alvy Bimler about it on next visit.  Informed pt she cannot catch Shingles from her daughter since he has had chicken pox.

## 2013-08-03 ENCOUNTER — Telehealth: Payer: Self-pay | Admitting: Hematology and Oncology

## 2013-08-03 NOTE — Telephone Encounter (Signed)
pt called to r/s appt due to having to work....pt aware of new d.t

## 2013-08-08 ENCOUNTER — Telehealth: Payer: Self-pay | Admitting: Internal Medicine

## 2013-08-08 DIAGNOSIS — C8590 Non-Hodgkin lymphoma, unspecified, unspecified site: Secondary | ICD-10-CM

## 2013-08-08 NOTE — Telephone Encounter (Signed)
[facility] - [place], [place] - [address]. Is requesting 90 day re-fill on temazepam (RESTORIL) 30 MG capsule

## 2013-08-10 MED ORDER — TEMAZEPAM 30 MG PO CAPS: 60.0000 mg | ORAL_CAPSULE | Freq: Every day | ORAL | Status: DC

## 2013-08-10 NOTE — Telephone Encounter (Signed)
Ok x5 per Dr Arnoldo Morale, rx faxed to [facility]

## 2013-08-17 ENCOUNTER — Encounter: Payer: Self-pay | Admitting: Hematology and Oncology

## 2013-08-17 NOTE — Progress Notes (Signed)
[employer] approved rituxan, but her insurance is paying 100%

## 2013-09-04 ENCOUNTER — Ambulatory Visit (HOSPITAL_COMMUNITY)
Admission: RE | Admit: 2013-09-04 | Discharge: 2013-09-04 | Disposition: A | Discharge status: Home / Self Care | Payer: 59 | Source: Ambulatory Visit | Attending: Hematology and Oncology | Admitting: Hematology and Oncology

## 2013-09-04 ENCOUNTER — Encounter (HOSPITAL_COMMUNITY): Payer: Self-pay

## 2013-09-04 DIAGNOSIS — C8590 Non-Hodgkin lymphoma, unspecified, unspecified site: Secondary | ICD-10-CM

## 2013-09-04 DIAGNOSIS — C8589 Other specified types of non-Hodgkin lymphoma, extranodal and solid organ sites: Secondary | ICD-10-CM | POA: Insufficient documentation

## 2013-09-04 LAB — GLUCOSE, CAPILLARY: GLUCOSE-CAPILLARY: 89 mg/dL (ref 70–99)

## 2013-09-04 MED ORDER — FLUDEOXYGLUCOSE F - 18 (FDG) INJECTION
8.5600 | Freq: Once | INTRAVENOUS | Status: AC | PRN
  Administered 2013-09-04: 7.18 via INTRAVENOUS

## 2013-09-05 ENCOUNTER — Encounter: Payer: Self-pay | Admitting: Hematology and Oncology

## 2013-09-05 ENCOUNTER — Ambulatory Visit: Payer: 59 | Admitting: Hematology and Oncology

## 2013-09-05 ENCOUNTER — Ambulatory Visit (HOSPITAL_BASED_OUTPATIENT_CLINIC_OR_DEPARTMENT_OTHER): Payer: 59 | Admitting: Hematology and Oncology

## 2013-09-05 ENCOUNTER — Other Ambulatory Visit (HOSPITAL_BASED_OUTPATIENT_CLINIC_OR_DEPARTMENT_OTHER): Payer: 59

## 2013-09-05 ENCOUNTER — Other Ambulatory Visit: Payer: 59

## 2013-09-05 VITALS — BP 101/56 | HR 74 | Temp 97.7°F | Resp 18 | Ht 67.0 in | Wt 144.2 lb

## 2013-09-05 DIAGNOSIS — C8589 Other specified types of non-Hodgkin lymphoma, extranodal and solid organ sites: Secondary | ICD-10-CM

## 2013-09-05 DIAGNOSIS — R161 Splenomegaly, not elsewhere classified: Secondary | ICD-10-CM

## 2013-09-05 DIAGNOSIS — N183 Chronic kidney disease, stage 3 unspecified: Secondary | ICD-10-CM | POA: Insufficient documentation

## 2013-09-05 DIAGNOSIS — N189 Chronic kidney disease, unspecified: Secondary | ICD-10-CM

## 2013-09-05 DIAGNOSIS — D696 Thrombocytopenia, unspecified: Secondary | ICD-10-CM

## 2013-09-05 DIAGNOSIS — C8590 Non-Hodgkin lymphoma, unspecified, unspecified site: Secondary | ICD-10-CM

## 2013-09-05 DIAGNOSIS — N181 Chronic kidney disease, stage 1: Secondary | ICD-10-CM

## 2013-09-05 LAB — CBC WITH DIFFERENTIAL/PLATELET
BASO%: 0.8 % (ref 0.0–2.0)
Basophils Absolute: 0 10*3/uL (ref 0.0–0.1)
EOS%: 1.5 % (ref 0.0–7.0)
Eosinophils Absolute: 0.1 10*3/uL (ref 0.0–0.5)
HEMATOCRIT: 35 % (ref 34.8–46.6)
HEMOGLOBIN: 11.6 g/dL (ref 11.6–15.9)
LYMPH#: 1.6 10*3/uL (ref 0.9–3.3)
LYMPH%: 27.9 % (ref 14.0–49.7)
MCH: 29.7 pg (ref 25.1–34.0)
MCHC: 33 g/dL (ref 31.5–36.0)
MCV: 89.8 fL (ref 79.5–101.0)
MONO#: 0.4 10*3/uL (ref 0.1–0.9)
MONO%: 6.7 % (ref 0.0–14.0)
NEUT#: 3.5 10*3/uL (ref 1.5–6.5)
NEUT%: 63.1 % (ref 38.4–76.8)
Platelets: 123 10*3/uL — ABNORMAL LOW (ref 145–400)
RBC: 3.89 10*6/uL (ref 3.70–5.45)
RDW: 12.6 % (ref 11.2–14.5)
WBC: 5.6 10*3/uL (ref 3.9–10.3)

## 2013-09-05 LAB — COMPREHENSIVE METABOLIC PANEL (CC13)
ALT: 9 U/L (ref 0–55)
ANION GAP: 8 meq/L (ref 3–11)
AST: 17 U/L (ref 5–34)
Albumin: 4.1 g/dL (ref 3.5–5.0)
Alkaline Phosphatase: 102 U/L (ref 40–150)
BUN: 20.1 mg/dL (ref 7.0–26.0)
CO2: 29 meq/L (ref 22–29)
CREATININE: 1.4 mg/dL — AB (ref 0.6–1.1)
Calcium: 9.6 mg/dL (ref 8.4–10.4)
Chloride: 104 mEq/L (ref 98–109)
Glucose: 77 mg/dl (ref 70–140)
Potassium: 3.6 mEq/L (ref 3.5–5.1)
SODIUM: 141 meq/L (ref 136–145)
TOTAL PROTEIN: 6.2 g/dL — AB (ref 6.4–8.3)
Total Bilirubin: 0.25 mg/dL (ref 0.20–1.20)

## 2013-09-05 LAB — LACTATE DEHYDROGENASE (CC13): LDH: 173 U/L (ref 125–245)

## 2013-09-05 LAB — URIC ACID (CC13): Uric Acid, Serum: 5.2 mg/dL (ref 2.6–7.4)

## 2013-09-05 NOTE — Assessment & Plan Note (Signed)
This is mild. Recommend close observation.

## 2013-09-05 NOTE — Assessment & Plan Note (Signed)
The cause is unknown, be related to residual disease or recent treatment. It is mild and there is little change compared from previous platelet count. The patient denies recent history of bleeding such as epistaxis, hematuria or hematochezia. She is asymptomatic from the thrombocytopenia. I will observe for now.

## 2013-09-05 NOTE — Progress Notes (Signed)
[place] OFFICE PROGRESS NOTE  Patient Care Team: Olga Millers, MD as PCP - General (Internal Medicine)  SUMMARY OF ONCOLOGIC HISTORY: Oncology History   Non-Hodgkin lymphoma, marginal zone lymphoma   Primary site: Lymphoid Neoplasms   Staging method: AJCC 6th Edition   Clinical: Stage IV signed by Heath Lark, MD on 05/30/2013  8:47 AM   Summary: Stage IV       Non-Hodgkin lymphoma   02/23/2013 Imaging CT scan show significant enlargement of liver and spleen   05/05/2013 Bone Marrow Biopsy Bone marrow aspirate and biopsy confirmed low grade lymphoma, suspect marginal zone lymphoma   05/08/2013 Imaging PET scan show involvement of liver and spleen   06/02/2013 - 06/23/2013 Chemotherapy She was started on weekly rituximab.   09/04/2013 Imaging PET CT scan show complete response to treatment with splenic size reduced back to normal limits. She has persistent mild thrombocytopenia.    INTERVAL HISTORY: Please see below for problem oriented charting. She returns today to followup on test results. She denies recurrence of rectal bleeding and she takes laxatives regularly to control her bowel habits. Her prior leg edema has resolved. REVIEW OF SYSTEMS:   Constitutional: Denies fevers, chills or abnormal weight loss Eyes: Denies blurriness of vision Ears, nose, mouth, throat, and face: Denies mucositis or sore throat Respiratory: Denies cough, dyspnea or wheezes Cardiovascular: Denies palpitation, chest discomfort or lower extremity swelling Gastrointestinal:  Denies nausea, heartburn or change in bowel habits Skin: Denies abnormal skin rashes Lymphatics: Denies new lymphadenopathy or easy bruising Neurological:Denies numbness, tingling or new weaknesses Behavioral/Psych: Mood is stable, no new changes  All other systems were reviewed with the patient and are negative.  I have reviewed the past medical history, past surgical history, social history and family history  with the patient and they are unchanged from previous note.  ALLERGIES:  is allergic to biaxin; erythromycin; soy allergy; and zithromax.  MEDICATIONS:  Current Outpatient Prescriptions  Medication Sig Dispense Refill  . estradiol (ESTRACE) 2 MG tablet Take 2 mg by mouth every evening.       . Fe Fum-FePoly-Vit C-Vit B3 (INTEGRA) 62.5-62.5-40-3 MG CAPS Take 1 capsule by mouth daily.  90 capsule  1  . folic acid (FOLVITE) 1 MG tablet Take 1 tablet (1 mg total) by mouth daily.  30 tablet  6  . hydrocortisone (ANUSOL-HC) 25 MG suppository Place 1 suppository (25 mg total) rectally 2 (two) times daily.  12 suppository  0  . Linaclotide (LINZESS) 290 MCG CAPS capsule Take 1 capsule (290 mcg total) by mouth every morning.  30 capsule  5  . metoCLOPramide (REGLAN) 5 MG tablet TAKE 1 TABLET BY MOUTH 2 (TWO) TIMES DAILY AT 10 AM AND 4 PM.  180 tablet  1  . nitrofurantoin, macrocrystal-monohydrate, (MACROBID) 100 MG capsule Take 100 mg by mouth 2 (two) times daily.      . progesterone (PROMETRIUM) 100 MG capsule 100 mg. For 12 days after Estradiol      . temazepam (RESTORIL) 30 MG capsule Take 2 capsules (60 mg total) by mouth at bedtime.  60 capsule  5  . traZODone (DESYREL) 100 MG tablet Take 100 mg by mouth at bedtime. Takes 1/2       No current facility-administered medications for this visit.    PHYSICAL EXAMINATION: ECOG PERFORMANCE STATUS: 0 - Asymptomatic  Filed Vitals:   09/05/13 1247  BP: 101/56  Pulse: 74  Temp: 97.7 F (36.5 C)  Resp: 18  Filed Weights   09/05/13 1247  Weight: 144 lb 3.2 oz (65.409 kg)    GENERAL:alert, no distress and comfortable SKIN: skin color, texture, turgor are normal, no rashes or significant lesions EYES: normal, Conjunctiva are pink and non-injected, sclera clear OROPHARYNX:no exudate, no erythema and lips, buccal mucosa, and tongue normal  Musculoskeletal:no cyanosis of digits and no clubbing  NEURO: alert & oriented x 3 with fluent speech, no  focal motor/sensory deficits  LABORATORY DATA:  I have reviewed the data as listed    Component Value Date/Time   NA 141 09/05/2013 1229   NA 139 05/05/2013 0730   K 3.6 09/05/2013 1229   K 3.9 05/05/2013 0730   CL 101 05/05/2013 0730   CO2 29 09/05/2013 1229   CO2 26 05/05/2013 0730   GLUCOSE 77 09/05/2013 1229   GLUCOSE 83 05/05/2013 0730   BUN 20.1 09/05/2013 1229   BUN 19 05/05/2013 0730   CREATININE 1.4* 09/05/2013 1229   CREATININE 1.51* 05/05/2013 0730   CALCIUM 9.6 09/05/2013 1229   CALCIUM 9.6 05/05/2013 0730   PROT 6.2* 09/05/2013 1229   PROT 6.4 05/05/2013 0730   ALBUMIN 4.1 09/05/2013 1229   ALBUMIN 4.0 05/05/2013 0730   AST 17 09/05/2013 1229   AST 23 05/05/2013 0730   ALT 9 09/05/2013 1229   ALT 17 05/05/2013 0730   ALKPHOS 102 09/05/2013 1229   ALKPHOS 100 05/05/2013 0730   BILITOT 0.25 09/05/2013 1229   BILITOT 0.5 05/05/2013 0730   GFRNONAA 35* 05/05/2013 0730   GFRAA 41* 05/05/2013 0730    No results found for this basename: SPEP, UPEP,  kappa and lambda light chains    Lab Results  Component Value Date   WBC 5.6 09/05/2013   NEUTROABS 3.5 09/05/2013   HGB 11.6 09/05/2013   HCT 35.0 09/05/2013   MCV 89.8 09/05/2013   PLT 123* 09/05/2013      Chemistry      Component Value Date/Time   NA 141 09/05/2013 1229   NA 139 05/05/2013 0730   K 3.6 09/05/2013 1229   K 3.9 05/05/2013 0730   CL 101 05/05/2013 0730   CO2 29 09/05/2013 1229   CO2 26 05/05/2013 0730   BUN 20.1 09/05/2013 1229   BUN 19 05/05/2013 0730   CREATININE 1.4* 09/05/2013 1229   CREATININE 1.51* 05/05/2013 0730      Component Value Date/Time   CALCIUM 9.6 09/05/2013 1229   CALCIUM 9.6 05/05/2013 0730   ALKPHOS 102 09/05/2013 1229   ALKPHOS 100 05/05/2013 0730   AST 17 09/05/2013 1229   AST 23 05/05/2013 0730   ALT 9 09/05/2013 1229   ALT 17 05/05/2013 0730   BILITOT 0.25 09/05/2013 1229   BILITOT 0.5 05/05/2013 0730       RADIOGRAPHIC STUDIES: Reviewed the PET/CT scan with her and husband I have personally reviewed  the radiological images as listed and agreed with the findings in the report. Nm Pet Image Restag (ps) Skull Base To Thigh  09/04/2013   CLINICAL DATA:  Subsequent treatment strategy for non-Hodgkin's lymphoma.  EXAM: NUCLEAR MEDICINE PET SKULL BASE TO THIGH  TECHNIQUE: 7.2 mCi F-18 FDG was injected intravenously. Full-ring PET imaging was performed from the skull base to thigh after the radiotracer. CT data was obtained and used for attenuation correction and anatomic localization.  FASTING BLOOD GLUCOSE:  Value: 89 A at the mg/dl  COMPARISON:  None.  FINDINGS: NECK  No hypermetabolic lymph nodes in the neck.  CHEST  No hypermetabolic mediastinal or hilar nodes. No suspicious pulmonary nodules on the CT scan.  Third is a focal area of FDG accumulation anterior to the right glenohumeral joint. There is no underlying soft tissue mass or lesion. This uptake is indeterminate and may be related to previous injury.  ABDOMEN/PELVIS  No abnormal hypermetabolic activity within the liver, pancreas, adrenal glands, or spleen. No hypermetabolic lymph nodes in the abdomen or pelvis.  The liver measures 19.6 cm in cranial caudal length. The spleen is normal in size measuring 9 cm in craniocaudal length which is decreased from 16 cm in craniocaudal dimension on the previous PET-CT.  SKELETON  No focal hypermetabolic activity to suggest skeletal metastasis.  IMPRESSION: Resolution of splenomegaly.  No evidence for hypermetabolic lymphadenopathy in the neck, chest, abdomen, or pelvis. No abnormal marrow uptake on today's study.  Indeterminate uptake identified in the region of the right shoulder. This could potentially be related to trauma or inflammation.   Electronically Signed   By: Misty Stanley M.D.   On: 09/04/2013 11:09     ASSESSMENT & PLAN:  Non-Hodgkin lymphoma She has excellent response to treatment. She has very mild thrombocytopenia which could still be related to possible residual disease. However, it is  improving and conceivably might improve back to normal limits in the near future. She is not symptomatic. I discussed with her and her husband the role of maintenance rituximab. They would like to think about it and will call me next week after their appointment at [facility].  THROMBOCYTOPENIA The cause is unknown, be related to residual disease or recent treatment. It is mild and there is little change compared from previous platelet count. The patient denies recent history of bleeding such as epistaxis, hematuria or hematochezia. She is asymptomatic from the thrombocytopenia. I will observe for now.   Chronic kidney disease This is mild. Recommend close observation.     No orders of the defined types were placed in this encounter.   All questions were answered. The patient knows to call the clinic with any problems, questions or concerns. No barriers to learning was detected. I spent 30 minutes counseling the patient face to face. The total time spent in the appointment was 40 minutes and more than 50% was on counseling and review of test results     [facility], [place], MD 09/05/2013 7:22 PM

## 2013-09-05 NOTE — Assessment & Plan Note (Signed)
She has excellent response to treatment. She has very mild thrombocytopenia which could still be related to possible residual disease. However, it is improving and conceivably might improve back to normal limits in the near future. She is not symptomatic. I discussed with her and her husband the role of maintenance rituximab. They would like to think about it and will call me next week after their appointment at [facility].

## 2013-09-06 LAB — IGG, IGA, IGM
IgA: 50 mg/dL — ABNORMAL LOW (ref 69–380)
IgG (Immunoglobin G), Serum: 492 mg/dL — ABNORMAL LOW (ref 690–1700)
IgM, Serum: 22 mg/dL — ABNORMAL LOW (ref 52–322)

## 2013-09-14 ENCOUNTER — Telehealth: Payer: Self-pay | Admitting: *Deleted

## 2013-09-14 ENCOUNTER — Other Ambulatory Visit: Payer: Self-pay | Admitting: Hematology and Oncology

## 2013-09-14 DIAGNOSIS — C8590 Non-Hodgkin lymphoma, unspecified, unspecified site: Secondary | ICD-10-CM

## 2013-09-14 NOTE — Telephone Encounter (Signed)
Pt saw MD at [facility].  Pt says he agreed that pt should go on "maintenance treatment."  He recommended for pt to wait 6 months to start treatment.  Pt says this would be November and asks if she can be scheduled to start the last week of October?  She says she thinks she is supposed to get Rituxan weekly x 4 every 6 months for 2 years?

## 2013-09-14 NOTE — Telephone Encounter (Signed)
i have ordered labs, see me and chemo infusion on 10/27

## 2013-09-15 ENCOUNTER — Telehealth: Payer: Self-pay | Admitting: *Deleted

## 2013-09-15 ENCOUNTER — Telehealth: Payer: Self-pay | Admitting: Hematology and Oncology

## 2013-09-15 NOTE — Telephone Encounter (Signed)
Patient called regarding her appts. I have given the dates and times

## 2013-09-15 NOTE — Telephone Encounter (Signed)
Pt states wants to start Treatments on Fridays.  Ok to change schedule to start on Friday 10/30 instead of Tuesday 10/27?

## 2013-09-15 NOTE — Telephone Encounter (Signed)
lvm for pt regarding to OCT adn NOV appt....mailed pt appt sched/avs and letter.

## 2013-09-15 NOTE — Telephone Encounter (Signed)
Yes, 830 to see me

## 2013-09-15 NOTE — Telephone Encounter (Signed)
, °

## 2013-11-07 ENCOUNTER — Ambulatory Visit: Payer: 59 | Admitting: Internal Medicine

## 2013-11-07 DIAGNOSIS — Z0289 Encounter for other administrative examinations: Secondary | ICD-10-CM

## 2013-11-14 ENCOUNTER — Ambulatory Visit: Payer: 59 | Admitting: Hematology and Oncology

## 2013-11-14 ENCOUNTER — Other Ambulatory Visit: Payer: 59

## 2013-11-14 DIAGNOSIS — C8307 Small cell B-cell lymphoma, spleen: Secondary | ICD-10-CM

## 2013-11-14 HISTORY — DX: Small cell b-cell lymphoma, spleen: C83.07

## 2013-11-16 ENCOUNTER — Telehealth: Payer: Self-pay | Admitting: *Deleted

## 2013-11-16 NOTE — Telephone Encounter (Signed)
Pt says she is feeling better and will come in as scheduled.  She was worried about getting others sick.  Instructed on good hand washing and ok to come in if she is up for it. She verbalized understanding.

## 2013-11-16 NOTE — Telephone Encounter (Signed)
Pt recovering from a cold.  She is doing better but still somewhat congested.  She did not have any fevers.  Her husband recently had the same.  She asks if she should keep her appts tomorrow as scheduled for Lab/MD/Rituxan?

## 2013-11-16 NOTE — Telephone Encounter (Signed)
Depends on how she feels, there is not an absolute C/I and I am willing to give it if she is up for it

## 2013-11-17 ENCOUNTER — Telehealth: Payer: Self-pay | Admitting: Hematology and Oncology

## 2013-11-17 ENCOUNTER — Other Ambulatory Visit (HOSPITAL_BASED_OUTPATIENT_CLINIC_OR_DEPARTMENT_OTHER): Payer: 59

## 2013-11-17 ENCOUNTER — Ambulatory Visit (HOSPITAL_BASED_OUTPATIENT_CLINIC_OR_DEPARTMENT_OTHER): Payer: 59 | Admitting: Hematology and Oncology

## 2013-11-17 ENCOUNTER — Ambulatory Visit (HOSPITAL_BASED_OUTPATIENT_CLINIC_OR_DEPARTMENT_OTHER): Payer: 59

## 2013-11-17 VITALS — BP 117/65 | HR 88 | Temp 97.5°F | Resp 18 | Ht 67.0 in | Wt 148.6 lb

## 2013-11-17 VITALS — BP 124/70 | HR 82 | Temp 97.7°F | Resp 18

## 2013-11-17 DIAGNOSIS — R609 Edema, unspecified: Secondary | ICD-10-CM

## 2013-11-17 DIAGNOSIS — D696 Thrombocytopenia, unspecified: Secondary | ICD-10-CM

## 2013-11-17 DIAGNOSIS — N182 Chronic kidney disease, stage 2 (mild): Secondary | ICD-10-CM

## 2013-11-17 DIAGNOSIS — C8387 Other non-follicular lymphoma, spleen: Secondary | ICD-10-CM

## 2013-11-17 DIAGNOSIS — C8590 Non-Hodgkin lymphoma, unspecified, unspecified site: Secondary | ICD-10-CM

## 2013-11-17 DIAGNOSIS — N189 Chronic kidney disease, unspecified: Secondary | ICD-10-CM

## 2013-11-17 DIAGNOSIS — R161 Splenomegaly, not elsewhere classified: Secondary | ICD-10-CM

## 2013-11-17 DIAGNOSIS — R1011 Right upper quadrant pain: Secondary | ICD-10-CM

## 2013-11-17 DIAGNOSIS — Z5112 Encounter for antineoplastic immunotherapy: Secondary | ICD-10-CM

## 2013-11-17 DIAGNOSIS — K648 Other hemorrhoids: Secondary | ICD-10-CM

## 2013-11-17 LAB — HEPATITIS B CORE ANTIBODY, IGM: Hep B C IgM: NONREACTIVE

## 2013-11-17 LAB — COMPREHENSIVE METABOLIC PANEL (CC13)
ALK PHOS: 123 U/L (ref 40–150)
ALT: 14 U/L (ref 0–55)
ANION GAP: 10 meq/L (ref 3–11)
AST: 18 U/L (ref 5–34)
Albumin: 4.1 g/dL (ref 3.5–5.0)
BILIRUBIN TOTAL: 0.41 mg/dL (ref 0.20–1.20)
BUN: 19.8 mg/dL (ref 7.0–26.0)
CO2: 24 mEq/L (ref 22–29)
Calcium: 9.8 mg/dL (ref 8.4–10.4)
Chloride: 104 mEq/L (ref 98–109)
Creatinine: 1.4 mg/dL — ABNORMAL HIGH (ref 0.6–1.1)
GLUCOSE: 81 mg/dL (ref 70–140)
Potassium: 4.1 mEq/L (ref 3.5–5.1)
Sodium: 139 mEq/L (ref 136–145)
Total Protein: 6.3 g/dL — ABNORMAL LOW (ref 6.4–8.3)

## 2013-11-17 LAB — CBC WITH DIFFERENTIAL/PLATELET
BASO%: 0.6 % (ref 0.0–2.0)
BASOS ABS: 0 10*3/uL (ref 0.0–0.1)
EOS ABS: 0.1 10*3/uL (ref 0.0–0.5)
EOS%: 1.3 % (ref 0.0–7.0)
HCT: 38.2 % (ref 34.8–46.6)
HEMOGLOBIN: 12.7 g/dL (ref 11.6–15.9)
LYMPH%: 23.1 % (ref 14.0–49.7)
MCH: 29 pg (ref 25.1–34.0)
MCHC: 33.2 g/dL (ref 31.5–36.0)
MCV: 87.2 fL (ref 79.5–101.0)
MONO#: 0.4 10*3/uL (ref 0.1–0.9)
MONO%: 6.2 % (ref 0.0–14.0)
NEUT%: 68.8 % (ref 38.4–76.8)
NEUTROS ABS: 4.4 10*3/uL (ref 1.5–6.5)
PLATELETS: 125 10*3/uL — AB (ref 145–400)
RBC: 4.38 10*6/uL (ref 3.70–5.45)
RDW: 12.8 % (ref 11.2–14.5)
WBC: 6.4 10*3/uL (ref 3.9–10.3)
lymph#: 1.5 10*3/uL (ref 0.9–3.3)

## 2013-11-17 LAB — LACTATE DEHYDROGENASE (CC13): LDH: 186 U/L (ref 125–245)

## 2013-11-17 LAB — URIC ACID (CC13): Uric Acid, Serum: 5.6 mg/dl (ref 2.6–7.4)

## 2013-11-17 LAB — HEPATITIS B SURFACE ANTIGEN: HEP B S AG: NEGATIVE

## 2013-11-17 LAB — HEPATITIS B SURFACE ANTIBODY,QUALITATIVE: Hep B S Ab: NEGATIVE

## 2013-11-17 MED ORDER — SODIUM CHLORIDE 0.9 % IV SOLN
Freq: Once | INTRAVENOUS | Status: AC
  Administered 2013-11-17: 09:00:00 via INTRAVENOUS

## 2013-11-17 MED ORDER — DEXAMETHASONE SODIUM PHOSPHATE 20 MG/5ML IJ SOLN
20.0000 mg | Freq: Once | INTRAMUSCULAR | Status: AC
  Administered 2013-11-17: 20 mg via INTRAVENOUS

## 2013-11-17 MED ORDER — PROMETHAZINE HCL 25 MG/ML IJ SOLN
25.0000 mg | Freq: Once | INTRAMUSCULAR | Status: AC
  Administered 2013-11-17: 25 mg via INTRAVENOUS
  Filled 2013-11-17: qty 1

## 2013-11-17 MED ORDER — DIPHENHYDRAMINE HCL 25 MG PO CAPS
ORAL_CAPSULE | ORAL | Status: AC
  Filled 2013-11-17: qty 2

## 2013-11-17 MED ORDER — ACETAMINOPHEN 325 MG PO TABS
650.0000 mg | ORAL_TABLET | Freq: Once | ORAL | Status: AC
  Administered 2013-11-17: 650 mg via ORAL

## 2013-11-17 MED ORDER — ACETAMINOPHEN 325 MG PO TABS
ORAL_TABLET | ORAL | Status: AC
  Filled 2013-11-17: qty 2

## 2013-11-17 MED ORDER — DEXAMETHASONE SODIUM PHOSPHATE 20 MG/5ML IJ SOLN
INTRAMUSCULAR | Status: AC
  Filled 2013-11-17: qty 5

## 2013-11-17 MED ORDER — HYDROCORTISONE ACETATE 25 MG RE SUPP: 25.0000 mg | Freq: Two times a day (BID) | RECTAL | Status: DC

## 2013-11-17 MED ORDER — DIPHENHYDRAMINE HCL 25 MG PO CAPS
50.0000 mg | ORAL_CAPSULE | Freq: Once | ORAL | Status: AC
  Administered 2013-11-17: 50 mg via ORAL

## 2013-11-17 MED ORDER — SODIUM CHLORIDE 0.9 % IV SOLN
375.0000 mg/m2 | Freq: Once | INTRAVENOUS | Status: AC
  Administered 2013-11-17: 700 mg via INTRAVENOUS
  Filled 2013-11-17: qty 70

## 2013-11-17 NOTE — Patient Instructions (Signed)
[facility] Discharge Instructions for Patients Receiving Chemotherapy  Today you received the following chemotherapy agents Rituxan.  To help prevent nausea and vomiting after your treatment, we encourage you to take your nausea medication as prescribed.   If you develop nausea and vomiting that is not controlled by your nausea medication, call the clinic.   BELOW ARE SYMPTOMS THAT SHOULD BE REPORTED IMMEDIATELY:  *FEVER GREATER THAN 100.5 F  *CHILLS WITH OR WITHOUT FEVER  NAUSEA AND VOMITING THAT IS NOT CONTROLLED WITH YOUR NAUSEA MEDICATION  *UNUSUAL SHORTNESS OF BREATH  *UNUSUAL BRUISING OR BLEEDING  TENDERNESS IN MOUTH AND THROAT WITH OR WITHOUT PRESENCE OF ULCERS  *URINARY PROBLEMS  *BOWEL PROBLEMS  UNUSUAL RASH Items with * indicate a potential emergency and should be followed up as soon as possible.  Feel free to call the clinic you have any questions or concerns. The clinic phone number is [phone].    

## 2013-11-17 NOTE — Assessment & Plan Note (Signed)
This is mild. Recommend close observation.

## 2013-11-17 NOTE — Progress Notes (Signed)
[place] OFFICE PROGRESS NOTE  Patient Care Team: Olga Millers, MD as PCP - General (Internal Medicine)  SUMMARY OF ONCOLOGIC HISTORY: Oncology History   Non-Hodgkin lymphoma, marginal zone lymphoma   Primary site: Lymphoid Neoplasms   Staging method: AJCC 6th Edition   Clinical: Stage IV signed by Heath Lark, MD on 05/30/2013  8:47 AM   Summary: Stage IV       Non-Hodgkin lymphoma   02/23/2013 Imaging CT scan show significant enlargement of liver and spleen   05/05/2013 Bone Marrow Biopsy Bone marrow aspirate and biopsy confirmed low grade lymphoma, suspect marginal zone lymphoma   05/08/2013 Imaging PET scan show involvement of liver and spleen   06/02/2013 - 06/23/2013 Chemotherapy She was started on weekly rituximab.   09/04/2013 Imaging PET CT scan show complete response to treatment with splenic size reduced back to normal limits. She has persistent mild thrombocytopenia.    INTERVAL HISTORY: Please see below for problem oriented charting. She returns today for follow-up visit before maintenance rituximab. She complained of intermittent hemorrhoidal bleeding. She also complain of right upper quadrant discomfort on further which is new since the last time I saw her, similar to prior presentation before we started treatment for lymphoma.  REVIEW OF SYSTEMS:   Constitutional: Denies fevers, chills or abnormal weight loss Eyes: Denies blurriness of vision Ears, nose, mouth, throat, and face: Denies mucositis or sore throat Respiratory: Denies cough, dyspnea or wheezes Cardiovascular: Denies palpitation, chest discomfort or lower extremity swelling Gastrointestinal:  Denies nausea, heartburn or change in bowel habits Skin: Denies abnormal skin rashes Lymphatics: Denies new lymphadenopathy or easy bruising Neurological:Denies numbness, tingling or new weaknesses Behavioral/Psych: Mood is stable, no new changes  All other systems were reviewed with the patient  and are negative.  I have reviewed the past medical history, past surgical history, social history and family history with the patient and they are unchanged from previous note.  ALLERGIES:  is allergic to biaxin; erythromycin; soy allergy; and zithromax.  MEDICATIONS:  Current Outpatient Prescriptions  Medication Sig Dispense Refill  . estradiol (ESTRACE) 2 MG tablet Take 2 mg by mouth every evening.       . Fe Fum-FePoly-Vit C-Vit B3 (INTEGRA) 62.5-62.5-40-3 MG CAPS Take 1 capsule by mouth daily.  90 capsule  1  . folic acid (FOLVITE) 1 MG tablet Take 1 tablet (1 mg total) by mouth daily.  30 tablet  6  . hydrocortisone (ANUSOL-HC) 25 MG suppository Place 1 suppository (25 mg total) rectally 2 (two) times daily.  24 suppository  6  . Linaclotide (LINZESS) 290 MCG CAPS capsule Take 1 capsule (290 mcg total) by mouth every morning.  30 capsule  5  . metoCLOPramide (REGLAN) 5 MG tablet TAKE 1 TABLET BY MOUTH 2 (TWO) TIMES DAILY AT 10 AM AND 4 PM.  180 tablet  1  . progesterone (PROMETRIUM) 100 MG capsule 100 mg. For 12 days after Estradiol      . temazepam (RESTORIL) 30 MG capsule Take 2 capsules (60 mg total) by mouth at bedtime.  60 capsule  5  . traZODone (DESYREL) 100 MG tablet Take 100 mg by mouth at bedtime. Takes 1/2       No current facility-administered medications for this visit.   Facility-Administered Medications Ordered in Other Visits  Medication Dose Route Frequency Provider Last Rate Last Dose  . riTUXimab (RITUXAN) 700 mg in sodium chloride 0.9 % 180 mL chemo infusion  375 mg/m2 (Treatment Plan Actual) Intravenous  Once Heath Lark, MD        PHYSICAL EXAMINATION: ECOG PERFORMANCE STATUS: 1 - Symptomatic but completely ambulatory  Filed Vitals:   11/17/13 0834  BP: 117/65  Pulse: 88  Temp: 97.5 F (36.4 C)  Resp: 18   Filed Weights   11/17/13 0834  Weight: 148 lb 9.6 oz (67.405 kg)    GENERAL:alert, no distress and comfortable SKIN: skin color, texture, turgor  are normal, no rashes or significant lesions EYES: normal, Conjunctiva are pink and non-injected, sclera clear OROPHARYNX:no exudate, no erythema and lips, buccal mucosa, and tongue normal  NECK: supple, thyroid normal size, non-tender, without nodularity LYMPH:  no palpable lymphadenopathy in the cervical, axillary or inguinal LUNGS: clear to auscultation and percussion with normal breathing effort HEART: regular rate & rhythm and no murmurs and no lower extremity edema ABDOMEN:abdomen soft, non-tender and normal bowel sounds. No palpable hepatosplenomegaly Musculoskeletal:no cyanosis of digits and no clubbing  NEURO: alert & oriented x 3 with fluent speech, no focal motor/sensory deficits  LABORATORY DATA:  I have reviewed the data as listed    Component Value Date/Time   NA 139 11/17/2013 0814   NA 139 05/05/2013 0730   K 4.1 11/17/2013 0814   K 3.9 05/05/2013 0730   CL 101 05/05/2013 0730   CO2 24 11/17/2013 0814   CO2 26 05/05/2013 0730   GLUCOSE 81 11/17/2013 0814   GLUCOSE 83 05/05/2013 0730   BUN 19.8 11/17/2013 0814   BUN 19 05/05/2013 0730   CREATININE 1.4* 11/17/2013 0814   CREATININE 1.51* 05/05/2013 0730   CALCIUM 9.8 11/17/2013 0814   CALCIUM 9.6 05/05/2013 0730   PROT 6.3* 11/17/2013 0814   PROT 6.4 05/05/2013 0730   ALBUMIN 4.1 11/17/2013 0814   ALBUMIN 4.0 05/05/2013 0730   AST 18 11/17/2013 0814   AST 23 05/05/2013 0730   ALT 14 11/17/2013 0814   ALT 17 05/05/2013 0730   ALKPHOS 123 11/17/2013 0814   ALKPHOS 100 05/05/2013 0730   BILITOT 0.41 11/17/2013 0814   BILITOT 0.5 05/05/2013 0730   GFRNONAA 35* 05/05/2013 0730   GFRAA 41* 05/05/2013 0730    No results found for this basename: SPEP, UPEP,  kappa and lambda light chains    Lab Results  Component Value Date   WBC 6.4 11/17/2013   NEUTROABS 4.4 11/17/2013   HGB 12.7 11/17/2013   HCT 38.2 11/17/2013   MCV 87.2 11/17/2013   PLT 125* 11/17/2013      Chemistry      Component Value Date/Time   NA 139  11/17/2013 0814   NA 139 05/05/2013 0730   K 4.1 11/17/2013 0814   K 3.9 05/05/2013 0730   CL 101 05/05/2013 0730   CO2 24 11/17/2013 0814   CO2 26 05/05/2013 0730   BUN 19.8 11/17/2013 0814   BUN 19 05/05/2013 0730   CREATININE 1.4* 11/17/2013 0814   CREATININE 1.51* 05/05/2013 0730      Component Value Date/Time   CALCIUM 9.8 11/17/2013 0814   CALCIUM 9.6 05/05/2013 0730   ALKPHOS 123 11/17/2013 0814   ALKPHOS 100 05/05/2013 0730   AST 18 11/17/2013 0814   AST 23 05/05/2013 0730   ALT 14 11/17/2013 0814   ALT 17 05/05/2013 0730   BILITOT 0.41 11/17/2013 0814   BILITOT 0.5 05/05/2013 0730     ASSESSMENT & PLAN:  Non-Hodgkin lymphoma Clinically, she has no signs of recurrence. However, the patient felt that she started have recurrence of right upper  quadrant pain similar to prior presentation before treatment. We'll proceed to order a PET/CT scan but not wait for results. I will go ahead and give her rituximab treatment weekly 4 and see her back in the next few weeks to review results of the PET/CT scan.  Chronic kidney disease This is mild. Recommend close observation.    Thrombocytopenia The cause is unknown, be related to residual disease or recent treatment. It is mild and there is little change compared from previous platelet count. The patient denies recent history of bleeding such as epistaxis, hematuria or hematochezia. She is asymptomatic from the thrombocytopenia. I will observe for now.     Hemorrhoids The patient have intermittent bleeding from hemorrhoids. She wants to be managed conservatively and does not want referral to GI or Gen. surgery. I refilled her prescription Anusol.   Orders Placed This Encounter  Procedures  . NM PET Image Restag (PS) Skull Base To Thigh    Standing Status: Future     Number of Occurrences:      Standing Expiration Date: 01/17/2015    Order Specific Question:  Reason for Exam (SYMPTOM  OR DIAGNOSIS REQUIRED)    Answer:  staging  lymphoma, discomfort RUQ    Order Specific Question:  Preferred imaging location?    Answer:  [facility]   All questions were answered. The patient knows to call the clinic with any problems, questions or concerns. No barriers to learning was detected. I spent 30 minutes counseling the patient face to face. The total time spent in the appointment was 40 minutes and more than 50% was on counseling and review of test results     [facility], [place], MD 11/17/2013 10:10 AM

## 2013-11-17 NOTE — Assessment & Plan Note (Signed)
The patient have intermittent bleeding from hemorrhoids. She wants to be managed conservatively and does not want referral to GI or Gen. surgery. I refilled her prescription Anusol.

## 2013-11-17 NOTE — Telephone Encounter (Signed)
Gave AVS & Cal for Nov. °

## 2013-11-17 NOTE — Assessment & Plan Note (Signed)
Clinically, she has no signs of recurrence. However, the patient felt that she started have recurrence of right upper quadrant pain similar to prior presentation before treatment. We'll proceed to order a PET/CT scan but not wait for results. I will go ahead and give her rituximab treatment weekly 4 and see her back in the next few weeks to review results of the PET/CT scan.

## 2013-11-17 NOTE — Assessment & Plan Note (Signed)
The cause is unknown, be related to residual disease or recent treatment. It is mild and there is little change compared from previous platelet count. The patient denies recent history of bleeding such as epistaxis, hematuria or hematochezia. She is asymptomatic from the thrombocytopenia. I will observe for now.

## 2013-11-24 ENCOUNTER — Ambulatory Visit: Payer: 59 | Admitting: Internal Medicine

## 2013-11-24 ENCOUNTER — Ambulatory Visit (HOSPITAL_BASED_OUTPATIENT_CLINIC_OR_DEPARTMENT_OTHER): Payer: 59

## 2013-11-24 DIAGNOSIS — Z5112 Encounter for antineoplastic immunotherapy: Secondary | ICD-10-CM

## 2013-11-24 DIAGNOSIS — C8590 Non-Hodgkin lymphoma, unspecified, unspecified site: Secondary | ICD-10-CM

## 2013-11-24 DIAGNOSIS — C8387 Other non-follicular lymphoma, spleen: Secondary | ICD-10-CM

## 2013-11-24 MED ORDER — SODIUM CHLORIDE 0.9 % IV SOLN
375.0000 mg/m2 | Freq: Once | INTRAVENOUS | Status: AC
  Administered 2013-11-24: 700 mg via INTRAVENOUS
  Filled 2013-11-24: qty 70

## 2013-11-24 MED ORDER — SODIUM CHLORIDE 0.9 % IV SOLN
Freq: Once | INTRAVENOUS | Status: AC
  Administered 2013-11-24: 09:00:00 via INTRAVENOUS

## 2013-11-24 MED ORDER — DEXAMETHASONE SODIUM PHOSPHATE 20 MG/5ML IJ SOLN
INTRAMUSCULAR | Status: AC
  Filled 2013-11-24: qty 5

## 2013-11-24 MED ORDER — PROMETHAZINE HCL 25 MG/ML IJ SOLN
25.0000 mg | Freq: Once | INTRAMUSCULAR | Status: AC
  Administered 2013-11-24: 25 mg via INTRAVENOUS
  Filled 2013-11-24: qty 1

## 2013-11-24 MED ORDER — DIPHENHYDRAMINE HCL 25 MG PO CAPS
ORAL_CAPSULE | ORAL | Status: AC
  Filled 2013-11-24: qty 2

## 2013-11-24 MED ORDER — ACETAMINOPHEN 325 MG PO TABS
ORAL_TABLET | ORAL | Status: AC
Start: 2013-11-24 — End: 2013-11-24
  Filled 2013-11-24: qty 2

## 2013-11-24 MED ORDER — ACETAMINOPHEN 325 MG PO TABS
650.0000 mg | ORAL_TABLET | Freq: Once | ORAL | Status: AC
  Administered 2013-11-24: 650 mg via ORAL

## 2013-11-24 MED ORDER — DEXAMETHASONE SODIUM PHOSPHATE 20 MG/5ML IJ SOLN
20.0000 mg | Freq: Once | INTRAMUSCULAR | Status: AC
  Administered 2013-11-24: 20 mg via INTRAVENOUS

## 2013-11-24 MED ORDER — DIPHENHYDRAMINE HCL 25 MG PO CAPS
50.0000 mg | ORAL_CAPSULE | Freq: Once | ORAL | Status: AC
  Administered 2013-11-24: 50 mg via ORAL

## 2013-11-24 NOTE — Patient Instructions (Signed)
[facility] Discharge Instructions for Patients Receiving Chemotherapy  Today you received the following chemotherapy agents rituxan.   To help prevent nausea and vomiting after your treatment, we encourage you to take your nausea medication as directed.     If you develop nausea and vomiting that is not controlled by your nausea medication, call the clinic.   BELOW ARE SYMPTOMS THAT SHOULD BE REPORTED IMMEDIATELY:  *FEVER GREATER THAN 100.5 F  *CHILLS WITH OR WITHOUT FEVER  NAUSEA AND VOMITING THAT IS NOT CONTROLLED WITH YOUR NAUSEA MEDICATION  *UNUSUAL SHORTNESS OF BREATH  *UNUSUAL BRUISING OR BLEEDING  TENDERNESS IN MOUTH AND THROAT WITH OR WITHOUT PRESENCE OF ULCERS  *URINARY PROBLEMS  *BOWEL PROBLEMS  UNUSUAL RASH Items with * indicate a potential emergency and should be followed up as soon as possible.  Feel free to call the clinic you have any questions or concerns. The clinic phone number is [phone].  

## 2013-11-29 ENCOUNTER — Other Ambulatory Visit: Payer: Self-pay | Admitting: Hematology and Oncology

## 2013-12-01 ENCOUNTER — Ambulatory Visit (HOSPITAL_BASED_OUTPATIENT_CLINIC_OR_DEPARTMENT_OTHER): Payer: 59

## 2013-12-01 DIAGNOSIS — C8590 Non-Hodgkin lymphoma, unspecified, unspecified site: Secondary | ICD-10-CM

## 2013-12-01 DIAGNOSIS — Z5112 Encounter for antineoplastic immunotherapy: Secondary | ICD-10-CM

## 2013-12-01 DIAGNOSIS — C8387 Other non-follicular lymphoma, spleen: Secondary | ICD-10-CM

## 2013-12-01 MED ORDER — DIPHENHYDRAMINE HCL 25 MG PO CAPS
ORAL_CAPSULE | ORAL | Status: AC
  Filled 2013-12-01: qty 2

## 2013-12-01 MED ORDER — DEXAMETHASONE SODIUM PHOSPHATE 20 MG/5ML IJ SOLN
INTRAMUSCULAR | Status: AC
  Filled 2013-12-01: qty 5

## 2013-12-01 MED ORDER — SODIUM CHLORIDE 0.9 % IV SOLN
375.0000 mg/m2 | Freq: Once | INTRAVENOUS | Status: AC
  Administered 2013-12-01: 700 mg via INTRAVENOUS
  Filled 2013-12-01: qty 70

## 2013-12-01 MED ORDER — ACETAMINOPHEN 325 MG PO TABS
650.0000 mg | ORAL_TABLET | Freq: Once | ORAL | Status: AC
  Administered 2013-12-01: 650 mg via ORAL

## 2013-12-01 MED ORDER — SODIUM CHLORIDE 0.9 % IV SOLN
Freq: Once | INTRAVENOUS | Status: AC
  Administered 2013-12-01: 10:00:00 via INTRAVENOUS

## 2013-12-01 MED ORDER — DEXAMETHASONE SODIUM PHOSPHATE 20 MG/5ML IJ SOLN
20.0000 mg | Freq: Once | INTRAMUSCULAR | Status: AC
  Administered 2013-12-01: 20 mg via INTRAVENOUS

## 2013-12-01 MED ORDER — DIPHENHYDRAMINE HCL 25 MG PO CAPS
50.0000 mg | ORAL_CAPSULE | Freq: Once | ORAL | Status: AC
  Administered 2013-12-01: 50 mg via ORAL

## 2013-12-01 MED ORDER — PROMETHAZINE HCL 25 MG/ML IJ SOLN
25.0000 mg | Freq: Once | INTRAMUSCULAR | Status: AC
  Administered 2013-12-01: 25 mg via INTRAVENOUS
  Filled 2013-12-01: qty 1

## 2013-12-01 MED ORDER — ACETAMINOPHEN 325 MG PO TABS
ORAL_TABLET | ORAL | Status: AC
  Filled 2013-12-01: qty 2

## 2013-12-01 NOTE — Patient Instructions (Signed)
[facility] Discharge Instructions for Patients Receiving Chemotherapy  Today you received the following chemotherapy agents rituxan.   To help prevent nausea and vomiting after your treatment, we encourage you to take your nausea medication as directed.     If you develop nausea and vomiting that is not controlled by your nausea medication, call the clinic.   BELOW ARE SYMPTOMS THAT SHOULD BE REPORTED IMMEDIATELY:  *FEVER GREATER THAN 100.5 F  *CHILLS WITH OR WITHOUT FEVER  NAUSEA AND VOMITING THAT IS NOT CONTROLLED WITH YOUR NAUSEA MEDICATION  *UNUSUAL SHORTNESS OF BREATH  *UNUSUAL BRUISING OR BLEEDING  TENDERNESS IN MOUTH AND THROAT WITH OR WITHOUT PRESENCE OF ULCERS  *URINARY PROBLEMS  *BOWEL PROBLEMS  UNUSUAL RASH Items with * indicate a potential emergency and should be followed up as soon as possible.  Feel free to call the clinic you have any questions or concerns. The clinic phone number is [phone].  

## 2013-12-05 ENCOUNTER — Ambulatory Visit (HOSPITAL_COMMUNITY): Payer: 59

## 2013-12-05 ENCOUNTER — Other Ambulatory Visit: Payer: Self-pay | Admitting: Hematology and Oncology

## 2013-12-05 ENCOUNTER — Telehealth: Payer: Self-pay | Admitting: *Deleted

## 2013-12-05 ENCOUNTER — Telehealth: Payer: Self-pay | Admitting: Hematology and Oncology

## 2013-12-05 NOTE — Telephone Encounter (Signed)
I recommend labs with PET scan tomorrow so we have all results ready

## 2013-12-05 NOTE — Telephone Encounter (Signed)
Pt has MD and infusion appt this Friday 11/20.  She says she thinks she is supposed to have lab appt as well?

## 2013-12-05 NOTE — Telephone Encounter (Signed)
s.w. pt and advised on 11.18 appt....ok and aware

## 2013-12-06 ENCOUNTER — Ambulatory Visit (HOSPITAL_COMMUNITY)
Admission: RE | Admit: 2013-12-06 | Discharge: 2013-12-06 | Disposition: A | Discharge status: Home / Self Care | Payer: 59 | Source: Ambulatory Visit | Attending: Hematology and Oncology | Admitting: Hematology and Oncology

## 2013-12-06 ENCOUNTER — Other Ambulatory Visit (HOSPITAL_BASED_OUTPATIENT_CLINIC_OR_DEPARTMENT_OTHER): Payer: 59

## 2013-12-06 ENCOUNTER — Encounter (HOSPITAL_COMMUNITY): Payer: Self-pay

## 2013-12-06 DIAGNOSIS — D696 Thrombocytopenia, unspecified: Secondary | ICD-10-CM

## 2013-12-06 DIAGNOSIS — C8590 Non-Hodgkin lymphoma, unspecified, unspecified site: Secondary | ICD-10-CM

## 2013-12-06 DIAGNOSIS — C8387 Other non-follicular lymphoma, spleen: Secondary | ICD-10-CM

## 2013-12-06 DIAGNOSIS — R161 Splenomegaly, not elsewhere classified: Secondary | ICD-10-CM

## 2013-12-06 DIAGNOSIS — N189 Chronic kidney disease, unspecified: Secondary | ICD-10-CM

## 2013-12-06 LAB — COMPREHENSIVE METABOLIC PANEL (CC13)
ALK PHOS: 104 U/L (ref 40–150)
ALT: 10 U/L (ref 0–55)
ANION GAP: 9 meq/L (ref 3–11)
AST: 14 U/L (ref 5–34)
Albumin: 4 g/dL (ref 3.5–5.0)
BILIRUBIN TOTAL: 0.61 mg/dL (ref 0.20–1.20)
BUN: 22.3 mg/dL (ref 7.0–26.0)
CO2: 25 meq/L (ref 22–29)
CREATININE: 1.5 mg/dL — AB (ref 0.6–1.1)
Calcium: 9.5 mg/dL (ref 8.4–10.4)
Chloride: 107 mEq/L (ref 98–109)
Glucose: 80 mg/dl (ref 70–140)
Potassium: 3.8 mEq/L (ref 3.5–5.1)
SODIUM: 141 meq/L (ref 136–145)
TOTAL PROTEIN: 5.9 g/dL — AB (ref 6.4–8.3)

## 2013-12-06 LAB — CBC WITH DIFFERENTIAL/PLATELET
BASO%: 0.9 % (ref 0.0–2.0)
Basophils Absolute: 0 10*3/uL (ref 0.0–0.1)
EOS%: 1.3 % (ref 0.0–7.0)
Eosinophils Absolute: 0.1 10*3/uL (ref 0.0–0.5)
HEMATOCRIT: 39.1 % (ref 34.8–46.6)
HGB: 13 g/dL (ref 11.6–15.9)
LYMPH%: 25.3 % (ref 14.0–49.7)
MCH: 28.9 pg (ref 25.1–34.0)
MCHC: 33.2 g/dL (ref 31.5–36.0)
MCV: 87.1 fL (ref 79.5–101.0)
MONO#: 0.4 10*3/uL (ref 0.1–0.9)
MONO%: 6.4 % (ref 0.0–14.0)
NEUT#: 3.7 10*3/uL (ref 1.5–6.5)
NEUT%: 66.1 % (ref 38.4–76.8)
PLATELETS: 117 10*3/uL — AB (ref 145–400)
RBC: 4.48 10*6/uL (ref 3.70–5.45)
RDW: 13.3 % (ref 11.2–14.5)
WBC: 5.5 10*3/uL (ref 3.9–10.3)
lymph#: 1.4 10*3/uL (ref 0.9–3.3)

## 2013-12-06 LAB — URIC ACID (CC13): Uric Acid, Serum: 6.2 mg/dl (ref 2.6–7.4)

## 2013-12-06 LAB — LACTATE DEHYDROGENASE (CC13): LDH: 158 U/L (ref 125–245)

## 2013-12-06 LAB — GLUCOSE, CAPILLARY: Glucose-Capillary: 77 mg/dL (ref 70–99)

## 2013-12-06 MED ORDER — FLUDEOXYGLUCOSE F - 18 (FDG) INJECTION
7.3000 | Freq: Once | INTRAVENOUS | Status: AC | PRN
  Administered 2013-12-06: 7.3 via INTRAVENOUS

## 2013-12-08 ENCOUNTER — Ambulatory Visit (HOSPITAL_BASED_OUTPATIENT_CLINIC_OR_DEPARTMENT_OTHER): Payer: 59 | Admitting: Hematology and Oncology

## 2013-12-08 ENCOUNTER — Telehealth: Payer: Self-pay | Admitting: Hematology and Oncology

## 2013-12-08 ENCOUNTER — Ambulatory Visit (HOSPITAL_BASED_OUTPATIENT_CLINIC_OR_DEPARTMENT_OTHER): Payer: 59

## 2013-12-08 VITALS — BP 110/64 | HR 78 | Temp 97.3°F | Resp 18 | Ht 67.0 in | Wt 147.5 lb

## 2013-12-08 DIAGNOSIS — C8598 Non-Hodgkin lymphoma, unspecified, lymph nodes of multiple sites: Secondary | ICD-10-CM

## 2013-12-08 DIAGNOSIS — C8590 Non-Hodgkin lymphoma, unspecified, unspecified site: Secondary | ICD-10-CM

## 2013-12-08 DIAGNOSIS — N189 Chronic kidney disease, unspecified: Secondary | ICD-10-CM

## 2013-12-08 DIAGNOSIS — K648 Other hemorrhoids: Secondary | ICD-10-CM

## 2013-12-08 DIAGNOSIS — K649 Unspecified hemorrhoids: Secondary | ICD-10-CM

## 2013-12-08 DIAGNOSIS — D696 Thrombocytopenia, unspecified: Secondary | ICD-10-CM

## 2013-12-08 DIAGNOSIS — N182 Chronic kidney disease, stage 2 (mild): Secondary | ICD-10-CM

## 2013-12-08 DIAGNOSIS — Z5112 Encounter for antineoplastic immunotherapy: Secondary | ICD-10-CM

## 2013-12-08 MED ORDER — ACETAMINOPHEN 325 MG PO TABS
650.0000 mg | ORAL_TABLET | Freq: Once | ORAL | Status: AC
  Administered 2013-12-08: 650 mg via ORAL

## 2013-12-08 MED ORDER — ACETAMINOPHEN 325 MG PO TABS
ORAL_TABLET | ORAL | Status: AC
  Filled 2013-12-08: qty 2

## 2013-12-08 MED ORDER — SODIUM CHLORIDE 0.9 % IV SOLN
Freq: Once | INTRAVENOUS | Status: AC
  Administered 2013-12-08: 09:00:00 via INTRAVENOUS

## 2013-12-08 MED ORDER — SODIUM CHLORIDE 0.9 % IV SOLN
375.0000 mg/m2 | Freq: Once | INTRAVENOUS | Status: AC
  Administered 2013-12-08: 700 mg via INTRAVENOUS
  Filled 2013-12-08: qty 70

## 2013-12-08 MED ORDER — DIPHENHYDRAMINE HCL 25 MG PO CAPS
50.0000 mg | ORAL_CAPSULE | Freq: Once | ORAL | Status: AC
  Administered 2013-12-08: 50 mg via ORAL

## 2013-12-08 MED ORDER — DEXAMETHASONE SODIUM PHOSPHATE 20 MG/5ML IJ SOLN
20.0000 mg | Freq: Once | INTRAMUSCULAR | Status: AC
  Administered 2013-12-08: 20 mg via INTRAVENOUS

## 2013-12-08 MED ORDER — DIPHENHYDRAMINE HCL 25 MG PO CAPS
ORAL_CAPSULE | ORAL | Status: AC
  Filled 2013-12-08: qty 2

## 2013-12-08 MED ORDER — PROMETHAZINE HCL 25 MG/ML IJ SOLN
25.0000 mg | Freq: Once | INTRAMUSCULAR | Status: AC
  Administered 2013-12-08: 25 mg via INTRAVENOUS
  Filled 2013-12-08: qty 1

## 2013-12-08 MED ORDER — DEXAMETHASONE SODIUM PHOSPHATE 20 MG/5ML IJ SOLN
INTRAMUSCULAR | Status: AC
  Filled 2013-12-08: qty 5

## 2013-12-08 NOTE — Patient Instructions (Signed)

## 2013-12-08 NOTE — Assessment & Plan Note (Signed)
Clinically, she has no signs of recurrence. PET/CT scan is negative I will see her again in 6 months with repeat maintenance treatment. She wants to start treatment after her next year's birthday which I think is reasonable.

## 2013-12-08 NOTE — Progress Notes (Signed)
[facility] OFFICE PROGRESS NOTE  Patient Care Team: Olga Millers, MD as PCP - General (Internal Medicine)  SUMMARY OF ONCOLOGIC HISTORY: Oncology History   Non-Hodgkin lymphoma, marginal zone lymphoma   Primary site: Lymphoid Neoplasms   Staging method: AJCC 6th Edition   Clinical: Stage IV signed by Heath Lark, MD on 05/30/2013  8:47 AM   Summary: Stage IV       Non-Hodgkin lymphoma   02/23/2013 Imaging CT scan show significant enlargement of liver and spleen   05/05/2013 Bone Marrow Biopsy Bone marrow aspirate and biopsy confirmed low grade lymphoma, suspect marginal zone lymphoma   05/08/2013 Imaging PET scan show involvement of liver and spleen   06/02/2013 - 06/23/2013 Chemotherapy She was started on weekly rituximab.   09/04/2013 Imaging PET CT scan show complete response to treatment with splenic size reduced back to normal limits. She has persistent mild thrombocytopenia.   12/06/2013 Imaging PET scan was negative and spleen size is normal    INTERVAL HISTORY: Please see below for problem oriented charting. She returns today to follow-up on test results. She continues to have intermittent hemorrhoidal bleeding.  REVIEW OF SYSTEMS:   Constitutional: Denies fevers, chills or abnormal weight loss Eyes: Denies blurriness of vision Ears, nose, mouth, throat, and face: Denies mucositis or sore throat Respiratory: Denies cough, dyspnea or wheezes Cardiovascular: Denies palpitation, chest discomfort or lower extremity swelling Gastrointestinal:  Denies nausea, heartburn or change in bowel habits Skin: Denies abnormal skin rashes Lymphatics: Denies new lymphadenopathy or easy bruising Neurological:Denies numbness, tingling or new weaknesses Behavioral/Psych: Mood is stable, no new changes  All other systems were reviewed with the patient and are negative.  I have reviewed the past medical history, past surgical history, social history and family history with the  patient and they are unchanged from previous note.  ALLERGIES:  is allergic to biaxin; erythromycin; soy allergy; and zithromax.  MEDICATIONS:  Current Outpatient Prescriptions  Medication Sig Dispense Refill  . estradiol (ESTRACE) 2 MG tablet Take 2 mg by mouth every evening.     . Fe Fum-FePoly-Vit C-Vit B3 (INTEGRA) 62.5-62.5-40-3 MG CAPS Take 1 capsule by mouth daily. 90 capsule 1  . folic acid (FOLVITE) 1 MG tablet Take 1 tablet (1 mg total) by mouth daily. 30 tablet 6  . hydrocortisone (ANUSOL-HC) 25 MG suppository Place 1 suppository (25 mg total) rectally 2 (two) times daily. 24 suppository 6  . Linaclotide (LINZESS) 290 MCG CAPS capsule Take 1 capsule (290 mcg total) by mouth every morning. 30 capsule 5  . metoCLOPramide (REGLAN) 5 MG tablet TAKE 1 TABLET BY MOUTH 2 (TWO) TIMES DAILY AT 10 AM AND 4 PM. 180 tablet 1  . progesterone (PROMETRIUM) 100 MG capsule 100 mg. For 12 days after Estradiol    . temazepam (RESTORIL) 30 MG capsule Take 2 capsules (60 mg total) by mouth at bedtime. 60 capsule 5  . traZODone (DESYREL) 100 MG tablet Take 100 mg by mouth at bedtime. Takes 1/2     No current facility-administered medications for this visit.    PHYSICAL EXAMINATION: ECOG PERFORMANCE STATUS: 0 - Asymptomatic  Filed Vitals:   12/08/13 0844  BP: 110/64  Pulse: 78  Temp: 97.3 F (36.3 C)  Resp: 18   Filed Weights   12/08/13 0844  Weight: 147 lb 8 oz (66.906 kg)    GENERAL:alert, no distress and comfortable SKIN: skin color, texture, turgor are normal, no rashes or significant lesions EYES: normal, Conjunctiva are pink and  non-injected, sclera clear OROPHARYNX:no exudate, no erythema and lips, buccal mucosa, and tongue normal  NECK: supple, thyroid normal size, non-tender, without nodularity LYMPH:  no palpable lymphadenopathy in the cervical, axillary or inguinal LUNGS: clear to auscultation and percussion with normal breathing effort HEART: regular rate & rhythm and no  murmurs and no lower extremity edema ABDOMEN:abdomen soft, non-tender and normal bowel sounds Musculoskeletal:no cyanosis of digits and no clubbing  NEURO: alert & oriented x 3 with fluent speech, no focal motor/sensory deficits  LABORATORY DATA:  I have reviewed the data as listed    Component Value Date/Time   NA 141 12/06/2013 0857   NA 139 05/05/2013 0730   K 3.8 12/06/2013 0857   K 3.9 05/05/2013 0730   CL 101 05/05/2013 0730   CO2 25 12/06/2013 0857   CO2 26 05/05/2013 0730   GLUCOSE 80 12/06/2013 0857   GLUCOSE 83 05/05/2013 0730   BUN 22.3 12/06/2013 0857   BUN 19 05/05/2013 0730   CREATININE 1.5* 12/06/2013 0857   CREATININE 1.51* 05/05/2013 0730   CALCIUM 9.5 12/06/2013 0857   CALCIUM 9.6 05/05/2013 0730   PROT 5.9* 12/06/2013 0857   PROT 6.4 05/05/2013 0730   ALBUMIN 4.0 12/06/2013 0857   ALBUMIN 4.0 05/05/2013 0730   AST 14 12/06/2013 0857   AST 23 05/05/2013 0730   ALT 10 12/06/2013 0857   ALT 17 05/05/2013 0730   ALKPHOS 104 12/06/2013 0857   ALKPHOS 100 05/05/2013 0730   BILITOT 0.61 12/06/2013 0857   BILITOT 0.5 05/05/2013 0730   GFRNONAA 35* 05/05/2013 0730   GFRAA 41* 05/05/2013 0730    No results found for: SPEP, UPEP  Lab Results  Component Value Date   WBC 5.5 12/06/2013   NEUTROABS 3.7 12/06/2013   HGB 13.0 12/06/2013   HCT 39.1 12/06/2013   MCV 87.1 12/06/2013   PLT 117* 12/06/2013      Chemistry      Component Value Date/Time   NA 141 12/06/2013 0857   NA 139 05/05/2013 0730   K 3.8 12/06/2013 0857   K 3.9 05/05/2013 0730   CL 101 05/05/2013 0730   CO2 25 12/06/2013 0857   CO2 26 05/05/2013 0730   BUN 22.3 12/06/2013 0857   BUN 19 05/05/2013 0730   CREATININE 1.5* 12/06/2013 0857   CREATININE 1.51* 05/05/2013 0730      Component Value Date/Time   CALCIUM 9.5 12/06/2013 0857   CALCIUM 9.6 05/05/2013 0730   ALKPHOS 104 12/06/2013 0857   ALKPHOS 100 05/05/2013 0730   AST 14 12/06/2013 0857   AST 23 05/05/2013 0730   ALT 10  12/06/2013 0857   ALT 17 05/05/2013 0730   BILITOT 0.61 12/06/2013 0857   BILITOT 0.5 05/05/2013 0730       RADIOGRAPHIC STUDIES: I have personally reviewed the radiological images as listed and agreed with the findings in the report. Nm Pet Image Restag (ps) Skull Base To Thigh  12/06/2013   CLINICAL DATA:  Subsequent treatment strategy for non-Hodgkin's lymphoma.  EXAM: NUCLEAR MEDICINE PET SKULL BASE TO THIGH  TECHNIQUE: 7.3 mCi F-18 FDG was injected intravenously. Full-ring PET imaging was performed from the skull base to thigh after the radiotracer. CT data was obtained and used for attenuation correction and anatomic localization.  FASTING BLOOD GLUCOSE:  Value: 77 mg/dl  COMPARISON:  09/04/2013  FINDINGS: NECK  No hypermetabolic lymph nodes in the neck.  CHEST  No hypermetabolic mediastinal or hilar nodes. No suspicious pulmonary nodules on  the CT scan.  ABDOMEN/PELVIS  No abnormal hypermetabolic activity within the liver, pancreas, adrenal glands, or spleen. The spleen measures 7.6 cm in length. No hypermetabolic lymph nodes in the abdomen or pelvis. A small amount of free fluid is noted within the pelvis, nonspecific.  SKELETON  No focal hypermetabolic activity to suggest skeletal metastasis.  IMPRESSION: 1. No evidence for residual or recurrent hypermetabolic tumor within the neck, chest, abdomen or pelvis.   Electronically Signed   By: Kerby Moors M.D.   On: 12/06/2013 11:50     ASSESSMENT & PLAN:  Non-Hodgkin lymphoma Clinically, she has no signs of recurrence. PET/CT scan is negative I will see her again in 6 months with repeat maintenance treatment. She wants to start treatment after her next year's birthday which I think is reasonable.    Chronic kidney disease This is mild. Recommend close observation.   Hemorrhoids The patient have intermittent bleeding from hemorrhoids. She wants to be managed conservatively and does not want referral to GI or Gen. surgery. Continue  conservative management.   Thrombocytopenia The cause is unknown, likely due to consumption. It is mild and there is little change compared from previous platelet count. The patient denies recent history of bleeding such as epistaxis, hematuria or hematochezia up from intermittent hemorrhoidal bleeding. She is asymptomatic from the thrombocytopenia. I will observe for now.        Orders Placed This Encounter  Procedures  . Hepatitis B core antibody, IgM    Standing Status: Future     Number of Occurrences:      Standing Expiration Date: 01/12/2015  . Hepatitis B surface antibody    Standing Status: Future     Number of Occurrences:      Standing Expiration Date: 01/12/2015  . Hepatitis B surface antigen    Standing Status: Future     Number of Occurrences:      Standing Expiration Date: 01/12/2015  . CBC with Differential    Standing Status: Future     Number of Occurrences:      Standing Expiration Date: 01/12/2015  . Comprehensive metabolic panel    Standing Status: Future     Number of Occurrences:      Standing Expiration Date: 01/12/2015  . Lactate dehydrogenase    Standing Status: Future     Number of Occurrences:      Standing Expiration Date: 01/12/2015   All questions were answered. The patient knows to call the clinic with any problems, questions or concerns. No barriers to learning was detected. I spent 30 minutes counseling the patient face to face. The total time spent in the appointment was 40 minutes and more than 50% was on counseling and review of test results     [facility], [place], MD 12/08/2013 8:59 AM

## 2013-12-08 NOTE — Assessment & Plan Note (Signed)
This is mild. Recommend close observation.

## 2013-12-08 NOTE — Assessment & Plan Note (Signed)
The patient have intermittent bleeding from hemorrhoids. She wants to be managed conservatively and does not want referral to GI or Gen. surgery. Continue conservative management.

## 2013-12-08 NOTE — Assessment & Plan Note (Signed)
The cause is unknown, likely due to consumption. It is mild and there is little change compared from previous platelet count. The patient denies recent history of bleeding such as epistaxis, hematuria or hematochezia up from intermittent hemorrhoidal bleeding. She is asymptomatic from the thrombocytopenia. I will observe for now.   

## 2013-12-08 NOTE — Telephone Encounter (Signed)
added pt appts....pt will get new appt from chemo

## 2013-12-18 ENCOUNTER — Telehealth: Payer: Self-pay | Admitting: Internal Medicine

## 2013-12-18 ENCOUNTER — Other Ambulatory Visit: Payer: Self-pay | Admitting: Hematology and Oncology

## 2013-12-18 ENCOUNTER — Other Ambulatory Visit: Payer: Self-pay | Admitting: Geriatric Medicine

## 2013-12-18 DIAGNOSIS — C8590 Non-Hodgkin lymphoma, unspecified, unspecified site: Secondary | ICD-10-CM

## 2013-12-18 MED ORDER — INTEGRA 62.5-62.5-40-3 MG PO CAPS: 1.0000 | ORAL_CAPSULE | Freq: Every day | ORAL | Status: DC

## 2013-12-18 MED ORDER — TEMAZEPAM 30 MG PO CAPS: 60.0000 mg | ORAL_CAPSULE | Freq: Every day | ORAL | Status: AC

## 2013-12-18 NOTE — Telephone Encounter (Signed)
Sent to pharmacy 

## 2013-12-18 NOTE — Telephone Encounter (Signed)
Pt called in and said that she is going to be out of her meds  Fe Fum-FePoly-Vit C-Vit B3 (INTEGRA) 62.5-62.5-40-3 MG CAPS [[id number]] and Temazepam and wanted to see if Dr Doug Sou with call them in to [facility].  She has a new pt appt in Jan but she is out of her meds now.      Best number is [phone]

## 2014-01-23 ENCOUNTER — Ambulatory Visit: Payer: 59 | Admitting: Internal Medicine

## 2014-02-12 ENCOUNTER — Telehealth: Payer: Self-pay | Admitting: Hematology and Oncology

## 2014-02-12 NOTE — Telephone Encounter (Signed)
Faxed pt medical records to [facility]

## 2014-02-27 ENCOUNTER — Other Ambulatory Visit: Payer: Self-pay | Admitting: Hematology and Oncology

## 2014-03-12 ENCOUNTER — Encounter: Payer: Self-pay | Admitting: *Deleted

## 2014-03-12 ENCOUNTER — Telehealth: Payer: Self-pay | Admitting: *Deleted

## 2014-03-12 NOTE — Telephone Encounter (Signed)
I do not understand this message. We can cancel her Rx and reschedule labs and appt to see me 6/10 instead. I can see her 6/10 at 9am, 30 mins

## 2014-03-12 NOTE — Telephone Encounter (Signed)
Patient called requesting to "move her treatment she receives every six months to the first Friday in May.  I just learned My Granddaughter graduates June 3,2016."  Will notify Dr. Alvy Bimler of this request.  Patient's return call number is [phone].

## 2014-03-13 ENCOUNTER — Encounter: Payer: Self-pay | Admitting: *Deleted

## 2014-03-13 ENCOUNTER — Telehealth: Payer: Self-pay | Admitting: Hematology and Oncology

## 2014-03-13 ENCOUNTER — Other Ambulatory Visit: Payer: Self-pay | Admitting: *Deleted

## 2014-03-13 NOTE — Telephone Encounter (Signed)
s.w. pt and confirmed appt in May....pt ok and aware

## 2014-03-13 NOTE — Telephone Encounter (Signed)
s.w pt and advised on 6.10 appt....she stated that she wanted to have her appts in start 5.6.Marland KitchenMarland KitchenMarland Kitchenemialed MD

## 2014-05-07 ENCOUNTER — Encounter: Payer: Self-pay | Admitting: Hematology and Oncology

## 2014-05-07 NOTE — Progress Notes (Signed)
I faxed fmla forms to [employer]  [phone] and I spoke with patient and she wants copy for her records. I verified her address to mail her copy.

## 2014-05-07 NOTE — Progress Notes (Signed)
I left forms for fmla for the patient on the desk of nurse for dr. Alvy Bimler.

## 2014-05-09 ENCOUNTER — Telehealth: Payer: Self-pay | Admitting: *Deleted

## 2014-05-09 ENCOUNTER — Encounter: Payer: Self-pay | Admitting: Hematology and Oncology

## 2014-05-09 NOTE — Progress Notes (Signed)
I sent form on clarification about sitting/standing/lifting/pushins  [phone] is where I faxed

## 2014-05-09 NOTE — Telephone Encounter (Signed)
TC from patient to clarify her appointments for 05/25/14 - she wanted to make sure she sees Dr. Alvy Bimler that day. She does see Dr. Alvy Bimler @ 8:30 am, prior to her chemo treatment.

## 2014-05-11 ENCOUNTER — Encounter: Payer: Self-pay | Admitting: Hematology and Oncology

## 2014-05-11 NOTE — Progress Notes (Signed)
I faxed the form back to [employer]   [phone]

## 2014-05-25 ENCOUNTER — Ambulatory Visit (HOSPITAL_BASED_OUTPATIENT_CLINIC_OR_DEPARTMENT_OTHER): Payer: 59 | Admitting: Hematology and Oncology

## 2014-05-25 ENCOUNTER — Ambulatory Visit (HOSPITAL_BASED_OUTPATIENT_CLINIC_OR_DEPARTMENT_OTHER): Payer: 59

## 2014-05-25 ENCOUNTER — Telehealth: Payer: Self-pay | Admitting: Hematology and Oncology

## 2014-05-25 ENCOUNTER — Other Ambulatory Visit (HOSPITAL_BASED_OUTPATIENT_CLINIC_OR_DEPARTMENT_OTHER): Payer: 59

## 2014-05-25 ENCOUNTER — Encounter: Payer: Self-pay | Admitting: Hematology and Oncology

## 2014-05-25 VITALS — BP 124/60 | HR 67 | Temp 97.6°F | Resp 18 | Ht 67.0 in | Wt 149.7 lb

## 2014-05-25 VITALS — BP 126/65 | HR 72 | Temp 97.1°F | Resp 16

## 2014-05-25 DIAGNOSIS — C8590 Non-Hodgkin lymphoma, unspecified, unspecified site: Secondary | ICD-10-CM | POA: Diagnosis not present

## 2014-05-25 DIAGNOSIS — G2402 Drug induced acute dystonia: Secondary | ICD-10-CM | POA: Diagnosis not present

## 2014-05-25 DIAGNOSIS — D696 Thrombocytopenia, unspecified: Secondary | ICD-10-CM

## 2014-05-25 DIAGNOSIS — R600 Localized edema: Secondary | ICD-10-CM | POA: Insufficient documentation

## 2014-05-25 DIAGNOSIS — N189 Chronic kidney disease, unspecified: Secondary | ICD-10-CM

## 2014-05-25 DIAGNOSIS — N182 Chronic kidney disease, stage 2 (mild): Secondary | ICD-10-CM

## 2014-05-25 DIAGNOSIS — N183 Chronic kidney disease, stage 3 unspecified: Secondary | ICD-10-CM

## 2014-05-25 DIAGNOSIS — Z5112 Encounter for antineoplastic immunotherapy: Secondary | ICD-10-CM | POA: Diagnosis not present

## 2014-05-25 HISTORY — DX: Chronic kidney disease, stage 3 unspecified: N18.30

## 2014-05-25 LAB — COMPREHENSIVE METABOLIC PANEL (CC13)
ALK PHOS: 129 U/L (ref 40–150)
ALT: 11 U/L (ref 0–55)
ANION GAP: 12 meq/L — AB (ref 3–11)
AST: 14 U/L (ref 5–34)
Albumin: 4 g/dL (ref 3.5–5.0)
BILIRUBIN TOTAL: 0.43 mg/dL (ref 0.20–1.20)
BUN: 23.6 mg/dL (ref 7.0–26.0)
CO2: 25 mEq/L (ref 22–29)
CREATININE: 1.2 mg/dL — AB (ref 0.6–1.1)
Calcium: 9.4 mg/dL (ref 8.4–10.4)
Chloride: 102 mEq/L (ref 98–109)
EGFR: 47 mL/min/{1.73_m2} — AB (ref >90)
GLUCOSE: 72 mg/dL (ref 70–140)
Potassium: 3.8 mEq/L (ref 3.5–5.1)
SODIUM: 140 meq/L (ref 136–145)
TOTAL PROTEIN: 6 g/dL — AB (ref 6.4–8.3)

## 2014-05-25 LAB — CBC WITH DIFFERENTIAL/PLATELET
BASO%: 0.5 % (ref 0.0–2.0)
Basophils Absolute: 0 10*3/uL (ref 0.0–0.1)
EOS ABS: 0.1 10*3/uL (ref 0.0–0.5)
EOS%: 0.9 % (ref 0.0–7.0)
HCT: 35.3 % (ref 34.8–46.6)
HEMOGLOBIN: 12.3 g/dL (ref 11.6–15.9)
LYMPH%: 23.9 % (ref 14.0–49.7)
MCH: 30.8 pg (ref 25.1–34.0)
MCHC: 34.8 g/dL (ref 31.5–36.0)
MCV: 88.5 fL (ref 79.5–101.0)
MONO#: 0.6 10*3/uL (ref 0.1–0.9)
MONO%: 8.5 % (ref 0.0–14.0)
NEUT%: 66.2 % (ref 38.4–76.8)
NEUTROS ABS: 4.3 10*3/uL (ref 1.5–6.5)
PLATELETS: 110 10*3/uL — AB (ref 145–400)
RBC: 3.99 10*6/uL (ref 3.70–5.45)
RDW: 12.4 % (ref 11.2–14.5)
WBC: 6.6 10*3/uL (ref 3.9–10.3)
lymph#: 1.6 10*3/uL (ref 0.9–3.3)

## 2014-05-25 LAB — LACTATE DEHYDROGENASE (CC13): LDH: 173 U/L (ref 125–245)

## 2014-05-25 LAB — HEPATITIS B CORE ANTIBODY, IGM: Hep B C IgM: NONREACTIVE

## 2014-05-25 LAB — HEPATITIS B SURFACE ANTIBODY,QUALITATIVE: Hep B S Ab: NEGATIVE

## 2014-05-25 LAB — HEPATITIS B SURFACE ANTIGEN: Hepatitis B Surface Ag: NEGATIVE

## 2014-05-25 MED ORDER — ACETAMINOPHEN 325 MG PO TABS
ORAL_TABLET | ORAL | Status: AC
  Filled 2014-05-25: qty 2

## 2014-05-25 MED ORDER — DEXAMETHASONE SODIUM PHOSPHATE 20 MG/5ML IJ SOLN
20.0000 mg | Freq: Once | INTRAMUSCULAR | Status: DC
  Filled 2014-05-25: qty 5

## 2014-05-25 MED ORDER — SPIRONOLACTONE 25 MG PO TABS: 25.0000 mg | ORAL_TABLET | Freq: Every day | ORAL | Status: DC

## 2014-05-25 MED ORDER — SODIUM CHLORIDE 0.9 % IV SOLN
20.0000 mg | Freq: Once | INTRAVENOUS | Status: AC
  Administered 2014-05-25 (×2): 20 mg via INTRAVENOUS
  Filled 2014-05-25: qty 2

## 2014-05-25 MED ORDER — SODIUM CHLORIDE 0.9 % IV SOLN
375.0000 mg/m2 | Freq: Once | INTRAVENOUS | Status: AC
  Administered 2014-05-25: 700 mg via INTRAVENOUS
  Filled 2014-05-25: qty 70

## 2014-05-25 MED ORDER — LORAZEPAM 2 MG/ML IJ SOLN
0.5000 mg | Freq: Once | INTRAMUSCULAR | Status: AC
  Administered 2014-05-25: 0.5 mg via INTRAVENOUS

## 2014-05-25 MED ORDER — SODIUM CHLORIDE 0.9 % IV SOLN
Freq: Once | INTRAVENOUS | Status: AC
  Administered 2014-05-25: 10:00:00 via INTRAVENOUS

## 2014-05-25 MED ORDER — LORAZEPAM 2 MG/ML IJ SOLN
INTRAMUSCULAR | Status: AC
Start: 2014-05-25 — End: 2014-05-25
  Filled 2014-05-25: qty 1

## 2014-05-25 MED ORDER — DIPHENHYDRAMINE HCL 25 MG PO CAPS
50.0000 mg | ORAL_CAPSULE | Freq: Once | ORAL | Status: AC
  Administered 2014-05-25: 50 mg via ORAL

## 2014-05-25 MED ORDER — DIPHENHYDRAMINE HCL 25 MG PO CAPS
ORAL_CAPSULE | ORAL | Status: AC
Start: 2014-05-25 — End: 2014-05-25
  Filled 2014-05-25: qty 2

## 2014-05-25 MED ORDER — PROMETHAZINE HCL 25 MG/ML IJ SOLN
25.0000 mg | Freq: Once | INTRAMUSCULAR | Status: AC
  Administered 2014-05-25: 25 mg via INTRAVENOUS
  Filled 2014-05-25: qty 1

## 2014-05-25 MED ORDER — ACETAMINOPHEN 325 MG PO TABS
650.0000 mg | ORAL_TABLET | Freq: Once | ORAL | Status: AC
  Administered 2014-05-25: 650 mg via ORAL

## 2014-05-25 MED ORDER — SODIUM CHLORIDE 0.9 % IV SOLN
Freq: Once | INTRAVENOUS | Status: AC
Start: 2014-05-25 — End: 2014-05-25
  Administered 2014-05-25: 11:00:00 via INTRAVENOUS

## 2014-05-25 NOTE — Patient Instructions (Signed)

## 2014-05-25 NOTE — Assessment & Plan Note (Signed)
She has intermittent bilateral lower extremity edema which I think is attributed by chronic kidney disease. Previously, she did not tolerate Lasix at because significant hypokalemia. I recommend a trial of potassium sparing agent such as spironolactone but in the long run I recommend follow-up with nephrologist.

## 2014-05-25 NOTE — Assessment & Plan Note (Signed)
She has intermittent right upper quadrant pain which is new. She also complained of bilateral lower extremity edema. I am concerned about possible cancer recurrence in her abdomen. I recommend we proceed with rituximab but I will try to order a PET CT scan to be done prior to her next infusion next week for further staging. She agree with the plan.

## 2014-05-25 NOTE — Assessment & Plan Note (Signed)
The cause is unknown, likely due to consumption. It is mild and there is little change compared from previous platelet count. The patient denies recent history of bleeding such as epistaxis, hematuria or hematochezia up from intermittent hemorrhoidal bleeding. She is asymptomatic from the thrombocytopenia. I will observe for now.   

## 2014-05-25 NOTE — Telephone Encounter (Signed)
Faxed pt medical records to [place] [facility] [phone]

## 2014-05-25 NOTE — Assessment & Plan Note (Signed)
She developed some mild dystonic reaction with IV Benadryl. I recommend discontinuation of Benadryl as premedication. She will continue to use Compazine/Phenergan as needed for nausea. The patient appeared incredibly anxious. I recommend a small dose lorazepam as needed for anxiety.

## 2014-05-25 NOTE — Assessment & Plan Note (Signed)
She has mild, intermittent elevated creatinine. She also complained of bilateral lower extremity edema which I think is related to mild chronic renal failure. Recommend nephrology consultation.

## 2014-05-25 NOTE — Progress Notes (Signed)
[facility] OFFICE PROGRESS NOTE  Patient Care Team: Olga Millers, MD as PCP - General (Internal Medicine)  SUMMARY OF ONCOLOGIC HISTORY: Oncology History   Non-Hodgkin lymphoma, marginal zone lymphoma   Primary site: Lymphoid Neoplasms   Staging method: AJCC 6th Edition   Clinical: Stage IV signed by Heath Lark, MD on 05/30/2013  8:47 AM   Summary: Stage IV       Non-Hodgkin lymphoma   02/23/2013 Imaging CT scan show significant enlargement of liver and spleen   05/05/2013 Bone Marrow Biopsy Bone marrow aspirate and biopsy confirmed low grade lymphoma, suspect marginal zone lymphoma   05/08/2013 Imaging PET scan show involvement of liver and spleen   06/02/2013 - 06/23/2013 Chemotherapy She was started on weekly rituximab.   09/04/2013 Imaging PET CT scan show complete response to treatment with splenic size reduced back to normal limits. She has persistent mild thrombocytopenia.   12/06/2013 Imaging PET scan was negative and spleen size is normal    INTERVAL HISTORY: Please see below for problem oriented charting. She returns today prior to infusion of rituximab. She has multiple complaints. She complained of intermittent abdominal cramping and significant bilateral lower extremity edema. She denies new lymphadenopathy. In the course of infusion, she was reevaluated a second time due to acute dystonic reaction after infusion of Benadryl. The patient appears very anxious today.  REVIEW OF SYSTEMS:   Constitutional: Denies fevers, chills or abnormal weight loss Eyes: Denies blurriness of vision Ears, nose, mouth, throat, and face: Denies mucositis or sore throat Respiratory: Denies cough, dyspnea or wheezes Cardiovascular: Denies palpitation, chest discomfort with mild bilateral lower extremity swelling Gastrointestinal:  Denies nausea, heartburn or change in bowel habits Skin: Denies abnormal skin rashes Lymphatics: Denies new lymphadenopathy or easy  bruising Neurological:Denies numbness, tingling or new weaknesses Behavioral/Psych: Mood is stable, no new changes  All other systems were reviewed with the patient and are negative.  I have reviewed the past medical history, past surgical history, social history and family history with the patient and they are unchanged from previous note.  ALLERGIES:  is allergic to biaxin; erythromycin; soy allergy; and zithromax.  MEDICATIONS:  Current Outpatient Prescriptions  Medication Sig Dispense Refill  . estradiol (ESTRACE) 2 MG tablet Take 2 mg by mouth every evening.     . Fe Fum-FePoly-Vit C-Vit B3 (INTEGRA) 62.5-62.5-40-3 MG CAPS Take 1 capsule by mouth daily. 30 capsule 1  . folic acid (FOLVITE) 1 MG tablet TAKE 1 TABLET (1 MG TOTAL) BY MOUTH DAILY. 30 tablet PRN  . Linaclotide (LINZESS) 290 MCG CAPS capsule Take 1 capsule (290 mcg total) by mouth every morning. 30 capsule 5  . metoCLOPramide (REGLAN) 5 MG tablet TAKE 1 TABLET BY MOUTH 2 (TWO) TIMES DAILY AT 10 AM AND 4 PM. 180 tablet 1  . progesterone (PROMETRIUM) 100 MG capsule 100 mg. For 12 days after Estradiol    . temazepam (RESTORIL) 30 MG capsule Take 2 capsules (60 mg total) by mouth at bedtime. 60 capsule 1  . traZODone (DESYREL) 50 MG tablet Take 50 mg by mouth at bedtime.    Marland Kitchen spironolactone (ALDACTONE) 25 MG tablet Take 1 tablet (25 mg total) by mouth daily. 30 tablet 3   No current facility-administered medications for this visit.    PHYSICAL EXAMINATION: ECOG PERFORMANCE STATUS: 1 - Symptomatic but completely ambulatory  Filed Vitals:   05/25/14 0833  BP: 124/60  Pulse: 67  Temp: 97.6 F (36.4 C)  Resp: 18  Filed Weights   05/25/14 0833  Weight: 149 lb 11.2 oz (67.903 kg)    GENERAL:alert, no distress and comfortable SKIN: skin color, texture, turgor are normal, no rashes or significant lesions EYES: normal, Conjunctiva are pink and non-injected, sclera clear OROPHARYNX:no exudate, no erythema and lips,  buccal mucosa, and tongue normal  NECK: supple, thyroid normal size, non-tender, without nodularity LYMPH:  no palpable lymphadenopathy in the cervical, axillary or inguinal LUNGS: clear to auscultation and percussion with normal breathing effort HEART: regular rate & rhythm and no murmurs with mild lower extremity edema ABDOMEN:abdomen soft, non-tender and normal bowel sounds Musculoskeletal:no cyanosis of digits and no clubbing  NEURO: alert & oriented x 3 with fluent speech, no focal motor/sensory deficits  LABORATORY DATA:  I have reviewed the data as listed    Component Value Date/Time   NA 140 05/25/2014 0808   NA 139 05/05/2013 0730   K 3.8 05/25/2014 0808   K 3.9 05/05/2013 0730   CL 101 05/05/2013 0730   CO2 25 05/25/2014 0808   CO2 26 05/05/2013 0730   GLUCOSE 72 05/25/2014 0808   GLUCOSE 83 05/05/2013 0730   BUN 23.6 05/25/2014 0808   BUN 19 05/05/2013 0730   CREATININE 1.2* 05/25/2014 0808   CREATININE 1.51* 05/05/2013 0730   CALCIUM 9.4 05/25/2014 0808   CALCIUM 9.6 05/05/2013 0730   PROT 6.0* 05/25/2014 0808   PROT 6.4 05/05/2013 0730   ALBUMIN 4.0 05/25/2014 0808   ALBUMIN 4.0 05/05/2013 0730   AST 14 05/25/2014 0808   AST 23 05/05/2013 0730   ALT 11 05/25/2014 0808   ALT 17 05/05/2013 0730   ALKPHOS 129 05/25/2014 0808   ALKPHOS 100 05/05/2013 0730   BILITOT 0.43 05/25/2014 0808   BILITOT 0.5 05/05/2013 0730   GFRNONAA 35* 05/05/2013 0730   GFRAA 41* 05/05/2013 0730    No results found for: SPEP, UPEP  Lab Results  Component Value Date   WBC 6.6 05/25/2014   NEUTROABS 4.3 05/25/2014   HGB 12.3 05/25/2014   HCT 35.3 05/25/2014   MCV 88.5 05/25/2014   PLT 110* 05/25/2014      Chemistry      Component Value Date/Time   NA 140 05/25/2014 0808   NA 139 05/05/2013 0730   K 3.8 05/25/2014 0808   K 3.9 05/05/2013 0730   CL 101 05/05/2013 0730   CO2 25 05/25/2014 0808   CO2 26 05/05/2013 0730   BUN 23.6 05/25/2014 0808   BUN 19 05/05/2013 0730    CREATININE 1.2* 05/25/2014 0808   CREATININE 1.51* 05/05/2013 0730      Component Value Date/Time   CALCIUM 9.4 05/25/2014 0808   CALCIUM 9.6 05/05/2013 0730   ALKPHOS 129 05/25/2014 0808   ALKPHOS 100 05/05/2013 0730   AST 14 05/25/2014 0808   AST 23 05/05/2013 0730   ALT 11 05/25/2014 0808   ALT 17 05/05/2013 0730   BILITOT 0.43 05/25/2014 0808   BILITOT 0.5 05/05/2013 0730     ASSESSMENT & PLAN:  Non-Hodgkin lymphoma She has intermittent right upper quadrant pain which is new. She also complained of bilateral lower extremity edema. I am concerned about possible cancer recurrence in her abdomen. I recommend we proceed with rituximab but I will try to order a PET CT scan to be done prior to her next infusion next week for further staging. She agree with the plan.   Chronic kidney disease She has mild, intermittent elevated creatinine. She also complained of bilateral  lower extremity edema which I think is related to mild chronic renal failure. Recommend nephrology consultation.   Thrombocytopenia The cause is unknown, likely due to consumption. It is mild and there is little change compared from previous platelet count. The patient denies recent history of bleeding such as epistaxis, hematuria or hematochezia up from intermittent hemorrhoidal bleeding. She is asymptomatic from the thrombocytopenia. I will observe for now.     Acute dystonic reaction due to drugs She developed some mild dystonic reaction with IV Benadryl. I recommend discontinuation of Benadryl as premedication. She will continue to use Compazine/Phenergan as needed for nausea. The patient appeared incredibly anxious. I recommend a small dose lorazepam as needed for anxiety.   Bilateral lower extremity edema She has intermittent bilateral lower extremity edema which I think is attributed by chronic kidney disease. Previously, she did not tolerate Lasix at because significant hypokalemia. I recommend a  trial of potassium sparing agent such as spironolactone but in the long run I recommend follow-up with nephrologist.    Orders Placed This Encounter  Procedures  . NM PET Image Restag (PS) Skull Base To Thigh    Standing Status: Future     Number of Occurrences:      Standing Expiration Date: 07/25/2015    Order Specific Question:  Reason for Exam (SYMPTOM  OR DIAGNOSIS REQUIRED)    Answer:  staging lymphoma, exclude recurrence, ongoing therapy    Order Specific Question:  Preferred imaging location?    Answer:  [facility]  . Ambulatory referral to Nephrology    Referral Priority:  Routine    Referral Type:  Consultation    Referral Reason:  Specialty Services Required    Requested Specialty:  Nephrology    Number of Visits Requested:  1   All questions were answered. The patient knows to call the clinic with any problems, questions or concerns. No barriers to learning was detected. I spent 40 minutes counseling the patient face to face. The total time spent in the appointment was 55 minutes and more than 50% was on counseling and review of test results     [facility], [place], MD 05/25/2014 2:40 PM

## 2014-05-28 ENCOUNTER — Telehealth: Payer: Self-pay | Admitting: *Deleted

## 2014-05-28 ENCOUNTER — Other Ambulatory Visit: Payer: Self-pay | Admitting: Hematology and Oncology

## 2014-05-28 NOTE — Telephone Encounter (Signed)
Notified patient of message below. Pt verbalized understanding

## 2014-05-28 NOTE — Telephone Encounter (Signed)
VM message from pt. She states she has started on spironalactone 25 mg last week (05/25/14) for lower extremity edema..  She now c/o increased dizzyness and has also developed 'pockets' of fluid across her nose (her glasses cut into this area) and then these 'pockets' have moved to areas around her Left eye and along her cheekbone. She would like ti know what to do. Should she try 1/2 tablet? And why is fluid collecting around eyes and face. Please Advise.

## 2014-05-28 NOTE — Telephone Encounter (Signed)
Not sure about the fluid pockets. Her albumin level is OK If she is dizzy, she can try 1/2 tab Still waiting for PET scan to be done

## 2014-05-29 ENCOUNTER — Encounter: Payer: Self-pay | Admitting: *Deleted

## 2014-06-01 ENCOUNTER — Ambulatory Visit (HOSPITAL_BASED_OUTPATIENT_CLINIC_OR_DEPARTMENT_OTHER): Payer: 59

## 2014-06-01 VITALS — BP 115/55 | HR 66 | Temp 98.0°F | Resp 18

## 2014-06-01 DIAGNOSIS — C8387 Other non-follicular lymphoma, spleen: Secondary | ICD-10-CM

## 2014-06-01 DIAGNOSIS — C8590 Non-Hodgkin lymphoma, unspecified, unspecified site: Secondary | ICD-10-CM

## 2014-06-01 DIAGNOSIS — Z5112 Encounter for antineoplastic immunotherapy: Secondary | ICD-10-CM | POA: Diagnosis not present

## 2014-06-01 MED ORDER — SODIUM CHLORIDE 0.9 % IV SOLN
Freq: Once | INTRAVENOUS | Status: AC
  Administered 2014-06-01: 10:00:00 via INTRAVENOUS

## 2014-06-01 MED ORDER — SODIUM CHLORIDE 0.9 % IV SOLN
Freq: Once | INTRAVENOUS | Status: AC
  Administered 2014-06-01: 10:00:00 via INTRAVENOUS
  Filled 2014-06-01: qty 50

## 2014-06-01 MED ORDER — LORAZEPAM 2 MG/ML IJ SOLN
0.5000 mg | Freq: Once | INTRAMUSCULAR | Status: AC
  Administered 2014-06-01: 0.5 mg via INTRAVENOUS

## 2014-06-01 MED ORDER — ACETAMINOPHEN 325 MG PO TABS
ORAL_TABLET | ORAL | Status: AC
  Filled 2014-06-01: qty 2

## 2014-06-01 MED ORDER — DEXAMETHASONE SODIUM PHOSPHATE 20 MG/5ML IJ SOLN: 20.0000 mg | Freq: Once | INTRAMUSCULAR | Status: DC

## 2014-06-01 MED ORDER — LORAZEPAM 2 MG/ML IJ SOLN
INTRAMUSCULAR | Status: AC
  Filled 2014-06-01: qty 1

## 2014-06-01 MED ORDER — ACETAMINOPHEN 325 MG PO TABS
650.0000 mg | ORAL_TABLET | Freq: Once | ORAL | Status: AC
  Administered 2014-06-01: 650 mg via ORAL

## 2014-06-01 MED ORDER — PROMETHAZINE HCL 25 MG/ML IJ SOLN
25.0000 mg | Freq: Once | INTRAMUSCULAR | Status: DC
  Filled 2014-06-01: qty 1

## 2014-06-01 MED ORDER — SODIUM CHLORIDE 0.9 % IV SOLN
20.0000 mg | Freq: Once | INTRAVENOUS | Status: AC
  Administered 2014-06-01: 20 mg via INTRAVENOUS
  Filled 2014-06-01: qty 2

## 2014-06-01 MED ORDER — SODIUM CHLORIDE 0.9 % IV SOLN
375.0000 mg/m2 | Freq: Once | INTRAVENOUS | Status: AC
  Administered 2014-06-01: 700 mg via INTRAVENOUS
  Filled 2014-06-01: qty 70

## 2014-06-01 NOTE — Patient Instructions (Signed)
[facility] Discharge Instructions for Patients Receiving Chemotherapy  Today you received the following chemotherapy agents:  Rituxan.  To help prevent nausea and vomiting after your treatment, we encourage you to take your nausea medication as ordered per MD.   If you develop nausea and vomiting that is not controlled by your nausea medication, call the clinic.   BELOW ARE SYMPTOMS THAT SHOULD BE REPORTED IMMEDIATELY:  *FEVER GREATER THAN 100.5 F  *CHILLS WITH OR WITHOUT FEVER  NAUSEA AND VOMITING THAT IS NOT CONTROLLED WITH YOUR NAUSEA MEDICATION  *UNUSUAL SHORTNESS OF BREATH  *UNUSUAL BRUISING OR BLEEDING  TENDERNESS IN MOUTH AND THROAT WITH OR WITHOUT PRESENCE OF ULCERS  *URINARY PROBLEMS  *BOWEL PROBLEMS  UNUSUAL RASH Items with * indicate a potential emergency and should be followed up as soon as possible.  Feel free to call the clinic you have any questions or concerns. The clinic phone number is [phone].  Please show the CHEMO ALERT CARD at check-in to the Emergency Department and triage nurse.   

## 2014-06-05 ENCOUNTER — Ambulatory Visit (HOSPITAL_COMMUNITY)
Admission: RE | Admit: 2014-06-05 | Discharge: 2014-06-05 | Disposition: A | Discharge status: Home / Self Care | Payer: 59 | Source: Ambulatory Visit | Attending: Hematology and Oncology | Admitting: Hematology and Oncology

## 2014-06-05 DIAGNOSIS — C8590 Non-Hodgkin lymphoma, unspecified, unspecified site: Secondary | ICD-10-CM | POA: Diagnosis not present

## 2014-06-05 LAB — GLUCOSE, CAPILLARY: GLUCOSE-CAPILLARY: 93 mg/dL (ref 65–99)

## 2014-06-05 MED ORDER — FLUDEOXYGLUCOSE F - 18 (FDG) INJECTION
7.6000 | Freq: Once | INTRAVENOUS | Status: AC | PRN
  Administered 2014-06-05: 7.6 via INTRAVENOUS

## 2014-06-06 ENCOUNTER — Telehealth: Payer: Self-pay | Admitting: Hematology and Oncology

## 2014-06-06 ENCOUNTER — Telehealth: Payer: Self-pay | Admitting: *Deleted

## 2014-06-06 NOTE — Telephone Encounter (Signed)
returned call and s.w. pt and confirmed appt.....pt oka dn aware °

## 2014-06-06 NOTE — Telephone Encounter (Signed)
TC from patient regarding referral to nephrologist. Medical records fax'd to [facility] On 05/25/14. Provide phone # to [facility] [phone]). Patient also asking if Dr. Alvy Bimler will let her know results of PET scan done yesterday, when pt comes in for chemo on Friday, 06/08/14.  Informed pt that Dr. Alvy Bimler is planning on seeing her in the treatment room that day. Pt then wanted to reschedule another appt and transferred call to Hassan Rowan in Scheduling.

## 2014-06-07 ENCOUNTER — Encounter: Payer: Self-pay | Admitting: Internal Medicine

## 2014-06-08 ENCOUNTER — Ambulatory Visit (HOSPITAL_BASED_OUTPATIENT_CLINIC_OR_DEPARTMENT_OTHER): Payer: 59 | Admitting: Hematology and Oncology

## 2014-06-08 ENCOUNTER — Encounter: Payer: Self-pay | Admitting: Hematology and Oncology

## 2014-06-08 ENCOUNTER — Telehealth: Payer: Self-pay | Admitting: Hematology and Oncology

## 2014-06-08 ENCOUNTER — Ambulatory Visit (HOSPITAL_BASED_OUTPATIENT_CLINIC_OR_DEPARTMENT_OTHER): Payer: 59

## 2014-06-08 VITALS — BP 101/53 | HR 62 | Temp 97.7°F | Resp 16

## 2014-06-08 DIAGNOSIS — C8588 Other specified types of non-Hodgkin lymphoma, lymph nodes of multiple sites: Secondary | ICD-10-CM | POA: Diagnosis not present

## 2014-06-08 DIAGNOSIS — R1011 Right upper quadrant pain: Secondary | ICD-10-CM

## 2014-06-08 DIAGNOSIS — R600 Localized edema: Secondary | ICD-10-CM | POA: Diagnosis not present

## 2014-06-08 DIAGNOSIS — Z5112 Encounter for antineoplastic immunotherapy: Secondary | ICD-10-CM | POA: Diagnosis not present

## 2014-06-08 DIAGNOSIS — C8590 Non-Hodgkin lymphoma, unspecified, unspecified site: Secondary | ICD-10-CM

## 2014-06-08 MED ORDER — SODIUM CHLORIDE 0.9 % IV SOLN
Freq: Once | INTRAVENOUS | Status: AC
  Administered 2014-06-08: 11:00:00 via INTRAVENOUS
  Filled 2014-06-08: qty 50

## 2014-06-08 MED ORDER — PROMETHAZINE HCL 25 MG/ML IJ SOLN: 25.0000 mg | Freq: Once | INTRAMUSCULAR | Status: DC

## 2014-06-08 MED ORDER — SODIUM CHLORIDE 0.9 % IV SOLN
375.0000 mg/m2 | Freq: Once | INTRAVENOUS | Status: AC
  Administered 2014-06-08: 700 mg via INTRAVENOUS
  Filled 2014-06-08: qty 70

## 2014-06-08 MED ORDER — ACETAMINOPHEN 325 MG PO TABS
650.0000 mg | ORAL_TABLET | Freq: Once | ORAL | Status: AC
  Administered 2014-06-08: 650 mg via ORAL

## 2014-06-08 MED ORDER — LORAZEPAM 2 MG/ML IJ SOLN
0.5000 mg | Freq: Once | INTRAMUSCULAR | Status: AC
  Administered 2014-06-08: 0.5 mg via INTRAVENOUS

## 2014-06-08 MED ORDER — SODIUM CHLORIDE 0.9 % IV SOLN
Freq: Once | INTRAVENOUS | Status: AC
  Administered 2014-06-08: 10:00:00 via INTRAVENOUS

## 2014-06-08 MED ORDER — DEXAMETHASONE SODIUM PHOSPHATE 100 MG/10ML IJ SOLN
20.0000 mg | Freq: Once | INTRAMUSCULAR | Status: AC
  Administered 2014-06-08: 20 mg via INTRAVENOUS
  Filled 2014-06-08: qty 2

## 2014-06-08 NOTE — Progress Notes (Signed)
[facility] OFFICE PROGRESS NOTE  Patient Care Team: Velna Hatchet, MD as PCP - General (Internal Medicine)  SUMMARY OF ONCOLOGIC HISTORY: Oncology History   Non-Hodgkin lymphoma, marginal zone lymphoma   Primary site: Lymphoid Neoplasms   Staging method: AJCC 6th Edition   Clinical: Stage IV signed by Heath Lark, MD on 05/30/2013  8:47 AM   Summary: Stage IV       Non-Hodgkin lymphoma   02/23/2013 Imaging CT scan show significant enlargement of liver and spleen   05/05/2013 Bone Marrow Biopsy Bone marrow aspirate and biopsy confirmed low grade lymphoma, suspect marginal zone lymphoma   05/08/2013 Imaging PET scan show involvement of liver and spleen   06/02/2013 - 06/23/2013 Chemotherapy She was started on weekly rituximab.   09/04/2013 Imaging PET CT scan show complete response to treatment with splenic size reduced back to normal limits. She has persistent mild thrombocytopenia.   12/06/2013 Imaging PET scan was negative and spleen size is normal   06/06/2014 Imaging PEt scan is negative    INTERVAL HISTORY: Please see below for problem oriented charting. She is seen at infusion center. She continues of intermittent leg edema. She complained of dizziness with spironolactone and a had discontinued it. She continues to have intermittent abdominal pain that comes and goes.  REVIEW OF SYSTEMS:   Constitutional: Denies fevers, chills or abnormal weight loss Eyes: Denies blurriness of vision Ears, nose, mouth, throat, and face: Denies mucositis or sore throat Respiratory: Denies cough, dyspnea or wheezes Cardiovascular: Denies palpitation, chest discomfort or lower extremity swelling Gastrointestinal:  Denies nausea, heartburn or change in bowel habits Skin: Denies abnormal skin rashes Lymphatics: Denies new lymphadenopathy or easy bruising Neurological:Denies numbness, tingling or new weaknesses Behavioral/Psych: Mood is stable, no new changes  All other systems were  reviewed with the patient and are negative.  I have reviewed the past medical history, past surgical history, social history and family history with the patient and they are unchanged from previous note.  ALLERGIES:  is allergic to biaxin; erythromycin; soy allergy; and zithromax.  MEDICATIONS:  Current Outpatient Prescriptions  Medication Sig Dispense Refill  . estradiol (ESTRACE) 2 MG tablet Take 2 mg by mouth every evening.     . Fe Fum-FePoly-Vit C-Vit B3 (INTEGRA) 62.5-62.5-40-3 MG CAPS Take 1 capsule by mouth daily. 30 capsule 1  . folic acid (FOLVITE) 1 MG tablet TAKE 1 TABLET (1 MG TOTAL) BY MOUTH DAILY. 30 tablet PRN  . Linaclotide (LINZESS) 290 MCG CAPS capsule Take 1 capsule (290 mcg total) by mouth every morning. 30 capsule 5  . metoCLOPramide (REGLAN) 5 MG tablet TAKE 1 TABLET BY MOUTH 2 (TWO) TIMES DAILY AT 10 AM AND 4 PM. 180 tablet 1  . progesterone (PROMETRIUM) 100 MG capsule 100 mg. For 12 days after Estradiol    . spironolactone (ALDACTONE) 25 MG tablet Take 1 tablet (25 mg total) by mouth daily. 30 tablet 3  . temazepam (RESTORIL) 30 MG capsule Take 2 capsules (60 mg total) by mouth at bedtime. 60 capsule 1  . traZODone (DESYREL) 50 MG tablet Take 50 mg by mouth at bedtime.     No current facility-administered medications for this visit.   Facility-Administered Medications Ordered in Other Visits  Medication Dose Route Frequency Provider Last Rate Last Dose  . riTUXimab (RITUXAN) 700 mg in sodium chloride 0.9 % 180 mL chemo infusion  375 mg/m2 (Treatment Plan Actual) Intravenous Once Heath Lark, MD        PHYSICAL EXAMINATION:  ECOG PERFORMANCE STATUS: 0 - Asymptomatic  GENERAL:alert, no distress and comfortable SKIN: skin color, texture, turgor are normal, no rashes or significant lesions EYES: normal, Conjunctiva are pink and non-injected, sclera clear Musculoskeletal:no cyanosis of digits and no clubbing  NEURO: alert & oriented x 3 with fluent speech, no focal  motor/sensory deficits  LABORATORY DATA:  I have reviewed the data as listed    Component Value Date/Time   NA 140 05/25/2014 0808   NA 139 05/05/2013 0730   K 3.8 05/25/2014 0808   K 3.9 05/05/2013 0730   CL 101 05/05/2013 0730   CO2 25 05/25/2014 0808   CO2 26 05/05/2013 0730   GLUCOSE 72 05/25/2014 0808   GLUCOSE 83 05/05/2013 0730   BUN 23.6 05/25/2014 0808   BUN 19 05/05/2013 0730   CREATININE 1.2* 05/25/2014 0808   CREATININE 1.51* 05/05/2013 0730   CALCIUM 9.4 05/25/2014 0808   CALCIUM 9.6 05/05/2013 0730   PROT 6.0* 05/25/2014 0808   PROT 6.4 05/05/2013 0730   ALBUMIN 4.0 05/25/2014 0808   ALBUMIN 4.0 05/05/2013 0730   AST 14 05/25/2014 0808   AST 23 05/05/2013 0730   ALT 11 05/25/2014 0808   ALT 17 05/05/2013 0730   ALKPHOS 129 05/25/2014 0808   ALKPHOS 100 05/05/2013 0730   BILITOT 0.43 05/25/2014 0808   BILITOT 0.5 05/05/2013 0730   GFRNONAA 35* 05/05/2013 0730   GFRAA 41* 05/05/2013 0730    No results found for: SPEP, UPEP  Lab Results  Component Value Date   WBC 6.6 05/25/2014   NEUTROABS 4.3 05/25/2014   HGB 12.3 05/25/2014   HCT 35.3 05/25/2014   MCV 88.5 05/25/2014   PLT 110* 05/25/2014      Chemistry      Component Value Date/Time   NA 140 05/25/2014 0808   NA 139 05/05/2013 0730   K 3.8 05/25/2014 0808   K 3.9 05/05/2013 0730   CL 101 05/05/2013 0730   CO2 25 05/25/2014 0808   CO2 26 05/05/2013 0730   BUN 23.6 05/25/2014 0808   BUN 19 05/05/2013 0730   CREATININE 1.2* 05/25/2014 0808   CREATININE 1.51* 05/05/2013 0730      Component Value Date/Time   CALCIUM 9.4 05/25/2014 0808   CALCIUM 9.6 05/05/2013 0730   ALKPHOS 129 05/25/2014 0808   ALKPHOS 100 05/05/2013 0730   AST 14 05/25/2014 0808   AST 23 05/05/2013 0730   ALT 11 05/25/2014 0808   ALT 17 05/05/2013 0730   BILITOT 0.43 05/25/2014 0808   BILITOT 0.5 05/05/2013 0730       RADIOGRAPHIC STUDIES: I reviewed the PET CT scan with the patient. I have personally  reviewed the radiological images as listed and agreed with the findings in the report.    ASSESSMENT & PLAN:  Non-Hodgkin lymphoma She has intermittent right upper quadrant pain which is new.  Repeat PET CT scan showed no evidence of disease. I recommend observation only. After next week, she will return again at the end of the year for final treatment.   Bilateral lower extremity edema She has intermittent bilateral lower extremity edema which I think is attributed by chronic kidney disease. Previously, she did not tolerate Lasix at because significant hypokalemia. I recommend a trial of potassium sparing agent such as spironolactone but in the long run I recommend follow-up with nephrologist. She did not tolerate spironolactone. At this point in time, I recommend just follow-up with nephrologist. Appointment is pending.  No orders of the defined types were placed in this encounter.   All questions were answered. The patient knows to call the clinic with any problems, questions or concerns. No barriers to learning was detected. I spent 15 minutes counseling the patient face to face. The total time spent in the appointment was 20 minutes and more than 50% was on counseling and review of test results     [facility], [place], MD 06/08/2014 10:57 AM

## 2014-06-08 NOTE — Assessment & Plan Note (Signed)
She has intermittent bilateral lower extremity edema which I think is attributed by chronic kidney disease. Previously, she did not tolerate Lasix at because significant hypokalemia. I recommend a trial of potassium sparing agent such as spironolactone but in the long run I recommend follow-up with nephrologist. She did not tolerate spironolactone. At this point in time, I recommend just follow-up with nephrologist. Appointment is pending.

## 2014-06-08 NOTE — Telephone Encounter (Signed)
lvm for pt regarding to May and OCT appt.Marland KitchenMarland KitchenMarland Kitchen

## 2014-06-08 NOTE — Patient Instructions (Signed)
[facility] Discharge Instructions for Patients Receiving Chemotherapy  Today you received the following chemotherapy agents Rituxan To help prevent nausea and vomiting after your treatment, we encourage you to take your nausea medication as prescribed.  If you develop nausea and vomiting that is not controlled by your nausea medication, call the clinic.   BELOW ARE SYMPTOMS THAT SHOULD BE REPORTED IMMEDIATELY:  *FEVER GREATER THAN 100.5 F  *CHILLS WITH OR WITHOUT FEVER  NAUSEA AND VOMITING THAT IS NOT CONTROLLED WITH YOUR NAUSEA MEDICATION  *UNUSUAL SHORTNESS OF BREATH  *UNUSUAL BRUISING OR BLEEDING  TENDERNESS IN MOUTH AND THROAT WITH OR WITHOUT PRESENCE OF ULCERS  *URINARY PROBLEMS  *BOWEL PROBLEMS  UNUSUAL RASH Items with * indicate a potential emergency and should be followed up as soon as possible.  Feel free to call the clinic you have any questions or concerns. The clinic phone number is [phone].  Please show the CHEMO ALERT CARD at check-in to the Emergency Department and triage nurse.   

## 2014-06-08 NOTE — Assessment & Plan Note (Signed)
She has intermittent right upper quadrant pain which is new.  Repeat PET CT scan showed no evidence of disease. I recommend observation only. After next week, she will return again at the end of the year for final treatment.

## 2014-06-15 ENCOUNTER — Ambulatory Visit (HOSPITAL_BASED_OUTPATIENT_CLINIC_OR_DEPARTMENT_OTHER): Payer: 59

## 2014-06-15 VITALS — BP 103/56 | HR 73 | Temp 98.3°F | Resp 17

## 2014-06-15 DIAGNOSIS — Z5112 Encounter for antineoplastic immunotherapy: Secondary | ICD-10-CM | POA: Diagnosis not present

## 2014-06-15 DIAGNOSIS — C8590 Non-Hodgkin lymphoma, unspecified, unspecified site: Secondary | ICD-10-CM | POA: Diagnosis not present

## 2014-06-15 MED ORDER — SODIUM CHLORIDE 0.9 % IV SOLN
Freq: Once | INTRAVENOUS | Status: AC
  Administered 2014-06-15: 10:00:00 via INTRAVENOUS

## 2014-06-15 MED ORDER — PROMETHAZINE HCL 25 MG/ML IJ SOLN
25.0000 mg | Freq: Once | INTRAMUSCULAR | Status: DC
  Filled 2014-06-15: qty 1

## 2014-06-15 MED ORDER — SODIUM CHLORIDE 0.9 % IV SOLN
Freq: Once | INTRAVENOUS | Status: AC
  Administered 2014-06-15: 10:00:00 via INTRAVENOUS
  Filled 2014-06-15: qty 50

## 2014-06-15 MED ORDER — ACETAMINOPHEN 325 MG PO TABS
ORAL_TABLET | ORAL | Status: AC
  Filled 2014-06-15: qty 2

## 2014-06-15 MED ORDER — SODIUM CHLORIDE 0.9 % IV SOLN
20.0000 mg | Freq: Once | INTRAVENOUS | Status: AC
  Administered 2014-06-15: 20 mg via INTRAVENOUS
  Filled 2014-06-15: qty 2

## 2014-06-15 MED ORDER — LORAZEPAM 2 MG/ML IJ SOLN
INTRAMUSCULAR | Status: AC
  Filled 2014-06-15: qty 1

## 2014-06-15 MED ORDER — LORAZEPAM 2 MG/ML IJ SOLN
0.5000 mg | Freq: Once | INTRAMUSCULAR | Status: AC
  Administered 2014-06-15: 0.5 mg via INTRAVENOUS

## 2014-06-15 MED ORDER — SODIUM CHLORIDE 0.9 % IV SOLN
375.0000 mg/m2 | Freq: Once | INTRAVENOUS | Status: AC
  Administered 2014-06-15: 700 mg via INTRAVENOUS
  Filled 2014-06-15: qty 70

## 2014-06-15 MED ORDER — ACETAMINOPHEN 325 MG PO TABS
650.0000 mg | ORAL_TABLET | Freq: Once | ORAL | Status: AC
  Administered 2014-06-15: 650 mg via ORAL

## 2014-06-15 NOTE — Patient Instructions (Signed)
[facility] Discharge Instructions for Patients Receiving Chemotherapy  Today you received the following chemotherapy agents: Rituxan  To help prevent nausea and vomiting after your treatment, we encourage you to take your nausea medication as prescribed by your physician.   If you develop nausea and vomiting that is not controlled by your nausea medication, call the clinic.   BELOW ARE SYMPTOMS THAT SHOULD BE REPORTED IMMEDIATELY:  *FEVER GREATER THAN 100.5 F  *CHILLS WITH OR WITHOUT FEVER  NAUSEA AND VOMITING THAT IS NOT CONTROLLED WITH YOUR NAUSEA MEDICATION  *UNUSUAL SHORTNESS OF BREATH  *UNUSUAL BRUISING OR BLEEDING  TENDERNESS IN MOUTH AND THROAT WITH OR WITHOUT PRESENCE OF ULCERS  *URINARY PROBLEMS  *BOWEL PROBLEMS  UNUSUAL RASH Items with * indicate a potential emergency and should be followed up as soon as possible.  Feel free to call the clinic you have any questions or concerns. The clinic phone number is [phone].  Please show the CHEMO ALERT CARD at check-in to the Emergency Department and triage nurse.   

## 2014-06-21 ENCOUNTER — Other Ambulatory Visit: Payer: 59

## 2014-06-22 ENCOUNTER — Ambulatory Visit: Payer: 59

## 2014-06-22 ENCOUNTER — Ambulatory Visit: Payer: 59 | Admitting: Hematology and Oncology

## 2014-06-25 ENCOUNTER — Ambulatory Visit: Payer: 59 | Admitting: Hematology and Oncology

## 2014-06-27 ENCOUNTER — Telehealth: Payer: Self-pay | Admitting: Internal Medicine

## 2014-06-27 NOTE — Telephone Encounter (Signed)
Sure

## 2014-06-27 NOTE — Telephone Encounter (Signed)
Patient is calling to request a transfer of care from Dr. Henrene Pastor to Dr. Ardis Hughs. She says when she last saw Dr. Henrene Pastor it was decided that he had done all he could for her. She then went on to see Dr. Watt Climes and its been over a year since she's seen him. She would now like to see Dr. Ardis Hughs.

## 2014-06-27 NOTE — Telephone Encounter (Signed)
Dr. Ardis Hughs will you accept this pt? Please advise.

## 2014-06-27 NOTE — Telephone Encounter (Signed)
Pt would like to switch care from Dr. Henrene Pastor to Dr. Ardis Hughs. States the last time she saw Dr. Henrene Pastor he told her there was nothing else he could offer her. Please advise.

## 2014-06-28 NOTE — Telephone Encounter (Signed)
Please schedule pt to see Dr. Ardis Hughs, see note below.

## 2014-06-28 NOTE — Telephone Encounter (Signed)
In the past 4-5 years she has seen Dr. Henrene Pastor, Dr. Derrill Kay (at [facility]), Dr. Watt Climes and now would like a new provider.  Really seems to be 'Dr. Lisette Grinder' but I am happy to see her (will need new gi appt, my next available, do not double book) she was last seen here in 2012 I believe.

## 2014-06-29 ENCOUNTER — Other Ambulatory Visit: Payer: 59

## 2014-06-29 ENCOUNTER — Ambulatory Visit: Payer: 59 | Admitting: Hematology and Oncology

## 2014-06-29 ENCOUNTER — Ambulatory Visit: Payer: 59

## 2014-07-04 ENCOUNTER — Other Ambulatory Visit (HOSPITAL_COMMUNITY): Payer: Self-pay | Admitting: Obstetrics & Gynecology

## 2014-07-04 DIAGNOSIS — Z1231 Encounter for screening mammogram for malignant neoplasm of breast: Secondary | ICD-10-CM

## 2014-07-04 NOTE — Telephone Encounter (Signed)
Called patient to inform her of the switch. She understood and will call to schedule with Dr. Ardis Hughs whenever she is ready.

## 2014-07-06 ENCOUNTER — Ambulatory Visit: Payer: 59

## 2014-07-13 ENCOUNTER — Ambulatory Visit: Payer: 59

## 2014-07-17 ENCOUNTER — Telehealth: Payer: Self-pay | Admitting: Internal Medicine

## 2014-07-17 ENCOUNTER — Telehealth: Payer: Self-pay | Admitting: Gastroenterology

## 2014-07-17 NOTE — Telephone Encounter (Signed)
Pt has been scheduled to see Nevin Bloodgood on 08/08/14 at 3 pm to discuss EGD

## 2014-07-17 NOTE — Telephone Encounter (Signed)
Patient switched to Dr. Ardis Hughs.

## 2014-07-17 NOTE — Telephone Encounter (Signed)
A user error has taken place.

## 2014-08-01 ENCOUNTER — Encounter: Payer: Self-pay | Admitting: Hematology and Oncology

## 2014-08-01 NOTE — Progress Notes (Signed)
Pt renewed her grant with [employer] for Rituxan.  Her new effective dates are 07/25/14 to 07/24/15.  The amount of the grant is $25,000.  I forwarded copy of letter to Arline Asp in the billing dept.

## 2014-08-02 ENCOUNTER — Ambulatory Visit (HOSPITAL_COMMUNITY): Payer: 59

## 2014-08-08 ENCOUNTER — Ambulatory Visit (INDEPENDENT_AMBULATORY_CARE_PROVIDER_SITE_OTHER): Payer: 59 | Admitting: Nurse Practitioner

## 2014-08-08 ENCOUNTER — Encounter: Payer: Self-pay | Admitting: Nurse Practitioner

## 2014-08-08 VITALS — BP 100/60 | HR 60 | Ht 67.0 in | Wt 152.1 lb

## 2014-08-08 DIAGNOSIS — R1310 Dysphagia, unspecified: Secondary | ICD-10-CM

## 2014-08-08 DIAGNOSIS — K625 Hemorrhage of anus and rectum: Secondary | ICD-10-CM

## 2014-08-08 NOTE — Progress Notes (Signed)
HPI :   Patient is a 64 year old female, known to Dr. Henrene Pastor. She has requested to transfer care to Dr. Ardis Hughs. She is here with a couple of GI problems. First, patient has a history of esophageal strictures requiring dilations, last one in 2011.Shannon Barajas She has been having solid food dysphagia for the last two months. Vomiting undigested food.    Patient was diagnosed with stage IV non-hodgkin lymphoma last Spring.  She has been undergoing treatment and  PET in May of this year was negative. Patient complains of chronic hemorrhoidal bleeding, worse since starting chemo. She has chronic constipation but manages it with Linzess. Her last colonoscopy in 2011 revealed diverticulosis and internal hemorrhoids  Past Medical History  Diagnosis Date  . IBS (irritable bowel syndrome)   . Raynaud's syndrome   . Hemorrhoids   . Splenomegaly   . Hiatal hernia     small   . Stricture and stenosis of esophagus   . GERD (gastroesophageal reflux disease)   . H/O: iron deficiency anemia   . Thrombocytopenia, unspecified   . Insomnia, unspecified   . Chronic interstitial cystitis   . Diverticulosis     mild   . Interstitial cystitis   . Chronic kidney disease (CKD), stage II (mild) 05/25/2014  . Non Hodgkin's lymphoma 04/19/2013    Family History  Problem Relation Age of Onset  . Diabetes Maternal Grandmother   . Colon cancer Maternal Grandfather   . Cancer Maternal Grandfather     liver ca ?  Shannon Barajas Lymphoma Father     lymphoma  . Cirrhosis Father     heavy drinker   History  Substance Use Topics  . Smoking status: Never Smoker   . Smokeless tobacco: Never Used  . Alcohol Use: No   Current Outpatient Prescriptions  Medication Sig Dispense Refill  . estradiol (ESTRACE) 2 MG tablet Take 2 mg by mouth every evening.     . Fe Fum-FePoly-Vit C-Vit B3 (INTEGRA) 62.5-62.5-40-3 MG CAPS Take 1 capsule by mouth daily. 30 capsule 1  . folic acid (FOLVITE) 1 MG tablet TAKE 1 TABLET (1 MG TOTAL) BY MOUTH  DAILY. 30 tablet PRN  . Linaclotide (LINZESS) 290 MCG CAPS capsule Take 1 capsule (290 mcg total) by mouth every morning. 30 capsule 5  . metoCLOPramide (REGLAN) 5 MG tablet TAKE 1 TABLET BY MOUTH 2 (TWO) TIMES DAILY AT 10 AM AND 4 PM. 180 tablet 1  . progesterone (PROMETRIUM) 100 MG capsule 100 mg. For 12 days after Estradiol    . temazepam (RESTORIL) 30 MG capsule Take 2 capsules (60 mg total) by mouth at bedtime. 60 capsule 1  . traZODone (DESYREL) 50 MG tablet Take 50 mg by mouth at bedtime.     No current facility-administered medications for this visit.   Allergies  Allergen Reactions  . Biaxin [Clarithromycin]     REACTION: aggrevates IBS  . Erythromycin     REACTION: aggrevates IBS  . Soy Allergy     May arregevate interstitial cystitis  . Zithromax [Azithromycin]      Review of Systems: All systems reviewed and negative except where noted in HPI.    Physical Exam: BP 100/60 mmHg  Pulse 60  Ht 5\' 7"  (1.702 m)  Wt 152 lb 2 oz (69.003 kg)  BMI 23.82 kg/m2 Constitutional: Pleasant,well-developed, white female in no acute distress. HEENT: Normocephalic and atraumatic. Conjunctivae are normal. No scleral icterus. Neck supple.  Cardiovascular: Normal rate, regular rhythm.  Pulmonary/chest: Effort normal and  breath sounds normal. No wheezing, rales or rhonchi. Abdominal: Soft, nondistended, nontender. Bowel sounds active throughout.  Rectal: external hemorrhoids. Protruding internal hemorrhoid Extremities: no edema Lymphadenopathy: No cervical adenopathy noted. Neurological: Alert and oriented to person place and time. Skin: Skin is warm and dry. No rashes noted. Psychiatric: Normal mood and affect. Behavior is normal.   ASSESSMENT AND PLAN:  40. 64 year old female with chronic painless rectal bleeding, worse since starting chemo several months ago. She had hemorrhoids on colonoscopy in 2011 and she still has them now. We discussed banding. She is extremely hesitant to  have any hemorrhoidal procedure without sedation. I suggested surgical evaluation, she may contact Dr. Marcello Moores. In the meantime, trial of steroid cream at bedtime for one week.   2. Recurrent solid food dysphagia. She had a large caliber ring like stricture in distal esophagus in 2011. Stricture injected with triamcinolone and balloon dilated. Rule out recurrent stricture. Low suspicion for candida esophagitis or other etiology of dysphagia. Patient will be scheduled for EGD at hospital. The benefits, risks, and potential complications of EGD with possible biopsies and/or dilation were discussed with the patient and she agrees to proceed.    3. Stage IV NHL diagnosed last year. Undergoing treatment, repeat PET in May showed no disease.

## 2014-08-08 NOTE — Patient Instructions (Signed)
We will call you once we have a date for the Endoscopy.  Dr. Ardis Hughs will be back Monday 08-13-2014. Dr Ardis Hughs will do the procedure. Delainy Mcelhiney will call you with the instructions.

## 2014-08-10 ENCOUNTER — Telehealth: Payer: Self-pay | Admitting: Nurse Practitioner

## 2014-08-10 MED ORDER — HYDROCORTISONE 2.5 % RE CREA: 1.0000 "application " | TOPICAL_CREAM | Freq: Every evening | RECTAL | Status: DC | PRN

## 2014-08-10 NOTE — Telephone Encounter (Signed)
Refill sent   Patient notified

## 2014-08-11 ENCOUNTER — Encounter: Payer: Self-pay | Admitting: Nurse Practitioner

## 2014-08-11 DIAGNOSIS — R1310 Dysphagia, unspecified: Secondary | ICD-10-CM | POA: Insufficient documentation

## 2014-08-11 NOTE — Progress Notes (Signed)
Ok to scheduled for EGD with dilation, at [facility] with MAC sedation, next available EUS Thursday for me.  Plan balloon dilation if stricture is noted.

## 2014-08-13 ENCOUNTER — Telehealth: Payer: Self-pay

## 2014-08-13 NOTE — Telephone Encounter (Signed)
-----   Message from [name], MD sent at 08/11/2014  6:35 PM EDT -----   ----- Message -----    From: Willia Craze, NP    Sent: 08/11/2014  12:49 AM      To: [name], MD

## 2014-08-13 NOTE — Telephone Encounter (Signed)
Ok to scheduled for EGD with dilation, at [facility] with MAC sedation, next available EUS Thursday for me. Plan balloon dilation if stricture is noted.

## 2014-08-14 ENCOUNTER — Other Ambulatory Visit: Payer: Self-pay

## 2014-08-14 DIAGNOSIS — K222 Esophageal obstruction: Secondary | ICD-10-CM

## 2014-08-14 NOTE — Telephone Encounter (Signed)
EGD scheduled, pt instructed and medications reviewed.  Patient instructions in My Chart.  Patient to call with any questions or concerns.

## 2014-08-31 ENCOUNTER — Telehealth: Payer: Self-pay | Admitting: Hematology and Oncology

## 2014-08-31 NOTE — Telephone Encounter (Signed)
Faxed pt medical records to [place].

## 2014-09-03 ENCOUNTER — Encounter (HOSPITAL_COMMUNITY): Payer: Self-pay | Admitting: *Deleted

## 2014-09-05 NOTE — Anesthesia Preprocedure Evaluation (Addendum)
Anesthesia Evaluation  Patient identified by MRN, date of birth, ID band Patient awake    Reviewed: Allergy & Precautions, NPO status , Patient's Chart, lab work & pertinent test results  Airway Mallampati: III  TM Distance: <3 FB Neck ROM: Full    Dental no notable dental hx. (+) Teeth Intact, Dental Advisory Given   Pulmonary neg pulmonary ROS,  breath sounds clear to auscultation  Pulmonary exam normal       Cardiovascular + Peripheral Vascular Disease Normal cardiovascular examRhythm:Regular Rate:Normal     Neuro/Psych negative neurological ROS  negative psych ROS   GI/Hepatic negative GI ROS, Neg liver ROS, hiatal hernia, GERD-  Medicated,Rectal bleeding   Endo/Other  negative endocrine ROS  Renal/GU Renal InsufficiencyRenal disease     Musculoskeletal negative musculoskeletal ROS (+)   Abdominal   Peds  Hematology  (+) anemia ,   Anesthesia Other Findings Day of surgery medications reviewed with the patient.  Reproductive/Obstetrics                            Anesthesia Physical Anesthesia Plan  ASA: III  Anesthesia Plan: MAC   Post-op Pain Management:    Induction: Intravenous  Airway Management Planned: Nasal Cannula  Additional Equipment:   Intra-op Plan:   Post-operative Plan:   Informed Consent: I have reviewed the patients History and Physical, chart, labs and discussed the procedure including the risks, benefits and alternatives for the proposed anesthesia with the patient or authorized representative who has indicated his/her understanding and acceptance.   Dental advisory given  Plan Discussed with: CRNA and Anesthesiologist  Anesthesia Plan Comments: (Discussed risks/benefits/alternatives to MAC sedation including need for ventilatory support, hypotension, need for conversion to general anesthesia.  All patient questions answered.  Patient wished to proceed.)        Anesthesia Quick Evaluation

## 2014-09-06 ENCOUNTER — Ambulatory Visit (HOSPITAL_COMMUNITY): Payer: 59 | Admitting: Anesthesiology

## 2014-09-06 ENCOUNTER — Encounter (HOSPITAL_COMMUNITY)
Admission: RE | Disposition: A | Discharge status: Home / Self Care | Payer: Self-pay | Source: Ambulatory Visit | Attending: Gastroenterology

## 2014-09-06 ENCOUNTER — Encounter (HOSPITAL_COMMUNITY): Payer: Self-pay | Admitting: *Deleted

## 2014-09-06 ENCOUNTER — Ambulatory Visit (HOSPITAL_COMMUNITY)
Admission: RE | Admit: 2014-09-06 | Discharge: 2014-09-06 | Disposition: A | Discharge status: Home / Self Care | Payer: 59 | Source: Ambulatory Visit | Attending: Gastroenterology | Admitting: Gastroenterology

## 2014-09-06 DIAGNOSIS — Z79899 Other long term (current) drug therapy: Secondary | ICD-10-CM | POA: Diagnosis not present

## 2014-09-06 DIAGNOSIS — K625 Hemorrhage of anus and rectum: Secondary | ICD-10-CM | POA: Insufficient documentation

## 2014-09-06 DIAGNOSIS — K449 Diaphragmatic hernia without obstruction or gangrene: Secondary | ICD-10-CM | POA: Diagnosis not present

## 2014-09-06 DIAGNOSIS — K644 Residual hemorrhoidal skin tags: Secondary | ICD-10-CM | POA: Diagnosis not present

## 2014-09-06 DIAGNOSIS — K5790 Diverticulosis of intestine, part unspecified, without perforation or abscess without bleeding: Secondary | ICD-10-CM | POA: Insufficient documentation

## 2014-09-06 DIAGNOSIS — K648 Other hemorrhoids: Secondary | ICD-10-CM | POA: Diagnosis not present

## 2014-09-06 DIAGNOSIS — N182 Chronic kidney disease, stage 2 (mild): Secondary | ICD-10-CM | POA: Diagnosis not present

## 2014-09-06 DIAGNOSIS — K5900 Constipation, unspecified: Secondary | ICD-10-CM | POA: Diagnosis not present

## 2014-09-06 DIAGNOSIS — C8590 Non-Hodgkin lymphoma, unspecified, unspecified site: Secondary | ICD-10-CM | POA: Insufficient documentation

## 2014-09-06 DIAGNOSIS — Q394 Esophageal web: Secondary | ICD-10-CM

## 2014-09-06 DIAGNOSIS — K219 Gastro-esophageal reflux disease without esophagitis: Secondary | ICD-10-CM | POA: Insufficient documentation

## 2014-09-06 DIAGNOSIS — I739 Peripheral vascular disease, unspecified: Secondary | ICD-10-CM | POA: Diagnosis not present

## 2014-09-06 DIAGNOSIS — R1310 Dysphagia, unspecified: Secondary | ICD-10-CM

## 2014-09-06 DIAGNOSIS — K222 Esophageal obstruction: Secondary | ICD-10-CM | POA: Insufficient documentation

## 2014-09-06 HISTORY — PX: ESOPHAGOGASTRODUODENOSCOPY (EGD) WITH PROPOFOL: SHX5813

## 2014-09-06 HISTORY — PX: BALLOON DILATION: SHX5330

## 2014-09-06 SURGERY — BALLOON DILATION
Anesthesia: Monitor Anesthesia Care

## 2014-09-06 MED ORDER — PROPOFOL 10 MG/ML IV BOLUS
INTRAVENOUS | Status: AC
  Filled 2014-09-06: qty 20

## 2014-09-06 MED ORDER — LACTATED RINGERS IV SOLN
INTRAVENOUS | Status: DC
  Administered 2014-09-06: 1000 mL via INTRAVENOUS

## 2014-09-06 MED ORDER — LIDOCAINE HCL (CARDIAC) 20 MG/ML IV SOLN
INTRAVENOUS | Status: DC | PRN
  Administered 2014-09-06: 50 mg via INTRAVENOUS

## 2014-09-06 MED ORDER — SODIUM CHLORIDE 0.9 % IV SOLN: INTRAVENOUS | Status: DC

## 2014-09-06 MED ORDER — ESOMEPRAZOLE MAGNESIUM 40 MG PO PACK: 40.0000 mg | PACK | Freq: Every day | ORAL | Status: DC

## 2014-09-06 MED ORDER — PROPOFOL INFUSION 10 MG/ML OPTIME
INTRAVENOUS | Status: DC | PRN
  Administered 2014-09-06: 60 ug/kg/min via INTRAVENOUS

## 2014-09-06 MED ORDER — PROPOFOL 500 MG/50ML IV EMUL
INTRAVENOUS | Status: DC | PRN
  Administered 2014-09-06: 40 mg via INTRAVENOUS
  Administered 2014-09-06 (×3): 30 mg via INTRAVENOUS

## 2014-09-06 SURGICAL SUPPLY — 14 items

## 2014-09-06 NOTE — H&P (View-Only) (Signed)
HPI :   Patient is a 64 year old female, known to Dr. Henrene Pastor. She has requested to transfer care to Dr. Ardis Hughs. She is here with a couple of GI problems. First, patient has a history of esophageal strictures requiring dilations, last one in 2011.Marland Kitchen She has been having solid food dysphagia for the last two months. Vomiting undigested food.    Patient was diagnosed with stage IV non-hodgkin lymphoma last Spring.  She has been undergoing treatment and  PET in May of this year was negative. Patient complains of chronic hemorrhoidal bleeding, worse since starting chemo. She has chronic constipation but manages it with Linzess. Her last colonoscopy in 2011 revealed diverticulosis and internal hemorrhoids  Past Medical History  Diagnosis Date  . IBS (irritable bowel syndrome)   . Raynaud's syndrome   . Hemorrhoids   . Splenomegaly   . Hiatal hernia     small   . Stricture and stenosis of esophagus   . GERD (gastroesophageal reflux disease)   . H/O: iron deficiency anemia   . Thrombocytopenia, unspecified   . Insomnia, unspecified   . Chronic interstitial cystitis   . Diverticulosis     mild   . Interstitial cystitis   . Chronic kidney disease (CKD), stage II (mild) 05/25/2014  . Non Hodgkin's lymphoma 04/19/2013    Family History  Problem Relation Age of Onset  . Diabetes Maternal Grandmother   . Colon cancer Maternal Grandfather   . Cancer Maternal Grandfather     liver ca ?  Marland Kitchen Lymphoma Father     lymphoma  . Cirrhosis Father     heavy drinker   History  Substance Use Topics  . Smoking status: Never Smoker   . Smokeless tobacco: Never Used  . Alcohol Use: No   Current Outpatient Prescriptions  Medication Sig Dispense Refill  . estradiol (ESTRACE) 2 MG tablet Take 2 mg by mouth every evening.     . Fe Fum-FePoly-Vit C-Vit B3 (INTEGRA) 62.5-62.5-40-3 MG CAPS Take 1 capsule by mouth daily. 30 capsule 1  . folic acid (FOLVITE) 1 MG tablet TAKE 1 TABLET (1 MG TOTAL) BY MOUTH  DAILY. 30 tablet PRN  . Linaclotide (LINZESS) 290 MCG CAPS capsule Take 1 capsule (290 mcg total) by mouth every morning. 30 capsule 5  . metoCLOPramide (REGLAN) 5 MG tablet TAKE 1 TABLET BY MOUTH 2 (TWO) TIMES DAILY AT 10 AM AND 4 PM. 180 tablet 1  . progesterone (PROMETRIUM) 100 MG capsule 100 mg. For 12 days after Estradiol    . temazepam (RESTORIL) 30 MG capsule Take 2 capsules (60 mg total) by mouth at bedtime. 60 capsule 1  . traZODone (DESYREL) 50 MG tablet Take 50 mg by mouth at bedtime.     No current facility-administered medications for this visit.   Allergies  Allergen Reactions  . Biaxin [Clarithromycin]     REACTION: aggrevates IBS  . Erythromycin     REACTION: aggrevates IBS  . Soy Allergy     May arregevate interstitial cystitis  . Zithromax [Azithromycin]      Review of Systems: All systems reviewed and negative except where noted in HPI.    Physical Exam: BP 100/60 mmHg  Pulse 60  Ht 5\' 7"  (1.702 m)  Wt 152 lb 2 oz (69.003 kg)  BMI 23.82 kg/m2 Constitutional: Pleasant,well-developed, white female in no acute distress. HEENT: Normocephalic and atraumatic. Conjunctivae are normal. No scleral icterus. Neck supple.  Cardiovascular: Normal rate, regular rhythm.  Pulmonary/chest: Effort normal and  breath sounds normal. No wheezing, rales or rhonchi. Abdominal: Soft, nondistended, nontender. Bowel sounds active throughout.  Rectal: external hemorrhoids. Protruding internal hemorrhoid Extremities: no edema Lymphadenopathy: No cervical adenopathy noted. Neurological: Alert and oriented to person place and time. Skin: Skin is warm and dry. No rashes noted. Psychiatric: Normal mood and affect. Behavior is normal.   ASSESSMENT AND PLAN:  65. 64 year old female with chronic painless rectal bleeding, worse since starting chemo several months ago. She had hemorrhoids on colonoscopy in 2011 and she still has them now. We discussed banding. She is extremely hesitant to  have any hemorrhoidal procedure without sedation. I suggested surgical evaluation, she may contact Dr. Marcello Moores. In the meantime, trial of steroid cream at bedtime for one week.   2. Recurrent solid food dysphagia. She had a large caliber ring like stricture in distal esophagus in 2011. Stricture injected with triamcinolone and balloon dilated. Rule out recurrent stricture. Low suspicion for candida esophagitis or other etiology of dysphagia. Patient will be scheduled for EGD at hospital. The benefits, risks, and potential complications of EGD with possible biopsies and/or dilation were discussed with the patient and she agrees to proceed.    3. Stage IV NHL diagnosed last year. Undergoing treatment, repeat PET in May showed no disease.

## 2014-09-06 NOTE — Discharge Instructions (Signed)
Esophagogastroduodenoscopy °Care After °Refer to this sheet in the next few weeks. These instructions provide you with information on caring for yourself after your procedure. Your caregiver may also give you more specific instructions. Your treatment has been planned according to current medical practices, but problems sometimes occur. Call your caregiver if you have any problems or questions after your procedure.  °HOME CARE INSTRUCTIONS °· Do not eat or drink anything until the numbing medicine (local anesthetic) has worn off and your gag reflex has returned. You will know that the local anesthetic has worn off when you can swallow comfortably. °· Do not drive for 12 hours after the procedure or as directed by your caregiver. °· Only take medicines as directed by your caregiver. °SEEK MEDICAL CARE IF:  °· You cannot stop coughing. °· You are not urinating at all or less than usual. °SEEK IMMEDIATE MEDICAL CARE IF: °· You have difficulty swallowing. °· You cannot eat or drink. °· You have worsening throat or chest pain. °· You have dizziness, lightheadedness, or you faint. °· You have nausea or vomiting. °· You have chills. °· You have a fever. °· You have severe abdominal pain. °· You have black, tarry, or bloody stools. °Document Released: 12/23/2011 Document Reviewed: 12/23/2011 °ExitCare® Patient Information ©2015 [employer]. This information is not intended to replace advice given to you by your health care provider. Make sure you discuss any questions you have with your health care provider. ° °

## 2014-09-06 NOTE — Op Note (Addendum)
[place], [id number]   ENDOSCOPY PROCEDURE REPORT  PATIENT: Shannon Barajas, Shannon Barajas  MR#: [id number] BIRTHDATE: [birth year] , 45  yrs. old GENDER: female ENDOSCOPIST: [name], MD PROCEDURE DATE:  09/06/2014 PROCEDURE:  EGD w/ balloon dilation ASA CLASS:     Class II INDICATIONS:  recurrent dysphagia, h/o GE junction 'muscular ring' dilated several times by Dr.  Scarlette Shorts (last was in 2011); stopped PPI a few years ago. MEDICATIONS: Monitored anesthesia care TOPICAL ANESTHETIC: none  DESCRIPTION OF PROCEDURE: After the risks benefits and alternatives of the procedure were thoroughly explained, informed consent was obtained.  The [place] [id number] endoscope was introduced through the mouth and advanced to the second portion of the duodenum , Without limitations.  The instrument was slowly withdrawn as the mucosa was fully examined.  There was a thick Schatzki's ring at the GE junction.  The examination was otherwise normal.  The ring was dilated with TTS balloon held inflated to 29mm for 1 minute.  There was the usual superfical mucosal tear and self limited minor oozing of blood following dilation.  Retroflexed views revealed no abnormalities. The scope was then withdrawn from the patient and the procedure completed. COMPLICATIONS: There were no immediate complications.  ENDOSCOPIC IMPRESSION: There was a thick Schatzki's ring at the GE junction.  The examination was otherwise normal.  The ring was dilated with TTS balloon held inflated to 36mm for 1 minute.  There was the usual superfical mucosal tear and self limited minor oozing of blood following dilation  RECOMMENDATIONS: Please restart PPI (nexium 40mg  was called in to your pharmacty today).  You should take this 20-30 min prior to breakfast or dinner meal daily.  This may decrease the need for futher dilations.  Liquid diet until tomorrow AM. Please call my  office in 4-6 weeks to report on your response to this dilation.  eSigned:  [name], MD 09/06/2014 9:18 AM Revised: 09/06/2014 9:18 AM

## 2014-09-06 NOTE — Transfer of Care (Signed)
Immediate Anesthesia Transfer of Care Note  Patient: Shannon Barajas  Procedure(s) Performed: Procedure(s): ESOPHAGOGASTRODUODENOSCOPY (EGD) WITH PROPOFOL (N/A) BALLOON DILATION (N/A)  Patient Location: PACU  Anesthesia Type:MAC  Level of Consciousness: sedated, patient cooperative and responds to stimulation  Airway & Oxygen Therapy: Patient Spontanous Breathing and Patient connected to nasal cannula oxygen  Post-op Assessment: Report given to RN and Post -op Vital signs reviewed and stable  Post vital signs: Reviewed and stable  Last Vitals:  Filed Vitals:   09/06/14 0750  BP: 105/58  Temp: 36.6 C  Resp: 19    Complications: No apparent anesthesia complications

## 2014-09-06 NOTE — Interval H&P Note (Signed)
History and Physical Interval Note:  09/06/2014 8:53 AM  Shannon Barajas  has presented today for surgery, with the diagnosis of stricture  The various methods of treatment have been discussed with the patient and family. After consideration of risks, benefits and other options for treatment, the patient has consented to  Procedure(s): ESOPHAGOGASTRODUODENOSCOPY (EGD) WITH PROPOFOL (N/A) as a surgical intervention .  The patient's history has been reviewed, patient examined, no change in status, stable for surgery.  I have reviewed the patient's chart and labs.  Questions were answered to the patient's satisfaction.     [name]

## 2014-09-06 NOTE — Anesthesia Postprocedure Evaluation (Signed)
  Anesthesia Post-op Note  Patient: Shannon Barajas  Procedure(s) Performed: Procedure(s) (LRB): ESOPHAGOGASTRODUODENOSCOPY (EGD) WITH PROPOFOL (N/A) BALLOON DILATION (N/A)  Patient Location: PACU  Anesthesia Type: MAC  Level of Consciousness: awake and alert   Airway and Oxygen Therapy: Patient Spontanous Breathing  Post-op Pain: mild  Post-op Assessment: Post-op Vital signs reviewed, Patient's Cardiovascular Status Stable, Respiratory Function Stable, Patent Airway and No signs of Nausea or vomiting  Last Vitals:  Filed Vitals:   09/06/14 0940  BP:   Pulse: 61  Temp:   Resp: 16    Post-op Vital Signs: stable   Complications: No apparent anesthesia complications

## 2014-09-07 ENCOUNTER — Encounter (HOSPITAL_COMMUNITY): Payer: Self-pay | Admitting: Gastroenterology

## 2014-09-25 ENCOUNTER — Other Ambulatory Visit: Payer: Self-pay | Admitting: Obstetrics & Gynecology

## 2014-09-25 DIAGNOSIS — R928 Other abnormal and inconclusive findings on diagnostic imaging of breast: Secondary | ICD-10-CM

## 2014-10-05 ENCOUNTER — Ambulatory Visit
Admission: RE | Admit: 2014-10-05 | Discharge: 2014-10-05 | Disposition: A | Discharge status: Home / Self Care | Payer: 59 | Source: Ambulatory Visit | Attending: Obstetrics & Gynecology | Admitting: Obstetrics & Gynecology

## 2014-10-05 DIAGNOSIS — R928 Other abnormal and inconclusive findings on diagnostic imaging of breast: Secondary | ICD-10-CM

## 2014-11-15 ENCOUNTER — Encounter: Payer: Self-pay | Admitting: Hematology and Oncology

## 2014-11-15 ENCOUNTER — Other Ambulatory Visit (HOSPITAL_BASED_OUTPATIENT_CLINIC_OR_DEPARTMENT_OTHER): Payer: 59

## 2014-11-15 ENCOUNTER — Telehealth: Payer: Self-pay | Admitting: Hematology and Oncology

## 2014-11-15 ENCOUNTER — Ambulatory Visit (HOSPITAL_BASED_OUTPATIENT_CLINIC_OR_DEPARTMENT_OTHER): Payer: 59 | Admitting: Hematology and Oncology

## 2014-11-15 ENCOUNTER — Other Ambulatory Visit: Payer: Self-pay | Admitting: Hematology and Oncology

## 2014-11-15 VITALS — BP 124/79 | HR 67 | Temp 97.6°F | Resp 18 | Ht 67.0 in | Wt 147.4 lb

## 2014-11-15 DIAGNOSIS — N183 Chronic kidney disease, stage 3 unspecified: Secondary | ICD-10-CM

## 2014-11-15 DIAGNOSIS — N63 Unspecified lump in breast: Secondary | ICD-10-CM

## 2014-11-15 DIAGNOSIS — N6320 Unspecified lump in the left breast, unspecified quadrant: Secondary | ICD-10-CM | POA: Insufficient documentation

## 2014-11-15 DIAGNOSIS — C8587 Other specified types of non-Hodgkin lymphoma, spleen: Secondary | ICD-10-CM

## 2014-11-15 DIAGNOSIS — D696 Thrombocytopenia, unspecified: Secondary | ICD-10-CM | POA: Diagnosis not present

## 2014-11-15 DIAGNOSIS — C8307 Small cell B-cell lymphoma, spleen: Secondary | ICD-10-CM | POA: Insufficient documentation

## 2014-11-15 LAB — COMPREHENSIVE METABOLIC PANEL (CC13)
ALBUMIN: 4.2 g/dL (ref 3.5–5.0)
ALK PHOS: 120 U/L (ref 40–150)
ALT: 12 U/L (ref 0–55)
ANION GAP: 6 meq/L (ref 3–11)
AST: 17 U/L (ref 5–34)
BUN: 20.5 mg/dL (ref 7.0–26.0)
CALCIUM: 9.5 mg/dL (ref 8.4–10.4)
CHLORIDE: 98 meq/L (ref 98–109)
CO2: 30 mEq/L — ABNORMAL HIGH (ref 22–29)
CREATININE: 1.3 mg/dL — AB (ref 0.6–1.1)
EGFR: 45 mL/min/{1.73_m2} — ABNORMAL LOW (ref >90)
Glucose: 91 mg/dl (ref 70–140)
Potassium: 4.1 mEq/L (ref 3.5–5.1)
Sodium: 134 mEq/L — ABNORMAL LOW (ref 136–145)
Total Bilirubin: 0.39 mg/dL (ref 0.20–1.20)
Total Protein: 6.1 g/dL — ABNORMAL LOW (ref 6.4–8.3)

## 2014-11-15 LAB — CBC WITH DIFFERENTIAL/PLATELET
BASO%: 0.2 % (ref 0.0–2.0)
Basophils Absolute: 0 10*3/uL (ref 0.0–0.1)
EOS%: 1.4 % (ref 0.0–7.0)
Eosinophils Absolute: 0.1 10*3/uL (ref 0.0–0.5)
HCT: 39.2 % (ref 34.8–46.6)
HGB: 13.5 g/dL (ref 11.6–15.9)
LYMPH#: 1.6 10*3/uL (ref 0.9–3.3)
LYMPH%: 27 % (ref 14.0–49.7)
MCH: 30.8 pg (ref 25.1–34.0)
MCHC: 34.4 g/dL (ref 31.5–36.0)
MCV: 89.3 fL (ref 79.5–101.0)
MONO#: 0.4 10*3/uL (ref 0.1–0.9)
MONO%: 7.3 % (ref 0.0–14.0)
NEUT%: 64.1 % (ref 38.4–76.8)
NEUTROS ABS: 3.7 10*3/uL (ref 1.5–6.5)
PLATELETS: 118 10*3/uL — AB (ref 145–400)
RBC: 4.39 10*6/uL (ref 3.70–5.45)
RDW: 12.1 % (ref 11.2–14.5)
WBC: 5.8 10*3/uL (ref 3.9–10.3)

## 2014-11-15 LAB — LACTATE DEHYDROGENASE (CC13): LDH: 164 U/L (ref 125–245)

## 2014-11-15 NOTE — Assessment & Plan Note (Signed)
She had breast biopsy before. Recent mammogram and ultrasound confirm benign findings. She will continue close monitoring.

## 2014-11-15 NOTE — Assessment & Plan Note (Signed)
She has mild, intermittent elevated creatinine. She also complained of bilateral lower extremity edema which I think is related to mild chronic renal failure. She feels well and will continue to follow with nephrologist for medical management.

## 2014-11-15 NOTE — Assessment & Plan Note (Signed)
The cause is unknown, likely due to consumption. It is mild and there is little change compared from previous platelet count. The patient denies recent history of bleeding such as epistaxis, hematuria or hematochezia up from intermittent hemorrhoidal bleeding. She is asymptomatic from the thrombocytopenia. I will observe for now.   

## 2014-11-15 NOTE — Progress Notes (Signed)
[facility] OFFICE PROGRESS NOTE  Patient Care Team: Velna Hatchet, MD as PCP - General (Internal Medicine) Fleet Contras, MD as Consulting Physician (Nephrology)  SUMMARY OF ONCOLOGIC HISTORY: Oncology History   Non-Hodgkin lymphoma, marginal zone lymphoma   Primary site: Lymphoid Neoplasms   Staging method: AJCC 6th Edition   Clinical: Stage IV signed by Heath Lark, MD on 05/30/2013  8:47 AM   Summary: Stage IV       Non-Hodgkin lymphoma ([place])   02/23/2013 Imaging CT scan show significant enlargement of liver and spleen   05/05/2013 Bone Marrow Biopsy Bone marrow aspirate and biopsy confirmed low grade lymphoma, suspect marginal zone lymphoma   05/08/2013 Imaging PET scan show involvement of liver and spleen   06/02/2013 - 06/23/2013 Chemotherapy She was started on weekly rituximab.   09/04/2013 Imaging PET CT scan show complete response to treatment with splenic size reduced back to normal limits. She has persistent mild thrombocytopenia.   12/06/2013 Imaging PET scan was negative and spleen size is normal   06/06/2014 Imaging PEt scan is negative    INTERVAL HISTORY: Please see below for problem oriented charting. She returns for further follow-up. She is doing well. Denies left upper quadrant discomfort. No new lymphadenopathy. She has seen a nephrologist recently. She has undergone EGD and dilatation. She underwent extensive evaluation for left breast mass detected on mammogram. The patient denies any recent signs or symptoms of bleeding such as spontaneous epistaxis, hematuria or hematochezia.   REVIEW OF SYSTEMS:   Constitutional: Denies fevers, chills or abnormal weight loss Eyes: Denies blurriness of vision Ears, nose, mouth, throat, and face: Denies mucositis or sore throat Respiratory: Denies cough, dyspnea or wheezes Cardiovascular: Denies palpitation, chest discomfort or lower extremity swelling Gastrointestinal:  Denies nausea, heartburn or change  in bowel habits Skin: Denies abnormal skin rashes Lymphatics: Denies new lymphadenopathy or easy bruising Neurological:Denies numbness, tingling or new weaknesses Behavioral/Psych: Mood is stable, no new changes  All other systems were reviewed with the patient and are negative.  I have reviewed the past medical history, past surgical history, social history and family history with the patient and they are unchanged from previous note.  ALLERGIES:  is allergic to biaxin; erythromycin; soy allergy; zithromax; and zofran.  MEDICATIONS:  Current Outpatient Prescriptions  Medication Sig Dispense Refill  . esomeprazole (NEXIUM) 40 MG packet Take 40 mg by mouth daily before breakfast. 30 each 12  . estradiol (ESTRACE) 2 MG tablet Take 2 mg by mouth every evening. Stop taking for 12 days every 90 days while taking progesterone    . Fe Fum-FePoly-Vit C-Vit B3 (INTEGRA) 62.5-62.5-40-3 MG CAPS Take 1 capsule by mouth daily. 30 capsule 1  . folic acid (FOLVITE) 1 MG tablet TAKE 1 TABLET (1 MG TOTAL) BY MOUTH DAILY. 30 tablet PRN  . Linaclotide (LINZESS) 290 MCG CAPS capsule Take 1 capsule (290 mcg total) by mouth every morning. 30 capsule 5  . metoCLOPramide (REGLAN) 5 MG tablet TAKE 1 TABLET BY MOUTH 2 (TWO) TIMES DAILY AT 10 AM AND 4 PM. 180 tablet 1  . progesterone (PROMETRIUM) 100 MG capsule Take 100 mg by mouth as directed. Stop taking for 90 days. Take for 12 days when not taking estradiol    . simethicone (MYLICON) 80 MG chewable tablet Chew 80 mg by mouth every 6 (six) hours as needed for flatulence.    . temazepam (RESTORIL) 30 MG capsule Take 2 capsules (60 mg total) by mouth at bedtime. 60 capsule 1  .  traZODone (DESYREL) 50 MG tablet Take 50 mg by mouth at bedtime.     No current facility-administered medications for this visit.    PHYSICAL EXAMINATION: ECOG PERFORMANCE STATUS: 0 - Asymptomatic  Filed Vitals:   11/15/14 1415  BP: 124/79  Pulse: 67  Temp: 97.6 F (36.4 C)  Resp:  18   Filed Weights   11/15/14 1415  Weight: 147 lb 6.4 oz (66.86 kg)    GENERAL:alert, no distress and comfortable SKIN: skin color, texture, turgor are normal, no rashes or significant lesions EYES: normal, Conjunctiva are pink and non-injected, sclera clear OROPHARYNX:no exudate, no erythema and lips, buccal mucosa, and tongue normal  NECK: supple, thyroid normal size, non-tender, without nodularity LYMPH:  no palpable lymphadenopathy in the cervical, axillary or inguinal LUNGS: clear to auscultation and percussion with normal breathing effort HEART: regular rate & rhythm and no murmurs and no lower extremity edema ABDOMEN:abdomen soft, non-tender and normal bowel sounds Musculoskeletal:no cyanosis of digits and no clubbing  NEURO: alert & oriented x 3 with fluent speech, no focal motor/sensory deficits  LABORATORY DATA:  I have reviewed the data as listed    Component Value Date/Time   NA 134* 11/15/2014 1359   NA 139 05/05/2013 0730   K 4.1 11/15/2014 1359   K 3.9 05/05/2013 0730   CL 101 05/05/2013 0730   CO2 30* 11/15/2014 1359   CO2 26 05/05/2013 0730   GLUCOSE 91 11/15/2014 1359   GLUCOSE 83 05/05/2013 0730   BUN 20.5 11/15/2014 1359   BUN 19 05/05/2013 0730   CREATININE 1.3* 11/15/2014 1359   CREATININE 1.51* 05/05/2013 0730   CALCIUM 9.5 11/15/2014 1359   CALCIUM 9.6 05/05/2013 0730   PROT 6.1* 11/15/2014 1359   PROT 6.4 05/05/2013 0730   ALBUMIN 4.2 11/15/2014 1359   ALBUMIN 4.0 05/05/2013 0730   AST 17 11/15/2014 1359   AST 23 05/05/2013 0730   ALT 12 11/15/2014 1359   ALT 17 05/05/2013 0730   ALKPHOS 120 11/15/2014 1359   ALKPHOS 100 05/05/2013 0730   BILITOT 0.39 11/15/2014 1359   BILITOT 0.5 05/05/2013 0730   GFRNONAA 35* 05/05/2013 0730   GFRAA 41* 05/05/2013 0730    No results found for: SPEP, UPEP  Lab Results  Component Value Date   WBC 5.8 11/15/2014   NEUTROABS 3.7 11/15/2014   HGB 13.5 11/15/2014   HCT 39.2 11/15/2014   MCV 89.3  11/15/2014   PLT 118* 11/15/2014      Chemistry      Component Value Date/Time   NA 134* 11/15/2014 1359   NA 139 05/05/2013 0730   K 4.1 11/15/2014 1359   K 3.9 05/05/2013 0730   CL 101 05/05/2013 0730   CO2 30* 11/15/2014 1359   CO2 26 05/05/2013 0730   BUN 20.5 11/15/2014 1359   BUN 19 05/05/2013 0730   CREATININE 1.3* 11/15/2014 1359   CREATININE 1.51* 05/05/2013 0730      Component Value Date/Time   CALCIUM 9.5 11/15/2014 1359   CALCIUM 9.6 05/05/2013 0730   ALKPHOS 120 11/15/2014 1359   ALKPHOS 100 05/05/2013 0730   AST 17 11/15/2014 1359   AST 23 05/05/2013 0730   ALT 12 11/15/2014 1359   ALT 17 05/05/2013 0730   BILITOT 0.39 11/15/2014 1359   BILITOT 0.5 05/05/2013 0730     ASSESSMENT & PLAN:  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[facility]CC) She has no symptoms of left upper quadrant pain. Repeat PET CT scan showed no evidence  of disease. I recommend observation only. She will continue for weekly treatment of rituximab starting tomorrow. I plan to see her again in 6 months or repeat history, physical examination, blood work and PET scan. If the next PET scan showed no evidence of disease, we will go into observation only.   Thrombocytopenia The cause is unknown, likely due to consumption. It is mild and there is little change compared from previous platelet count. The patient denies recent history of bleeding such as epistaxis, hematuria or hematochezia up from intermittent hemorrhoidal bleeding. She is asymptomatic from the thrombocytopenia. I will observe for now.      Chronic kidney disease, stage III (moderate) She has mild, intermittent elevated creatinine. She also complained of bilateral lower extremity edema which I think is related to mild chronic renal failure. She feels well and will continue to follow with nephrologist for medical management.  Left breast lump She had breast biopsy before. Recent mammogram and ultrasound confirm benign findings. She  will continue close monitoring.   Orders Placed This Encounter  Procedures  . NM PET Image Restag (PS) Skull Base To Thigh    Standing Status: Future     Number of Occurrences:      Standing Expiration Date: 01/15/2016    Order Specific Question:  Reason for Exam (SYMPTOM  OR DIAGNOSIS REQUIRED)    Answer:  staging lymphoma, assess response to Rx    Order Specific Question:  Preferred imaging location?    Answer:  [facility]  . Comprehensive metabolic panel    Standing Status: Future     Number of Occurrences: 1     Standing Expiration Date: 12/20/2015  . CBC with Differential/Platelet    Standing Status: Future     Number of Occurrences: 1     Standing Expiration Date: 12/20/2015  . Lactate dehydrogenase    Standing Status: Future     Number of Occurrences: 1     Standing Expiration Date: 12/20/2015  . Comprehensive metabolic panel    Standing Status: Future     Number of Occurrences:      Standing Expiration Date: 01/15/2016  . CBC with Differential/Platelet    Standing Status: Future     Number of Occurrences:      Standing Expiration Date: 01/15/2016  . Lactate dehydrogenase    Standing Status: Future     Number of Occurrences:      Standing Expiration Date: 01/15/2016   All questions were answered. The patient knows to call the clinic with any problems, questions or concerns. No barriers to learning was detected. I spent 25 minutes counseling the patient face to face. The total time spent in the appointment was 30 minutes and more than 50% was on counseling and review of test results     [facility], [place], MD 11/15/2014 3:50 PM

## 2014-11-15 NOTE — Telephone Encounter (Signed)
Gave and printed appt sched and avs fo rpt for May 2017 °

## 2014-11-15 NOTE — Assessment & Plan Note (Signed)
She has no symptoms of left upper quadrant pain. Repeat PET CT scan showed no evidence of disease. I recommend observation only. She will continue for weekly treatment of rituximab starting tomorrow. I plan to see her again in 6 months or repeat history, physical examination, blood work and PET scan. If the next PET scan showed no evidence of disease, we will go into observation only.

## 2014-11-16 ENCOUNTER — Ambulatory Visit (HOSPITAL_BASED_OUTPATIENT_CLINIC_OR_DEPARTMENT_OTHER): Payer: 59

## 2014-11-16 VITALS — BP 90/74 | HR 64 | Temp 97.8°F | Resp 16

## 2014-11-16 DIAGNOSIS — Z5112 Encounter for antineoplastic immunotherapy: Secondary | ICD-10-CM

## 2014-11-16 DIAGNOSIS — C8587 Other specified types of non-Hodgkin lymphoma, spleen: Secondary | ICD-10-CM

## 2014-11-16 DIAGNOSIS — C8307 Small cell B-cell lymphoma, spleen: Secondary | ICD-10-CM

## 2014-11-16 MED ORDER — LORAZEPAM 2 MG/ML IJ SOLN
0.5000 mg | Freq: Once | INTRAMUSCULAR | Status: AC
  Administered 2014-11-16: 0.5 mg via INTRAVENOUS

## 2014-11-16 MED ORDER — LORAZEPAM 2 MG/ML IJ SOLN
INTRAMUSCULAR | Status: AC
  Filled 2014-11-16: qty 1

## 2014-11-16 MED ORDER — ACETAMINOPHEN 325 MG PO TABS
650.0000 mg | ORAL_TABLET | Freq: Once | ORAL | Status: AC
  Administered 2014-11-16: 650 mg via ORAL

## 2014-11-16 MED ORDER — ACETAMINOPHEN 325 MG PO TABS
ORAL_TABLET | ORAL | Status: AC
  Filled 2014-11-16: qty 2

## 2014-11-16 MED ORDER — PROMETHAZINE HCL 25 MG/ML IJ SOLN
Freq: Once | INTRAMUSCULAR | Status: AC
  Administered 2014-11-16: 11:00:00 via INTRAVENOUS
  Filled 2014-11-16: qty 50

## 2014-11-16 MED ORDER — SODIUM CHLORIDE 0.9 % IV SOLN
375.0000 mg/m2 | Freq: Once | INTRAVENOUS | Status: AC
  Administered 2014-11-16: 700 mg via INTRAVENOUS
  Filled 2014-11-16: qty 70

## 2014-11-16 MED ORDER — SODIUM CHLORIDE 0.9 % IV SOLN
20.0000 mg | Freq: Once | INTRAVENOUS | Status: AC
  Administered 2014-11-16: 20 mg via INTRAVENOUS
  Filled 2014-11-16: qty 2

## 2014-11-16 MED ORDER — SODIUM CHLORIDE 0.9 % IV SOLN
Freq: Once | INTRAVENOUS | Status: AC
  Administered 2014-11-16: 10:00:00 via INTRAVENOUS

## 2014-11-16 NOTE — Progress Notes (Signed)
11:46am Patient c/o IV site.  Site is red and edematous.  IV d/c by Thane Edu RN   Patient had received no Rituxan as yet. IV restarted, VS checked and Rituxan started at 1240pm.

## 2014-11-16 NOTE — Patient Instructions (Signed)
[facility] Discharge Instructions for Patients Receiving Chemotherapy  Today you received the following chemotherapy agents: Rituxan  To help prevent nausea and vomiting after your treatment, we encourage you to take your nausea medication as prescribed by your physician.   If you develop nausea and vomiting that is not controlled by your nausea medication, call the clinic.   BELOW ARE SYMPTOMS THAT SHOULD BE REPORTED IMMEDIATELY:  *FEVER GREATER THAN 100.5 F  *CHILLS WITH OR WITHOUT FEVER  NAUSEA AND VOMITING THAT IS NOT CONTROLLED WITH YOUR NAUSEA MEDICATION  *UNUSUAL SHORTNESS OF BREATH  *UNUSUAL BRUISING OR BLEEDING  TENDERNESS IN MOUTH AND THROAT WITH OR WITHOUT PRESENCE OF ULCERS  *URINARY PROBLEMS  *BOWEL PROBLEMS  UNUSUAL RASH Items with * indicate a potential emergency and should be followed up as soon as possible.  Feel free to call the clinic you have any questions or concerns. The clinic phone number is [phone].  Please show the CHEMO ALERT CARD at check-in to the Emergency Department and triage nurse.   

## 2014-11-23 ENCOUNTER — Ambulatory Visit (HOSPITAL_BASED_OUTPATIENT_CLINIC_OR_DEPARTMENT_OTHER): Payer: 59

## 2014-11-23 VITALS — BP 104/55 | HR 73 | Temp 97.3°F | Resp 18

## 2014-11-23 DIAGNOSIS — C8307 Small cell B-cell lymphoma, spleen: Secondary | ICD-10-CM

## 2014-11-23 DIAGNOSIS — Z5112 Encounter for antineoplastic immunotherapy: Secondary | ICD-10-CM

## 2014-11-23 MED ORDER — ACETAMINOPHEN 325 MG PO TABS
650.0000 mg | ORAL_TABLET | Freq: Once | ORAL | Status: AC
  Administered 2014-11-23: 650 mg via ORAL

## 2014-11-23 MED ORDER — LORAZEPAM 2 MG/ML IJ SOLN
INTRAMUSCULAR | Status: AC
  Filled 2014-11-23: qty 1

## 2014-11-23 MED ORDER — SODIUM CHLORIDE 0.9 % IV SOLN
Freq: Once | INTRAVENOUS | Status: AC
  Administered 2014-11-23: 10:00:00 via INTRAVENOUS
  Filled 2014-11-23: qty 50

## 2014-11-23 MED ORDER — LORAZEPAM 2 MG/ML IJ SOLN
0.5000 mg | Freq: Once | INTRAMUSCULAR | Status: AC
  Administered 2014-11-23: 0.5 mg via INTRAVENOUS

## 2014-11-23 MED ORDER — SODIUM CHLORIDE 0.9 % IV SOLN
375.0000 mg/m2 | Freq: Once | INTRAVENOUS | Status: AC
  Administered 2014-11-23: 700 mg via INTRAVENOUS
  Filled 2014-11-23: qty 60

## 2014-11-23 MED ORDER — SODIUM CHLORIDE 0.9 % IV SOLN
Freq: Once | INTRAVENOUS | Status: AC
  Administered 2014-11-23: 10:00:00 via INTRAVENOUS

## 2014-11-23 MED ORDER — ACETAMINOPHEN 325 MG PO TABS
ORAL_TABLET | ORAL | Status: AC
  Filled 2014-11-23: qty 2

## 2014-11-23 MED ORDER — DEXAMETHASONE SODIUM PHOSPHATE 100 MG/10ML IJ SOLN
20.0000 mg | Freq: Once | INTRAMUSCULAR | Status: AC
  Administered 2014-11-23: 20 mg via INTRAVENOUS
  Filled 2014-11-23: qty 2

## 2014-11-23 NOTE — Patient Instructions (Signed)
[facility] Discharge Instructions for Patients Receiving Chemotherapy  Today you received the following chemotherapy agents: Rituxan  To help prevent nausea and vomiting after your treatment, we encourage you to take your nausea medication as prescribed by your physician.   If you develop nausea and vomiting that is not controlled by your nausea medication, call the clinic.   BELOW ARE SYMPTOMS THAT SHOULD BE REPORTED IMMEDIATELY:  *FEVER GREATER THAN 100.5 F  *CHILLS WITH OR WITHOUT FEVER  NAUSEA AND VOMITING THAT IS NOT CONTROLLED WITH YOUR NAUSEA MEDICATION  *UNUSUAL SHORTNESS OF BREATH  *UNUSUAL BRUISING OR BLEEDING  TENDERNESS IN MOUTH AND THROAT WITH OR WITHOUT PRESENCE OF ULCERS  *URINARY PROBLEMS  *BOWEL PROBLEMS  UNUSUAL RASH Items with * indicate a potential emergency and should be followed up as soon as possible.  Feel free to call the clinic you have any questions or concerns. The clinic phone number is [phone].  Please show the CHEMO ALERT CARD at check-in to the Emergency Department and triage nurse.   

## 2014-11-30 ENCOUNTER — Ambulatory Visit (HOSPITAL_BASED_OUTPATIENT_CLINIC_OR_DEPARTMENT_OTHER): Payer: 59

## 2014-11-30 VITALS — BP 117/56 | HR 71 | Temp 97.9°F | Resp 18

## 2014-11-30 DIAGNOSIS — Z5112 Encounter for antineoplastic immunotherapy: Secondary | ICD-10-CM | POA: Diagnosis not present

## 2014-11-30 DIAGNOSIS — C8307 Small cell B-cell lymphoma, spleen: Secondary | ICD-10-CM

## 2014-11-30 MED ORDER — LORAZEPAM 2 MG/ML IJ SOLN
0.5000 mg | Freq: Once | INTRAMUSCULAR | Status: AC
  Administered 2014-11-30: 0.5 mg via INTRAVENOUS

## 2014-11-30 MED ORDER — SODIUM CHLORIDE 0.9 % IV SOLN
375.0000 mg/m2 | Freq: Once | INTRAVENOUS | Status: AC
  Administered 2014-11-30: 700 mg via INTRAVENOUS
  Filled 2014-11-30: qty 60

## 2014-11-30 MED ORDER — SODIUM CHLORIDE 0.9 % IV SOLN
Freq: Once | INTRAVENOUS | Status: AC
  Administered 2014-11-30: 11:00:00 via INTRAVENOUS
  Filled 2014-11-30: qty 50

## 2014-11-30 MED ORDER — SODIUM CHLORIDE 0.9 % IV SOLN
Freq: Once | INTRAVENOUS | Status: AC
  Administered 2014-11-30: 10:00:00 via INTRAVENOUS

## 2014-11-30 MED ORDER — LORAZEPAM 2 MG/ML IJ SOLN
INTRAMUSCULAR | Status: AC
  Filled 2014-11-30: qty 1

## 2014-11-30 MED ORDER — ACETAMINOPHEN 325 MG PO TABS
ORAL_TABLET | ORAL | Status: AC
  Filled 2014-11-30: qty 2

## 2014-11-30 MED ORDER — ACETAMINOPHEN 325 MG PO TABS
650.0000 mg | ORAL_TABLET | Freq: Once | ORAL | Status: AC
  Administered 2014-11-30: 650 mg via ORAL

## 2014-11-30 MED ORDER — SODIUM CHLORIDE 0.9 % IV SOLN
20.0000 mg | Freq: Once | INTRAVENOUS | Status: AC
  Administered 2014-11-30: 20 mg via INTRAVENOUS
  Filled 2014-11-30: qty 2

## 2014-11-30 NOTE — Patient Instructions (Signed)
Cancer Center Discharge Instructions for Patients Receiving Chemotherapy  Today you received the following chemotherapy agents: Rituxan   To help prevent nausea and vomiting after your treatment, we encourage you to take your nausea medication as directed.    If you develop nausea and vomiting that is not controlled by your nausea medication, call the clinic.   BELOW ARE SYMPTOMS THAT SHOULD BE REPORTED IMMEDIATELY:  *FEVER GREATER THAN 100.5 F  *CHILLS WITH OR WITHOUT FEVER  NAUSEA AND VOMITING THAT IS NOT CONTROLLED WITH YOUR NAUSEA MEDICATION  *UNUSUAL SHORTNESS OF BREATH  *UNUSUAL BRUISING OR BLEEDING  TENDERNESS IN MOUTH AND THROAT WITH OR WITHOUT PRESENCE OF ULCERS  *URINARY PROBLEMS  *BOWEL PROBLEMS  UNUSUAL RASH Items with * indicate a potential emergency and should be followed up as soon as possible.  Feel free to call the clinic you have any questions or concerns. The clinic phone number is [phone].  Please show the CHEMO ALERT CARD at check-in to the Emergency Department and triage nurse.   

## 2014-12-07 ENCOUNTER — Ambulatory Visit (HOSPITAL_BASED_OUTPATIENT_CLINIC_OR_DEPARTMENT_OTHER): Payer: 59

## 2014-12-07 VITALS — BP 112/56 | HR 65 | Temp 97.5°F | Resp 18

## 2014-12-07 DIAGNOSIS — C8587 Other specified types of non-Hodgkin lymphoma, spleen: Secondary | ICD-10-CM

## 2014-12-07 DIAGNOSIS — Z5112 Encounter for antineoplastic immunotherapy: Secondary | ICD-10-CM | POA: Diagnosis not present

## 2014-12-07 DIAGNOSIS — C8307 Small cell B-cell lymphoma, spleen: Secondary | ICD-10-CM

## 2014-12-07 MED ORDER — LORAZEPAM 2 MG/ML IJ SOLN
INTRAMUSCULAR | Status: AC
  Filled 2014-12-07: qty 1

## 2014-12-07 MED ORDER — ACETAMINOPHEN 325 MG PO TABS
ORAL_TABLET | ORAL | Status: AC
  Filled 2014-12-07: qty 2

## 2014-12-07 MED ORDER — ACETAMINOPHEN 325 MG PO TABS
650.0000 mg | ORAL_TABLET | Freq: Once | ORAL | Status: AC
  Administered 2014-12-07: 650 mg via ORAL

## 2014-12-07 MED ORDER — LORAZEPAM 2 MG/ML IJ SOLN
0.5000 mg | Freq: Once | INTRAMUSCULAR | Status: AC
  Administered 2014-12-07: 0.5 mg via INTRAVENOUS

## 2014-12-07 MED ORDER — SODIUM CHLORIDE 0.9 % IV SOLN
20.0000 mg | Freq: Once | INTRAVENOUS | Status: AC
  Administered 2014-12-07: 20 mg via INTRAVENOUS
  Filled 2014-12-07: qty 2

## 2014-12-07 MED ORDER — SODIUM CHLORIDE 0.9 % IV SOLN
Freq: Once | INTRAVENOUS | Status: AC
  Administered 2014-12-07: 10:00:00 via INTRAVENOUS
  Filled 2014-12-07: qty 50

## 2014-12-07 MED ORDER — SODIUM CHLORIDE 0.9 % IV SOLN
375.0000 mg/m2 | Freq: Once | INTRAVENOUS | Status: AC
  Administered 2014-12-07: 700 mg via INTRAVENOUS
  Filled 2014-12-07: qty 70

## 2014-12-07 MED ORDER — SODIUM CHLORIDE 0.9 % IV SOLN
Freq: Once | INTRAVENOUS | Status: AC
  Administered 2014-12-07: 10:00:00 via INTRAVENOUS

## 2014-12-07 NOTE — Patient Instructions (Signed)
[facility] Discharge Instructions for Patients Receiving Chemotherapy  Today you received the following chemotherapy agents Rituxan To help prevent nausea and vomiting after your treatment, we encourage you to take your nausea medication as prescribed.  If you develop nausea and vomiting that is not controlled by your nausea medication, call the clinic.   BELOW ARE SYMPTOMS THAT SHOULD BE REPORTED IMMEDIATELY:  *FEVER GREATER THAN 100.5 F  *CHILLS WITH OR WITHOUT FEVER  NAUSEA AND VOMITING THAT IS NOT CONTROLLED WITH YOUR NAUSEA MEDICATION  *UNUSUAL SHORTNESS OF BREATH  *UNUSUAL BRUISING OR BLEEDING  TENDERNESS IN MOUTH AND THROAT WITH OR WITHOUT PRESENCE OF ULCERS  *URINARY PROBLEMS  *BOWEL PROBLEMS  UNUSUAL RASH Items with * indicate a potential emergency and should be followed up as soon as possible.  Feel free to call the clinic you have any questions or concerns. The clinic phone number is [phone].  Please show the CHEMO ALERT CARD at check-in to the Emergency Department and triage nurse.   

## 2015-01-17 ENCOUNTER — Other Ambulatory Visit: Payer: Self-pay | Admitting: Hematology and Oncology

## 2015-01-21 MED FILL — TEMAZEPAM 30 MG CAPSULE: 30 | 90 days supply | Qty: 180 | Fill #1

## 2015-02-12 MED FILL — traZODone HCL 50 MG TABS: 50 | 90 days supply | Qty: 90 | Fill #2

## 2015-02-15 MED FILL — METOCLOPRAMIDE 5 MG TABLET: 5 | 90 days supply | Qty: 180 | Fill #0

## 2015-02-15 MED FILL — SPIRONOLACTONE 25 MG TABLET: 25 | 30 days supply | Qty: 30 | Fill #1

## 2015-02-15 MED FILL — FOLIC ACID 1 MG TABLET: 1 | 30 days supply | Qty: 30 | Fill #8

## 2015-03-04 MED FILL — INTEGRA CAPSULE: 62.5-62.5-4 | 90 days supply | Qty: 90 | Fill #0

## 2015-03-25 ENCOUNTER — Other Ambulatory Visit: Payer: Self-pay | Admitting: *Deleted

## 2015-03-25 NOTE — Patient Outreach (Addendum)
Initial outreach via phone for high cost referral assessment. [place] Plan member was diagnosed with non Hodgkin's Lymphoma in  April 2015. 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 She states both she and her husband, who is also on the [place], are doing OK despite cancer diagnosis for both. She denies need for care management services for herself or her husband. However, she says she is struggling to find resources for her elderly Mom who is now living with them because of her health issues. Have requested care management assistant Shannon Barajas, to determine if Shannon Barajas's Mom is eligible for [facility] CM community services. Barrington Ellison RN,CCM,CDE [place] Management Coordinator Link To Wellness Office Phone [phone] Office Fax [phone]

## 2015-03-26 MED FILL — ESTRADIOL 1 MG TABLET: 1 | 90 days supply | Qty: 90 | Fill #0

## 2015-03-26 MED FILL — CEPHALEXIN 500 MG CAPSULE: 500 | 5 days supply | Qty: 10 | Fill #0

## 2015-03-27 ENCOUNTER — Ambulatory Visit (INDEPENDENT_AMBULATORY_CARE_PROVIDER_SITE_OTHER): Payer: 59 | Admitting: Family Medicine

## 2015-03-27 VITALS — BP 110/64 | HR 69 | Temp 97.7°F | Resp 16 | Ht 67.0 in | Wt 147.0 lb

## 2015-03-27 DIAGNOSIS — T148XXA Other injury of unspecified body region, initial encounter: Secondary | ICD-10-CM

## 2015-03-27 DIAGNOSIS — S60451A Superficial foreign body of left index finger, initial encounter: Secondary | ICD-10-CM | POA: Diagnosis not present

## 2015-03-27 DIAGNOSIS — S8011XA Contusion of right lower leg, initial encounter: Secondary | ICD-10-CM | POA: Diagnosis not present

## 2015-03-27 DIAGNOSIS — R350 Frequency of micturition: Secondary | ICD-10-CM

## 2015-03-27 LAB — POCT URINALYSIS DIP (MANUAL ENTRY)
BILIRUBIN UA: NEGATIVE
Bilirubin, UA: NEGATIVE
Glucose, UA: NEGATIVE
Leukocytes, UA: NEGATIVE
Nitrite, UA: NEGATIVE
PH UA: 5.5
PROTEIN UA: NEGATIVE
SPEC GRAV UA: 1.02
UROBILINOGEN UA: 0.2

## 2015-03-27 LAB — POC MICROSCOPIC URINALYSIS (UMFC): Mucus: ABSENT

## 2015-03-27 MED ORDER — NITROFURANTOIN MONOHYD MACRO 100 MG PO CAPS: 100.0000 mg | ORAL_CAPSULE | Freq: Two times a day (BID) | ORAL | Status: DC

## 2015-03-27 NOTE — Progress Notes (Signed)
By signing my name below, I, [name], attest that this documentation has been prepared under the direction and in the presence of [name], MD. Electronically Signed: [name], [place]. 03/27/2015 , 6:20 PM .  Patient was seen in room 1 .   Patient ID: Shannon Barajas MRN: [id number], DOB: 1950-03-07, 65 y.o. Date of Encounter: 03/27/2015  Primary Physician: Velna Hatchet, MD  Chief Complaint:  Chief Complaint  Patient presents with  . Urinary Frequency    x 1 week  . Dysuria  . Leg Injury    x 1 week, lower left leg, dropped a big bottle of shampoo  . Foreign Body in Skin    left hand, index finger, x 3 weeks    HPI:  Leg Injury Shannon GEISSINGER is a 65 y.o. female who presents to [facility] complaining of left foot pain with light swelling and ecchymosis. Pt states that she dropped a big bottle of body wash while taking a shower onto her left foot a week ago. She applied ice to the area and the swelling has gone down some. When she puts shoes on, it's very sore and painful to walk in. She's able to ambulate without limping.   She also informs having stage 3 chronic kidney disease.   Splinter - Index finger She had a splinter in her left index finger that was noticed about 3 weeks ago. She went to a nail salon and was putting her coat up on a coat rack. When she placed it and moved her hand down, she felt the splinter pierce her left index finger.   UTI She also complains of increased urinary frequency with dysuria that started a week ago.   Personal She's been taking care and has been looking after her mother for last 3 weeks. Her mother was really sick from a sinus infection along with a UTI.   Past Medical History  Diagnosis Date  . IBS (irritable bowel syndrome)   . Raynaud's syndrome   . Hemorrhoids   . Splenomegaly   . Hiatal hernia     small   . Stricture and stenosis of esophagus   . GERD (gastroesophageal reflux  disease)   . H/O: iron deficiency anemia   . Thrombocytopenia, unspecified ([place])   . Unspecified constipation   . Insomnia, unspecified   . Chronic interstitial cystitis   . Diverticulosis     mild   . Interstitial cystitis   . Hemorrhoids 07/26/2013  . Chronic kidney disease (CKD), stage II (mild) 05/25/2014  . Non Hodgkin's lymphoma ([place]) 04/19/2013  . 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 Authorizing Provider  estradiol (ESTRACE) 2 MG tablet Take 2 mg by mouth every evening. Stop taking for 12 days every 90 days while taking progesterone   Yes Historical Provider, MD  Fe Fum-FePoly-Vit C-Vit B3 (INTEGRA) 62.5-62.5-40-3 MG CAPS Take 1 capsule by mouth daily. 12/18/13  Yes Hoyt Koch, MD  folic acid (FOLVITE) 1 MG tablet TAKE 1 TABLET (1 MG TOTAL) BY MOUTH DAILY. 02/27/14  Yes Heath Lark, MD  furosemide (LASIX) 40 MG tablet Take 40 mg by mouth.   Yes Historical Provider, MD  Linaclotide Rolan Lipa) 290 MCG CAPS capsule Take 1 capsule (290 mcg total) by mouth every morning. 07/14/13  Yes Ricard Dillon, MD  metoCLOPramide (REGLAN) 5 MG tablet TAKE 1 TABLET  BY MOUTH 2 (TWO) TIMES DAILY AT 10 AM AND 4 PM.   Yes Ricard Dillon, MD  progesterone (PROMETRIUM) 100 MG capsule Take 100 mg by mouth as directed. Stop taking for 90 days. Take for 12 days when not taking estradiol 04/27/13  Yes Historical Provider, MD  simethicone (MYLICON) 80 MG chewable tablet Chew 80 mg by mouth every 6 (six) hours as needed for flatulence.   Yes Historical Provider, MD  spironolactone (ALDACTONE) 25 MG tablet Take 25 mg by mouth daily.   Yes Historical Provider, MD  temazepam (RESTORIL) 30 MG capsule Take 2 capsules (60 mg total) by mouth at bedtime. 12/18/13  Yes Hoyt Koch, MD  traZODone (DESYREL) 50 MG tablet Take 50 mg by mouth at bedtime.   Yes Historical Provider, MD    Allergies:  Allergies    Allergen Reactions  . Biaxin [Clarithromycin]     REACTION: aggrevates IBS  . Erythromycin     REACTION: aggrevates IBS  . Soy Allergy     May arregevate interstitial cystitis  . Zithromax [Azithromycin]   . Zofran [Ondansetron Hcl]     unknown    Social History   Social History  . Marital Status: Married    Spouse Name: N/A  . Number of Children: 2  . Years of Education: N/A   Occupational History  . [employer]     Social History Main Topics  . Smoking status: Never Smoker   . Smokeless tobacco: Never Used  . Alcohol Use: No  . Drug Use: No  . Sexual Activity: Not on file   Other Topics Concern  . Not on file   Social History Narrative     Review of Systems: Constitutional: negative for fever, chills, night sweats, weight changes, or fatigue  HEENT: negative for vision changes, hearing loss, congestion, rhinorrhea, ST, epistaxis, or sinus pressure Cardiovascular: negative for chest pain or palpitations Respiratory: negative for hemoptysis, wheezing, shortness of breath, or cough Abdominal: negative for abdominal pain, nausea, vomiting, diarrhea, or constipation Dermatological: negative for rash Musc: positive for myalgia (left foot)  GU: positive for urinary frequency, dysuria  Neurologic: negative for headache, dizziness, or syncope All other systems reviewed and are otherwise negative with the exception to those above and in the HPI.  Physical Exam: Blood pressure 110/64, pulse 69, temperature 97.7 F (36.5 C), resp. rate 16, height 5\' 7"  (1.702 m), weight 147 lb (66.679 kg), SpO2 99 %., Body mass index is 23.02 kg/(m^2). General: Well developed, well nourished, in no acute distress. Head: Normocephalic, atraumatic, eyes without discharge, sclera non-icteric, nares are without discharge. Bilateral auditory canals clear, TM's are without perforation, pearly grey and translucent with reflective cone of light bilaterally. Oral cavity moist, posterior pharynx  without exudate, erythema, peritonsillar abscess, or post nasal drip.  Neck: Supple. No thyromegaly. Full ROM. No lymphadenopathy. Lungs: Clear bilaterally to auscultation without wheezes, rales, or rhonchi. Breathing is unlabored. Heart: RRR with S1 S2. No murmurs, rubs, or gallops appreciated. Abdomen: Soft, non-tender, non-distended with normoactive bowel sounds. No hepatomegaly. No rebound/guarding. No obvious abdominal masses. Msk:  Strength and tone normal for age. Extremities/Skin: Warm and dry. No clubbing or cyanosis. No edema. No rashes or suspicious lesions. Neuro: Alert and oriented X 3. Moves all extremities spontaneously. Gait is normal. CNII-XII grossly in tact. Psych:  Responds to questions appropriately with a normal affect.   After informed consent, patient left index was prepped with alcohol wipe and splinter was extracted using sharp dissection  and no anaesthesia. Sterile dressing was applied.   Labs:  ASSESSMENT AND PLAN:  65 y.o. year old female with  This chart was scribed in my presence and reviewed by me personally.    ICD-9-CM ICD-10-CM   1. Urinary frequency 788.41 R35.0 POCT urinalysis dipstick     POCT Microscopic Urinalysis ([facility])     nitrofurantoin, macrocrystal-monohydrate, (MACROBID) 100 MG capsule  2. Contusion of leg, right, initial encounter 924.5 S80.11XA   3. Splinter in skin 919.6 T14.8         Signed, [name], MD 03/27/2015 6:24 PM

## 2015-03-27 NOTE — Patient Instructions (Signed)
The bruising comes from breaking the saphenous vein and so the blood has continued to saturate the tissues and gravity brings it down to the foot and to the other side of the foot.  Elastics Therapy:  [place] [phone]

## 2015-03-29 ENCOUNTER — Other Ambulatory Visit: Payer: Self-pay | Admitting: *Deleted

## 2015-03-29 NOTE — Patient Outreach (Signed)
Attempted to call Allisa to tell her that her elderly Mom is not eligible for [place] CM services and to provide her with community resources that she might fin helpful for the care of her Mom. Each time her contact number was dialed, a fast busy signal was heard. Contacted [facility] CM social worker [employer] for senior resource information to mail to [place]. Barrington Ellison RN,CCM,CDE [place] Management Coordinator [facility] To Wellness Office Phone [phone] Office Fax [phone]

## 2015-04-02 ENCOUNTER — Encounter: Payer: Self-pay | Admitting: *Deleted

## 2015-04-02 NOTE — Patient Outreach (Signed)
Senior resource information mailed to patient in response to her request for community resources available for her elderly mom who is currently living with them. Se made this request when a high cost outreach call was made to this [facility] member. Barrington Ellison RN,CCM,CDE [place] Management Coordinator Link To Wellness Office Phone [phone] Office Fax [phone]

## 2015-04-15 ENCOUNTER — Other Ambulatory Visit: Payer: Self-pay | Admitting: Hematology and Oncology

## 2015-04-15 MED FILL — PROGESTERONE 100 MG CAPSULE: 100 | 30 days supply | Qty: 12 | Fill #0

## 2015-04-15 MED FILL — FOLIC ACID 1 MG TABLET: 1 | 30 days supply | Qty: 30 | Fill #0

## 2015-04-19 MED FILL — TEMAZEPAM 30 MG CAPSULE: 30 | 90 days supply | Qty: 180 | Fill #2

## 2015-04-23 ENCOUNTER — Other Ambulatory Visit: Payer: Self-pay | Admitting: Obstetrics & Gynecology

## 2015-04-23 DIAGNOSIS — N6320 Unspecified lump in the left breast, unspecified quadrant: Secondary | ICD-10-CM

## 2015-04-23 DIAGNOSIS — N644 Mastodynia: Secondary | ICD-10-CM

## 2015-05-01 ENCOUNTER — Ambulatory Visit
Admission: RE | Admit: 2015-05-01 | Discharge: 2015-05-01 | Disposition: A | Discharge status: Home / Self Care | Payer: 59 | Source: Ambulatory Visit | Attending: Obstetrics & Gynecology | Admitting: Obstetrics & Gynecology

## 2015-05-01 DIAGNOSIS — R922 Inconclusive mammogram: Secondary | ICD-10-CM | POA: Diagnosis not present

## 2015-05-01 DIAGNOSIS — N63 Unspecified lump in breast: Secondary | ICD-10-CM | POA: Diagnosis not present

## 2015-05-01 DIAGNOSIS — N6320 Unspecified lump in the left breast, unspecified quadrant: Secondary | ICD-10-CM

## 2015-05-01 DIAGNOSIS — N644 Mastodynia: Secondary | ICD-10-CM

## 2015-05-09 MED FILL — traZODone HCL 50 MG TABS: 50 | 90 days supply | Qty: 90 | Fill #0

## 2015-06-03 MED FILL — LINZESS 290 MCG CAPSULE: 290 | 90 days supply | Qty: 90 | Fill #1

## 2015-06-03 MED FILL — INTEGRA CAPSULE: 62.5-62.5-4 | 90 days supply | Qty: 90 | Fill #1

## 2015-06-03 MED FILL — FOLIC ACID 1 MG TABLET: 1 | 90 days supply | Qty: 90 | Fill #1

## 2015-06-05 ENCOUNTER — Ambulatory Visit (HOSPITAL_COMMUNITY)
Admission: RE | Admit: 2015-06-05 | Discharge: 2015-06-05 | Disposition: A | Discharge status: Home / Self Care | Payer: 59 | Source: Ambulatory Visit | Attending: Hematology and Oncology | Admitting: Hematology and Oncology

## 2015-06-05 ENCOUNTER — Other Ambulatory Visit (HOSPITAL_BASED_OUTPATIENT_CLINIC_OR_DEPARTMENT_OTHER): Payer: 59

## 2015-06-05 DIAGNOSIS — C8307 Small cell B-cell lymphoma, spleen: Secondary | ICD-10-CM | POA: Insufficient documentation

## 2015-06-05 DIAGNOSIS — I7090 Unspecified atherosclerosis: Secondary | ICD-10-CM | POA: Insufficient documentation

## 2015-06-05 DIAGNOSIS — C8587 Other specified types of non-Hodgkin lymphoma, spleen: Secondary | ICD-10-CM

## 2015-06-05 DIAGNOSIS — C8303 Small cell B-cell lymphoma, intra-abdominal lymph nodes: Secondary | ICD-10-CM | POA: Diagnosis not present

## 2015-06-05 LAB — COMPREHENSIVE METABOLIC PANEL
ALBUMIN: 4.4 g/dL (ref 3.5–5.0)
ALK PHOS: 96 U/L (ref 40–150)
ALT: 9 U/L (ref 0–55)
ANION GAP: 7 meq/L (ref 3–11)
AST: 14 U/L (ref 5–34)
BILIRUBIN TOTAL: 0.55 mg/dL (ref 0.20–1.20)
BUN: 16.1 mg/dL (ref 7.0–26.0)
CALCIUM: 9.5 mg/dL (ref 8.4–10.4)
CO2: 27 mEq/L (ref 22–29)
CREATININE: 1.2 mg/dL — AB (ref 0.6–1.1)
Chloride: 107 mEq/L (ref 98–109)
EGFR: 46 mL/min/{1.73_m2} — ABNORMAL LOW (ref >90)
Glucose: 102 mg/dl (ref 70–140)
Potassium: 4 mEq/L (ref 3.5–5.1)
Sodium: 141 mEq/L (ref 136–145)
Total Protein: 6.5 g/dL (ref 6.4–8.3)

## 2015-06-05 LAB — LACTATE DEHYDROGENASE: LDH: 202 U/L (ref 125–245)

## 2015-06-05 LAB — CBC WITH DIFFERENTIAL/PLATELET
BASO%: 0.8 % (ref 0.0–2.0)
BASOS ABS: 0 10*3/uL (ref 0.0–0.1)
EOS%: 1 % (ref 0.0–7.0)
Eosinophils Absolute: 0.1 10*3/uL (ref 0.0–0.5)
HEMATOCRIT: 44.5 % (ref 34.8–46.6)
HEMOGLOBIN: 14.4 g/dL (ref 11.6–15.9)
LYMPH#: 1.6 10*3/uL (ref 0.9–3.3)
LYMPH%: 25.6 % (ref 14.0–49.7)
MCH: 29.9 pg (ref 25.1–34.0)
MCHC: 32.5 g/dL (ref 31.5–36.0)
MCV: 92.2 fL (ref 79.5–101.0)
MONO#: 0.4 10*3/uL (ref 0.1–0.9)
MONO%: 6.6 % (ref 0.0–14.0)
NEUT#: 4.1 10*3/uL (ref 1.5–6.5)
NEUT%: 66 % (ref 38.4–76.8)
PLATELETS: 116 10*3/uL — AB (ref 145–400)
RBC: 4.83 10*6/uL (ref 3.70–5.45)
RDW: 12.4 % (ref 11.2–14.5)
WBC: 6.2 10*3/uL (ref 3.9–10.3)

## 2015-06-05 LAB — GLUCOSE, CAPILLARY: GLUCOSE-CAPILLARY: 78 mg/dL (ref 65–99)

## 2015-06-05 MED ORDER — FLUDEOXYGLUCOSE F - 18 (FDG) INJECTION
7.0200 | Freq: Once | INTRAVENOUS | Status: AC | PRN
  Administered 2015-06-05: 7.02 via INTRAVENOUS

## 2015-06-06 ENCOUNTER — Other Ambulatory Visit: Payer: 59

## 2015-06-07 ENCOUNTER — Telehealth: Payer: Self-pay | Admitting: Hematology and Oncology

## 2015-06-07 ENCOUNTER — Encounter: Payer: Self-pay | Admitting: Hematology and Oncology

## 2015-06-07 ENCOUNTER — Ambulatory Visit (HOSPITAL_BASED_OUTPATIENT_CLINIC_OR_DEPARTMENT_OTHER): Payer: 59 | Admitting: Hematology and Oncology

## 2015-06-07 VITALS — BP 117/62 | HR 71 | Temp 98.0°F | Resp 20 | Ht 67.0 in | Wt 145.2 lb

## 2015-06-07 DIAGNOSIS — Z515 Encounter for palliative care: Secondary | ICD-10-CM

## 2015-06-07 DIAGNOSIS — D696 Thrombocytopenia, unspecified: Secondary | ICD-10-CM

## 2015-06-07 DIAGNOSIS — C8307 Small cell B-cell lymphoma, spleen: Secondary | ICD-10-CM

## 2015-06-07 DIAGNOSIS — C8587 Other specified types of non-Hodgkin lymphoma, spleen: Secondary | ICD-10-CM | POA: Diagnosis not present

## 2015-06-07 DIAGNOSIS — N183 Chronic kidney disease, stage 3 unspecified: Secondary | ICD-10-CM

## 2015-06-07 NOTE — Progress Notes (Signed)
[place] OFFICE PROGRESS NOTE  Patient Care Team: Velna Hatchet, MD as PCP - General (Internal Medicine) Fleet Contras, MD as Consulting Physician (Nephrology)  SUMMARY OF ONCOLOGIC HISTORY: Oncology History   Non-Hodgkin lymphoma, marginal zone lymphoma   Primary site: Lymphoid Neoplasms   Staging method: AJCC 6th Edition   Clinical: Stage IV signed by Heath Lark, MD on 05/30/2013  8:47 AM   Summary: Stage IV     Splenic marginal zone b-cell lymphoma ([place])   02/23/2013 Imaging CT scan show significant enlargement of liver and spleen   05/05/2013 Bone Marrow Biopsy Bone marrow aspirate and biopsy confirmed low grade lymphoma, suspect marginal zone lymphoma   05/08/2013 Imaging PET scan show involvement of liver and spleen   06/02/2013 - 06/23/2013 Chemotherapy She was started on weekly rituximab.   09/04/2013 Imaging PET CT scan show complete response to treatment with splenic size reduced back to normal limits. She has persistent mild thrombocytopenia.   12/06/2013 Imaging PET scan was negative and spleen size is normal   06/06/2014 Imaging PEt scan is negative   06/05/2015 Imaging No findings to suggest residual/recurrent splenic lymphoma    INTERVAL HISTORY: Please see below for problem oriented charting. She returns for further follow-up. Denies recent infection. The patient denies any recent signs or symptoms of bleeding such as spontaneous epistaxis, hematuria or hematochezia. She has easy bruising. No new lymphadenopathy  REVIEW OF SYSTEMS:   Constitutional: Denies fevers, chills or abnormal weight loss Eyes: Denies blurriness of vision Ears, nose, mouth, throat, and face: Denies mucositis or sore throat Respiratory: Denies cough, dyspnea or wheezes Cardiovascular: Denies palpitation, chest discomfort or lower extremity swelling Gastrointestinal:  Denies nausea, heartburn or change in bowel habits Skin: Denies abnormal skin rashes Lymphatics: Denies new  lymphadenopathy  Neurological:Denies numbness, tingling or new weaknesses Behavioral/Psych: Mood is stable, no new changes  All other systems were reviewed with the patient and are negative.  I have reviewed the past medical history, past surgical history, social history and family history with the patient and they are unchanged from previous note.  ALLERGIES:  is allergic to biaxin; erythromycin; soy allergy; zithromax; and zofran.  MEDICATIONS:  Current Outpatient Prescriptions  Medication Sig Dispense Refill  . estradiol (ESTRACE) 2 MG tablet Take 2 mg by mouth every evening. Stop taking for 12 days every 90 days while taking progesterone    . Fe Fum-FePoly-Vit C-Vit B3 (INTEGRA) 62.5-62.5-40-3 MG CAPS Take 1 capsule by mouth daily. 30 capsule 1  . folic acid (FOLVITE) 1 MG tablet TAKE 1 TABLET BY MOUTH DAILY. 30 tablet PRN  . Linaclotide (LINZESS) 290 MCG CAPS capsule Take 1 capsule (290 mcg total) by mouth every morning. 30 capsule 5  . metoCLOPramide (REGLAN) 5 MG tablet TAKE 1 TABLET BY MOUTH 2 (TWO) TIMES DAILY AT 10 AM AND 4 PM. 180 tablet 1  . progesterone (PROMETRIUM) 100 MG capsule Take 100 mg by mouth as directed. Stop taking for 90 days. Take for 12 days when not taking estradiol    . simethicone (MYLICON) 80 MG chewable tablet Chew 80 mg by mouth every 6 (six) hours as needed for flatulence.    . temazepam (RESTORIL) 30 MG capsule Take 2 capsules (60 mg total) by mouth at bedtime. 60 capsule 1  . traZODone (DESYREL) 50 MG tablet Take 50 mg by mouth at bedtime.     No current facility-administered medications for this visit.    PHYSICAL EXAMINATION: ECOG PERFORMANCE STATUS: 0 - Asymptomatic  Filed Vitals:   06/07/15 1324  BP: 117/62  Pulse: 71  Temp: 98 F (36.7 C)  Resp: 20   Filed Weights   06/07/15 1324  Weight: 145 lb 3.2 oz (65.862 kg)    GENERAL:alert, no distress and comfortable SKIN: skin color, texture, turgor are normal, no rashes or significant  lesions EYES: normal, Conjunctiva are pink and non-injected, sclera clear Musculoskeletal:no cyanosis of digits and no clubbing  NEURO: alert & oriented x 3 with fluent speech, no focal motor/sensory deficits  LABORATORY DATA:  I have reviewed the data as listed    Component Value Date/Time   NA 141 06/05/2015 1201   NA 139 05/05/2013 0730   K 4.0 06/05/2015 1201   K 3.9 05/05/2013 0730   CL 101 05/05/2013 0730   CO2 27 06/05/2015 1201   CO2 26 05/05/2013 0730   GLUCOSE 102 06/05/2015 1201   GLUCOSE 83 05/05/2013 0730   BUN 16.1 06/05/2015 1201   BUN 19 05/05/2013 0730   CREATININE 1.2* 06/05/2015 1201   CREATININE 1.51* 05/05/2013 0730   CALCIUM 9.5 06/05/2015 1201   CALCIUM 9.6 05/05/2013 0730   PROT 6.5 06/05/2015 1201   PROT 6.4 05/05/2013 0730   ALBUMIN 4.4 06/05/2015 1201   ALBUMIN 4.0 05/05/2013 0730   AST 14 06/05/2015 1201   AST 23 05/05/2013 0730   ALT 9 06/05/2015 1201   ALT 17 05/05/2013 0730   ALKPHOS 96 06/05/2015 1201   ALKPHOS 100 05/05/2013 0730   BILITOT 0.55 06/05/2015 1201   BILITOT 0.5 05/05/2013 0730   GFRNONAA 35* 05/05/2013 0730   GFRAA 41* 05/05/2013 0730    No results found for: SPEP, UPEP  Lab Results  Component Value Date   WBC 6.2 06/05/2015   NEUTROABS 4.1 06/05/2015   HGB 14.4 06/05/2015   HCT 44.5 06/05/2015   MCV 92.2 06/05/2015   PLT 116* 06/05/2015      Chemistry      Component Value Date/Time   NA 141 06/05/2015 1201   NA 139 05/05/2013 0730   K 4.0 06/05/2015 1201   K 3.9 05/05/2013 0730   CL 101 05/05/2013 0730   CO2 27 06/05/2015 1201   CO2 26 05/05/2013 0730   BUN 16.1 06/05/2015 1201   BUN 19 05/05/2013 0730   CREATININE 1.2* 06/05/2015 1201   CREATININE 1.51* 05/05/2013 0730      Component Value Date/Time   CALCIUM 9.5 06/05/2015 1201   CALCIUM 9.6 05/05/2013 0730   ALKPHOS 96 06/05/2015 1201   ALKPHOS 100 05/05/2013 0730   AST 14 06/05/2015 1201   AST 23 05/05/2013 0730   ALT 9 06/05/2015 1201   ALT  17 05/05/2013 0730   BILITOT 0.55 06/05/2015 1201   BILITOT 0.5 05/05/2013 0730     We reviewed multiple imaging studies which showed that she is currently in remission ASSESSMENT & PLAN:  Splenic marginal zone b-cell lymphoma ([place]) She has no symptoms of left upper quadrant pain. Repeat PET CT scan showed no evidence of disease. I recommend observation only. I plan to see her again in 6 months or repeat history, physical examination, blood work     Thrombocytopenia The cause is unknown, likely due to consumption. It is mild and there is little change compared from previous platelet count. The patient denies recent history of bleeding such as epistaxis, hematuria or hematochezia up from intermittent hemorrhoidal bleeding. She is asymptomatic from the thrombocytopenia. I will observe for now.      Chronic  kidney disease, stage III (moderate) She has mild, intermittent elevated creatinine. She also complained of bilateral lower extremity edema which I think is related to mild chronic renal failure. She feels well and will continue to follow with nephrologist for medical management.  Quality of life palliative care encounter We discussed increase physical activity. We discussed importance of vitamin D supplementation. Imaging studies revealed mild arteriosclerosis but she is not symptomatic. She does not have any major risk factors for heart disease. We discussed the risks and benefit of aspirin therapy but due to excessive bruising, she declined. We also discussed extensively about cancer survivorship and coping. Both the patient and her husband are my patients. Both of them have extreme stress at home taking care of her mother who is ill from Alzheimer's. They are in the process of getting her transferred to long-term care facility. I offered my emotional support and recommend cancer survivorship clinic visit in the future.    Orders Placed This Encounter  Procedures  . CBC  with Differential/Platelet    Standing Status: Future     Number of Occurrences:      Standing Expiration Date: 07/11/2016  . Comprehensive metabolic panel    Standing Status: Future     Number of Occurrences:      Standing Expiration Date: 07/11/2016   All questions were answered. The patient knows to call the clinic with any problems, questions or concerns. No barriers to learning was detected. I spent 25 minutes counseling the patient face to face. The total time spent in the appointment was 30 minutes and more than 50% was on counseling and review of test results     [facility], [place], MD 06/07/2015 4:56 PM

## 2015-06-07 NOTE — Assessment & Plan Note (Signed)
We discussed increase physical activity. We discussed importance of vitamin D supplementation. Imaging studies revealed mild arteriosclerosis but she is not symptomatic. She does not have any major risk factors for heart disease. We discussed the risks and benefit of aspirin therapy but due to excessive bruising, she declined. We also discussed extensively about cancer survivorship and coping. Both the patient and her husband are my patients. Both of them have extreme stress at home taking care of her mother who is ill from Alzheimer's. They are in the process of getting her transferred to long-term care facility. I offered my emotional support and recommend cancer survivorship clinic visit in the future.

## 2015-06-07 NOTE — Assessment & Plan Note (Signed)
The cause is unknown, likely due to consumption. It is mild and there is little change compared from previous platelet count. The patient denies recent history of bleeding such as epistaxis, hematuria or hematochezia up from intermittent hemorrhoidal bleeding. She is asymptomatic from the thrombocytopenia. I will observe for now.   

## 2015-06-07 NOTE — Assessment & Plan Note (Signed)
She has no symptoms of left upper quadrant pain. Repeat PET CT scan showed no evidence of disease. I recommend observation only. I plan to see her again in 6 months or repeat history, physical examination, blood work

## 2015-06-07 NOTE — Telephone Encounter (Signed)
let msg for 11/10 apt

## 2015-06-07 NOTE — Assessment & Plan Note (Signed)
She has mild, intermittent elevated creatinine. She also complained of bilateral lower extremity edema which I think is related to mild chronic renal failure. She feels well and will continue to follow with nephrologist for medical management.

## 2015-06-27 ENCOUNTER — Ambulatory Visit (INDEPENDENT_AMBULATORY_CARE_PROVIDER_SITE_OTHER): Payer: 59 | Admitting: Family Medicine

## 2015-06-27 VITALS — BP 114/72 | HR 101 | Temp 98.4°F | Resp 18 | Ht 67.0 in | Wt 145.4 lb

## 2015-06-27 DIAGNOSIS — R350 Frequency of micturition: Secondary | ICD-10-CM | POA: Diagnosis not present

## 2015-06-27 DIAGNOSIS — J0101 Acute recurrent maxillary sinusitis: Secondary | ICD-10-CM | POA: Diagnosis not present

## 2015-06-27 DIAGNOSIS — J069 Acute upper respiratory infection, unspecified: Secondary | ICD-10-CM | POA: Diagnosis not present

## 2015-06-27 LAB — POCT URINALYSIS DIP (MANUAL ENTRY)
BILIRUBIN UA: NEGATIVE
GLUCOSE UA: NEGATIVE
Leukocytes, UA: NEGATIVE
Nitrite, UA: NEGATIVE
PH UA: 5.5
Spec Grav, UA: 1.01
Urobilinogen, UA: 0.2

## 2015-06-27 LAB — POC MICROSCOPIC URINALYSIS (UMFC): Mucus: ABSENT

## 2015-06-27 LAB — POCT CBC
GRANULOCYTE PERCENT: 82 % — AB (ref 37–80)
HEMATOCRIT: 40.3 % (ref 37.7–47.9)
HEMOGLOBIN: 14.1 g/dL (ref 12.2–16.2)
Lymph, poc: 1.3 (ref 0.6–3.4)
MCH, POC: 30.9 pg (ref 27–31.2)
MCHC: 34.9 g/dL (ref 31.8–35.4)
MCV: 88.6 fL (ref 80–97)
MID (cbc): 0.7 (ref 0–0.9)
MPV: 7.9 fL (ref 0–99.8)
POC GRANULOCYTE: 9 — AB (ref 2–6.9)
POC LYMPH PERCENT: 11.9 %L (ref 10–50)
POC MID %: 6.1 %M (ref 0–12)
Platelet Count, POC: 119 10*3/uL — AB (ref 142–424)
RBC: 4.55 M/uL (ref 4.04–5.48)
RDW, POC: 12.2 %
WBC: 11 10*3/uL — AB (ref 4.6–10.2)

## 2015-06-27 LAB — POCT INFLUENZA A/B
INFLUENZA A, POC: NEGATIVE
Influenza B, POC: NEGATIVE

## 2015-06-27 MED ORDER — AZELASTINE HCL 0.1 % NA SOLN: 2.0000 | Freq: Two times a day (BID) | NASAL | Status: DC

## 2015-06-27 MED ORDER — AMOXICILLIN-POT CLAVULANATE 875-125 MG PO TABS: 1.0000 | ORAL_TABLET | Freq: Two times a day (BID) | ORAL | Status: DC

## 2015-06-27 NOTE — Patient Instructions (Addendum)
IF you received an x-ray today, you will receive an invoice from [facility]. Please contact [facility] at [phone] with questions or concerns regarding your invoice.   IF you received labwork today, you will receive an invoice from [facility]. Please contact [facility] at [phone] with questions or concerns regarding your invoice.   Our billing staff will not be able to assist you with questions regarding bills from these companies.  You will be contacted with the lab results as soon as they are available. The fastest way to get your results is to activate your My Chart account. Instructions are located on the last page of this paperwork. If you have not heard from us regarding the results in 2 weeks, please contact this office.   Upper Respiratory Infection, Adult Most upper respiratory infections (URIs) are a viral infection of the air passages leading to the lungs. A URI affects the nose, throat, and upper air passages. The most common type of URI is nasopharyngitis and is typically referred to as "the common cold." URIs run their course and usually go away on their own. Most of the time, a URI does not require medical attention, but sometimes a bacterial infection in the upper airways can follow a viral infection. This is called a secondary infection. Sinus and middle ear infections are common types of secondary upper respiratory infections. Bacterial pneumonia can also complicate a URI. A URI can worsen asthma and chronic obstructive pulmonary disease (COPD). Sometimes, these complications can require emergency medical care and may be life threatening.  CAUSES Almost all URIs are caused by viruses. A virus is a type of germ and can spread from one person to another.  RISKS FACTORS You may be at risk for a URI if:   You smoke.   You have chronic heart or lung disease.  You have a weakened defense (immune) system.   You are very young  or very old.   You have nasal allergies or asthma.  You work in crowded or poorly ventilated areas.  You work in health care facilities or schools. SIGNS AND SYMPTOMS  Symptoms typically develop 2-3 days after you come in contact with a cold virus. Most viral URIs last 7-10 days. However, viral URIs from the influenza virus (flu virus) can last 14-18 days and are typically more severe. Symptoms may include:   Runny or stuffy (congested) nose.   Sneezing.   Cough.   Sore throat.   Headache.   Fatigue.   Fever.   Loss of appetite.   Pain in your forehead, behind your eyes, and over your cheekbones (sinus pain).  Muscle aches.  DIAGNOSIS  Your health care provider may diagnose a URI by:  Physical exam.  Tests to check that your symptoms are not due to another condition such as:  Strep throat.  Sinusitis.  Pneumonia.  Asthma. TREATMENT  A URI goes away on its own with time. It cannot be cured with medicines, but medicines may be prescribed or recommended to relieve symptoms. Medicines may help:  Reduce your fever.  Reduce your cough.  Relieve nasal congestion. HOME CARE INSTRUCTIONS   Take medicines only as directed by your health care provider.   Gargle warm saltwater or take cough drops to comfort your throat as directed by your health care provider.  Use a warm mist humidifier or inhale steam from a shower to increase air moisture. This may make it easier to breathe.  Drink enough fluid to keep your   urine clear or pale yellow.   Eat soups and other clear broths and maintain good nutrition.   Rest as needed.   Return to work when your temperature has returned to normal or as your health care provider advises. You may need to stay home longer to avoid infecting others. You can also use a face mask and careful hand washing to prevent spread of the virus.  Increase the usage of your inhaler if you have asthma.   Do not use any tobacco  products, including cigarettes, chewing tobacco, or electronic cigarettes. If you need help quitting, ask your health care provider. PREVENTION  The best way to protect yourself from getting a cold is to practice good hygiene.   Avoid oral or hand contact with people with cold symptoms.   Wash your hands often if contact occurs.  There is no clear evidence that vitamin C, vitamin E, echinacea, or exercise reduces the chance of developing a cold. However, it is always recommended to get plenty of rest, exercise, and practice good nutrition.  SEEK MEDICAL CARE IF:   You are getting worse rather than better.   Your symptoms are not controlled by medicine.   You have chills.  You have worsening shortness of breath.  You have brown or red mucus.  You have yellow or brown nasal discharge.  You have pain in your face, especially when you bend forward.  You have a fever.  You have swollen neck glands.  You have pain while swallowing.  You have white areas in the back of your throat. SEEK IMMEDIATE MEDICAL CARE IF:   You have severe or persistent:  Headache.  Ear pain.  Sinus pain.  Chest pain.  You have chronic lung disease and any of the following:  Wheezing.  Prolonged cough.  Coughing up blood.  A change in your usual mucus.  You have a stiff neck.  You have changes in your:  Vision.  Hearing.  Thinking.  Mood. MAKE SURE YOU:   Understand these instructions.  Will watch your condition.  Will get help right away if you are not doing well or get worse.   This information is not intended to replace advice given to you by your health care provider. Make sure you discuss any questions you have with your health care provider.   Document Released: 07/01/2000 Document Revised: 05/22/2014 Document Reviewed: 04/12/2013 Elsevier Interactive Patient Education 2016 Elsevier Inc.  

## 2015-06-27 NOTE — Progress Notes (Signed)
Subjective:    Patient ID: Shannon Barajas, female    DOB: [birth year], 65 y.o.   MRN: [id number]  06/27/2015  Cough   HPI This 65 y.o. female presents for evaluation of cough for four days.  Went to [place] to see daughter; returned with onset of cold symptoms five days ago.  Progressively worsening.  No fever/chills/sweats.  +cough/drainage/sore throat.  +dysuria; +nocutria x 4; baseline nocturia x 1.  +HA.  No ear pain.  +ST middle intermittent; coughs with worsening sore throat.  +rhinorrhea yellow; no sinus pressure.  +coughing; +sputum production small amount; no SOB with ambulation.  No wheezing.  +diarrhea this morning; history of chronic constipation.  Linzess for constipation.  Dayquil and Delsym.  Tylenol.  Cough drops.  No asthma; no tobacco.  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  PET scan 05/2015 negative.  Now husband getting sick.       Review of Systems  Constitutional: Negative for fever, chills, diaphoresis and fatigue.  HENT: Positive for congestion, postnasal drip, rhinorrhea, sinus pressure, sore throat and voice change. Negative for ear pain.   Eyes: Negative for visual disturbance.  Respiratory: Positive for cough. Negative for shortness of breath and wheezing.   Cardiovascular: Negative for chest pain, palpitations and leg swelling.  Gastrointestinal: Negative for nausea, vomiting, abdominal pain, diarrhea and constipation.  Endocrine: Negative for cold intolerance, heat intolerance, polydipsia, polyphagia and polyuria.  Skin: Negative for rash.  Neurological: Negative for dizziness, tremors, seizures, syncope, facial asymmetry, speech difficulty, weakness, light-headedness, numbness and headaches.    Past Medical History  Diagnosis Date  . IBS (irritable bowel syndrome)   . Raynaud's syndrome   . Hemorrhoids   . Splenomegaly   . Hiatal hernia     small   . Stricture and stenosis of esophagus   . GERD (gastroesophageal reflux disease)   . H/O: iron  deficiency anemia   . Thrombocytopenia, unspecified ([place])   . Unspecified constipation   . Insomnia, unspecified   . Chronic interstitial cystitis   . Diverticulosis     mild   . Interstitial cystitis   . Hemorrhoids 07/26/2013  . Chronic kidney disease (CKD), stage II (mild) 05/25/2014  . Non Hodgkin's lymphoma ([place]) 04/19/2013  . 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 Breast lumpectomy    . Tubal ligation    . Esophageal dilation    . Appendectomy  2012  . Esophagogastroduodenoscopy (egd) with propofol N/A 09/06/2014    Procedure: ESOPHAGOGASTRODUODENOSCOPY (EGD) WITH PROPOFOL;  Surgeon: [name], MD;  Location: [facility];  Service: Endoscopy;  Laterality: N/A;  . Balloon dilation N/A 09/06/2014    Procedure: BALLOON DILATION;  Surgeon: [name], MD;  Location: [facility];  Service: Endoscopy;  Laterality: N/A;   Allergies  Allergen Reactions  . Biaxin [Clarithromycin]     REACTION: aggrevates IBS  . Erythromycin     REACTION: aggrevates IBS  . Soy Allergy     May arregevate interstitial cystitis  . Zithromax [Azithromycin]   . Zofran [Ondansetron Hcl]     unknown    Social History   Social History  . Marital Status: Married    Spouse Name: N/A  . Number of Children: 2  . Years of Education: N/A   Occupational History  . [employer]     Social History Main Topics  . Smoking status: Never Smoker   . Smokeless tobacco: Never Used  .  Alcohol Use: No  . Drug Use: No  . Sexual Activity: Not on file   Other Topics Concern  . Not on file   Social History Narrative   Family History  Problem Relation Age of Onset  . Diabetes Maternal Grandmother   . Colon cancer Maternal Grandfather   . Cancer Maternal Grandfather     liver ca ?  Marland Kitchen Lymphoma Father     lymphoma  . Cirrhosis Father     heavy drinker       Objective:    BP 114/72 mmHg  Pulse 101  Temp(Src) 98.4 F (36.9 C)  (Oral)  Resp 18  Ht 5\' 7"  (1.702 m)  Wt 145 lb 6.4 oz (65.953 kg)  BMI 22.77 kg/m2  SpO2 99% Physical Exam  Constitutional: She is oriented to person, place, and time. She appears well-developed and well-nourished. No distress.  HENT:  Head: Normocephalic and atraumatic.  Right Ear: Tympanic membrane, external ear and ear canal normal.  Left Ear: Tympanic membrane, external ear and ear canal normal.  Nose: Mucosal edema and rhinorrhea present. Right sinus exhibits maxillary sinus tenderness. Right sinus exhibits no frontal sinus tenderness. Left sinus exhibits maxillary sinus tenderness. Left sinus exhibits no frontal sinus tenderness.  Mouth/Throat: Uvula is midline, oropharynx is clear and moist and mucous membranes are normal.  Eyes: Conjunctivae and EOM are normal. Pupils are equal, round, and reactive to light.  Neck: Normal range of motion. Neck supple. Carotid bruit is not present. No thyromegaly present.  Cardiovascular: Normal rate, regular rhythm, normal heart sounds and intact distal pulses.  Exam reveals no gallop and no friction rub.   No murmur heard. Pulmonary/Chest: Effort normal and breath sounds normal. She has no wheezes. She has no rales.  Abdominal: Soft. Bowel sounds are normal. She exhibits no distension and no mass. There is no tenderness. There is no rebound and no guarding.  Lymphadenopathy:    She has no cervical adenopathy.  Neurological: She is alert and oriented to person, place, and time. No cranial nerve deficit.  Skin: Skin is warm and dry. No rash noted. She is not diaphoretic. No erythema. No pallor.  Psychiatric: She has a normal mood and affect. Her behavior is normal.        Assessment & Plan:   1. Acute upper respiratory infection   2. Urinary frequency   3. Acute recurrent maxillary sinusitis    -New. -Send urine culture. -treat with Augmentin, Astelin, Mucinex DM.   Orders Placed This Encounter  Procedures  . Urine culture    Order  Specific Question:  Source    Answer:  urethra  . POCT CBC  . POCT Influenza A/B  . POCT urinalysis dipstick  . POCT Microscopic Urinalysis ([facility])   Meds ordered this encounter  Medications  . cholecalciferol (VITAMIN D) 1000 units tablet    Sig: Take 1,000 Units by mouth daily. Reported on 07/01/2015  . amoxicillin-clavulanate (AUGMENTIN) 875-125 MG tablet    Sig: Take 1 tablet by mouth 2 (two) times daily.    Dispense:  20 tablet    Refill:  0  . azelastine (ASTELIN) 0.1 % nasal spray    Sig: Place 2 sprays into both nostrils 2 (two) times daily. Use in each nostril as directed    Dispense:  30 mL    Refill:  0    No Follow-up on file.    Kiyan Burmester Elayne Guerin, M.D. [facility] [address]  [place],   [address] [phone] phone [phone] fax

## 2015-06-29 LAB — URINE CULTURE

## 2015-07-01 ENCOUNTER — Ambulatory Visit (INDEPENDENT_AMBULATORY_CARE_PROVIDER_SITE_OTHER): Payer: 59

## 2015-07-01 ENCOUNTER — Encounter: Payer: Self-pay | Admitting: Family Medicine

## 2015-07-01 ENCOUNTER — Ambulatory Visit (INDEPENDENT_AMBULATORY_CARE_PROVIDER_SITE_OTHER): Payer: 59 | Admitting: Family Medicine

## 2015-07-01 VITALS — BP 110/72 | HR 93 | Temp 97.9°F | Resp 18 | Ht 67.0 in | Wt 145.0 lb

## 2015-07-01 DIAGNOSIS — J329 Chronic sinusitis, unspecified: Secondary | ICD-10-CM | POA: Diagnosis not present

## 2015-07-01 DIAGNOSIS — J209 Acute bronchitis, unspecified: Secondary | ICD-10-CM

## 2015-07-01 DIAGNOSIS — J40 Bronchitis, not specified as acute or chronic: Secondary | ICD-10-CM | POA: Diagnosis not present

## 2015-07-01 LAB — POCT CBC
GRANULOCYTE PERCENT: 73.7 % (ref 37–80)
HCT, POC: 37.1 % — AB (ref 37.7–47.9)
Hemoglobin: 13.3 g/dL (ref 12.2–16.2)
Lymph, poc: 1.6 (ref 0.6–3.4)
MCH: 31.7 pg — AB (ref 27–31.2)
MCHC: 35.9 g/dL — AB (ref 31.8–35.4)
MCV: 88.4 fL (ref 80–97)
MID (cbc): 0.3 (ref 0–0.9)
MPV: 8.2 fL (ref 0–99.8)
PLATELET COUNT, POC: 148 10*3/uL (ref 142–424)
POC Granulocyte: 5.2 (ref 2–6.9)
POC LYMPH %: 22.3 % (ref 10–50)
POC MID %: 4 %M (ref 0–12)
RBC: 4.2 M/uL (ref 4.04–5.48)
RDW, POC: 11.5 %
WBC: 7.1 10*3/uL (ref 4.6–10.2)

## 2015-07-01 MED ORDER — HYDROCODONE-HOMATROPINE 5-1.5 MG/5ML PO SYRP: 5.0000 mL | ORAL_SOLUTION | ORAL | Status: DC | PRN

## 2015-07-01 MED ORDER — BENZONATATE 100 MG PO CAPS: 100.0000 mg | ORAL_CAPSULE | Freq: Three times a day (TID) | ORAL | Status: DC | PRN

## 2015-07-01 MED FILL — ESTRADIOL 1 MG TABLET: 1 | 90 days supply | Qty: 90 | Fill #1

## 2015-07-01 MED FILL — HYDROCODONE-HOMATROPINE SYR: 5-1.5 | 4 days supply | Qty: 120 | Fill #0

## 2015-07-01 NOTE — Progress Notes (Signed)
Patient ID: Shannon Barajas, female    DOB: October 30, 1950  Age: 65 y.o. MRN: [id number]  Chief Complaint  Patient presents with  . Sinusitis    Subjective:   Patient was in [place] 10 days ago. The day after coming home she got ill with a respiratory tract infection. She came in and was given Augmentin. She shared medicine with her husband, so she is about 7 pills short. She continues to cough and is quite congested. Has been having rhinorrhea. Continues using the Astelin. No fever. However she has been having night sweats drenching her clothes couple of times.  Current allergies, medications, problem list, past/family and social histories reviewed.  Objective:  BP 110/72 mmHg  Pulse 93  Temp(Src) 97.9 F (36.6 C) (Oral)  Resp 18  Ht 5\' 7"  (1.702 m)  Wt 145 lb (65.772 kg)  BMI 22.71 kg/m2  SpO2 98%  Coughing a lot, looks like she doesn't feel well. TMs normal. Throat clear. Neck supple without nodes. Chest clear to auscultation. Heart regular without murmurs.  Assessment & Plan:   Assessment: 1. Acute bronchitis, unspecified organism   2. Rhinosinusitis       Plan: See orders and instructions  Chest x-ray still looks clear.  Orders Placed This Encounter  Procedures  . DG Chest 2 View    Order Specific Question:  Reason for Exam (SYMPTOM  OR DIAGNOSIS REQUIRED)    Answer:  cough    Order Specific Question:  Preferred imaging location?    Answer:  External  . POCT CBC    Meds ordered this encounter  Medications  . benzonatate (TESSALON) 100 MG capsule    Sig: Take 1-2 capsules (100-200 mg total) by mouth 3 (three) times daily as needed.    Dispense:  30 capsule    Refill:  0  . HYDROcodone-homatropine (HYCODAN) 5-1.5 MG/5ML syrup    Sig: Take 5 mLs by mouth every 4 (four) hours as needed.    Dispense:  120 mL    Refill:  0     Results for orders placed or performed in visit on 07/01/15  POCT CBC  Result Value Ref Range   WBC 7.1 4.6 - 10.2 K/uL   Lymph, poc 1.6 0.6 - 3.4   POC LYMPH PERCENT 22.3 10 - 50 %L   MID (cbc) 0.3 0 - 0.9   POC MID % 4.0 0 - 12 %M   POC Granulocyte 5.2 2 - 6.9   Granulocyte percent 73.7 37 - 80 %G   RBC 4.20 4.04 - 5.48 M/uL   Hemoglobin 13.3 12.2 - 16.2 g/dL   HCT, POC 37.1 (A) 37.7 - 47.9 %   MCV 88.4 80 - 97 fL   MCH, POC 31.7 (A) 27 - 31.2 pg   MCHC 35.9 (A) 31.8 - 35.4 g/dL   RDW, POC 11.5 %   Platelet Count, POC 148 142 - 424 K/uL   MPV 8.2 0 - 99.8 fL    WBC is improved from last week. I think she is going to be on the man. Legal medicines basically the same to give her more for cough.   Patient Instructions   Drink plenty of  fluids to stay well hydrated  Continue using the nasal spray and antibiotics.  Return if not improving, but I expect your is to continue to improve as her white blood count has come     IF you received an x-ray today, you will receive an invoice from [facility].  Please contact [facility] at [phone] with questions or concerns regarding your invoice.   IF you received labwork today, you will receive an invoice from [employer]. Please contact [facility] at [phone] with questions or concerns regarding your invoice.   Our billing staff will not be able to assist you with questions regarding bills from these companies.  You will be contacted with the lab results as soon as they are available. The fastest way to get your results is to activate your My Chart account. Instructions are located on the last page of this paperwork. If you have not heard from [place] regarding the results in 2 weeks, please contact this office.     We recommend that you schedule a mammogram for breast cancer screening. Typically, you do not need a referral to do this. Please contact a local imaging center to schedule your mammogram.  [facility] - [phone]  *ask for the Radiology Department [facility] ([place]) - [phone] or [phone]  [facility] - [phone] [place] [phone] [facility] - [phone]  *ask for the [facility] - [phone]  *ask for the Radiology Department [facility] - [phone]  *ask for the [facility] - [phone]      Return if symptoms worsen or fail to improve.   HOPPER,DAVID, MD 07/01/2015

## 2015-07-01 NOTE — Patient Instructions (Addendum)
Drink plenty of  fluids to stay well hydrated  Continue using the nasal spray and antibiotics.  Return if not improving, but I expect your is to continue to improve as her white blood count has come     IF you received an x-ray today, you will receive an invoice from [facility]. Please contact [facility] at [phone] with questions or concerns regarding your invoice.   IF you received labwork today, you will receive an invoice from [facility]. Please contact [facility] at [phone] with questions or concerns regarding your invoice.   Our billing staff will not be able to assist you with questions regarding bills from these companies.  You will be contacted with the lab results as soon as they are available. The fastest way to get your results is to activate your My Chart account. Instructions are located on the last page of this paperwork. If you have not heard from [place] regarding the results in 2 weeks, please contact this office.     We recommend that you schedule a mammogram for breast cancer screening. Typically, you do not need a referral to do this. Please contact a local imaging center to schedule your mammogram.  [facility] - [phone]  *ask for the Radiology Department [facility] [place] - [phone] or [phone]  [facility] - [phone] [place] [phone] [facility] - [phone]  *ask for the [facility] - [phone]  *ask for the Radiology Department [facility] - [phone]  *ask for the [facility] - [phone]

## 2015-07-11 MED FILL — CEFDINIR 300 MG CAPSULE: 300 | 7 days supply | Qty: 14 | Fill #0

## 2015-07-16 MED FILL — TEMAZEPAM 30 MG CAPSULE: 30 | 30 days supply | Qty: 60 | Fill #0

## 2015-07-24 DIAGNOSIS — J40 Bronchitis, not specified as acute or chronic: Secondary | ICD-10-CM | POA: Diagnosis not present

## 2015-07-24 DIAGNOSIS — J301 Allergic rhinitis due to pollen: Secondary | ICD-10-CM | POA: Diagnosis not present

## 2015-07-24 DIAGNOSIS — Z658 Other specified problems related to psychosocial circumstances: Secondary | ICD-10-CM | POA: Diagnosis not present

## 2015-07-24 DIAGNOSIS — Z6821 Body mass index (BMI) 21.0-21.9, adult: Secondary | ICD-10-CM | POA: Diagnosis not present

## 2015-07-24 DIAGNOSIS — H6123 Impacted cerumen, bilateral: Secondary | ICD-10-CM | POA: Diagnosis not present

## 2015-07-24 MED FILL — predniSONE 10 MG TABS: 10 | 6 days supply | Qty: 21 | Fill #0

## 2015-07-29 DIAGNOSIS — R0789 Other chest pain: Secondary | ICD-10-CM | POA: Diagnosis not present

## 2015-08-01 MED FILL — predniSONE 10 MG TABS: 10 | 6 days supply | Qty: 21 | Fill #0

## 2015-08-09 ENCOUNTER — Other Ambulatory Visit: Payer: Self-pay | Admitting: Internal Medicine

## 2015-08-09 DIAGNOSIS — J40 Bronchitis, not specified as acute or chronic: Secondary | ICD-10-CM | POA: Diagnosis not present

## 2015-08-09 DIAGNOSIS — R059 Cough, unspecified: Secondary | ICD-10-CM

## 2015-08-09 DIAGNOSIS — R9389 Abnormal findings on diagnostic imaging of other specified body structures: Secondary | ICD-10-CM

## 2015-08-09 DIAGNOSIS — R0789 Other chest pain: Secondary | ICD-10-CM | POA: Diagnosis not present

## 2015-08-09 DIAGNOSIS — R918 Other nonspecific abnormal finding of lung field: Secondary | ICD-10-CM | POA: Diagnosis not present

## 2015-08-09 DIAGNOSIS — F419 Anxiety disorder, unspecified: Secondary | ICD-10-CM | POA: Diagnosis not present

## 2015-08-09 DIAGNOSIS — Z658 Other specified problems related to psychosocial circumstances: Secondary | ICD-10-CM | POA: Diagnosis not present

## 2015-08-09 DIAGNOSIS — R05 Cough: Secondary | ICD-10-CM | POA: Diagnosis not present

## 2015-08-09 DIAGNOSIS — Z6821 Body mass index (BMI) 21.0-21.9, adult: Secondary | ICD-10-CM | POA: Diagnosis not present

## 2015-08-09 MED FILL — PROGESTERONE 100 MG CAPSULE: 100 | 30 days supply | Qty: 12 | Fill #1

## 2015-08-09 MED FILL — ALPRAZolam 0.5 MG TABS: 0.5 | 20 days supply | Qty: 60 | Fill #0

## 2015-08-09 MED FILL — traZODone HCL 50 MG TABS: 50 | 90 days supply | Qty: 90 | Fill #1

## 2015-08-09 MED FILL — METOCLOPRAMIDE 5 MG TABLET: 5 | 90 days supply | Qty: 180 | Fill #0 | Status: TO

## 2015-08-12 ENCOUNTER — Ambulatory Visit
Admission: RE | Admit: 2015-08-12 | Discharge: 2015-08-12 | Disposition: A | Discharge status: Home / Self Care | Payer: 59 | Source: Ambulatory Visit | Attending: Internal Medicine | Admitting: Internal Medicine

## 2015-08-12 DIAGNOSIS — R059 Cough, unspecified: Secondary | ICD-10-CM

## 2015-08-12 DIAGNOSIS — R05 Cough: Secondary | ICD-10-CM

## 2015-08-12 DIAGNOSIS — R9389 Abnormal findings on diagnostic imaging of other specified body structures: Secondary | ICD-10-CM

## 2015-08-12 DIAGNOSIS — I313 Pericardial effusion (noninflammatory): Secondary | ICD-10-CM | POA: Diagnosis not present

## 2015-08-12 MED FILL — levoFLOXacin 500 MG TABS: 500 | 7 days supply | Qty: 7 | Fill #0

## 2015-08-13 MED FILL — TEMAZEPAM 30 MG CAPSULE: 30 | 30 days supply | Qty: 60 | Fill #1

## 2015-08-15 ENCOUNTER — Other Ambulatory Visit (INDEPENDENT_AMBULATORY_CARE_PROVIDER_SITE_OTHER): Payer: 59

## 2015-08-15 ENCOUNTER — Ambulatory Visit (INDEPENDENT_AMBULATORY_CARE_PROVIDER_SITE_OTHER): Payer: 59 | Admitting: Pulmonary Disease

## 2015-08-15 ENCOUNTER — Telehealth: Payer: Self-pay | Admitting: Pulmonary Disease

## 2015-08-15 ENCOUNTER — Encounter: Payer: Self-pay | Admitting: Pulmonary Disease

## 2015-08-15 DIAGNOSIS — R05 Cough: Secondary | ICD-10-CM

## 2015-08-15 DIAGNOSIS — R918 Other nonspecific abnormal finding of lung field: Secondary | ICD-10-CM

## 2015-08-15 DIAGNOSIS — R059 Cough, unspecified: Secondary | ICD-10-CM

## 2015-08-15 LAB — CBC WITH DIFFERENTIAL/PLATELET
BASOS ABS: 0 10*3/uL (ref 0.0–0.1)
Basophils Relative: 0.2 % (ref 0.0–3.0)
Eosinophils Absolute: 0.2 10*3/uL (ref 0.0–0.7)
Eosinophils Relative: 1.9 % (ref 0.0–5.0)
HEMATOCRIT: 36.3 % (ref 36.0–46.0)
Hemoglobin: 12.1 g/dL (ref 12.0–15.0)
LYMPHS PCT: 19.9 % (ref 12.0–46.0)
Lymphs Abs: 2.3 10*3/uL (ref 0.7–4.0)
MCHC: 33.2 g/dL (ref 30.0–36.0)
MCV: 89.2 fl (ref 78.0–100.0)
MONOS PCT: 6.5 % (ref 3.0–12.0)
Monocytes Absolute: 0.8 10*3/uL (ref 0.1–1.0)
NEUTROS PCT: 71.5 % (ref 43.0–77.0)
Neutro Abs: 8.4 10*3/uL — ABNORMAL HIGH (ref 1.4–7.7)
Platelets: 140 10*3/uL — ABNORMAL LOW (ref 150.0–400.0)
RBC: 4.07 Mil/uL (ref 3.87–5.11)
RDW: 12.1 % (ref 11.5–15.5)
WBC: 11.8 10*3/uL — ABNORMAL HIGH (ref 4.0–10.5)

## 2015-08-15 LAB — RHEUMATOID FACTOR

## 2015-08-15 LAB — C-REACTIVE PROTEIN: CRP: 0.6 mg/dL (ref 0.5–20.0)

## 2015-08-15 LAB — COMPREHENSIVE METABOLIC PANEL
ALK PHOS: 93 U/L (ref 39–117)
ALT: 10 U/L (ref 0–35)
AST: 12 U/L (ref 0–37)
Albumin: 3.8 g/dL (ref 3.5–5.2)
BILIRUBIN TOTAL: 0.4 mg/dL (ref 0.2–1.2)
BUN: 27 mg/dL — ABNORMAL HIGH (ref 6–23)
CALCIUM: 9.2 mg/dL (ref 8.4–10.5)
CO2: 29 mEq/L (ref 19–32)
Chloride: 104 mEq/L (ref 96–112)
Creatinine, Ser: 1.09 mg/dL (ref 0.40–1.20)
GFR: 53.19 mL/min — AB (ref >60.00)
GLUCOSE: 91 mg/dL (ref 70–99)
Potassium: 3.8 mEq/L (ref 3.5–5.1)
Sodium: 138 mEq/L (ref 135–145)
TOTAL PROTEIN: 6.1 g/dL (ref 6.0–8.3)

## 2015-08-15 LAB — SEDIMENTATION RATE: SED RATE: 12 mm/h (ref 0–30)

## 2015-08-15 MED ORDER — FLUCONAZOLE 200 MG PO TABS: 400.0000 mg | ORAL_TABLET | Freq: Every day | ORAL | 1 refills | Status: DC

## 2015-08-15 MED FILL — FLUCONAZOLE 200 MG TABLET: 200 | 30 days supply | Qty: 60 | Fill #0

## 2015-08-15 NOTE — Patient Instructions (Addendum)
   Call me if you have any new breathing problems or questions before your next appointment.  You can go ahead and stop taking Levaquin.  We will contact you with the results as they become available.   TESTS ORDERED: 1. Serum CBC, CMP, Fungitel/Beta-D Glucan, Aspergillus Antigen, Procalcitonin, ESR, CRP, Coccidoides antibodies, ANA with reflex to comprehensive panel, SSA, SSB, Rheumatoid Factor, & Anti-CCP. 2. Urine Histoplasma & Blastomyces Antigens 3. Serum CBC & LFTs in 2-4 weeks

## 2015-08-15 NOTE — Telephone Encounter (Signed)
IMAGING CT CHEST W/O 08/12/15 (personally reviewed by me): Patchy nodular and groundglass opacities within the medial and lateral segments of the right middle lobe. Additional solid and groundglass nodules within the inferior segment of the lingula. No pleural effusion or thickening appreciated. No pericardial effusion. No pathologic mediastinal adenopathy. These findings were not present on CT imaging from 2008.  LABS 09/05/13 IgG:  492 IgA:  50  11/08/09 ANA:  Negative RF:  <20 SSA:  1 SSB:  <1 IgG:  716 IgA:  91

## 2015-08-15 NOTE — Progress Notes (Signed)
Subjective:    Patient ID: Shannon Barajas, female    DOB: Oct 09, 1950, 65 y.o.   MRN: [id number]  HPI She was in [place], [place], visiting her daughter and returned on 6/4. She reports started on 6/5 she noted she had sinus congestion. She reports postnasal drainage. She was able to blow out a yellow mucus. She reports her cough was nonproductive. This was treated with Augmentin. She reports she continued to have a nonproductive cough and was treated with a course of Omnicef. She reports she also noticed that she was having night sweats. With ongoing symptoms for 3 weeks she was treated with Prednisone without any improvement in her cough. She was seen again last week and had an abnormal CXR that prompted a CT scan of the chest. She started taking Levaquin on Tuesday. She reports her cough started improving. She reports she has to take a hydrocodone cough syrup to help her sleep and decrease her cough. Her husband also had a significant cough that was only rarely productive of a yellow mucus. His symptoms started about 1 week after they returned. He was diagnosed with bronchitis. Her husband's cough seems to have been improving over the last week as well. She continues to have sweats. Denies any fever or chills. Denies any other sick contacts. No adenopathy in her neck, groin, or axilla.  Review of Systems She reports chest pain, pronounced 2 weeks ago that started in her right chest and radiated down her right arm. She reports her pain is relieved with tylenol. Reports some occasional pleurisy. No abdominal pain, diarrhea, or nausea. Has chronic constipation. A pertinent 14 point review of systems is negative except as per the history of presenting illness.  Allergies  Allergen Reactions  . Biaxin [Clarithromycin]     REACTION: aggrevates IBS  . Erythromycin     REACTION: aggrevates IBS  . Soy Allergy     May arregevate interstitial cystitis  . Zithromax [Azithromycin]   . Zofran [Ondansetron  Hcl]     unknown    Current Outpatient Prescriptions on File Prior to Visit  Medication Sig Dispense Refill  . cholecalciferol (VITAMIN D) 1000 units tablet Take 1,000 Units by mouth daily. Reported on 07/01/2015    . estradiol (ESTRACE) 2 MG tablet Take 2 mg by mouth every evening. Stop taking for 12 days every 90 days while taking progesterone    . Fe Fum-FePoly-Vit C-Vit B3 (INTEGRA) 62.5-62.5-40-3 MG CAPS Take 1 capsule by mouth daily. 30 capsule 1  . folic acid (FOLVITE) 1 MG tablet TAKE 1 TABLET BY MOUTH DAILY. 30 tablet PRN  . HYDROcodone-homatropine (HYCODAN) 5-1.5 MG/5ML syrup Take 5 mLs by mouth every 4 (four) hours as needed. 120 mL 0  . Linaclotide (LINZESS) 290 MCG CAPS capsule Take 1 capsule (290 mcg total) by mouth every morning. 30 capsule 5  . metoCLOPramide (REGLAN) 5 MG tablet TAKE 1 TABLET BY MOUTH 2 (TWO) TIMES DAILY AT 10 AM AND 4 PM. 180 tablet 1  . progesterone (PROMETRIUM) 100 MG capsule Take 100 mg by mouth as directed. Reported on 07/01/2015    . temazepam (RESTORIL) 30 MG capsule Take 2 capsules (60 mg total) by mouth at bedtime. 60 capsule 1  . traZODone (DESYREL) 50 MG tablet Take 50 mg by mouth at bedtime.     No current facility-administered medications on file prior to visit.     Past Medical History:  Diagnosis Date  . Chronic interstitial cystitis   . Chronic kidney  disease (CKD), stage II (mild) 05/25/2014  . Diverticulosis    mild   . GERD (gastroesophageal reflux disease)   . H/O: iron deficiency anemia   . Hemorrhoids   . Hemorrhoids 07/26/2013  . Hiatal hernia    small   . IBS (irritable bowel syndrome)   . Insomnia, unspecified   . Interstitial cystitis   . 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 Non Hodgkin's lymphoma ([place]) 04/19/2013  . Raynaud's syndrome   . Splenomegaly   . Stricture and stenosis of esophagus   . Thrombocytopenia, unspecified ([place])   . Unspecified constipation     Past Surgical History:  Procedure  Laterality Date  . APPENDECTOMY  2012  . BALLOON DILATION N/A 09/06/2014   Procedure: BALLOON DILATION;  Surgeon: [name], MD;  Location: [facility];  Service: Endoscopy;  Laterality: N/A;  . BREAST LUMPECTOMY    . ESOPHAGEAL DILATION    . ESOPHAGOGASTRODUODENOSCOPY (EGD) WITH PROPOFOL N/A 09/06/2014   Procedure: ESOPHAGOGASTRODUODENOSCOPY (EGD) WITH PROPOFOL;  Surgeon: [name], MD;  Location: [facility];  Service: Endoscopy;  Laterality: N/A;  . TONSILLECTOMY    . TUBAL LIGATION      Family History  Problem Relation Age of Onset  . Lymphoma Father     lymphoma  . Cirrhosis Father     heavy drinker  . Diabetes Maternal Grandmother   . Colon cancer Maternal Grandfather   . Cancer Maternal Grandfather     liver ca ?    Social History   Social History  . Marital status: Married    Spouse name: N/A  . Number of children: 2  . Years of education: N/A   Occupational History  . [employer]     Social History Main Topics  . Smoking status: Passive Smoke Exposure - Never Smoker    Types: Cigarettes  . Smokeless tobacco: Never Used     Comment: Father growing up & daughter currently  . Alcohol use No  . Drug use: No  . Sexual activity: Not Asked   Other Topics Concern  . None   Social History Narrative   Originally from [place]. She moved to [place] in the 1980s. 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 No pets currently. No bird exposure. 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 Enjoys spending time with family & reading.         Objective:   Physical Exam BP 90/62 (BP Location: Left Arm, Cuff Size: Normal)   Pulse 84   Ht _0  (1.702 m)   Wt 141 lb 12.8 oz (64.3 kg)   SpO2 100%   BMI 22.21 kg/m  General:  Awake. Alert. No acute distress. Husband with her today.  Integument:  Warm & dry. No rash on exposed skin. No bruising. Lymphatics:  No appreciated cervical or supraclavicular  lymphadenoapthy. HEENT:  Moist mucus membranes. No oral ulcers. No scleral injection or icterus.Erythema of the external auditory canal. Cardiovascular:  Regular rate. No edema. No appreciable JVD.  Pulmonary:  Good aeration & clear to auscultation bilaterally. Symmetric chest wall expansion. No accessory muscle use on room air. Abdomen: Soft. Normal bowel sounds. Nondistended. Grossly nontender. Musculoskeletal:  Normal bulk and tone. Hand grip strength 5/5 bilaterally. No joint deformity or effusion appreciated. Neurological:  CN 2-12 grossly in tact. No meningismus. Moving all 4 extremities equally. Symmetric brachioradialis deep tendon reflexes. Psychiatric:  Mood and  affect congruent. Speech normal rhythm, rate & tone.   IMAGING CT CHEST W/O 08/12/15 (personally reviewed by me): Patchy nodular and groundglass opacities within the medial and lateral segments of the right middle lobe. Additional solid and groundglass nodules within the inferior segment of the lingula. No pleural effusion or thickening appreciated. No pericardial effusion. No pathologic mediastinal adenopathy. These findings were not present on CT imaging from 2008.  LABS 09/05/13 IgG:  492 IgA:  50  11/08/09 ANA:  Negative RF:  <20 SSA:  1 SSB:  <1 IgG:  716 IgA:  91    Assessment & Plan:  65 year old female found to have bilateral lung nodules on CT imaging.Given patient's travel to [place], constellation of symptoms, and the fact that her husband was also ill I'm highly suspicious that she may have contracted valley fever from Coccidioides immitis. Without symptomatic benefit from antibiotic therapy I do not feel that continuing Levaquin at this time is necessary. It's possible that she may have an autoimmune/alternative inflammatory process but this is unlikely given her previous serum testing. Even so, I am repeating this testing today. Given the pattern on CT imaging I am less suspicious about a  malignant process but this must still remains in the differential with her history of lymphoma. I instructed the patient contact my office if she had any further questions or concerns before her next appointment.  1. Bilateral Lung Nodules:  Suspect secondary to Coccidioides. Initiating empiric treatment with Diflucan 400 mg by mouth daily. Checking serum CBC, CMP, beta D glucan, Aspergillus antigen, Procalcitonin, ESR, CRP, Coccidioides antibodies, ANA with reflex to comprehensive panel, SSA, SSB, rheumatoid factor, & anti-CCP. Also checking urine Histoplasma and Blastomyces antigens. Plan to repeat CBC & LFTs in 2-4 weeks. Patient will need follow-up CT imaging in 4-6 weeks to ensure no signs of progression. Plan to refer to infectious diseases if Coccidioides antibodies are positive. Advised patient's husband he may need testing as well. 2. Cough: Likely secondary to underlying inflammatory process involving lung nodules. Continuing cough suppression. 3. Follow-up: Return to clinic in 4 weeks.  Sonia Baller Ashok Cordia, M.D. [facility] Pager:  [phone] After 3pm or if no response, call [phone] 1:53 PM 08/15/15

## 2015-08-16 LAB — ANA COMPREHENSIVE PANEL
Anti JO-1: <0.2 AI (ref 0.0–0.9)
Centromere Ab Screen: <0.2 AI (ref 0.0–0.9)
ENA RNP Ab: <0.2 AI (ref 0.0–0.9)
ENA SSA (RO) Ab: <0.2 AI (ref 0.0–0.9)
dsDNA Ab: <1 IU/mL (ref 0–9)

## 2015-08-16 LAB — SJOGRENS SYNDROME-B EXTRACTABLE NUCLEAR ANTIBODY: SSB (La) (ENA) Antibody, IgG: <1

## 2015-08-16 LAB — SJOGRENS SYNDROME-A EXTRACTABLE NUCLEAR ANTIBODY: SSA (Ro) (ENA) Antibody, IgG: <1

## 2015-08-16 LAB — CYCLIC CITRUL PEPTIDE ANTIBODY, IGG

## 2015-08-16 LAB — ANA W/REFLEX: Anti Nuclear Antibody(ANA): NEGATIVE

## 2015-08-16 LAB — FUNGITELL, SERUM

## 2015-08-17 LAB — COCCIDIOIDES ANTIBODIES

## 2015-08-18 LAB — PROCALCITONIN: Procalcitonin: <0.1 ng/mL (ref <0.10)

## 2015-08-19 ENCOUNTER — Other Ambulatory Visit: Payer: Self-pay | Admitting: Pulmonary Disease

## 2015-08-19 ENCOUNTER — Other Ambulatory Visit: Payer: 59

## 2015-08-19 DIAGNOSIS — R05 Cough: Secondary | ICD-10-CM

## 2015-08-19 DIAGNOSIS — R059 Cough, unspecified: Secondary | ICD-10-CM

## 2015-08-19 LAB — ASPERGILLUS ANTIGEN,SERUM
Aspergillus Ag, EIA: NOT DETECTED
Index Value: 0.19 (ref <0.50)

## 2015-08-19 LAB — MVISTA BLASTOMYCES QNT AG, URINE

## 2015-08-19 LAB — HISTOPLASMA ANTIGEN, URINE: Histoplasma Antigen, urine: <0.5 ng/mL

## 2015-08-20 ENCOUNTER — Telehealth: Payer: Self-pay | Admitting: *Deleted

## 2015-08-20 NOTE — Telephone Encounter (Signed)
-----   Message from Javier Glazier, MD sent at 08/20/2015 10:07 AM EDT ----- Regarding: Bronchoscopy with Fluoro Shannon Barajas,  Need to schedule a bronchoscopy with Fluoroscopy on Friday morning at 7am for Shannon Barajas. We will be doing lavage as well as biopsy. Diagnosis is lung nodules. She is expecting a call to make sure this is possible.   Thanks.

## 2015-08-20 NOTE — Telephone Encounter (Signed)
This has been scheduled for 8/4 at 7 AM over at [facility]. Nothing PO after midnight. Pt is aware. Will make Dr. Ashok Cordia aware.

## 2015-08-21 ENCOUNTER — Telehealth: Payer: Self-pay | Admitting: Pulmonary Disease

## 2015-08-21 LAB — FUNGITELL, SERUM

## 2015-08-21 NOTE — Telephone Encounter (Signed)
Ms. Rechner called to let me know that she has been taking the diflucan prescribed last week and she has been experiencing dizziness in the last 24 hours.  She believes it may be a side effect of the medication.  She plans to come for a bronchoscopy on Friday of this week.    Advised hold diflucan until bronchoscopy.  Will cc Dr. Ashok Cordia in case he feels she should use an alternative agent.

## 2015-08-22 NOTE — Telephone Encounter (Signed)
Pt is aware and needed nothing further 

## 2015-08-22 NOTE — Telephone Encounter (Signed)
That's fine. She can just continue to hold on the medication until we complete her bronchoscopy tomorrow. Thanks.

## 2015-08-23 ENCOUNTER — Ambulatory Visit (HOSPITAL_COMMUNITY): Payer: 59

## 2015-08-23 ENCOUNTER — Ambulatory Visit (HOSPITAL_COMMUNITY)
Admission: RE | Admit: 2015-08-23 | Discharge: 2015-08-23 | Disposition: A | Discharge status: Home / Self Care | Payer: 59 | Source: Ambulatory Visit | Attending: Pulmonary Disease | Admitting: Pulmonary Disease

## 2015-08-23 ENCOUNTER — Telehealth: Payer: Self-pay | Admitting: Pulmonary Disease

## 2015-08-23 ENCOUNTER — Encounter (HOSPITAL_COMMUNITY)
Admission: RE | Disposition: A | Discharge status: Home / Self Care | Payer: Self-pay | Source: Ambulatory Visit | Attending: Pulmonary Disease

## 2015-08-23 ENCOUNTER — Encounter (HOSPITAL_COMMUNITY): Payer: Self-pay | Admitting: Respiratory Therapy

## 2015-08-23 DIAGNOSIS — I7300 Raynaud's syndrome without gangrene: Secondary | ICD-10-CM | POA: Insufficient documentation

## 2015-08-23 DIAGNOSIS — K581 Irritable bowel syndrome with constipation: Secondary | ICD-10-CM | POA: Insufficient documentation

## 2015-08-23 DIAGNOSIS — R05 Cough: Secondary | ICD-10-CM | POA: Insufficient documentation

## 2015-08-23 DIAGNOSIS — Z79890 Hormone replacement therapy (postmenopausal): Secondary | ICD-10-CM | POA: Diagnosis not present

## 2015-08-23 DIAGNOSIS — R918 Other nonspecific abnormal finding of lung field: Secondary | ICD-10-CM | POA: Insufficient documentation

## 2015-08-23 DIAGNOSIS — Z79899 Other long term (current) drug therapy: Secondary | ICD-10-CM | POA: Insufficient documentation

## 2015-08-23 DIAGNOSIS — J984 Other disorders of lung: Secondary | ICD-10-CM | POA: Insufficient documentation

## 2015-08-23 DIAGNOSIS — J209 Acute bronchitis, unspecified: Secondary | ICD-10-CM | POA: Diagnosis not present

## 2015-08-23 DIAGNOSIS — Z8572 Personal history of non-Hodgkin lymphomas: Secondary | ICD-10-CM | POA: Diagnosis not present

## 2015-08-23 DIAGNOSIS — D509 Iron deficiency anemia, unspecified: Secondary | ICD-10-CM | POA: Insufficient documentation

## 2015-08-23 DIAGNOSIS — N182 Chronic kidney disease, stage 2 (mild): Secondary | ICD-10-CM | POA: Diagnosis not present

## 2015-08-23 DIAGNOSIS — Z9889 Other specified postprocedural states: Secondary | ICD-10-CM

## 2015-08-23 HISTORY — PX: VIDEO BRONCHOSCOPY: SHX5072

## 2015-08-23 LAB — BODY FLUID CELL COUNT WITH DIFFERENTIAL
EOS FL: 2 %
LYMPHS FL: 4 %
Lymphs, Fluid: 4 %
MONOCYTE-MACROPHAGE-SEROUS FLUID: 15 % — AB (ref 50–90)
Monocyte-Macrophage-Serous Fluid: 14 % — ABNORMAL LOW (ref 50–90)
NEUTROPHIL FLUID: 82 % — AB (ref 0–25)
Neutrophil Count, Fluid: 79 % — ABNORMAL HIGH (ref 0–25)
WBC FLUID: 1210 uL — AB (ref 0–1000)
WBC FLUID: 355 uL (ref 0–1000)

## 2015-08-23 SURGERY — BRONCHOSCOPY, WITH FLUOROSCOPY
Anesthesia: Moderate Sedation | Laterality: Bilateral

## 2015-08-23 MED ORDER — SODIUM CHLORIDE 0.9 % IV BOLUS (SEPSIS)
1000.0000 mL | Freq: Once | INTRAVENOUS | Status: AC
  Administered 2015-08-23: 1000 mL via INTRAVENOUS

## 2015-08-23 MED ORDER — BUTAMBEN-TETRACAINE-BENZOCAINE 2-2-14 % EX AERO
INHALATION_SPRAY | CUTANEOUS | Status: DC | PRN
  Administered 2015-08-23: 1 via TOPICAL

## 2015-08-23 MED ORDER — SODIUM CHLORIDE 0.9 % IV SOLN
Freq: Once | INTRAVENOUS | Status: AC
  Administered 2015-08-23: 07:00:00 via INTRAVENOUS

## 2015-08-23 MED ORDER — MIDAZOLAM HCL 10 MG/2ML IJ SOLN
INTRAMUSCULAR | Status: DC | PRN
  Administered 2015-08-23 (×3): 1 mg via INTRAVENOUS
  Administered 2015-08-23: 2 mg via INTRAVENOUS
  Administered 2015-08-23: 1 mg via INTRAVENOUS

## 2015-08-23 MED ORDER — SODIUM CHLORIDE 0.9 % IV SOLN: Freq: Once | INTRAVENOUS | Status: DC

## 2015-08-23 MED ORDER — FLUCONAZOLE 200 MG PO TABS: 200.0000 mg | ORAL_TABLET | Freq: Every day | ORAL | 1 refills | Status: DC

## 2015-08-23 MED ORDER — FENTANYL CITRATE (PF) 100 MCG/2ML IJ SOLN
INTRAMUSCULAR | Status: AC
  Filled 2015-08-23: qty 4

## 2015-08-23 MED ORDER — LIDOCAINE HCL 1 % IJ SOLN
INTRAMUSCULAR | Status: DC | PRN
  Administered 2015-08-23: 10 mL

## 2015-08-23 MED ORDER — MIDAZOLAM HCL 5 MG/ML IJ SOLN
INTRAMUSCULAR | Status: AC
  Filled 2015-08-23: qty 2

## 2015-08-23 MED ORDER — FENTANYL CITRATE (PF) 100 MCG/2ML IJ SOLN
INTRAMUSCULAR | Status: DC | PRN
  Administered 2015-08-23 (×4): 25 ug via INTRAVENOUS

## 2015-08-23 NOTE — Op Note (Signed)
Video Bronchoscopy Procedure Note  Pre-Procedure Diagnoses: 1.  Lung Nodules  Procedures Performed: 1. Bronchoscopy with Airway Inspection 2. Bronchoalveolar Lavage 3. Transbronchial Biopsy with Fluroscopy  Consent:  Informed consent was obtained from the patient after discussing the risks and benefits of the procedure including bleeding, infection, pneumothorax, medication allergy, vocal chord injury, and potentially death.  Conscious Sedation:   Time of First Medication Administration:  7:14am Time of Last Medication Administration:  7:45am Time Physicially Left Room:  8:00am  Medications Administered During Conscious Sedation: 1. Lidocaine 1% gargle 10cc 2. Benzocaine 20% spray retropharyngeal  3. Lidocaine 1% 12cc via bronchoscope 4. Versed 6mg  IV 5. Fentanyl 153mcg IV 6. NS 1L Bolus over 30 min  Pre-Procedure Physical Exam: General:  No acute distress. Awake. Alert. ASA Class 2. HEENT:  Moist mucus membranes. No oral ulcers. Mallampati Class 2. Cardiovascular:  Regular rate. No edema. No appreciable JVD. Pulmonary:  Clear to auscultation with good aeration bilaterally. Normal work of breathing. Abdomen:  Soft. Nontender. Nondistended. Normal bowel sounds. Musculoskeletal:  Normal bulk and tone. Normal neck flexion & extension. Neurological:  Oriented to person, place, and time. Moving all 4 extremities equally.  Description of Procedure: Patient was brought back to the endoscopy procedure room.  A time out was performed to identify the correct patient and procedure.  Patient's blood pressure  pre-procedure was noted to be borderline low. A gentle bolus of normal saline was then started and continued through the procedure.  Lidocaine gargle was performed.  Patient did have residual gag reflex therefore benzocaine spray was administered retropharyngeal. Patient was laid recumbent and conscious sedation was administered by respiratory therapy under my direction.  Bite block was  inserted and towel was placed over the patient's eyes.  Flexible bronchoscope was then inserted into the posterior pharynx until vocal chords were in full view. There was no abnormality of the vocal chords or arytenoids.  A total of 6cc of Lidocaine was used to anesthetize the vocal chords.  The bronchoscope was then inserted between the vocal chords with ease into the proximal trachea.  Lidocaine was then used to anesthetize the patient's proximal airways.  An airway inspeciton was performed finding and minimal amount of patchy, white, adherent material in the origin of the right upper lobe bronchus.  There was mucus plugging with more white, patchy, adherent material in the right middle lobe bronchus with some airway erythema. The remainder of the airways bilaterally were normal with thin, clear secretions. The bronchoscope was then advanced into the right middle lobe.  A lavage was performed by instilling a total of 180cc of sterile saline and aspirating a total of 75cc of cloudy fluid.  The bronchoscope was then repositioned into the lingula where a second lavage was performed by instilling a total of 120 mL of sterile saline and aspirated a total of 40 mL of cloudy fluid.  Transbronchial biopsies were then performed under fluroscopy in the right middle lobe.  A total of 4 specimens were obtained.  No immediate pneumothorax was identified with fluroscopy.  The flexible bronchoscope was then removed from the patient's airways after suctioning of the remaining secretions. The bite block was removed and the patient was returned to the upright position.  Blood Loss:  Less than 5cc.  Complications:  None.  Bronchoalveolar Lavage: 1. Performed on the Right Middle Lobe:  Sent for cell count, Cytology, Aspergillus Antigen, Fungal smear & culture, AFB smear & culture, and routine culture. 2. Performed on the Lingula Lobe:  Sent  for cell count, Cytology, Aspergillus Antigen, Fungal smear & culture, AFB smear &  culture, and routine culture.  Transbronchial Biopsy: 1. Performed on the Right Middle Lobe:  Sent for pathology, AFB Smear/Culture. and Fungal Culture.  Post Procedure Stat Portable CXR:  No pneumothorax on my review.  Post Procedure Instructions: Personally spoke with the patient's husband relaying the preliminary results of the procedure.  Instructed him that the patient is not to operate a car for 24 hours.  The patient should seek immediate medical attention if there is any persistent or progressive hemoptysis, difficulty breathing, or chest pain/discomfort.  The patient should also notify me immediately or seek medical attention for any purulent sputum production or fever occuring today or in the coming days.  The patient will be contacted by me once the final results of the studies are available.  The patient should call my office if they have any questions. Patient is to resume her Diflucan at a dose of 200mg  daily and continue with the planned serum lab testing.

## 2015-08-23 NOTE — Telephone Encounter (Signed)
JN  Please advise on the diflucan rx that was sent in.  Pharmacy calling to verify the quantity given.  #60 and it states to take one tablet by mouth daily.  Please advise thanks.

## 2015-08-23 NOTE — H&P (View-Only) (Signed)
Subjective:    Patient ID: Shannon Barajas, female    DOB: Oct 09, 1950, 65 y.o.   MRN: [id number]  HPI She was in [place], [place], visiting her daughter and returned on 6/4. She reports started on 6/5 she noted she had sinus congestion. She reports postnasal drainage. She was able to blow out a yellow mucus. She reports her cough was nonproductive. This was treated with Augmentin. She reports she continued to have a nonproductive cough and was treated with a course of Omnicef. She reports she also noticed that she was having night sweats. With ongoing symptoms for 3 weeks she was treated with Prednisone without any improvement in her cough. She was seen again last week and had an abnormal CXR that prompted a CT scan of the chest. She started taking Levaquin on Tuesday. She reports her cough started improving. She reports she has to take a hydrocodone cough syrup to help her sleep and decrease her cough. Her husband also had a significant cough that was only rarely productive of a yellow mucus. His symptoms started about 1 week after they returned. He was diagnosed with bronchitis. Her husband's cough seems to have been improving over the last week as well. She continues to have sweats. Denies any fever or chills. Denies any other sick contacts. No adenopathy in her neck, groin, or axilla.  Review of Systems She reports chest pain, pronounced 2 weeks ago that started in her right chest and radiated down her right arm. She reports her pain is relieved with tylenol. Reports some occasional pleurisy. No abdominal pain, diarrhea, or nausea. Has chronic constipation. A pertinent 14 point review of systems is negative except as per the history of presenting illness.  Allergies  Allergen Reactions  . Biaxin [Clarithromycin]     REACTION: aggrevates IBS  . Erythromycin     REACTION: aggrevates IBS  . Soy Allergy     May arregevate interstitial cystitis  . Zithromax [Azithromycin]   . Zofran [Ondansetron  Hcl]     unknown    Current Outpatient Prescriptions on File Prior to Visit  Medication Sig Dispense Refill  . cholecalciferol (VITAMIN D) 1000 units tablet Take 1,000 Units by mouth daily. Reported on 07/01/2015    . estradiol (ESTRACE) 2 MG tablet Take 2 mg by mouth every evening. Stop taking for 12 days every 90 days while taking progesterone    . Fe Fum-FePoly-Vit C-Vit B3 (INTEGRA) 62.5-62.5-40-3 MG CAPS Take 1 capsule by mouth daily. 30 capsule 1  . folic acid (FOLVITE) 1 MG tablet TAKE 1 TABLET BY MOUTH DAILY. 30 tablet PRN  . HYDROcodone-homatropine (HYCODAN) 5-1.5 MG/5ML syrup Take 5 mLs by mouth every 4 (four) hours as needed. 120 mL 0  . Linaclotide (LINZESS) 290 MCG CAPS capsule Take 1 capsule (290 mcg total) by mouth every morning. 30 capsule 5  . metoCLOPramide (REGLAN) 5 MG tablet TAKE 1 TABLET BY MOUTH 2 (TWO) TIMES DAILY AT 10 AM AND 4 PM. 180 tablet 1  . progesterone (PROMETRIUM) 100 MG capsule Take 100 mg by mouth as directed. Reported on 07/01/2015    . temazepam (RESTORIL) 30 MG capsule Take 2 capsules (60 mg total) by mouth at bedtime. 60 capsule 1  . traZODone (DESYREL) 50 MG tablet Take 50 mg by mouth at bedtime.     No current facility-administered medications on file prior to visit.     Past Medical History:  Diagnosis Date  . Chronic interstitial cystitis   . Chronic kidney  disease (CKD), stage II (mild) 05/25/2014  . Diverticulosis    mild   . GERD (gastroesophageal reflux disease)   . H/O: iron deficiency anemia   . Hemorrhoids   . Hemorrhoids 07/26/2013  . Hiatal hernia    small   . IBS (irritable bowel syndrome)   . Insomnia, unspecified   . Interstitial cystitis   . 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 Non Hodgkin's lymphoma ([place]) 04/19/2013  . Raynaud's syndrome   . Splenomegaly   . Stricture and stenosis of esophagus   . Thrombocytopenia, unspecified ([place])   . Unspecified constipation     Past Surgical History:  Procedure  Laterality Date  . APPENDECTOMY  2012  . BALLOON DILATION N/A 09/06/2014   Procedure: BALLOON DILATION;  Surgeon: [name], MD;  Location: [facility];  Service: Endoscopy;  Laterality: N/A;  . BREAST LUMPECTOMY    . ESOPHAGEAL DILATION    . ESOPHAGOGASTRODUODENOSCOPY (EGD) WITH PROPOFOL N/A 09/06/2014   Procedure: ESOPHAGOGASTRODUODENOSCOPY (EGD) WITH PROPOFOL;  Surgeon: [name], MD;  Location: [facility];  Service: Endoscopy;  Laterality: N/A;  . TONSILLECTOMY    . TUBAL LIGATION      Family History  Problem Relation Age of Onset  . Lymphoma Father     lymphoma  . Cirrhosis Father     heavy drinker  . Diabetes Maternal Grandmother   . Colon cancer Maternal Grandfather   . Cancer Maternal Grandfather     liver ca ?    Social History   Social History  . Marital status: Married    Spouse name: N/A  . Number of children: 2  . Years of education: N/A   Occupational History  . [employer]     Social History Main Topics  . Smoking status: Passive Smoke Exposure - Never Smoker    Types: Cigarettes  . Smokeless tobacco: Never Used     Comment: Father growing up & daughter currently  . Alcohol use No  . Drug use: No  . Sexual activity: Not Asked   Other Topics Concern  . None   Social History Narrative   Originally from [place]. She moved to [place] in the 1980s. 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 No pets currently. No bird exposure. 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 Enjoys spending time with family & reading.         Objective:   Physical Exam BP 90/62 (BP Location: Left Arm, Cuff Size: Normal)   Pulse 84   Ht _0  (1.702 m)   Wt 141 lb 12.8 oz (64.3 kg)   SpO2 100%   BMI 22.21 kg/m  General:  Awake. Alert. No acute distress. Husband with her today.  Integument:  Warm & dry. No rash on exposed skin. No bruising. Lymphatics:  No appreciated cervical or supraclavicular  lymphadenoapthy. HEENT:  Moist mucus membranes. No oral ulcers. No scleral injection or icterus.Erythema of the external auditory canal. Cardiovascular:  Regular rate. No edema. No appreciable JVD.  Pulmonary:  Good aeration & clear to auscultation bilaterally. Symmetric chest wall expansion. No accessory muscle use on room air. Abdomen: Soft. Normal bowel sounds. Nondistended. Grossly nontender. Musculoskeletal:  Normal bulk and tone. Hand grip strength 5/5 bilaterally. No joint deformity or effusion appreciated. Neurological:  CN 2-12 grossly in tact. No meningismus. Moving all 4 extremities equally. Symmetric brachioradialis deep tendon reflexes. Psychiatric:  Mood and  affect congruent. Speech normal rhythm, rate & tone.   IMAGING CT CHEST W/O 08/12/15 (personally reviewed by me): Patchy nodular and groundglass opacities within the medial and lateral segments of the right middle lobe. Additional solid and groundglass nodules within the inferior segment of the lingula. No pleural effusion or thickening appreciated. No pericardial effusion. No pathologic mediastinal adenopathy. These findings were not present on CT imaging from 2008.  LABS 09/05/13 IgG:  492 IgA:  50  11/08/09 ANA:  Negative RF:  <20 SSA:  1 SSB:  <1 IgG:  716 IgA:  91    Assessment & Plan:  65 year old female found to have bilateral lung nodules on CT imaging.Given patient's travel to [place], constellation of symptoms, and the fact that her husband was also ill I'm highly suspicious that she may have contracted valley fever from Coccidioides immitis. Without symptomatic benefit from antibiotic therapy I do not feel that continuing Levaquin at this time is necessary. It's possible that she may have an autoimmune/alternative inflammatory process but this is unlikely given her previous serum testing. Even so, I am repeating this testing today. Given the pattern on CT imaging I am less suspicious about a  malignant process but this must still remains in the differential with her history of lymphoma. I instructed the patient contact my office if she had any further questions or concerns before her next appointment.  1. Bilateral Lung Nodules:  Suspect secondary to Coccidioides. Initiating empiric treatment with Diflucan 400 mg by mouth daily. Checking serum CBC, CMP, beta D glucan, Aspergillus antigen, Procalcitonin, ESR, CRP, Coccidioides antibodies, ANA with reflex to comprehensive panel, SSA, SSB, rheumatoid factor, & anti-CCP. Also checking urine Histoplasma and Blastomyces antigens. Plan to repeat CBC & LFTs in 2-4 weeks. Patient will need follow-up CT imaging in 4-6 weeks to ensure no signs of progression. Plan to refer to infectious diseases if Coccidioides antibodies are positive. Advised patient's husband he may need testing as well. 2. Cough: Likely secondary to underlying inflammatory process involving lung nodules. Continuing cough suppression. 3. Follow-up: Return to clinic in 4 weeks.  Sonia Baller Ashok Cordia, M.D. [facility] Pulmonary & Critical Care Pager:  [phone] After 3pm or if no response, call [phone] 1:53 PM 08/15/15

## 2015-08-23 NOTE — Discharge Instructions (Signed)
Flexible Bronchoscopy, Care After These instructions give you information on caring for yourself after your procedure. Your doctor may also give you more specific instructions. Call your doctor if you have any problems or questions after your procedure. HOME CARE  Do not eat or drink anything for 2 hours after your procedure. If you try to eat or drink before the medicine wears off, food or drink could go into your lungs. You could also burn yourself.  After 2 hours have passed and when you can cough and gag normally, you may eat soft food and drink liquids slowly.  The day after the test, you may eat your normal diet.  You may do your normal activities.  Keep all doctor visits. GET HELP RIGHT AWAY IF:  You get more and more short of breath.  You get light-headed.  You feel like you are going to pass out (faint).  You have chest pain.  You have new problems that worry you.  You cough up more than a little blood.  You cough up more blood than before. MAKE SURE YOU:  Understand these instructions.  Will watch your condition.  Will get help right away if you are not doing well or get worse.   This information is not intended to replace advice given to you by your health care provider. Make sure you discuss any questions you have with your health care provider.   Do not eat or drink anything until on 0900 08/23/2015.   Document Released: 11/02/2008 Document Revised: 01/10/2013 Document Reviewed: 09/09/2012 Elsevier Interactive Patient Education [employer].

## 2015-08-23 NOTE — Telephone Encounter (Signed)
Called spoke with Aaron Edelman at [facility], advised of JN's recommendations as stated below.  Aaron Edelman stated that per pt's insurance, they will not be able to increase the dose off this Rx but he can go ahead and fill Rx for 30 day supply.  Nothing further needed; will sign off.

## 2015-08-23 NOTE — Interval H&P Note (Signed)
History and Physical Interval Note:  08/23/2015 7:10 AM  Shannon Barajas  has presented today for surgery, with the diagnosis of Lung nodules.  The various methods of treatment have been discussed with the patient and family. After consideration of risks, benefits and other options for treatment, the patient has consented to  Procedure(s): VIDEO BRONCHOSCOPY WITH FLUORO (Bilateral) as a surgical intervention .  The patient's history has been reviewed, patient examined, no change in status, stable for surgery.  I have reviewed the patient's chart and labs.  Questions were answered to the patient's satisfaction.     Sonia Baller Ashok Cordia, M.D. [facility] Pulmonary & Critical Care Pager:  [phone] After 3pm or if no response, call [phone] 7:10 AM 08/23/15

## 2015-08-23 NOTE — Progress Notes (Signed)
Video bronchoscopy performed.  Intervention bronchial washing. Intervention bronchial biopsy.  1L saline bolus given during procedure due to blood pressure slightly low.  No complications noted.

## 2015-08-23 NOTE — Telephone Encounter (Signed)
Yes. Please keep the number the same because we may need to increase the dose again. Thank you.

## 2015-08-24 LAB — ACID FAST SMEAR (AFB)
ACID FAST SMEAR - AFSCU2: NEGATIVE
ACID FAST SMEAR - AFSCU2: NEGATIVE
ACID FAST SMEAR - AFSCU2: NEGATIVE

## 2015-08-24 LAB — ACID FAST SMEAR (AFB, MYCOBACTERIA)

## 2015-08-25 LAB — CULTURE, RESPIRATORY W GRAM STAIN
Culture: NORMAL
Special Requests: NORMAL

## 2015-08-25 LAB — CULTURE, RESPIRATORY
CULTURE: NORMAL
SPECIAL REQUESTS: NORMAL

## 2015-08-26 ENCOUNTER — Encounter (HOSPITAL_COMMUNITY): Payer: Self-pay | Admitting: Pulmonary Disease

## 2015-08-26 ENCOUNTER — Telehealth: Payer: Self-pay | Admitting: Pulmonary Disease

## 2015-08-26 DIAGNOSIS — Z0000 Encounter for general adult medical examination without abnormal findings: Secondary | ICD-10-CM | POA: Diagnosis not present

## 2015-08-26 DIAGNOSIS — R358 Other polyuria: Secondary | ICD-10-CM | POA: Diagnosis not present

## 2015-08-26 DIAGNOSIS — J209 Acute bronchitis, unspecified: Secondary | ICD-10-CM

## 2015-08-26 DIAGNOSIS — R8299 Other abnormal findings in urine: Secondary | ICD-10-CM | POA: Diagnosis not present

## 2015-08-26 LAB — ASPERGILLUS ANTIGEN, BAL/SERUM: Aspergillus Ag, BAL/Serum: 0.14 Index (ref 0.00–0.49)

## 2015-08-26 NOTE — Telephone Encounter (Signed)
Spoke with pt and gave lab/culture results. She would like to know JN thinks is wrong, as it is not cancer. She also wants to know how long to continue the diflucan. Pt aware we will send message to [facility] and call her once we receive reply.  JN - Please advise as to above. Thanks!

## 2015-08-27 MED ORDER — HYDROCODONE-HOMATROPINE 5-1.5 MG/5ML PO SYRP: 5.0000 mL | ORAL_SOLUTION | Freq: Four times a day (QID) | ORAL | 0 refills | Status: DC | PRN

## 2015-08-27 MED FILL — HYDROCODONE-HOMATROPINE SYR: 5-1.5 | 6 days supply | Qty: 120 | Fill #0

## 2015-08-27 NOTE — Telephone Encounter (Signed)
Called spoke with pt and made aware of below. She verbalized understanding. She is now requesting a refill on her hycodan cough syrup. She reports she only coughs at night and reports she has hard time taking OTC cough medications. Pt aware JN is not in the office until tomorrow morning. Please advise thanks

## 2015-08-27 NOTE — Telephone Encounter (Signed)
RX has been printed, signed and placed for pick up.  Called and made pt aware. Nothing further needed

## 2015-08-27 NOTE — Telephone Encounter (Signed)
That is fine with me. Do not give any refills on it though.

## 2015-08-27 NOTE — Telephone Encounter (Signed)
I'm still highly suspicious about the potential for a fungal infection. I explained that it can take time for the culture to grow and finalize. If she is not noticing any more "dizziness" or adverse effects from the Diflucan she can increase her dose back to 400mg  daily and see if she is able to tolerate it. If not then continue taking the 200mg  daily. Thanks.

## 2015-09-02 DIAGNOSIS — Z6822 Body mass index (BMI) 22.0-22.9, adult: Secondary | ICD-10-CM | POA: Diagnosis not present

## 2015-09-02 DIAGNOSIS — K589 Irritable bowel syndrome without diarrhea: Secondary | ICD-10-CM | POA: Diagnosis not present

## 2015-09-02 DIAGNOSIS — G4700 Insomnia, unspecified: Secondary | ICD-10-CM | POA: Diagnosis not present

## 2015-09-02 DIAGNOSIS — M859 Disorder of bone density and structure, unspecified: Secondary | ICD-10-CM | POA: Diagnosis not present

## 2015-09-02 DIAGNOSIS — F419 Anxiety disorder, unspecified: Secondary | ICD-10-CM | POA: Diagnosis not present

## 2015-09-02 DIAGNOSIS — C8580 Other specified types of non-Hodgkin lymphoma, unspecified site: Secondary | ICD-10-CM | POA: Diagnosis not present

## 2015-09-02 DIAGNOSIS — N183 Chronic kidney disease, stage 3 (moderate): Secondary | ICD-10-CM | POA: Diagnosis not present

## 2015-09-02 DIAGNOSIS — R918 Other nonspecific abnormal finding of lung field: Secondary | ICD-10-CM | POA: Diagnosis not present

## 2015-09-02 DIAGNOSIS — Z1389 Encounter for screening for other disorder: Secondary | ICD-10-CM | POA: Diagnosis not present

## 2015-09-12 MED FILL — TEMAZEPAM 30 MG CAPSULE: 30 | 30 days supply | Qty: 60 | Fill #2

## 2015-09-18 ENCOUNTER — Other Ambulatory Visit: Payer: 59

## 2015-09-18 ENCOUNTER — Telehealth: Payer: Self-pay | Admitting: Pulmonary Disease

## 2015-09-18 DIAGNOSIS — R059 Cough, unspecified: Secondary | ICD-10-CM

## 2015-09-18 DIAGNOSIS — R918 Other nonspecific abnormal finding of lung field: Secondary | ICD-10-CM

## 2015-09-18 DIAGNOSIS — R05 Cough: Secondary | ICD-10-CM

## 2015-09-18 NOTE — Telephone Encounter (Signed)
Lab called today and stated that the pt came in for labs today but there were no orders in the pts chart of what was needed.  Pt has an appt with JN on 9/7.  mindy reviewed the chart as well and couldn't find any orders pt needed.  JN please advise if pt will need labs done prior to her appt with you.  thanks

## 2015-09-19 NOTE — Telephone Encounter (Signed)
Pt aware that labs have been placed, pt states she will try to come by next week and have these done. Pt had no further questions or concerns. Nothing further needed.

## 2015-09-19 NOTE — Telephone Encounter (Signed)
Patient should have a CBC with differential & Hepatic Function Panel.

## 2015-09-23 LAB — FUNGAL ORGANISM REFLEX

## 2015-09-23 LAB — FUNGUS CULTURE RESULT

## 2015-09-23 LAB — FUNGUS CULTURE WITH STAIN

## 2015-09-24 MED FILL — INTEGRA CAPSULE: 62.5-62.5-4 | 90 days supply | Qty: 90 | Fill #0

## 2015-09-25 ENCOUNTER — Other Ambulatory Visit (INDEPENDENT_AMBULATORY_CARE_PROVIDER_SITE_OTHER): Payer: 59

## 2015-09-25 DIAGNOSIS — R05 Cough: Secondary | ICD-10-CM

## 2015-09-25 DIAGNOSIS — R059 Cough, unspecified: Secondary | ICD-10-CM

## 2015-09-25 DIAGNOSIS — R918 Other nonspecific abnormal finding of lung field: Secondary | ICD-10-CM | POA: Diagnosis not present

## 2015-09-25 LAB — CBC WITH DIFFERENTIAL/PLATELET
BASOS ABS: 0 10*3/uL (ref 0.0–0.1)
Basophils Relative: 0.5 % (ref 0.0–3.0)
EOS ABS: 0.1 10*3/uL (ref 0.0–0.7)
Eosinophils Relative: 1.1 % (ref 0.0–5.0)
HCT: 38.7 % (ref 36.0–46.0)
Hemoglobin: 13.1 g/dL (ref 12.0–15.0)
LYMPHS ABS: 2.3 10*3/uL (ref 0.7–4.0)
Lymphocytes Relative: 30.2 % (ref 12.0–46.0)
MCHC: 33.8 g/dL (ref 30.0–36.0)
MCV: 89.4 fl (ref 78.0–100.0)
Monocytes Absolute: 0.6 10*3/uL (ref 0.1–1.0)
Monocytes Relative: 7.2 % (ref 3.0–12.0)
NEUTROS ABS: 4.7 10*3/uL (ref 1.4–7.7)
NEUTROS PCT: 61 % (ref 43.0–77.0)
RBC: 4.33 Mil/uL (ref 3.87–5.11)
RDW: 13.3 % (ref 11.5–15.5)
WBC: 7.8 10*3/uL (ref 4.0–10.5)

## 2015-09-25 LAB — HEPATIC FUNCTION PANEL
ALBUMIN: 4.1 g/dL (ref 3.5–5.2)
ALK PHOS: 104 U/L (ref 39–117)
ALT: 12 U/L (ref 0–35)
AST: 15 U/L (ref 0–37)
BILIRUBIN DIRECT: 0.1 mg/dL (ref 0.0–0.3)
Total Bilirubin: 0.4 mg/dL (ref 0.2–1.2)
Total Protein: 5.9 g/dL — ABNORMAL LOW (ref 6.0–8.3)

## 2015-09-26 ENCOUNTER — Ambulatory Visit (INDEPENDENT_AMBULATORY_CARE_PROVIDER_SITE_OTHER): Payer: 59 | Admitting: Pulmonary Disease

## 2015-09-26 VITALS — BP 122/60 | HR 74 | Wt 140.6 lb

## 2015-09-26 DIAGNOSIS — R918 Other nonspecific abnormal finding of lung field: Secondary | ICD-10-CM | POA: Diagnosis not present

## 2015-09-26 NOTE — Patient Instructions (Addendum)
   Please call me if you have any new breathing problems or you notice your cough has started to come back.  I will see you back in 6 weeks or sooner if needed.  TESTS ORDERED: 1. CT Chest w/o

## 2015-09-26 NOTE — Progress Notes (Signed)
Subjective:    Patient ID: Shannon Barajas, female    DOB: 21-May-1950, 65 y.o.   MRN: [id number]  [facility].:  Follow-up for Bilateral Lung Nodules & Cough.  HPI Bilateral Lung Nodules:The patient underwent bronchoscopy with lavage and transbronchial biopsies since last appointment which has thus far been negative on culture and pathology. Treated empirically with Diflucan for probable Coccidioides infection. No rashes or abnormal bruising.  Cough:  She reports her cough is totally gone.   Review of Systems She denies any fever, chills, or sweats. No chest tightness or pain. No nausea, emesis, or abdominal pain.   Allergies  Allergen Reactions  . Biaxin [Clarithromycin]     REACTION: aggrevates IBS  . Erythromycin     REACTION: aggrevates IBS  . Soy Allergy     May arregevate interstitial cystitis  . Zithromax [Azithromycin]   . Zofran [Ondansetron Hcl]     unknown    Current Outpatient Prescriptions on File Prior to Visit  Medication Sig Dispense Refill  . cholecalciferol (VITAMIN D) 1000 units tablet Take 1,000 Units by mouth daily. Reported on 07/01/2015    . estradiol (ESTRACE) 2 MG tablet Take 2 mg by mouth every evening. Stop taking for 12 days every 90 days while taking progesterone    . Fe Fum-FePoly-Vit C-Vit B3 (INTEGRA) 62.5-62.5-40-3 MG CAPS Take 1 capsule by mouth daily. 30 capsule 1  . folic acid (FOLVITE) 1 MG tablet TAKE 1 TABLET BY MOUTH DAILY. 30 tablet PRN  . HYDROcodone-homatropine (HYCODAN) 5-1.5 MG/5ML syrup Take 5 mLs by mouth every 6 (six) hours as needed. 120 mL 0  . Linaclotide (LINZESS) 290 MCG CAPS capsule Take 1 capsule (290 mcg total) by mouth every morning. 30 capsule 5  . metoCLOPramide (REGLAN) 5 MG tablet TAKE 1 TABLET BY MOUTH 2 (TWO) TIMES DAILY AT 10 AM AND 4 PM. 180 tablet 1  . progesterone (PROMETRIUM) 100 MG capsule Take 100 mg by mouth as directed. Reported on 07/01/2015    . temazepam (RESTORIL) 30 MG capsule Take 2 capsules (60 mg total) by  mouth at bedtime. 60 capsule 1  . traZODone (DESYREL) 50 MG tablet Take 50 mg by mouth at bedtime.    . ALPRAZolam (XANAX) 0.5 MG tablet Take 0.5 mg by mouth 3 (three) times daily as needed for anxiety.    . fluconazole (DIFLUCAN) 200 MG tablet Take 1 tablet (200 mg total) by mouth daily. (Patient not taking: Reported on 09/26/2015) 60 tablet 1   No current facility-administered medications on file prior to visit.     Past Medical History:  Diagnosis Date  . Chronic interstitial cystitis   . Chronic kidney disease (CKD), stage II (mild) 05/25/2014  . Diverticulosis    mild   . GERD (gastroesophageal reflux disease)   . H/O: iron deficiency anemia   . Hemorrhoids   . Hemorrhoids 07/26/2013  . Hiatal hernia    small   . IBS (irritable bowel syndrome)   . Insomnia, unspecified   . Interstitial cystitis   . 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 Non Hodgkin's lymphoma ([place]) 04/19/2013  . Raynaud's syndrome   . Splenomegaly   . Stricture and stenosis of esophagus   . Thrombocytopenia, unspecified ([place])   . Unspecified constipation     Past Surgical History:  Procedure Laterality Date  . APPENDECTOMY  2012  . BALLOON DILATION N/A 09/06/2014   Procedure: BALLOON DILATION;  Surgeon: [name], MD;  Location: [facility];  Service: Endoscopy;  Laterality: N/A;  . BREAST LUMPECTOMY    . ESOPHAGEAL DILATION    . ESOPHAGOGASTRODUODENOSCOPY (EGD) WITH PROPOFOL N/A 09/06/2014   Procedure: ESOPHAGOGASTRODUODENOSCOPY (EGD) WITH PROPOFOL;  Surgeon: [name], MD;  Location: [facility];  Service: Endoscopy;  Laterality: N/A;  . TONSILLECTOMY    . TUBAL LIGATION    . VIDEO BRONCHOSCOPY Bilateral 08/23/2015   Procedure: VIDEO BRONCHOSCOPY WITH FLUORO;  Surgeon: Javier Glazier, MD;  Location: [facility];  Service: Cardiopulmonary;  Laterality: Bilateral;    Family History  Problem Relation Age of Onset  . Lymphoma Father     lymphoma  . Cirrhosis Father     heavy  drinker  . Diabetes Maternal Grandmother   . Colon cancer Maternal Grandfather   . Cancer Maternal Grandfather     liver ca ?    Social History   Social History  . Marital status: Married    Spouse name: N/A  . Number of children: 2  . Years of education: N/A   Occupational History  . [employer]     Social History Main Topics  . Smoking status: Passive Smoke Exposure - Never Smoker    Types: Cigarettes  . Smokeless tobacco: Never Used     Comment: Father growing up & daughter currently  . Alcohol use No  . Drug use: No  . Sexual activity: Not on file   Other Topics Concern  . Not on file   Social History Narrative   Originally from [place]. She moved to [place] in the 1980s. 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 No pets currently. No bird exposure. 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 Enjoys spending time with family & reading.         Objective:   Physical Exam BP 122/60 (BP Location: Left Arm, Cuff Size: Normal)   Pulse 74   Wt 140 lb 9.6 oz (63.8 kg)   SpO2 98%   BMI 22.02 kg/m  General:  Awake. Alert. No distress. Husband with her today.  Integument:  Warm & dry. No rash on exposed skin. Lymphatics:  No appreciated cervical or supraclavicular lymphadenoapthy. HEENT:  Moist mucus membranes. No oral ulcers. No scleral icterus. Cardiovascular:  Regular rate. No edema. Normal S1 & S2. Pulmonary: Clear to auscultation bilaterally. Normal work of breathing on room air.   IMAGING CT CHEST W/O 08/12/15 (previously reviewed by me): Patchy nodular and groundglass opacities within the medial and lateral segments of the right middle lobe. Additional solid and groundglass nodules within the inferior segment of the lingula. No pleural effusion or thickening appreciated. No pericardial effusion. No pathologic mediastinal adenopathy. These findings were not present on CT imaging from 2008.  MICROBIOLOGY BAL  RML (08/23/15):  Normal Respiratory Flora / Fungus negative / AFB pending BAL Lingula (08/23/15): Normal Respiratory Flora / Fungus negative / AFB pending RML Transbronchial Biopsy (08/23/15):  Fungus negative / AFB pending Aspergillus Antigen BAL (08/23/15):  0.14 Fungitell (08/19/15):  <31 Procalcitonin (08/15/15):  <0.1 Aspergillus Antigen Serum (08/15/15):  <0.5 Coccidioides Ab (08/15/15):  <1:2 Urine Histo Ag (08/15/15):  <0.5 Urine Blasto Ag (08/15/15):  Negative   PATHOLOGY RML Transbronchial Biopsy (08/23/15):  No malignancy BAL RML (08/23/15):  Acute inflammation BAL Lingula (08/23/15):  Acute inflammation  LABS 09/25/15 LFT:   4.1/5.9/0.4/104/15/12 CBC:  7.8/13.1/38.7/107  08/23/15 BAL RML:  WBC 1210 (79% neutro, 15% macro, 4% lymph, 2% eos) BAL  Lingula:  WBC 355 (82% neutro, 14% macro, 4% lymph)  08/15/15 CBC: 11.8/12.1/36.3/140 BMP: 138/3.8/104/29/27/1.09/91/9.2 LFT: 3.8/6.1/0.4/93/12/10 ESR: 12 CRP:  0.6 ANA:  Negative  SSA:  <1.0 SSB:  <1.0 RNP Ab:  <0.2 Smith Ab:  <0.2 RF:  <10 Anti-CCP:  <16 Chromatin Ab:  <0.2 SCL-70:  <0.2 DS DNA Ab:  <1 Anti-Jo 1:  <0.2  09/05/13 IgG:  492 IgA:  50  11/08/09 ANA:  Negative RF:  <20 SSA:  1 SSB:  <1 IgG:  716 IgA:  91    Assessment & Plan:  65 y.o. female found to have bilateral lung nodules on CT imaging likely secondary to Coccidiodes.  I reviewed the patient's extensive pathology and serology as well as microbiology results from her bronchoscopy with her and her husband today. With resolution of her underlying cough I believe that no further antifungal treatment is required at this time. We will need to repeat CT imaging to assess her bilateral lung nodules to ensure they have resolved or at least are not enlarging. We also addressed the fact that she has mild thrombocytopenia this is of unclear significance. I instructed the patient to contact my office if she had any new breathing problems or cough recurred.   1. Bilateral  Lung Nodules:  Plan to repeat CT chest without contrast to ensure resolution. 2. Cough: Resolved. 3. Thrombocytopenia: Sent a message to Dr. Alvy Bimler to see when she would recommend repeating cell counts.  4. Follow-up: Return to clinic in 6 weeks or sooner if needed.  Sonia Baller Ashok Cordia, M.D. [facility] Pulmonary & Critical Care Pager:  [phone] After 3pm or if no response, call [phone] 3:11 PM 09/26/15

## 2015-10-04 ENCOUNTER — Telehealth: Payer: Self-pay | Admitting: *Deleted

## 2015-10-04 ENCOUNTER — Other Ambulatory Visit: Payer: Self-pay | Admitting: Obstetrics & Gynecology

## 2015-10-04 DIAGNOSIS — N6320 Unspecified lump in the left breast, unspecified quadrant: Secondary | ICD-10-CM

## 2015-10-04 NOTE — Telephone Encounter (Signed)
Pt concerned about her platelet count.  She says they were 167 at PCP on 08/26/15 and then they dropped to 107 last week when checked by Dr. Ashok Cordia.  She does not see Dr. Alvy Bimler again until November.  She asks if she should see Dr. Alvy Bimler sooner or have labs checked sooner?

## 2015-10-04 NOTE — Telephone Encounter (Signed)
She will continue to have fluctuation of platelet count. As long as she stays >50,000, she will not be in any danger I have spoken with Dr. Ashok Cordia recently She does not need her labs rechecked sooner

## 2015-10-04 NOTE — Telephone Encounter (Signed)
Informed pt of Dr. Gorsuch's message. She verbalized understanding.  

## 2015-10-05 ENCOUNTER — Encounter: Payer: Self-pay | Admitting: Pulmonary Disease

## 2015-10-05 LAB — ACID FAST CULTURE WITH REFLEXED SENSITIVITIES (MYCOBACTERIA): Acid Fast Culture: NEGATIVE

## 2015-10-05 LAB — ACID FAST CULTURE WITH REFLEXED SENSITIVITIES
ACID FAST CULTURE - AFSCU3: NEGATIVE
ACID FAST CULTURE - AFSCU3: NEGATIVE

## 2015-10-08 ENCOUNTER — Inpatient Hospital Stay: Admission: RE | Admit: 2015-10-08 | Payer: 59 | Source: Ambulatory Visit

## 2015-10-09 ENCOUNTER — Ambulatory Visit (INDEPENDENT_AMBULATORY_CARE_PROVIDER_SITE_OTHER)
Admission: RE | Admit: 2015-10-09 | Discharge: 2015-10-09 | Disposition: A | Discharge status: Home / Self Care | Payer: 59 | Source: Ambulatory Visit | Attending: Pulmonary Disease | Admitting: Pulmonary Disease

## 2015-10-09 DIAGNOSIS — R918 Other nonspecific abnormal finding of lung field: Secondary | ICD-10-CM | POA: Diagnosis not present

## 2015-10-09 DIAGNOSIS — J189 Pneumonia, unspecified organism: Secondary | ICD-10-CM | POA: Diagnosis not present

## 2015-10-11 MED FILL — ESTRADIOL 1 MG TABLET: 1 | 90 days supply | Qty: 90 | Fill #2

## 2015-10-11 MED FILL — LINZESS 290 MCG CAPSULE: 290 | 90 days supply | Qty: 90 | Fill #0

## 2015-10-14 MED FILL — TEMAZEPAM 30 MG CAPSULE: 30 | 30 days supply | Qty: 60 | Fill #0

## 2015-10-23 DIAGNOSIS — Z01419 Encounter for gynecological examination (general) (routine) without abnormal findings: Secondary | ICD-10-CM | POA: Diagnosis not present

## 2015-10-23 DIAGNOSIS — Z6822 Body mass index (BMI) 22.0-22.9, adult: Secondary | ICD-10-CM | POA: Diagnosis not present

## 2015-10-23 MED FILL — FOLIC ACID 1 MG TABLET: 1 | 90 days supply | Qty: 90 | Fill #2 | Status: TO

## 2015-11-04 MED FILL — traZODone HCL 50 MG TABS: 50 | 90 days supply | Qty: 90 | Fill #2

## 2015-11-04 MED FILL — PROGESTERONE 100 MG CAPSULE: 100 | 30 days supply | Qty: 12 | Fill #0 | Status: TO

## 2015-11-11 MED FILL — TEMAZEPAM 30 MG CAPSULE: 30 | 30 days supply | Qty: 60 | Fill #1

## 2015-11-12 DIAGNOSIS — H2513 Age-related nuclear cataract, bilateral: Secondary | ICD-10-CM | POA: Diagnosis not present

## 2015-11-12 DIAGNOSIS — D3132 Benign neoplasm of left choroid: Secondary | ICD-10-CM | POA: Diagnosis not present

## 2015-11-18 DIAGNOSIS — J029 Acute pharyngitis, unspecified: Secondary | ICD-10-CM | POA: Diagnosis not present

## 2015-11-18 DIAGNOSIS — Z6822 Body mass index (BMI) 22.0-22.9, adult: Secondary | ICD-10-CM | POA: Diagnosis not present

## 2015-11-18 DIAGNOSIS — R05 Cough: Secondary | ICD-10-CM | POA: Diagnosis not present

## 2015-11-18 DIAGNOSIS — J209 Acute bronchitis, unspecified: Secondary | ICD-10-CM | POA: Diagnosis not present

## 2015-11-18 MED FILL — CEFDINIR 300 MG CAPSULE: 300 | 7 days supply | Qty: 14 | Fill #0

## 2015-11-18 MED FILL — HYDROCODONE-HOMATROPINE SYR: 5-1.5 | 9 days supply | Qty: 180 | Fill #0

## 2015-11-20 ENCOUNTER — Other Ambulatory Visit: Payer: 59

## 2015-11-25 MED FILL — METOCLOPRAMIDE 5 MG TABLET: 5 | 90 days supply | Qty: 180 | Fill #1 | Status: TO

## 2015-11-27 MED FILL — MAGIC MOUTHWASH BOP FORM: 9 days supply | Qty: 180 | Fill #0

## 2015-11-29 ENCOUNTER — Telehealth: Payer: Self-pay | Admitting: Hematology and Oncology

## 2015-11-29 ENCOUNTER — Other Ambulatory Visit (HOSPITAL_BASED_OUTPATIENT_CLINIC_OR_DEPARTMENT_OTHER): Payer: 59

## 2015-11-29 ENCOUNTER — Encounter: Payer: Self-pay | Admitting: Hematology and Oncology

## 2015-11-29 ENCOUNTER — Ambulatory Visit (HOSPITAL_BASED_OUTPATIENT_CLINIC_OR_DEPARTMENT_OTHER): Payer: 59 | Admitting: Hematology and Oncology

## 2015-11-29 VITALS — BP 120/65 | HR 63 | Temp 97.7°F | Resp 18 | Ht 67.0 in | Wt 143.5 lb

## 2015-11-29 DIAGNOSIS — D696 Thrombocytopenia, unspecified: Secondary | ICD-10-CM

## 2015-11-29 DIAGNOSIS — C8587 Other specified types of non-Hodgkin lymphoma, spleen: Secondary | ICD-10-CM

## 2015-11-29 DIAGNOSIS — C8307 Small cell B-cell lymphoma, spleen: Secondary | ICD-10-CM

## 2015-11-29 DIAGNOSIS — R918 Other nonspecific abnormal finding of lung field: Secondary | ICD-10-CM | POA: Diagnosis not present

## 2015-11-29 LAB — COMPREHENSIVE METABOLIC PANEL
ALBUMIN: 3.9 g/dL (ref 3.5–5.0)
ALK PHOS: 132 U/L (ref 40–150)
ALT: 14 U/L (ref 0–55)
ANION GAP: 6 meq/L (ref 3–11)
AST: 20 U/L (ref 5–34)
BILIRUBIN TOTAL: 0.5 mg/dL (ref 0.20–1.20)
BUN: 21.3 mg/dL (ref 7.0–26.0)
CALCIUM: 9.2 mg/dL (ref 8.4–10.4)
CO2: 26 mEq/L (ref 22–29)
Chloride: 99 mEq/L (ref 98–109)
Creatinine: 1.1 mg/dL (ref 0.6–1.1)
EGFR: 54 mL/min/{1.73_m2} — AB (ref >90)
GLUCOSE: 97 mg/dL (ref 70–140)
POTASSIUM: 4.7 meq/L (ref 3.5–5.1)
SODIUM: 131 meq/L — AB (ref 136–145)
TOTAL PROTEIN: 6.2 g/dL — AB (ref 6.4–8.3)

## 2015-11-29 LAB — CBC WITH DIFFERENTIAL/PLATELET
BASO%: 0.3 % (ref 0.0–2.0)
BASOS ABS: 0 10*3/uL (ref 0.0–0.1)
EOS ABS: 0.1 10*3/uL (ref 0.0–0.5)
EOS%: 1.3 % (ref 0.0–7.0)
HCT: 38.4 % (ref 34.8–46.6)
HEMOGLOBIN: 13.3 g/dL (ref 11.6–15.9)
LYMPH%: 31.8 % (ref 14.0–49.7)
MCH: 30.2 pg (ref 25.1–34.0)
MCHC: 34.6 g/dL (ref 31.5–36.0)
MCV: 87.3 fL (ref 79.5–101.0)
MONO#: 0.6 10*3/uL (ref 0.1–0.9)
MONO%: 8.6 % (ref 0.0–14.0)
NEUT#: 4 10*3/uL (ref 1.5–6.5)
NEUT%: 58 % (ref 38.4–76.8)
PLATELETS: 135 10*3/uL — AB (ref 145–400)
RBC: 4.4 10*6/uL (ref 3.70–5.45)
RDW: 12 % (ref 11.2–14.5)
WBC: 6.9 10*3/uL (ref 3.9–10.3)
lymph#: 2.2 10*3/uL (ref 0.9–3.3)

## 2015-11-29 NOTE — Assessment & Plan Note (Signed)
She was treated for atypical fungal infection by pulmonologist She has no further symptoms We will follow with routine imaging study next year in May 2018

## 2015-11-29 NOTE — Progress Notes (Signed)
[facility] OFFICE PROGRESS NOTE  Patient Care Team: Velna Hatchet, MD as PCP - General (Internal Medicine) Fleet Contras, MD as Consulting Physician (Nephrology)  SUMMARY OF ONCOLOGIC HISTORY: Oncology History   Non-Hodgkin lymphoma, marginal zone lymphoma   Primary site: Lymphoid Neoplasms   Staging method: AJCC 6th Edition   Clinical: Stage IV signed by Heath Lark, MD on 05/30/2013  8:47 AM   Summary: Stage IV     Splenic marginal zone b-cell lymphoma ([place])   02/23/2013 Imaging    CT scan show significant enlargement of liver and spleen      05/05/2013 Bone Marrow Biopsy    Bone marrow aspirate and biopsy confirmed low grade lymphoma, suspect marginal zone lymphoma      05/08/2013 Imaging    PET scan show involvement of liver and spleen      06/02/2013 - 06/23/2013 Chemotherapy    She was started on weekly rituximab.      09/04/2013 Imaging    PET CT scan show complete response to treatment with splenic size reduced back to normal limits. She has persistent mild thrombocytopenia.      12/06/2013 Imaging    PET scan was negative and spleen size is normal      06/06/2014 Imaging    PEt scan is negative      06/05/2015 Imaging    No findings to suggest residual/recurrent splenic lymphoma       INTERVAL HISTORY: Please see below for problem oriented charting. She feels well. She was recently treated for atypical fungal infection She had no new symptoms of cough or fever Appetite is stable Denies recent infection No new lymphadenopathy The patient denies any recent signs or symptoms of bleeding such as spontaneous epistaxis, hematuria or hematochezia.   REVIEW OF SYSTEMS:   Constitutional: Denies fevers, chills or abnormal weight loss Eyes: Denies blurriness of vision Ears, nose, mouth, throat, and face: Denies mucositis or sore throat Respiratory: Denies cough, dyspnea or wheezes Cardiovascular: Denies palpitation, chest discomfort or lower  extremity swelling Gastrointestinal:  Denies nausea, heartburn or change in bowel habits Skin: Denies abnormal skin rashes Lymphatics: Denies new lymphadenopathy or easy bruising Neurological:Denies numbness, tingling or new weaknesses Behavioral/Psych: Mood is stable, no new changes  All other systems were reviewed with the patient and are negative.  I have reviewed the past medical history, past surgical history, social history and family history with the patient and they are unchanged from previous note.  ALLERGIES:  is allergic to biaxin [clarithromycin]; erythromycin; soy allergy; zithromax [azithromycin]; and zofran [ondansetron hcl].  MEDICATIONS:  Current Outpatient Prescriptions  Medication Sig Dispense Refill  . cholecalciferol (VITAMIN D) 1000 units tablet Take 1,000 Units by mouth daily. Reported on 07/01/2015    . estradiol (ESTRACE) 2 MG tablet Take 2 mg by mouth every evening. Stop taking for 12 days every 90 days while taking progesterone    . Fe Fum-FePoly-Vit C-Vit B3 (INTEGRA) 62.5-62.5-40-3 MG CAPS Take 1 capsule by mouth daily. 30 capsule 1  . folic acid (FOLVITE) 1 MG tablet TAKE 1 TABLET BY MOUTH DAILY. 30 tablet PRN  . HYDROcodone-homatropine (HYCODAN) 5-1.5 MG/5ML syrup Take 5 mLs by mouth every 6 (six) hours as needed. 120 mL 0  . Linaclotide (LINZESS) 290 MCG CAPS capsule Take 1 capsule (290 mcg total) by mouth every morning. 30 capsule 5  . metoCLOPramide (REGLAN) 5 MG tablet TAKE 1 TABLET BY MOUTH 2 (TWO) TIMES DAILY AT 10 AM AND 4 PM. 180 tablet 1  .  progesterone (PROMETRIUM) 100 MG capsule Take 100 mg by mouth as directed. Reported on 07/01/2015    . temazepam (RESTORIL) 30 MG capsule Take 2 capsules (60 mg total) by mouth at bedtime. 60 capsule 1  . traZODone (DESYREL) 50 MG tablet Take 50 mg by mouth at bedtime.     No current facility-administered medications for this visit.     PHYSICAL EXAMINATION: ECOG PERFORMANCE STATUS: 0 - Asymptomatic  Vitals:    11/29/15 1501  BP: 120/65  Pulse: 63  Resp: 18  Temp: 97.7 F (36.5 C)   Filed Weights   11/29/15 1501  Weight: 143 lb 8 oz (65.1 kg)    GENERAL:alert, no distress and comfortable SKIN: skin color, texture, turgor are normal, no rashes or significant lesions EYES: normal, Conjunctiva are pink and non-injected, sclera clear OROPHARYNX:no exudate, no erythema and lips, buccal mucosa, and tongue normal  NECK: supple, thyroid normal size, non-tender, without nodularity LYMPH:  no palpable lymphadenopathy in the cervical, axillary or inguinal LUNGS: clear to auscultation and percussion with normal breathing effort HEART: regular rate & rhythm and no murmurs and no lower extremity edema ABDOMEN:abdomen soft, non-tender and normal bowel sounds Musculoskeletal:no cyanosis of digits and no clubbing  NEURO: alert & oriented x 3 with fluent speech, no focal motor/sensory deficits  LABORATORY DATA:  I have reviewed the data as listed    Component Value Date/Time   NA 138 08/15/2015 1426   NA 141 06/05/2015 1201   K 3.8 08/15/2015 1426   K 4.0 06/05/2015 1201   CL 104 08/15/2015 1426   CO2 29 08/15/2015 1426   CO2 27 06/05/2015 1201   GLUCOSE 91 08/15/2015 1426   GLUCOSE 102 06/05/2015 1201   BUN 27 (H) 08/15/2015 1426   BUN 16.1 06/05/2015 1201   CREATININE 1.09 08/15/2015 1426   CREATININE 1.2 (H) 06/05/2015 1201   CALCIUM 9.2 08/15/2015 1426   CALCIUM 9.5 06/05/2015 1201   PROT 5.9 (L) 09/25/2015 1210   PROT 6.5 06/05/2015 1201   ALBUMIN 4.1 09/25/2015 1210   ALBUMIN 4.4 06/05/2015 1201   AST 15 09/25/2015 1210   AST 14 06/05/2015 1201   ALT 12 09/25/2015 1210   ALT 9 06/05/2015 1201   ALKPHOS 104 09/25/2015 1210   ALKPHOS 96 06/05/2015 1201   BILITOT 0.4 09/25/2015 1210   BILITOT 0.55 06/05/2015 1201   GFRNONAA 35 (L) 05/05/2013 0730   GFRAA 41 (L) 05/05/2013 0730    No results found for: SPEP, UPEP  Lab Results  Component Value Date   WBC 6.9 11/29/2015    NEUTROABS 4.0 11/29/2015   HGB 13.3 11/29/2015   HCT 38.4 11/29/2015   MCV 87.3 11/29/2015   PLT 135 (L) 11/29/2015      Chemistry      Component Value Date/Time   NA 138 08/15/2015 1426   NA 141 06/05/2015 1201   K 3.8 08/15/2015 1426   K 4.0 06/05/2015 1201   CL 104 08/15/2015 1426   CO2 29 08/15/2015 1426   CO2 27 06/05/2015 1201   BUN 27 (H) 08/15/2015 1426   BUN 16.1 06/05/2015 1201   CREATININE 1.09 08/15/2015 1426   CREATININE 1.2 (H) 06/05/2015 1201      Component Value Date/Time   CALCIUM 9.2 08/15/2015 1426   CALCIUM 9.5 06/05/2015 1201   ALKPHOS 104 09/25/2015 1210   ALKPHOS 96 06/05/2015 1201   AST 15 09/25/2015 1210   AST 14 06/05/2015 1201   ALT 12 09/25/2015 1210  ALT 9 06/05/2015 1201   BILITOT 0.4 09/25/2015 1210   BILITOT 0.55 06/05/2015 1201     ASSESSMENT & PLAN:  Splenic marginal zone b-cell lymphoma ([facility]) She has no symptoms of left upper quadrant pain. Repeat PET CT scan showed no evidence of disease. I recommend observation only. I plan to see her again in 6 months or repeat history, physical examination, blood work along with repeat imaging   Thrombocytopenia The cause is unknown, likely due to consumption. It is mild and there is little change compared from previous platelet count. The patient denies recent history of bleeding such as epistaxis, hematuria or hematochezia up from intermittent hemorrhoidal bleeding. She is asymptomatic from the thrombocytopenia. I will observe for now.    Multiple lung nodules on CT She was treated for atypical fungal infection by pulmonologist She has no further symptoms We will follow with routine imaging study next year in May 2018   Orders Placed This Encounter  Procedures  . CT CHEST W CONTRAST    Standing Status:   Future    Standing Expiration Date:   01/02/2017    Order Specific Question:   Reason for exam:    Answer:   staging lymphoma, exclude recurrence    Order Specific Question:    Preferred imaging location?    Answer:   [facility]  . CT ABDOMEN PELVIS W CONTRAST    Standing Status:   Future    Standing Expiration Date:   01/02/2017    Order Specific Question:   Reason for exam:    Answer:   staging lymphoma, exclude recurrence    Order Specific Question:   Preferred imaging location?    Answer:   [facility]  . CBC with Differential/Platelet    Standing Status:   Future    Standing Expiration Date:   01/02/2017  . Comprehensive metabolic panel    Standing Status:   Future    Standing Expiration Date:   01/02/2017  . Lactate dehydrogenase    Standing Status:   Future    Standing Expiration Date:   01/02/2017   All questions were answered. The patient knows to call the clinic with any problems, questions or concerns. No barriers to learning was detected. I spent 15 minutes counseling the patient face to face. The total time spent in the appointment was 20 minutes and more than 50% was on counseling and review of test results     Heath Lark, MD 11/29/2015 3:30 PM

## 2015-11-29 NOTE — Assessment & Plan Note (Signed)
She has no symptoms of left upper quadrant pain. Repeat PET CT scan showed no evidence of disease. I recommend observation only. I plan to see her again in 6 months or repeat history, physical examination, blood work along with repeat imaging

## 2015-11-29 NOTE — Telephone Encounter (Signed)
Appointments scheduled per 11/10 LOS. Patient given AVS report and calendars with future scheduled appointments.  Patient aware she will have a scan in May. Patient requested to pick up prep sooner to date of scan.

## 2015-11-29 NOTE — Assessment & Plan Note (Signed)
The cause is unknown, likely due to consumption. It is mild and there is little change compared from previous platelet count. The patient denies recent history of bleeding such as epistaxis, hematuria or hematochezia up from intermittent hemorrhoidal bleeding. She is asymptomatic from the thrombocytopenia. I will observe for now.   

## 2015-12-03 DIAGNOSIS — C8590 Non-Hodgkin lymphoma, unspecified, unspecified site: Secondary | ICD-10-CM | POA: Diagnosis not present

## 2015-12-03 DIAGNOSIS — N183 Chronic kidney disease, stage 3 (moderate): Secondary | ICD-10-CM | POA: Diagnosis not present

## 2015-12-03 DIAGNOSIS — N3010 Interstitial cystitis (chronic) without hematuria: Secondary | ICD-10-CM | POA: Diagnosis not present

## 2015-12-09 MED FILL — TEMAZEPAM 30 MG CAPSULE: 30 | 30 days supply | Qty: 60 | Fill #2

## 2015-12-17 ENCOUNTER — Other Ambulatory Visit: Payer: 59

## 2015-12-30 ENCOUNTER — Other Ambulatory Visit: Payer: 59

## 2015-12-31 ENCOUNTER — Ambulatory Visit
Admission: RE | Admit: 2015-12-31 | Discharge: 2015-12-31 | Disposition: A | Discharge status: Home / Self Care | Payer: 59 | Source: Ambulatory Visit | Attending: Obstetrics & Gynecology | Admitting: Obstetrics & Gynecology

## 2015-12-31 DIAGNOSIS — N6321 Unspecified lump in the left breast, upper outer quadrant: Secondary | ICD-10-CM | POA: Diagnosis not present

## 2015-12-31 DIAGNOSIS — N6320 Unspecified lump in the left breast, unspecified quadrant: Secondary | ICD-10-CM

## 2016-01-07 ENCOUNTER — Other Ambulatory Visit: Payer: Self-pay | Admitting: Dermatology

## 2016-01-07 DIAGNOSIS — D492 Neoplasm of unspecified behavior of bone, soft tissue, and skin: Secondary | ICD-10-CM | POA: Diagnosis not present

## 2016-01-07 DIAGNOSIS — B079 Viral wart, unspecified: Secondary | ICD-10-CM | POA: Diagnosis not present

## 2016-01-07 DIAGNOSIS — L309 Dermatitis, unspecified: Secondary | ICD-10-CM | POA: Diagnosis not present

## 2016-01-08 MED FILL — TEMAZEPAM 30 MG CAPSULE: 30 | 90 days supply | Qty: 180 | Fill #0

## 2016-01-08 MED FILL — ESTRADIOL 1 MG TABLET: 1 | 90 days supply | Qty: 90 | Fill #0 | Status: TO

## 2016-01-08 MED FILL — LINZESS 290 MCG CAPSULE: 290 | 90 days supply | Qty: 90 | Fill #1

## 2016-01-09 MED FILL — INTEGRA CAPSULE: 62.5-62.5-4 | 90 days supply | Qty: 90 | Fill #1

## 2016-05-25 ENCOUNTER — Other Ambulatory Visit: Payer: Self-pay | Admitting: Hematology and Oncology

## 2016-05-25 DIAGNOSIS — C8307 Small cell B-cell lymphoma, spleen: Secondary | ICD-10-CM

## 2016-05-27 ENCOUNTER — Encounter: Payer: Self-pay | Admitting: *Deleted

## 2016-05-28 ENCOUNTER — Other Ambulatory Visit: Payer: 59

## 2016-05-28 ENCOUNTER — Telehealth: Payer: Self-pay | Admitting: Hematology and Oncology

## 2016-05-28 NOTE — Telephone Encounter (Signed)
Called patient to inform her of 5/29 appointment. Patient aware.

## 2016-05-29 ENCOUNTER — Ambulatory Visit: Payer: 59 | Admitting: Hematology and Oncology

## 2016-06-10 DIAGNOSIS — I872 Venous insufficiency (chronic) (peripheral): Secondary | ICD-10-CM | POA: Diagnosis not present

## 2016-06-10 DIAGNOSIS — L308 Other specified dermatitis: Secondary | ICD-10-CM | POA: Diagnosis not present

## 2016-06-16 ENCOUNTER — Ambulatory Visit (HOSPITAL_COMMUNITY)
Admission: RE | Admit: 2016-06-16 | Discharge: 2016-06-16 | Disposition: A | Discharge status: Home / Self Care | Payer: PPO | Source: Ambulatory Visit | Attending: Hematology and Oncology | Admitting: Hematology and Oncology

## 2016-06-16 ENCOUNTER — Other Ambulatory Visit (HOSPITAL_BASED_OUTPATIENT_CLINIC_OR_DEPARTMENT_OTHER): Payer: PPO

## 2016-06-16 DIAGNOSIS — R918 Other nonspecific abnormal finding of lung field: Secondary | ICD-10-CM | POA: Insufficient documentation

## 2016-06-16 DIAGNOSIS — I313 Pericardial effusion (noninflammatory): Secondary | ICD-10-CM | POA: Diagnosis not present

## 2016-06-16 DIAGNOSIS — C8307 Small cell B-cell lymphoma, spleen: Secondary | ICD-10-CM | POA: Insufficient documentation

## 2016-06-16 DIAGNOSIS — C8587 Other specified types of non-Hodgkin lymphoma, spleen: Secondary | ICD-10-CM | POA: Diagnosis not present

## 2016-06-16 DIAGNOSIS — D696 Thrombocytopenia, unspecified: Secondary | ICD-10-CM

## 2016-06-16 DIAGNOSIS — C8593 Non-Hodgkin lymphoma, unspecified, intra-abdominal lymph nodes: Secondary | ICD-10-CM | POA: Diagnosis not present

## 2016-06-16 DIAGNOSIS — J90 Pleural effusion, not elsewhere classified: Secondary | ICD-10-CM | POA: Diagnosis not present

## 2016-06-16 LAB — COMPREHENSIVE METABOLIC PANEL
ALT: 13 U/L (ref 0–55)
ANION GAP: 10 meq/L (ref 3–11)
AST: 18 U/L (ref 5–34)
Albumin: 4.7 g/dL (ref 3.5–5.0)
Alkaline Phosphatase: 124 U/L (ref 40–150)
BUN: 23.8 mg/dL (ref 7.0–26.0)
CHLORIDE: 98 meq/L (ref 98–109)
CO2: 29 meq/L (ref 22–29)
Calcium: 10.3 mg/dL (ref 8.4–10.4)
Creatinine: 1.3 mg/dL — ABNORMAL HIGH (ref 0.6–1.1)
EGFR: 42 mL/min/{1.73_m2} — AB (ref >90)
Glucose: 96 mg/dl (ref 70–140)
POTASSIUM: 4.3 meq/L (ref 3.5–5.1)
Sodium: 137 mEq/L (ref 136–145)
Total Bilirubin: 0.45 mg/dL (ref 0.20–1.20)
Total Protein: 6.9 g/dL (ref 6.4–8.3)

## 2016-06-16 LAB — CBC WITH DIFFERENTIAL/PLATELET
BASO%: 0.5 % (ref 0.0–2.0)
Basophils Absolute: 0 10*3/uL (ref 0.0–0.1)
EOS ABS: 0.1 10*3/uL (ref 0.0–0.5)
EOS%: 0.9 % (ref 0.0–7.0)
HCT: 44.9 % (ref 34.8–46.6)
HGB: 15 g/dL (ref 11.6–15.9)
LYMPH%: 23.3 % (ref 14.0–49.7)
MCH: 30.3 pg (ref 25.1–34.0)
MCHC: 33.4 g/dL (ref 31.5–36.0)
MCV: 91 fL (ref 79.5–101.0)
MONO#: 0.5 10*3/uL (ref 0.1–0.9)
MONO%: 6.8 % (ref 0.0–14.0)
NEUT#: 5.1 10*3/uL (ref 1.5–6.5)
NEUT%: 68.5 % (ref 38.4–76.8)
PLATELETS: 141 10*3/uL — AB (ref 145–400)
RBC: 4.93 10*6/uL (ref 3.70–5.45)
RDW: 12.8 % (ref 11.2–14.5)
WBC: 7.4 10*3/uL (ref 3.9–10.3)
lymph#: 1.7 10*3/uL (ref 0.9–3.3)

## 2016-06-16 LAB — LACTATE DEHYDROGENASE: LDH: 194 U/L (ref 125–245)

## 2016-06-16 MED ORDER — IOPAMIDOL (ISOVUE-300) INJECTION 61%
100.0000 mL | Freq: Once | INTRAVENOUS | Status: AC | PRN
  Administered 2016-06-16: 75 mL via INTRAVENOUS

## 2016-06-16 MED ORDER — IOPAMIDOL (ISOVUE-300) INJECTION 61%
INTRAVENOUS | Status: AC
  Filled 2016-06-16: qty 100

## 2016-06-18 ENCOUNTER — Encounter: Payer: Self-pay | Admitting: Hematology and Oncology

## 2016-06-18 ENCOUNTER — Ambulatory Visit (HOSPITAL_BASED_OUTPATIENT_CLINIC_OR_DEPARTMENT_OTHER): Payer: PPO | Admitting: Hematology and Oncology

## 2016-06-18 VITALS — BP 119/73 | HR 73 | Temp 97.8°F | Resp 20 | Ht 67.0 in | Wt 134.5 lb

## 2016-06-18 DIAGNOSIS — N183 Chronic kidney disease, stage 3 unspecified: Secondary | ICD-10-CM

## 2016-06-18 DIAGNOSIS — D696 Thrombocytopenia, unspecified: Secondary | ICD-10-CM | POA: Diagnosis not present

## 2016-06-18 DIAGNOSIS — R918 Other nonspecific abnormal finding of lung field: Secondary | ICD-10-CM

## 2016-06-18 DIAGNOSIS — Z8572 Personal history of non-Hodgkin lymphomas: Secondary | ICD-10-CM

## 2016-06-18 DIAGNOSIS — C8307 Small cell B-cell lymphoma, spleen: Secondary | ICD-10-CM

## 2016-06-18 NOTE — Assessment & Plan Note (Signed)
She has mild, intermittent elevated creatinine. She feels well and will continue to follow with nephrologist for medical management. I recommend increase oral fluid hydration as tolerated.

## 2016-06-18 NOTE — Assessment & Plan Note (Signed)
The cause is unknown, likely due to consumption. It is mild and there is little change compared from previous platelet count. The patient denies recent history of bleeding such as epistaxis, hematuria or hematochezia up from intermittent hemorrhoidal bleeding. She is asymptomatic from the thrombocytopenia. I will observe for now.

## 2016-06-18 NOTE — Assessment & Plan Note (Signed)
She was treated for atypical fungal infection by pulmonologist She has no further symptoms Repeat imaging study showed no evidence of disease

## 2016-06-18 NOTE — Assessment & Plan Note (Signed)
She has no symptoms of left upper quadrant pain. Repeat CT scan showed no evidence of disease. I recommend observation only. I plan to see her again in 6 months for repeat history, physical examination and blood work only without repeat imaging

## 2016-06-18 NOTE — Progress Notes (Signed)
[place] OFFICE PROGRESS NOTE  Patient Care Team: Velna Hatchet, MD as PCP - General (Internal Medicine) Fleet Contras, MD as Consulting Physician (Nephrology)  SUMMARY OF ONCOLOGIC HISTORY: Oncology History   Non-Hodgkin lymphoma, marginal zone lymphoma   Primary site: Lymphoid Neoplasms   Staging method: AJCC 6th Edition   Clinical: Stage IV signed by Heath Lark, MD on 05/30/2013  8:47 AM   Summary: Stage IV     Splenic marginal zone b-cell lymphoma ([place])   02/23/2013 Imaging    CT scan show significant enlargement of liver and spleen      05/05/2013 Bone Marrow Biopsy    Bone marrow aspirate and biopsy confirmed low grade lymphoma, suspect marginal zone lymphoma      05/08/2013 Imaging    PET scan show involvement of liver and spleen      06/02/2013 - 06/23/2013 Chemotherapy    She was started on weekly rituximab.      09/04/2013 Imaging    PET CT scan show complete response to treatment with splenic size reduced back to normal limits. She has persistent mild thrombocytopenia.      12/06/2013 Imaging    PET scan was negative and spleen size is normal      06/06/2014 Imaging    PEt scan is negative      06/05/2015 Imaging    No findings to suggest residual/recurrent splenic lymphoma      06/16/2016 Imaging    1. No enlarged lymph nodes or mass within the chest abdomen or pelvis to suggest residual/recurrent lymphoma. 2. Small pericardial effusion. Unchanged 3. Scar like densities noted within the right middle lobe status post pneumonia.       INTERVAL HISTORY: Please see below for problem oriented charting. She is seen with her husband to review test results She feels well No recent infection Denies recent chest pain, shortness of breath, fever or chills. She shared with me she has recently retired She is taking care of her mother who is currently residing in a skilled nursing home.  There are numerous challenges related to that Her  husband is doing well on insulin pump The patient denies any recent signs or symptoms of bleeding such as spontaneous epistaxis, hematuria or hematochezia.  REVIEW OF SYSTEMS:   Constitutional: Denies fevers, chills or abnormal weight loss Eyes: Denies blurriness of vision Ears, nose, mouth, throat, and face: Denies mucositis or sore throat Respiratory: Denies cough, dyspnea or wheezes Cardiovascular: Denies palpitation, chest discomfort or lower extremity swelling Gastrointestinal:  Denies nausea, heartburn or change in bowel habits Skin: Denies abnormal skin rashes Lymphatics: Denies new lymphadenopathy or easy bruising Neurological:Denies numbness, tingling or new weaknesses Behavioral/Psych: Mood is stable, no new changes  All other systems were reviewed with the patient and are negative.  I have reviewed the past medical history, past surgical history, social history and family history with the patient and they are unchanged from previous note.  ALLERGIES:  is allergic to biaxin [clarithromycin]; erythromycin; soy allergy; zithromax [azithromycin]; and zofran [ondansetron hcl].  MEDICATIONS:  Current Outpatient Prescriptions  Medication Sig Dispense Refill  . Biotin 5 MG TABS Take by mouth.    . cholecalciferol (VITAMIN D) 1000 units tablet Take 1,000 Units by mouth daily. Reported on 07/01/2015    . estradiol (ESTRACE) 2 MG tablet Take 2 mg by mouth every evening. Stop taking for 12 days every 90 days while taking progesterone    . Lactobacillus (ACIDOPHILUS) 100 MG CAPS Take by mouth.    Marland Kitchen  Linaclotide (LINZESS) 290 MCG CAPS capsule Take 1 capsule (290 mcg total) by mouth every morning. 30 capsule 5  . metoCLOPramide (REGLAN) 5 MG tablet TAKE 1 TABLET BY MOUTH 2 (TWO) TIMES DAILY AT 10 AM AND 4 PM. 180 tablet 1  . progesterone (PROMETRIUM) 100 MG capsule Take 100 mg by mouth as directed. Reported on 07/01/2015    . temazepam (RESTORIL) 30 MG capsule Take 2 capsules (60 mg total) by  mouth at bedtime. 60 capsule 1  . traZODone (DESYREL) 50 MG tablet Take 50 mg by mouth at bedtime.     No current facility-administered medications for this visit.     PHYSICAL EXAMINATION: ECOG PERFORMANCE STATUS: 0 - Asymptomatic  Vitals:   06/18/16 1314  BP: 119/73  Pulse: 73  Resp: 20  Temp: 97.8 F (36.6 C)   Filed Weights   06/18/16 1314  Weight: 134 lb 8 oz (61 kg)    GENERAL:alert, no distress and comfortable SKIN: skin color, texture, turgor are normal, no rashes or significant lesions EYES: normal, Conjunctiva are pink and non-injected, sclera clear OROPHARYNX:no exudate, no erythema and lips, buccal mucosa, and tongue normal  NECK: supple, thyroid normal size, non-tender, without nodularity LYMPH:  no palpable lymphadenopathy in the cervical, axillary or inguinal LUNGS: clear to auscultation and percussion with normal breathing effort HEART: regular rate & rhythm and no murmurs and no lower extremity edema ABDOMEN:abdomen soft, non-tender and normal bowel sounds Musculoskeletal:no cyanosis of digits and no clubbing  NEURO: alert & oriented x 3 with fluent speech, no focal motor/sensory deficits  LABORATORY DATA:  I have reviewed the data as listed    Component Value Date/Time   NA 137 06/16/2016 1233   K 4.3 06/16/2016 1233   CL 104 08/15/2015 1426   CO2 29 06/16/2016 1233   GLUCOSE 96 06/16/2016 1233   BUN 23.8 06/16/2016 1233   CREATININE 1.3 (H) 06/16/2016 1233   CALCIUM 10.3 06/16/2016 1233   PROT 6.9 06/16/2016 1233   ALBUMIN 4.7 06/16/2016 1233   AST 18 06/16/2016 1233   ALT 13 06/16/2016 1233   ALKPHOS 124 06/16/2016 1233   BILITOT 0.45 06/16/2016 1233   GFRNONAA 35 (L) 05/05/2013 0730   GFRAA 41 (L) 05/05/2013 0730    No results found for: SPEP, UPEP  Lab Results  Component Value Date   WBC 7.4 06/16/2016   NEUTROABS 5.1 06/16/2016   HGB 15.0 06/16/2016   HCT 44.9 06/16/2016   MCV 91.0 06/16/2016   PLT 141 (L) 06/16/2016       Chemistry      Component Value Date/Time   NA 137 06/16/2016 1233   K 4.3 06/16/2016 1233   CL 104 08/15/2015 1426   CO2 29 06/16/2016 1233   BUN 23.8 06/16/2016 1233   CREATININE 1.3 (H) 06/16/2016 1233      Component Value Date/Time   CALCIUM 10.3 06/16/2016 1233   ALKPHOS 124 06/16/2016 1233   AST 18 06/16/2016 1233   ALT 13 06/16/2016 1233   BILITOT 0.45 06/16/2016 1233       RADIOGRAPHIC STUDIES: I reviewed the imaging study with her and her husband I have personally reviewed the radiological images as listed and agreed with the findings in the report. Ct Chest W Contrast  Result Date: 06/16/2016 CLINICAL DATA:  Non-Hodgkin's lymphoma. EXAM: CT CHEST, ABDOMEN, AND PELVIS WITH CONTRAST TECHNIQUE: Multidetector CT imaging of the chest, abdomen and pelvis was performed following the standard protocol during bolus administration of intravenous contrast.  CONTRAST:  4m ISOVUE-300 IOPAMIDOL (ISOVUE-300) INJECTION 61% COMPARISON:  PET-CT from 06/05/2015. FINDINGS: CT CHEST FINDINGS Cardiovascular: The heart size appears normal. Small pericardial effusion is again noted. Mediastinum/Nodes: No enlarged mediastinal, hilar, or axillary lymph nodes. Thyroid gland, trachea, and esophagus demonstrate no significant findings. Lungs/Pleura: Mild scar like densities identified in the right middle lobe. No airspace consolidation. No suspicious pulmonary nodules or masses. Musculoskeletal: No chest wall mass or suspicious bone lesions identified. CT ABDOMEN PELVIS FINDINGS Hepatobiliary: Multiple liver cysts are identified. No suspicious liver abnormality noted. The gallbladder is normal. No significant biliary dilatation. Pancreas: Unremarkable. No pancreatic ductal dilatation or surrounding inflammatory changes. Spleen: Normal in size without focal abnormality. Adrenals/Urinary Tract: The adrenal glands are normal. No kidney mass or hydronephrosis. The urinary bladder appears normal. Stomach/Bowel: The  stomach and the small bowel loops have a normal course and caliber. No bowel obstruction. Vascular/Lymphatic: No significant vascular findings are present. No enlarged abdominal or pelvic lymph nodes. Reproductive: Uterus and bilateral adnexa are unremarkable. Other: No abdominal wall hernia or abnormality. No abdominopelvic ascites. Musculoskeletal: No acute or significant osseous findings. IMPRESSION: 1. No enlarged lymph nodes or mass within the chest abdomen or pelvis to suggest residual/recurrent lymphoma. 2. Small pericardial effusion.  Unchanged 3. Scar like densities noted within the right middle lobe status post pneumonia. Electronically Signed   By: TKerby MoorsM.D.   On: 06/16/2016 15:50   Ct Abdomen Pelvis W Contrast  Result Date: 06/16/2016 CLINICAL DATA:  Non-Hodgkin's lymphoma. EXAM: CT CHEST, ABDOMEN, AND PELVIS WITH CONTRAST TECHNIQUE: Multidetector CT imaging of the chest, abdomen and pelvis was performed following the standard protocol during bolus administration of intravenous contrast. CONTRAST:  770mISOVUE-300 IOPAMIDOL (ISOVUE-300) INJECTION 61% COMPARISON:  PET-CT from 06/05/2015. FINDINGS: CT CHEST FINDINGS Cardiovascular: The heart size appears normal. Small pericardial effusion is again noted. Mediastinum/Nodes: No enlarged mediastinal, hilar, or axillary lymph nodes. Thyroid gland, trachea, and esophagus demonstrate no significant findings. Lungs/Pleura: Mild scar like densities identified in the right middle lobe. No airspace consolidation. No suspicious pulmonary nodules or masses. Musculoskeletal: No chest wall mass or suspicious bone lesions identified. CT ABDOMEN PELVIS FINDINGS Hepatobiliary: Multiple liver cysts are identified. No suspicious liver abnormality noted. The gallbladder is normal. No significant biliary dilatation. Pancreas: Unremarkable. No pancreatic ductal dilatation or surrounding inflammatory changes. Spleen: Normal in size without focal abnormality.  Adrenals/Urinary Tract: The adrenal glands are normal. No kidney mass or hydronephrosis. The urinary bladder appears normal. Stomach/Bowel: The stomach and the small bowel loops have a normal course and caliber. No bowel obstruction. Vascular/Lymphatic: No significant vascular findings are present. No enlarged abdominal or pelvic lymph nodes. Reproductive: Uterus and bilateral adnexa are unremarkable. Other: No abdominal wall hernia or abnormality. No abdominopelvic ascites. Musculoskeletal: No acute or significant osseous findings. IMPRESSION: 1. No enlarged lymph nodes or mass within the chest abdomen or pelvis to suggest residual/recurrent lymphoma. 2. Small pericardial effusion.  Unchanged 3. Scar like densities noted within the right middle lobe status post pneumonia. Electronically Signed   By: TaKerby Moors.D.   On: 06/16/2016 15:50    ASSESSMENT & PLAN:  Splenic marginal zone b-cell lymphoma ([facility]) She has no symptoms of left upper quadrant pain. Repeat CT scan showed no evidence of disease. I recommend observation only. I plan to see her again in 6 months for repeat history, physical examination and blood work only without repeat imaging   Thrombocytopenia The cause is unknown, likely due to consumption. It is mild and there is  little change compared from previous platelet count. The patient denies recent history of bleeding such as epistaxis, hematuria or hematochezia up from intermittent hemorrhoidal bleeding. She is asymptomatic from the thrombocytopenia. I will observe for now.    Multiple lung nodules on CT She was treated for atypical fungal infection by pulmonologist She has no further symptoms Repeat imaging study showed no evidence of disease  Chronic kidney disease, stage III (moderate) She has mild, intermittent elevated creatinine. She feels well and will continue to follow with nephrologist for medical management. I recommend increase oral fluid hydration as  tolerated.   Orders Placed This Encounter  Procedures  . Comprehensive metabolic panel    Standing Status:   Future    Standing Expiration Date:   07/23/2017  . CBC with Differential/Platelet    Standing Status:   Future    Standing Expiration Date:   07/23/2017  . Lactate dehydrogenase    Standing Status:   Future    Standing Expiration Date:   07/23/2017   All questions were answered. The patient knows to call the clinic with any problems, questions or concerns. No barriers to learning was detected. I spent 15 minutes counseling the patient face to face. The total time spent in the appointment was 20 minutes and more than 50% was on counseling and review of test results     Heath Lark, MD 06/18/2016 2:21 PM

## 2016-06-23 DIAGNOSIS — H2513 Age-related nuclear cataract, bilateral: Secondary | ICD-10-CM | POA: Diagnosis not present

## 2016-06-23 DIAGNOSIS — D3132 Benign neoplasm of left choroid: Secondary | ICD-10-CM | POA: Diagnosis not present

## 2016-08-31 ENCOUNTER — Other Ambulatory Visit: Payer: Self-pay | Admitting: Obstetrics & Gynecology

## 2016-08-31 DIAGNOSIS — N390 Urinary tract infection, site not specified: Secondary | ICD-10-CM | POA: Diagnosis not present

## 2016-08-31 DIAGNOSIS — N183 Chronic kidney disease, stage 3 (moderate): Secondary | ICD-10-CM | POA: Diagnosis not present

## 2016-08-31 DIAGNOSIS — N6320 Unspecified lump in the left breast, unspecified quadrant: Secondary | ICD-10-CM

## 2016-08-31 DIAGNOSIS — R8299 Other abnormal findings in urine: Secondary | ICD-10-CM | POA: Diagnosis not present

## 2016-08-31 DIAGNOSIS — M859 Disorder of bone density and structure, unspecified: Secondary | ICD-10-CM | POA: Diagnosis not present

## 2016-08-31 DIAGNOSIS — R946 Abnormal results of thyroid function studies: Secondary | ICD-10-CM | POA: Diagnosis not present

## 2016-08-31 DIAGNOSIS — Z79899 Other long term (current) drug therapy: Secondary | ICD-10-CM | POA: Diagnosis not present

## 2016-09-07 DIAGNOSIS — M859 Disorder of bone density and structure, unspecified: Secondary | ICD-10-CM | POA: Diagnosis not present

## 2016-09-07 DIAGNOSIS — N3010 Interstitial cystitis (chronic) without hematuria: Secondary | ICD-10-CM | POA: Diagnosis not present

## 2016-09-07 DIAGNOSIS — F418 Other specified anxiety disorders: Secondary | ICD-10-CM | POA: Diagnosis not present

## 2016-09-07 DIAGNOSIS — G4700 Insomnia, unspecified: Secondary | ICD-10-CM | POA: Diagnosis not present

## 2016-09-07 DIAGNOSIS — R918 Other nonspecific abnormal finding of lung field: Secondary | ICD-10-CM | POA: Diagnosis not present

## 2016-09-07 DIAGNOSIS — K222 Esophageal obstruction: Secondary | ICD-10-CM | POA: Diagnosis not present

## 2016-09-07 DIAGNOSIS — C8580 Other specified types of non-Hodgkin lymphoma, unspecified site: Secondary | ICD-10-CM | POA: Diagnosis not present

## 2016-09-07 DIAGNOSIS — Z0000 Encounter for general adult medical examination without abnormal findings: Secondary | ICD-10-CM | POA: Diagnosis not present

## 2016-09-07 DIAGNOSIS — Z6820 Body mass index (BMI) 20.0-20.9, adult: Secondary | ICD-10-CM | POA: Diagnosis not present

## 2016-09-07 DIAGNOSIS — K589 Irritable bowel syndrome without diarrhea: Secondary | ICD-10-CM | POA: Diagnosis not present

## 2016-09-07 DIAGNOSIS — Z1389 Encounter for screening for other disorder: Secondary | ICD-10-CM | POA: Diagnosis not present

## 2016-09-09 ENCOUNTER — Ambulatory Visit
Admission: RE | Admit: 2016-09-09 | Discharge: 2016-09-09 | Disposition: A | Discharge status: Home / Self Care | Payer: PPO | Source: Ambulatory Visit | Attending: Obstetrics & Gynecology | Admitting: Obstetrics & Gynecology

## 2016-09-09 DIAGNOSIS — N6320 Unspecified lump in the left breast, unspecified quadrant: Secondary | ICD-10-CM

## 2016-09-09 DIAGNOSIS — R922 Inconclusive mammogram: Secondary | ICD-10-CM | POA: Diagnosis not present

## 2016-09-09 DIAGNOSIS — N6489 Other specified disorders of breast: Secondary | ICD-10-CM | POA: Diagnosis not present

## 2016-11-04 DIAGNOSIS — Z6821 Body mass index (BMI) 21.0-21.9, adult: Secondary | ICD-10-CM | POA: Diagnosis not present

## 2016-11-04 DIAGNOSIS — Z01419 Encounter for gynecological examination (general) (routine) without abnormal findings: Secondary | ICD-10-CM | POA: Diagnosis not present

## 2016-11-27 DIAGNOSIS — L218 Other seborrheic dermatitis: Secondary | ICD-10-CM | POA: Diagnosis not present

## 2016-11-27 DIAGNOSIS — L710 Perioral dermatitis: Secondary | ICD-10-CM | POA: Diagnosis not present

## 2016-12-03 ENCOUNTER — Ambulatory Visit (HOSPITAL_BASED_OUTPATIENT_CLINIC_OR_DEPARTMENT_OTHER): Payer: PPO

## 2016-12-03 ENCOUNTER — Telehealth: Payer: Self-pay | Admitting: Hematology and Oncology

## 2016-12-03 ENCOUNTER — Ambulatory Visit: Payer: PPO | Admitting: Hematology and Oncology

## 2016-12-03 VITALS — BP 114/71 | HR 56 | Temp 97.5°F | Resp 18 | Ht 67.0 in | Wt 135.2 lb

## 2016-12-03 DIAGNOSIS — E871 Hypo-osmolality and hyponatremia: Secondary | ICD-10-CM | POA: Diagnosis not present

## 2016-12-03 DIAGNOSIS — Z8572 Personal history of non-Hodgkin lymphomas: Secondary | ICD-10-CM

## 2016-12-03 DIAGNOSIS — N183 Chronic kidney disease, stage 3 unspecified: Secondary | ICD-10-CM

## 2016-12-03 DIAGNOSIS — D696 Thrombocytopenia, unspecified: Secondary | ICD-10-CM | POA: Diagnosis not present

## 2016-12-03 DIAGNOSIS — C8307 Small cell B-cell lymphoma, spleen: Secondary | ICD-10-CM

## 2016-12-03 LAB — CBC WITH DIFFERENTIAL/PLATELET
BASO%: 0.9 % (ref 0.0–2.0)
Basophils Absolute: 0.1 10*3/uL (ref 0.0–0.1)
EOS%: 1 % (ref 0.0–7.0)
Eosinophils Absolute: 0.1 10*3/uL (ref 0.0–0.5)
HEMATOCRIT: 41.3 % (ref 34.8–46.6)
HGB: 13.7 g/dL (ref 11.6–15.9)
LYMPH#: 1.8 10*3/uL (ref 0.9–3.3)
LYMPH%: 32.7 % (ref 14.0–49.7)
MCH: 29.5 pg (ref 25.1–34.0)
MCHC: 33.1 g/dL (ref 31.5–36.0)
MCV: 89.3 fL (ref 79.5–101.0)
MONO#: 0.4 10*3/uL (ref 0.1–0.9)
MONO%: 8 % (ref 0.0–14.0)
NEUT%: 57.4 % (ref 38.4–76.8)
NEUTROS ABS: 3.1 10*3/uL (ref 1.5–6.5)
PLATELETS: 118 10*3/uL — AB (ref 145–400)
RBC: 4.63 10*6/uL (ref 3.70–5.45)
RDW: 11.9 % (ref 11.2–14.5)
WBC: 5.5 10*3/uL (ref 3.9–10.3)

## 2016-12-03 LAB — COMPREHENSIVE METABOLIC PANEL
ALT: 12 U/L (ref 0–55)
AST: 17 U/L (ref 5–34)
Albumin: 4.3 g/dL (ref 3.5–5.0)
Alkaline Phosphatase: 126 U/L (ref 40–150)
Anion Gap: 7 mEq/L (ref 3–11)
BUN: 11.4 mg/dL (ref 7.0–26.0)
CALCIUM: 9.4 mg/dL (ref 8.4–10.4)
CHLORIDE: 94 meq/L — AB (ref 98–109)
CO2: 27 meq/L (ref 22–29)
Creatinine: 1.1 mg/dL (ref 0.6–1.1)
EGFR: 51 mL/min/{1.73_m2} — AB (ref >60)
GLUCOSE: 96 mg/dL (ref 70–140)
Potassium: 4.2 mEq/L (ref 3.5–5.1)
Sodium: 128 mEq/L — ABNORMAL LOW (ref 136–145)
TOTAL PROTEIN: 6.3 g/dL — AB (ref 6.4–8.3)
Total Bilirubin: 0.5 mg/dL (ref 0.20–1.20)

## 2016-12-03 LAB — LACTATE DEHYDROGENASE: LDH: 173 U/L (ref 125–245)

## 2016-12-03 NOTE — Telephone Encounter (Signed)
Patient came in suppose to have an appointment with Dr. Alvy Bimler today however she was not scheduled. Dr Alvy Bimler said that it was ok to put her on the schedule.

## 2016-12-04 ENCOUNTER — Encounter: Payer: Self-pay | Admitting: Hematology and Oncology

## 2016-12-04 NOTE — Assessment & Plan Note (Signed)
Clinically, she have no signs of cancer recurrence Given high risk of cancer recurrence, plan to order imaging study and blood work next year

## 2016-12-04 NOTE — Assessment & Plan Note (Addendum)
The cause is unknown, likely due to consumption or prior treatment. It is mild and there is little change compared from previous platelet count. The patient denies recent history of bleeding such as epistaxis, hematuria or hematochezia up from intermittent hemorrhoidal bleeding. She is asymptomatic from the thrombocytopenia. I will observe for now.

## 2016-12-04 NOTE — Assessment & Plan Note (Signed)
She has mild, intermittent elevated creatinine. She feels well and will continue to follow with nephrologist for medical management. 

## 2016-12-04 NOTE — Assessment & Plan Note (Signed)
She has intermittent hyponatremia of unknown etiology It could be related to her low salt intake Observe only

## 2016-12-04 NOTE — Progress Notes (Signed)
[place] OFFICE PROGRESS NOTE  Patient Care Team: Velna Hatchet, MD as PCP - General (Internal Medicine) Fleet Contras, MD as Consulting Physician (Nephrology)  SUMMARY OF ONCOLOGIC HISTORY: Oncology History   Non-Hodgkin lymphoma, marginal zone lymphoma   Primary site: Lymphoid Neoplasms   Staging method: AJCC 6th Edition   Clinical: Stage IV signed by Heath Lark, MD on 05/30/2013  8:47 AM   Summary: Stage IV     Splenic marginal zone b-cell lymphoma ([place])   02/23/2013 Imaging    CT scan show significant enlargement of liver and spleen      05/05/2013 Bone Marrow Biopsy    Bone marrow aspirate and biopsy confirmed low grade lymphoma, suspect marginal zone lymphoma      05/08/2013 Imaging    PET scan show involvement of liver and spleen      06/02/2013 - 06/23/2013 Chemotherapy    She was started on weekly rituximab.      09/04/2013 Imaging    PET CT scan show complete response to treatment with splenic size reduced back to normal limits. She has persistent mild thrombocytopenia.      12/06/2013 Imaging    PET scan was negative and spleen size is normal      06/06/2014 Imaging    PEt scan is negative      06/05/2015 Imaging    No findings to suggest residual/recurrent splenic lymphoma      06/16/2016 Imaging    1. No enlarged lymph nodes or mass within the chest abdomen or pelvis to suggest residual/recurrent lymphoma. 2. Small pericardial effusion. Unchanged 3. Scar like densities noted within the right middle lobe status post pneumonia.       INTERVAL HISTORY: Please see below for problem oriented charting. She returns with her husband for further follow-up She denies recent infection No new lymphadenopathy No new left upper quadrant pain The patient denies any recent signs or symptoms of bleeding such as spontaneous epistaxis, hematuria or hematochezia. She bruises easily REVIEW OF SYSTEMS:   Constitutional: Denies fevers, chills or  abnormal weight loss Eyes: Denies blurriness of vision Ears, nose, mouth, throat, and face: Denies mucositis or sore throat Respiratory: Denies cough, dyspnea or wheezes Cardiovascular: Denies palpitation, chest discomfort or lower extremity swelling Gastrointestinal:  Denies nausea, heartburn or change in bowel habits Skin: Denies abnormal skin rashes Lymphatics: Denies new lymphadenopathy  Neurological:Denies numbness, tingling or new weaknesses Behavioral/Psych: Mood is stable, no new changes  All other systems were reviewed with the patient and are negative.  I have reviewed the past medical history, past surgical history, social history and family history with the patient and they are unchanged from previous note.  ALLERGIES:  is allergic to biaxin [clarithromycin]; erythromycin; soy allergy; zithromax [azithromycin]; and zofran [ondansetron hcl].  MEDICATIONS:  Current Outpatient Medications  Medication Sig Dispense Refill  . zinc sulfate 220 (50 Zn) MG capsule Take 220 mg daily by mouth.    . Biotin 5 MG TABS Take by mouth.    . cholecalciferol (VITAMIN D) 1000 units tablet Take 1,000 Units by mouth daily. Reported on 07/01/2015    . Lactobacillus (ACIDOPHILUS) 100 MG CAPS Take by mouth.    . Linaclotide (LINZESS) 290 MCG CAPS capsule Take 1 capsule (290 mcg total) by mouth every morning. 30 capsule 5  . metoCLOPramide (REGLAN) 5 MG tablet TAKE 1 TABLET BY MOUTH 2 (TWO) TIMES DAILY AT 10 AM AND 4 PM. 180 tablet 1  . progesterone (PROMETRIUM) 100 MG capsule Take  100 mg by mouth as directed. Reported on 07/01/2015    . temazepam (RESTORIL) 30 MG capsule Take 2 capsules (60 mg total) by mouth at bedtime. 60 capsule 1  . traZODone (DESYREL) 50 MG tablet Take 50 mg by mouth at bedtime.     No current facility-administered medications for this visit.     PHYSICAL EXAMINATION: ECOG PERFORMANCE STATUS: 0 - Asymptomatic  Vitals:   12/03/16 1503  BP: 114/71  Pulse: (!) 56  Resp: 18   Temp: (!) 97.5 F (36.4 C)  SpO2: 100%   Filed Weights   12/03/16 1503  Weight: 135 lb 3.2 oz (61.3 kg)    GENERAL:alert, no distress and comfortable SKIN: skin color, texture, turgor are normal, no rashes or significant lesions. Noted skin bruising EYES: normal, Conjunctiva are pink and non-injected, sclera clear OROPHARYNX:no exudate, no erythema and lips, buccal mucosa, and tongue normal  NECK: supple, thyroid normal size, non-tender, without nodularity LYMPH:  no palpable lymphadenopathy in the cervical, axillary or inguinal LUNGS: clear to auscultation and percussion with normal breathing effort HEART: regular rate & rhythm and no murmurs and no lower extremity edema ABDOMEN:abdomen soft, non-tender and normal bowel sounds Musculoskeletal:no cyanosis of digits and no clubbing  NEURO: alert & oriented x 3 with fluent speech, no focal motor/sensory deficits  LABORATORY DATA:  I have reviewed the data as listed    Component Value Date/Time   NA 128 (L) 12/03/2016 1445   K 4.2 12/03/2016 1445   CL 104 08/15/2015 1426   CO2 27 12/03/2016 1445   GLUCOSE 96 12/03/2016 1445   BUN 11.4 12/03/2016 1445   CREATININE 1.1 12/03/2016 1445   CALCIUM 9.4 12/03/2016 1445   PROT 6.3 (L) 12/03/2016 1445   ALBUMIN 4.3 12/03/2016 1445   AST 17 12/03/2016 1445   ALT 12 12/03/2016 1445   ALKPHOS 126 12/03/2016 1445   BILITOT 0.50 12/03/2016 1445   GFRNONAA 35 (L) 05/05/2013 0730   GFRAA 41 (L) 05/05/2013 0730    No results found for: SPEP, UPEP  Lab Results  Component Value Date   WBC 5.5 12/03/2016   NEUTROABS 3.1 12/03/2016   HGB 13.7 12/03/2016   HCT 41.3 12/03/2016   MCV 89.3 12/03/2016   PLT 118 (L) 12/03/2016      Chemistry      Component Value Date/Time   NA 128 (L) 12/03/2016 1445   K 4.2 12/03/2016 1445   CL 104 08/15/2015 1426   CO2 27 12/03/2016 1445   BUN 11.4 12/03/2016 1445   CREATININE 1.1 12/03/2016 1445      Component Value Date/Time   CALCIUM 9.4  12/03/2016 1445   ALKPHOS 126 12/03/2016 1445   AST 17 12/03/2016 1445   ALT 12 12/03/2016 1445   BILITOT 0.50 12/03/2016 1445      ASSESSMENT & PLAN:  Splenic marginal zone b-cell lymphoma ([facility]) Clinically, she have no signs of cancer recurrence Given high risk of cancer recurrence, plan to order imaging study and blood work next year  Thrombocytopenia The cause is unknown, likely due to consumption or prior treatment. It is mild and there is little change compared from previous platelet count. The patient denies recent history of bleeding such as epistaxis, hematuria or hematochezia up from intermittent hemorrhoidal bleeding. She is asymptomatic from the thrombocytopenia. I will observe for now.    Hyponatremia She has intermittent hyponatremia of unknown etiology It could be related to her low salt intake Observe only  Chronic kidney disease, stage  III (moderate) She has mild, intermittent elevated creatinine. She feels well and will continue to follow with nephrologist for medical management.   Orders Placed This Encounter  Procedures  . CT ABDOMEN PELVIS W CONTRAST    Standing Status:   Future    Standing Expiration Date:   12/04/2017    Order Specific Question:   If indicated for the ordered procedure, I authorize the administration of contrast media per Radiology protocol    Answer:   Yes    Order Specific Question:   Preferred imaging location?    Answer:   [facility]    Order Specific Question:   Radiology Contrast Protocol - do NOT remove file path    Answer:   [email or web address]   All questions were answered. The patient knows to call the clinic with any problems, questions or concerns. No barriers to learning was detected. I spent 15 minutes counseling the patient face to face. The total time spent in the appointment was 20 minutes and more than 50% was on counseling and review of test results     Heath Lark,  MD 12/04/2016 8:54 AM

## 2016-12-08 DIAGNOSIS — N390 Urinary tract infection, site not specified: Secondary | ICD-10-CM | POA: Diagnosis not present

## 2016-12-08 DIAGNOSIS — R300 Dysuria: Secondary | ICD-10-CM | POA: Diagnosis not present

## 2016-12-11 ENCOUNTER — Telehealth: Payer: Self-pay | Admitting: Hematology and Oncology

## 2016-12-11 NOTE — Telephone Encounter (Signed)
Scheduled appt per 11/15 los -sending confirmation letter with appts.

## 2016-12-16 DIAGNOSIS — L258 Unspecified contact dermatitis due to other agents: Secondary | ICD-10-CM | POA: Diagnosis not present

## 2016-12-22 DIAGNOSIS — R300 Dysuria: Secondary | ICD-10-CM | POA: Diagnosis not present

## 2016-12-22 DIAGNOSIS — N390 Urinary tract infection, site not specified: Secondary | ICD-10-CM | POA: Diagnosis not present

## 2017-02-24 DIAGNOSIS — D3132 Benign neoplasm of left choroid: Secondary | ICD-10-CM | POA: Diagnosis not present

## 2017-02-24 DIAGNOSIS — H2513 Age-related nuclear cataract, bilateral: Secondary | ICD-10-CM | POA: Diagnosis not present

## 2017-02-24 DIAGNOSIS — H04123 Dry eye syndrome of bilateral lacrimal glands: Secondary | ICD-10-CM | POA: Diagnosis not present

## 2017-05-05 ENCOUNTER — Telehealth: Payer: Self-pay | Admitting: Hematology and Oncology

## 2017-05-05 NOTE — Telephone Encounter (Signed)
Spoke to patient regarding voicemail she left. Patient requested an afternoon appointment on same day she was scheduled.

## 2017-05-13 DIAGNOSIS — C8590 Non-Hodgkin lymphoma, unspecified, unspecified site: Secondary | ICD-10-CM | POA: Diagnosis not present

## 2017-05-13 DIAGNOSIS — N183 Chronic kidney disease, stage 3 (moderate): Secondary | ICD-10-CM | POA: Diagnosis not present

## 2017-05-13 DIAGNOSIS — N3010 Interstitial cystitis (chronic) without hematuria: Secondary | ICD-10-CM | POA: Diagnosis not present

## 2017-06-02 ENCOUNTER — Ambulatory Visit (HOSPITAL_COMMUNITY)
Admission: RE | Admit: 2017-06-02 | Discharge: 2017-06-02 | Disposition: A | Discharge status: Home / Self Care | Payer: PPO | Source: Ambulatory Visit | Attending: Hematology and Oncology | Admitting: Hematology and Oncology

## 2017-06-02 ENCOUNTER — Inpatient Hospital Stay: Payer: PPO | Attending: Hematology and Oncology

## 2017-06-02 ENCOUNTER — Encounter (HOSPITAL_COMMUNITY): Payer: Self-pay

## 2017-06-02 DIAGNOSIS — C8307 Small cell B-cell lymphoma, spleen: Secondary | ICD-10-CM

## 2017-06-02 DIAGNOSIS — C8590 Non-Hodgkin lymphoma, unspecified, unspecified site: Secondary | ICD-10-CM | POA: Diagnosis not present

## 2017-06-02 DIAGNOSIS — I313 Pericardial effusion (noninflammatory): Secondary | ICD-10-CM | POA: Insufficient documentation

## 2017-06-02 DIAGNOSIS — D696 Thrombocytopenia, unspecified: Secondary | ICD-10-CM | POA: Diagnosis not present

## 2017-06-02 DIAGNOSIS — Z8572 Personal history of non-Hodgkin lymphomas: Secondary | ICD-10-CM | POA: Insufficient documentation

## 2017-06-02 LAB — CBC WITH DIFFERENTIAL/PLATELET
Basophils Absolute: 0.1 10*3/uL (ref 0.0–0.1)
Basophils Relative: 1 %
EOS PCT: 1 %
Eosinophils Absolute: 0.1 10*3/uL (ref 0.0–0.5)
HEMATOCRIT: 40.5 % (ref 34.8–46.6)
HEMOGLOBIN: 13.7 g/dL (ref 11.6–15.9)
LYMPHS ABS: 1.8 10*3/uL (ref 0.9–3.3)
LYMPHS PCT: 34 %
MCH: 30.5 pg (ref 25.1–34.0)
MCHC: 33.9 g/dL (ref 31.5–36.0)
MCV: 90.1 fL (ref 79.5–101.0)
Monocytes Absolute: 0.5 10*3/uL (ref 0.1–0.9)
Monocytes Relative: 10 %
NEUTROS ABS: 2.8 10*3/uL (ref 1.5–6.5)
Neutrophils Relative %: 54 %
PLATELETS: 125 10*3/uL — AB (ref 145–400)
RBC: 4.5 MIL/uL (ref 3.70–5.45)
RDW: 12.8 % (ref 11.2–14.5)
WBC: 5.3 10*3/uL (ref 3.9–10.3)

## 2017-06-02 LAB — COMPREHENSIVE METABOLIC PANEL
ALK PHOS: 153 U/L — AB (ref 40–150)
ALT: 15 U/L (ref 0–55)
AST: 20 U/L (ref 5–34)
Albumin: 4.4 g/dL (ref 3.5–5.0)
Anion gap: 6 (ref 3–11)
BILIRUBIN TOTAL: 0.6 mg/dL (ref 0.2–1.2)
BUN: 13 mg/dL (ref 7–26)
CALCIUM: 9.7 mg/dL (ref 8.4–10.4)
CO2: 30 mmol/L — ABNORMAL HIGH (ref 22–29)
CREATININE: 1.1 mg/dL (ref 0.60–1.10)
Chloride: 97 mmol/L — ABNORMAL LOW (ref 98–109)
GFR, EST AFRICAN AMERICAN: 58 mL/min — AB (ref >60)
GFR, EST NON AFRICAN AMERICAN: 50 mL/min — AB (ref >60)
Glucose, Bld: 83 mg/dL (ref 70–140)
Potassium: 4.4 mmol/L (ref 3.5–5.1)
Sodium: 133 mmol/L — ABNORMAL LOW (ref 136–145)
TOTAL PROTEIN: 6.3 g/dL — AB (ref 6.4–8.3)

## 2017-06-02 MED ORDER — IOPAMIDOL (ISOVUE-300) INJECTION 61%
INTRAVENOUS | Status: AC
  Filled 2017-06-02: qty 100

## 2017-06-02 MED ORDER — IOPAMIDOL (ISOVUE-300) INJECTION 61%
100.0000 mL | Freq: Once | INTRAVENOUS | Status: AC | PRN
  Administered 2017-06-02: 100 mL via INTRAVENOUS

## 2017-06-03 ENCOUNTER — Telehealth: Payer: Self-pay | Admitting: Hematology and Oncology

## 2017-06-03 ENCOUNTER — Inpatient Hospital Stay (HOSPITAL_BASED_OUTPATIENT_CLINIC_OR_DEPARTMENT_OTHER): Payer: PPO | Admitting: Hematology and Oncology

## 2017-06-03 DIAGNOSIS — R748 Abnormal levels of other serum enzymes: Secondary | ICD-10-CM

## 2017-06-03 DIAGNOSIS — Z8572 Personal history of non-Hodgkin lymphomas: Secondary | ICD-10-CM | POA: Diagnosis not present

## 2017-06-03 DIAGNOSIS — D696 Thrombocytopenia, unspecified: Secondary | ICD-10-CM

## 2017-06-03 DIAGNOSIS — C8307 Small cell B-cell lymphoma, spleen: Secondary | ICD-10-CM

## 2017-06-03 NOTE — Telephone Encounter (Signed)
Gave patient AVs and calendar of upcoming may 2020 appointments °

## 2017-06-04 ENCOUNTER — Encounter: Payer: Self-pay | Admitting: Hematology and Oncology

## 2017-06-04 DIAGNOSIS — R748 Abnormal levels of other serum enzymes: Secondary | ICD-10-CM | POA: Insufficient documentation

## 2017-06-04 NOTE — Progress Notes (Signed)
[place] OFFICE PROGRESS NOTE  Patient Care Team: Velna Hatchet, MD as PCP - General (Internal Medicine) Fleet Contras, MD as Consulting Physician (Nephrology)  ASSESSMENT & PLAN:  Splenic marginal zone b-cell lymphoma ([place]) Clinically, she have no signs of cancer recurrence The patient is more than 2 years out from prior treatment I recommend discontinuation of surveillance imaging I plan to see her back once a year with history, physical examination and blood work The patient is educated to watch out for signs and symptoms of cancer recurrence  Thrombocytopenia The cause is unknown but could be due to prior treatment. The patient denies recent history of bleeding such as epistaxis, hematuria or hematochezia up from intermittent hemorrhoidal bleeding. She is asymptomatic from the thrombocytopenia. I will observe for now.    Elevated liver enzymes She has mildly elevated liver enzyme She is not symptomatic CT imaging showed normal liver parenchyma I recommend close observation only   No orders of the defined types were placed in this encounter.   INTERVAL HISTORY: Please see below for problem oriented charting. She returns with her husband for further follow-up She feels well Denies recent infection She is active at home.  She visits her mother on a very frequent basis.  Her mother suffer from dementia She denies new lymphadenopathy Her appetite is stable no recent weight loss Denies abnormal night sweats The patient denies any recent signs or symptoms of bleeding such as spontaneous epistaxis, hematuria or hematochezia.   SUMMARY OF ONCOLOGIC HISTORY: Oncology History   Non-Hodgkin lymphoma, marginal zone lymphoma   Primary site: Lymphoid Neoplasms   Staging method: AJCC 6th Edition   Clinical: Stage IV signed by Heath Lark, MD on 05/30/2013  8:47 AM   Summary: Stage IV     Splenic marginal zone b-cell lymphoma ([place])   02/23/2013 Imaging    CT  scan show significant enlargement of liver and spleen      05/05/2013 Bone Marrow Biopsy    Bone marrow aspirate and biopsy confirmed low grade lymphoma, suspect marginal zone lymphoma      05/08/2013 Imaging    PET scan show involvement of liver and spleen      06/02/2013 - 06/23/2013 Chemotherapy    She was started on weekly rituximab.      09/04/2013 Imaging    PET CT scan show complete response to treatment with splenic size reduced back to normal limits. She has persistent mild thrombocytopenia.      12/06/2013 Imaging    PET scan was negative and spleen size is normal      06/06/2014 Imaging    PEt scan is negative      06/05/2015 Imaging    No findings to suggest residual/recurrent splenic lymphoma      06/16/2016 Imaging    1. No enlarged lymph nodes or mass within the chest abdomen or pelvis to suggest residual/recurrent lymphoma. 2. Small pericardial effusion. Unchanged 3. Scar like densities noted within the right middle lobe status post pneumonia.      06/02/2017 Imaging    1. Stable CT of the abdomen and pelvis. No mass or adenopathy to suggest residual/recurrent lymphoma. 2. Similar small pericardial effusion.       REVIEW OF SYSTEMS:   Constitutional: Denies fevers, chills or abnormal weight loss Eyes: Denies blurriness of vision Ears, nose, mouth, throat, and face: Denies mucositis or sore throat Respiratory: Denies cough, dyspnea or wheezes Cardiovascular: Denies palpitation, chest discomfort or lower extremity swelling Gastrointestinal:  Denies  nausea, heartburn or change in bowel habits Skin: Denies abnormal skin rashes Lymphatics: Denies new lymphadenopathy or easy bruising Neurological:Denies numbness, tingling or new weaknesses Behavioral/Psych: Mood is stable, no new changes  All other systems were reviewed with the patient and are negative.  I have reviewed the past medical history, past surgical history, social history and family history with  the patient and they are unchanged from previous note.  ALLERGIES:  is allergic to biaxin [clarithromycin]; erythromycin; soy allergy; zithromax [azithromycin]; and zofran [ondansetron hcl].  MEDICATIONS:  Current Outpatient Medications  Medication Sig Dispense Refill  . Biotin 5 MG TABS Take by mouth.    . cholecalciferol (VITAMIN D) 1000 units tablet Take 1,000 Units by mouth daily. Reported on 07/01/2015    . Lactobacillus (ACIDOPHILUS) 100 MG CAPS Take by mouth.    . Linaclotide (LINZESS) 290 MCG CAPS capsule Take 1 capsule (290 mcg total) by mouth every morning. 30 capsule 5  . metoCLOPramide (REGLAN) 5 MG tablet TAKE 1 TABLET BY MOUTH 2 (TWO) TIMES DAILY AT 10 AM AND 4 PM. 180 tablet 1  . progesterone (PROMETRIUM) 100 MG capsule Take 100 mg by mouth as directed. Reported on 07/01/2015    . temazepam (RESTORIL) 30 MG capsule Take 2 capsules (60 mg total) by mouth at bedtime. 60 capsule 1  . traZODone (DESYREL) 50 MG tablet Take 50 mg by mouth at bedtime.     No current facility-administered medications for this visit.     PHYSICAL EXAMINATION: ECOG PERFORMANCE STATUS: 0 - Asymptomatic  Vitals:   06/03/17 1449  BP: 109/67  Pulse: 70  Resp: 18  Temp: (!) 97.5 F (36.4 C)  SpO2: 100%   Filed Weights   06/03/17 1449  Weight: 135 lb 14.4 oz (61.6 kg)    GENERAL:alert, no distress and comfortable SKIN: skin color, texture, turgor are normal, no rashes or significant lesions EYES: normal, Conjunctiva are pink and non-injected, sclera clear OROPHARYNX:no exudate, no erythema and lips, buccal mucosa, and tongue normal  NECK: supple, thyroid normal size, non-tender, without nodularity LYMPH:  no palpable lymphadenopathy in the cervical, axillary or inguinal LUNGS: clear to auscultation and percussion with normal breathing effort HEART: regular rate & rhythm and no murmurs and no lower extremity edema ABDOMEN:abdomen soft, non-tender and normal bowel sounds Musculoskeletal:no  cyanosis of digits and no clubbing  NEURO: alert & oriented x 3 with fluent speech, no focal motor/sensory deficits  LABORATORY DATA:  I have reviewed the data as listed    Component Value Date/Time   NA 133 (L) 06/02/2017 1204   NA 128 (L) 12/03/2016 1445   K 4.4 06/02/2017 1204   K 4.2 12/03/2016 1445   CL 97 (L) 06/02/2017 1204   CO2 30 (H) 06/02/2017 1204   CO2 27 12/03/2016 1445   GLUCOSE 83 06/02/2017 1204   GLUCOSE 96 12/03/2016 1445   BUN 13 06/02/2017 1204   BUN 11.4 12/03/2016 1445   CREATININE 1.10 06/02/2017 1204   CREATININE 1.1 12/03/2016 1445   CALCIUM 9.7 06/02/2017 1204   CALCIUM 9.4 12/03/2016 1445   PROT 6.3 (L) 06/02/2017 1204   PROT 6.3 (L) 12/03/2016 1445   ALBUMIN 4.4 06/02/2017 1204   ALBUMIN 4.3 12/03/2016 1445   AST 20 06/02/2017 1204   AST 17 12/03/2016 1445   ALT 15 06/02/2017 1204   ALT 12 12/03/2016 1445   ALKPHOS 153 (H) 06/02/2017 1204   ALKPHOS 126 12/03/2016 1445   BILITOT 0.6 06/02/2017 1204   BILITOT 0.50  12/03/2016 1445   GFRNONAA 50 (L) 06/02/2017 1204   GFRAA 58 (L) 06/02/2017 1204    No results found for: SPEP, UPEP  Lab Results  Component Value Date   WBC 5.3 06/02/2017   NEUTROABS 2.8 06/02/2017   HGB 13.7 06/02/2017   HCT 40.5 06/02/2017   MCV 90.1 06/02/2017   PLT 125 (L) 06/02/2017      Chemistry      Component Value Date/Time   NA 133 (L) 06/02/2017 1204   NA 128 (L) 12/03/2016 1445   K 4.4 06/02/2017 1204   K 4.2 12/03/2016 1445   CL 97 (L) 06/02/2017 1204   CO2 30 (H) 06/02/2017 1204   CO2 27 12/03/2016 1445   BUN 13 06/02/2017 1204   BUN 11.4 12/03/2016 1445   CREATININE 1.10 06/02/2017 1204   CREATININE 1.1 12/03/2016 1445      Component Value Date/Time   CALCIUM 9.7 06/02/2017 1204   CALCIUM 9.4 12/03/2016 1445   ALKPHOS 153 (H) 06/02/2017 1204   ALKPHOS 126 12/03/2016 1445   AST 20 06/02/2017 1204   AST 17 12/03/2016 1445   ALT 15 06/02/2017 1204   ALT 12 12/03/2016 1445   BILITOT 0.6  06/02/2017 1204   BILITOT 0.50 12/03/2016 1445       RADIOGRAPHIC STUDIES: I have reviewed multiple imaging study with the patient I have personally reviewed the radiological images as listed and agreed with the findings in the report. Ct Abdomen Pelvis W Contrast  Result Date: 06/03/2017 CLINICAL DATA:  History of non-Hodgkin's lymphoma.  Followup. EXAM: CT ABDOMEN AND PELVIS WITH CONTRAST TECHNIQUE: Multidetector CT imaging of the abdomen and pelvis was performed using the standard protocol following bolus administration of intravenous contrast. CONTRAST:  144m ISOVUE-300 IOPAMIDOL (ISOVUE-300) INJECTION 61% COMPARISON:  06/16/2016 FINDINGS: Lower chest: No acute abnormality.  Small pericardial effusion. Hepatobiliary: Multiple liver cysts are again noted. No new suspicious liver abnormality. The gallbladder appears normal. No biliary dilatation. Pancreas: Unremarkable. No pancreatic ductal dilatation or surrounding inflammatory changes. Spleen: Normal in size without focal abnormality. Adrenals/Urinary Tract: Normal appearance of the adrenal glands. Small low attenuation foci within both kidneys are again noted and appear similar to previous exam, likely representing small cysts. No mass or hydronephrosis identified. The urinary bladder appears normal. Stomach/Bowel: The stomach appears normal. No pathologic dilatation of the large or small bowel loops. Vascular/Lymphatic: Normal appearance of the abdominal aorta. No enlarged lymph nodes identified within the abdomen or pelvis. No inguinal adenopathy. Reproductive: Uterus and the adnexal structures are unremarkable. Other: No abdominal or pelvic adenopathy.  No inguinal adenopathy. Musculoskeletal: Degenerative disc disease noted within the lumbar spine. IMPRESSION: 1. Stable CT of the abdomen and pelvis. No mass or adenopathy to suggest residual/recurrent lymphoma. 2. Similar small pericardial effusion. Electronically Signed   By: TKerby MoorsM.D.    On: 06/03/2017 09:24    All questions were answered. The patient knows to call the clinic with any problems, questions or concerns. No barriers to learning was detected.  I spent 15 minutes counseling the patient face to face. The total time spent in the appointment was 20 minutes and more than 50% was on counseling and review of test results  NHeath Lark MD 06/04/2017 8:39 AM

## 2017-06-04 NOTE — Assessment & Plan Note (Signed)
She has mildly elevated liver enzyme She is not symptomatic CT imaging showed normal liver parenchyma I recommend close observation only

## 2017-06-04 NOTE — Assessment & Plan Note (Signed)
Clinically, she have no signs of cancer recurrence The patient is more than 2 years out from prior treatment I recommend discontinuation of surveillance imaging I plan to see her back once a year with history, physical examination and blood work The patient is educated to watch out for signs and symptoms of cancer recurrence

## 2017-06-04 NOTE — Assessment & Plan Note (Addendum)
The cause is unknown but could be due to prior treatment. The patient denies recent history of bleeding such as epistaxis, hematuria or hematochezia up from intermittent hemorrhoidal bleeding. She is asymptomatic from the thrombocytopenia. I will observe for now.   

## 2017-06-15 ENCOUNTER — Encounter: Payer: Self-pay | Admitting: Family Medicine

## 2017-06-23 DIAGNOSIS — H2513 Age-related nuclear cataract, bilateral: Secondary | ICD-10-CM | POA: Diagnosis not present

## 2017-06-23 DIAGNOSIS — H04123 Dry eye syndrome of bilateral lacrimal glands: Secondary | ICD-10-CM | POA: Diagnosis not present

## 2017-06-23 DIAGNOSIS — D3132 Benign neoplasm of left choroid: Secondary | ICD-10-CM | POA: Diagnosis not present

## 2017-08-01 IMAGING — MG MM DIGITAL DIAGNOSTIC BILAT W/ TOMO W/ CAD
8 of 12 series · 8 of 28 positions shown · non-contrast
Comparison: Previous exam(s).

CLINICAL DATA: Six-month follow-up for a left breast probably
benign mass. The patient also states she had bilateral breast pain 1
week ago which has resolved in the right breast and has diminished
in the left breast.

EXAM:
2D DIGITAL DIAGNOSTIC BILATERAL MAMMOGRAM WITH CAD AND ADJUNCT TOMO
LEFT BREAST ULTRASOUND

[L MLO synth-2D]
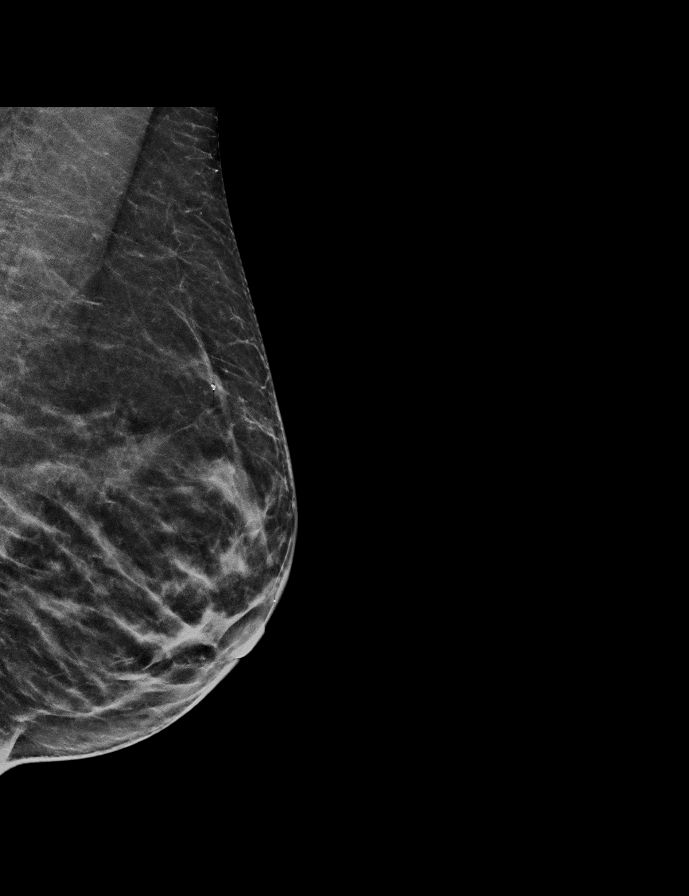

[L CC]
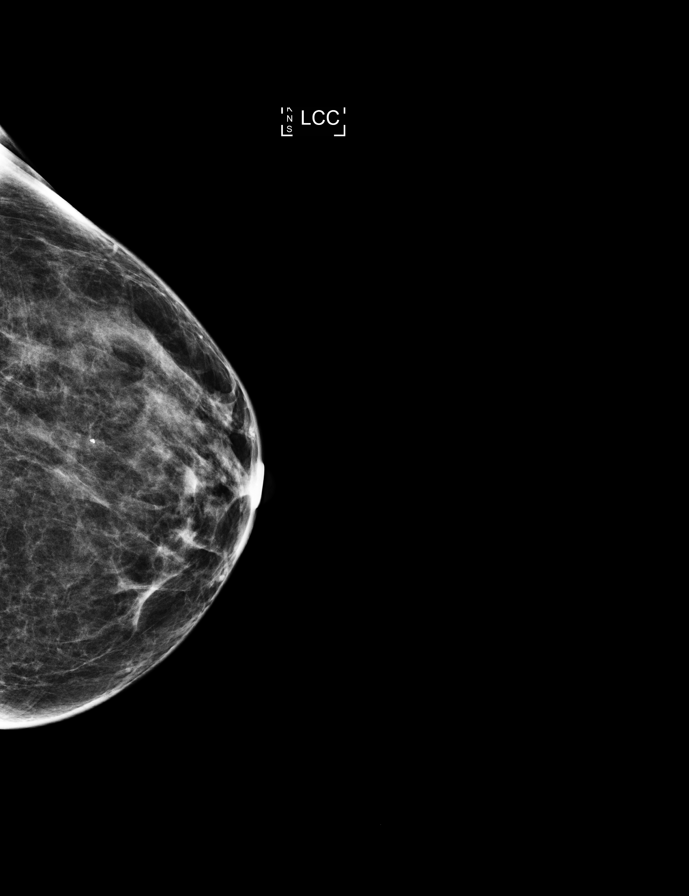

[R MLO]
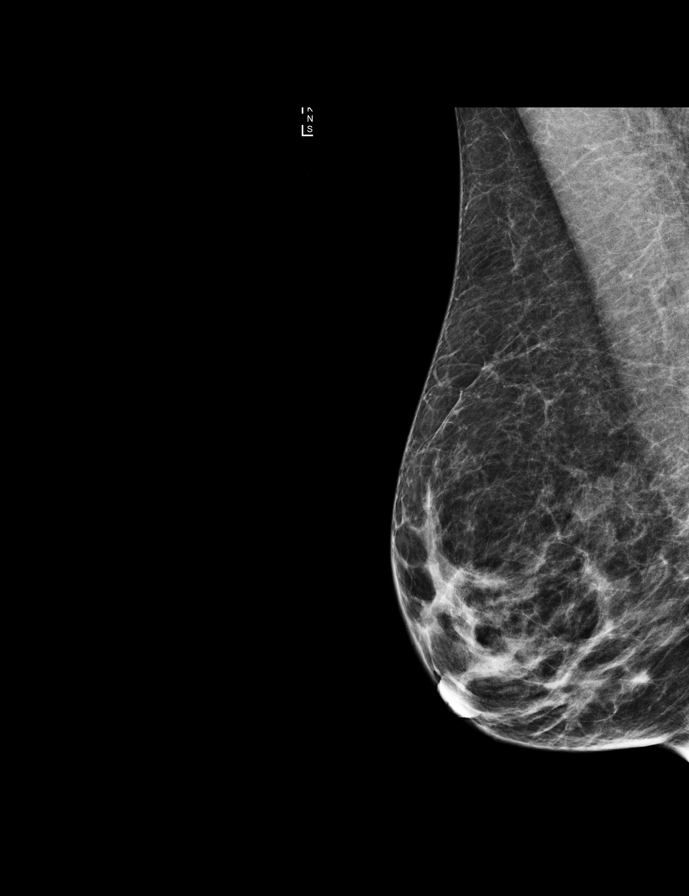

[L CC synth-2D]
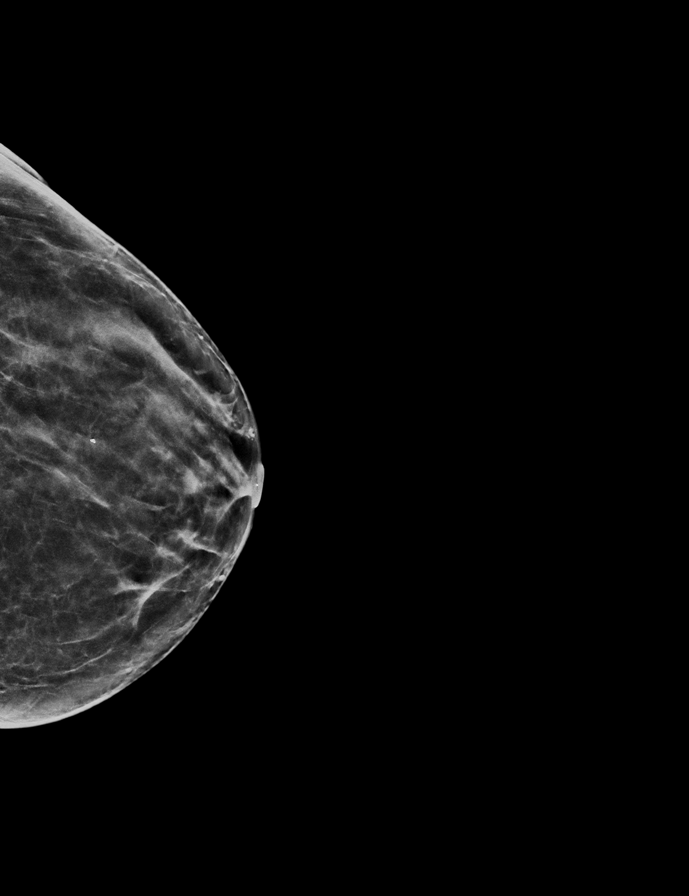

[R CC synth-2D]
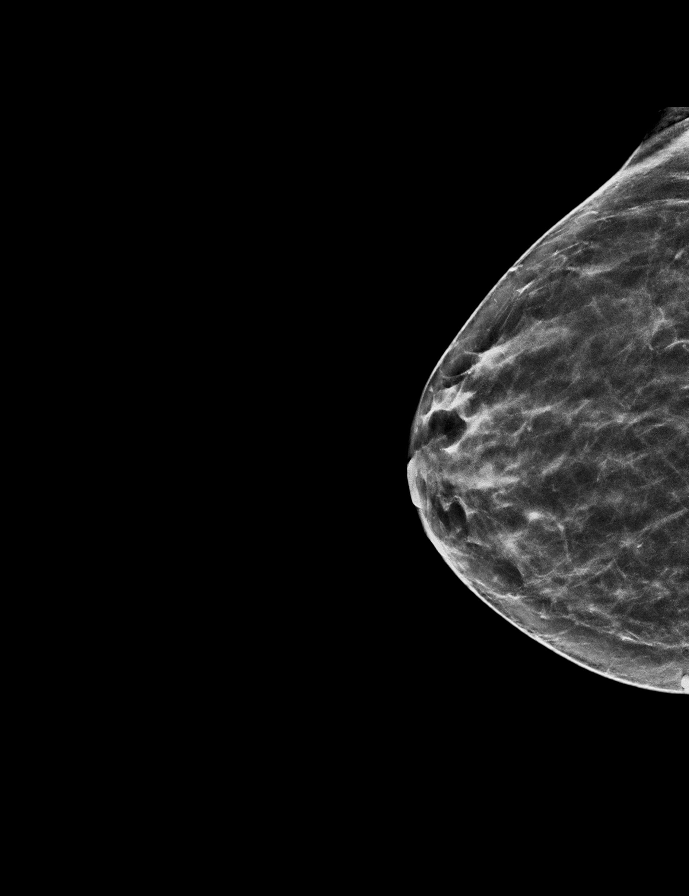

[R MLO synth-2D]
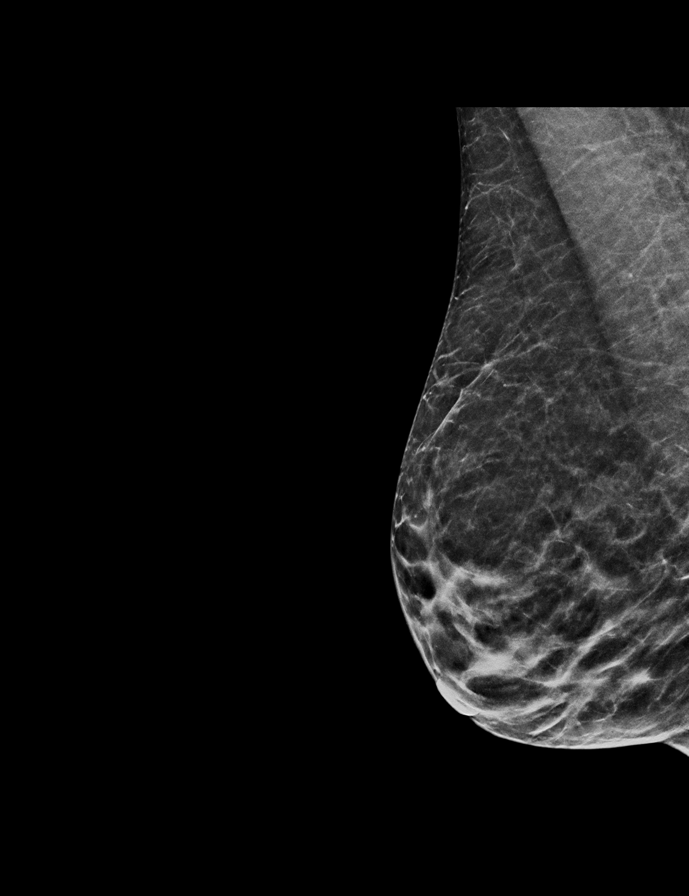

[R CC]
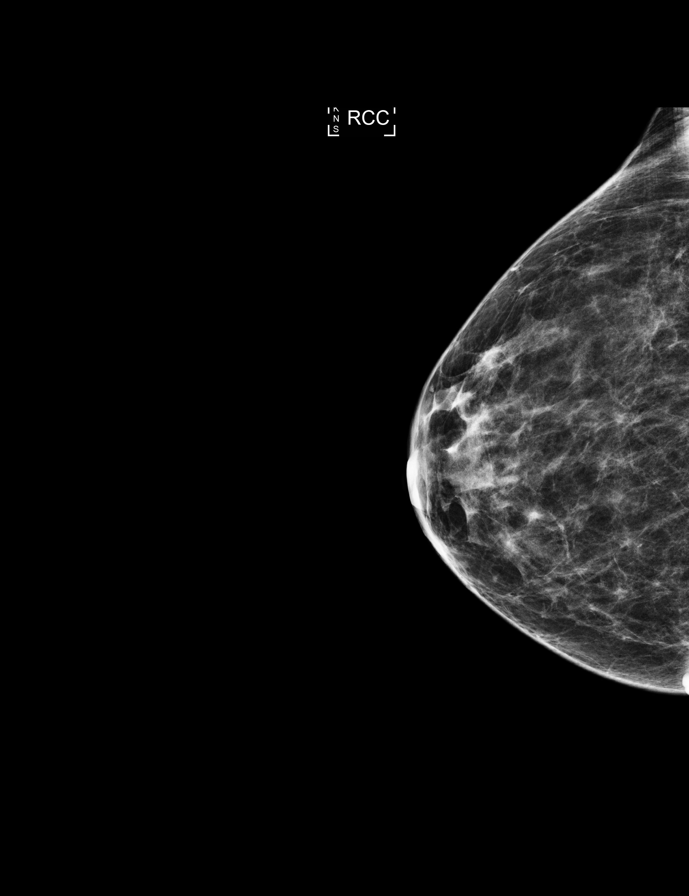

[L MLO]
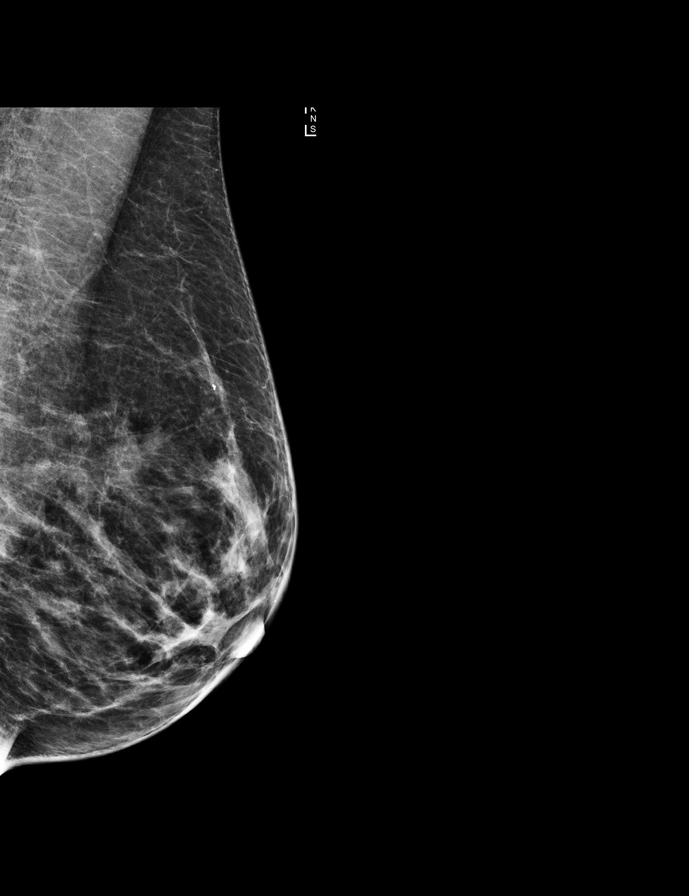

[8 of 28 positions shown; findings below may reference images not displayed]

ACR Breast Density Category c: The breast tissue is heterogeneously
dense, which may obscure small masses.
FINDINGS: No suspicious calcifications, masses or areas of distortion are seen
in the bilateral breasts.

Mammographic images were processed with CAD.

No discrete palpable masses are identified in the left breast.

Ultrasound targeted to the left breast at 2 o'clock, 2 cm from the
nipple demonstrates a heterogeneous hypoechoic oval mass measuring 6
x 4 x 2 mm, stable from prior.
IMPRESSION: 1.  The probably benign left breast mass at 2 o'clock is stable.

2. No mammographic explanation for the patient's bilateral breast
pain which has resolved on the right.

RECOMMENDATION:
1. Six-month follow-up left breast ultrasound is recommended to
ensure continued stability of the left breast mass.

2. Clinical follow-up recommended for the bilateral breast pain. Any
further workup should be based on clinical grounds.

I have discussed the findings and recommendations with the patient.
Results were also provided in writing at the conclusion of the
visit. If applicable, a reminder letter will be sent to the patient
regarding the next appointment.

BI-RADS CATEGORY  3: Probably benign.

## 2017-08-03 ENCOUNTER — Encounter: Payer: Self-pay | Admitting: Nurse Practitioner

## 2017-08-03 ENCOUNTER — Ambulatory Visit: Payer: PPO | Admitting: Nurse Practitioner

## 2017-08-03 VITALS — BP 100/72 | HR 85 | Ht 67.0 in | Wt 136.0 lb

## 2017-08-03 DIAGNOSIS — Z8719 Personal history of other diseases of the digestive system: Secondary | ICD-10-CM | POA: Diagnosis not present

## 2017-08-03 DIAGNOSIS — R1310 Dysphagia, unspecified: Secondary | ICD-10-CM | POA: Diagnosis not present

## 2017-08-03 NOTE — Patient Instructions (Signed)
If you are age 69 or older, your body mass index should be between 23-30. Your Body mass index is 21.3 kg/m. If this is out of the aforementioned range listed, please consider follow up with your Primary Care Provider.  If you are age 74 or younger, your body mass index should be between 19-25. Your Body mass index is 21.3 kg/m. If this is out of the aformentioned range listed, please consider follow up with your Primary Care Provider.   You have been scheduled for an endoscopy. Please follow written instructions given to you at your visit today. If you use inhalers (even only as needed), please bring them with you on the day of your procedure. Your physician has requested that you go to www.startemmi.com and enter the access code given to you at your visit today. This web site gives a general overview about your procedure. However, you should still follow specific instructions given to you by our office regarding your preparation for the procedure.  Call Nephrologist regarding starting Zantac 150 mg twice daily and let us know his response.  Thank you for choosing me and [facility].   Tye Savoy, NP

## 2017-08-03 NOTE — Progress Notes (Signed)
IMPRESSION and PLAN:    #77.  67 year old female with history of peptic strictures requiring dilation. Now with recurrent solid food dysphagia nearly 3 years after last balloon dilation of a thick Schatzki's ring.  -Will arrange for repeat EGD with dilation. The risks and benefits of EGD were discussed and the patient agrees to proceed.  -Advised patient to eat small bites, chew well with liquids in between bites to avoid food impaction. -She doesn't want to take a PPI as Nephrologist advised against it. I recommended Zantac 150 mg BID if Nephrologist concurs. She will call Nephrologist and let us know.     #2.  Colon cancer screening.  She should be up-to-date on this.  Her last colonoscopy was in 2011 with findings of only mild diverticulosis      HPI:    Chief Complaint: swallowing problems.    Patient is a 67 year old female with a history of non-Hodgkin's lymphoma, two years out from treatment with no signs of recurrence. She is known to Dr. Ardis Hughs for hx of irritable bowel syndrome and a history of recurrent esophageal strictures.  Lennon is actually done well since her last EGD with dilation nearly 3 years ago.  At that time a thick Schatzki's ring was found and balloon dilated up to 20 mm.  We advised her to resume PPI but apparently her Nephrologist preferred she didn't. Sibyl returns with recurrent solid food dysphagia.  Problem is progressive and she is now having a hard time tolerating almost any solids.  She frequently has to induce vomiting to get the food dislodged.  When food gets stuck she has a hard time even swallowing liquids.  At home she does certain maneuvers such as jumping up and down to try to move the food into her stomach.  No odynophagia. No GERD symptoms such as heartburn or regurgitation.    Review of systems:     No chest pain, no SOB, no fevers, no urinary sx   Past Medical History:  Diagnosis Date  . Chronic interstitial cystitis   . Chronic  kidney disease (CKD), stage II (mild) 05/25/2014  . Diverticulosis    mild   . GERD (gastroesophageal reflux disease)   . H/O: iron deficiency anemia   . Hemorrhoids   . Hemorrhoids 07/26/2013  . Hiatal hernia    small   . IBS (irritable bowel syndrome)   . Insomnia, unspecified   . Interstitial cystitis   . 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 Non Hodgkin's lymphoma ([place]) 04/19/2013  . Raynaud's syndrome   . Splenomegaly   . Stricture and stenosis of esophagus   . Thrombocytopenia, unspecified ([place])   . Unspecified constipation     Patient's surgical history, family medical history, social history, medications and allergies were all reviewed in Epic   Creatinine clearance cannot be calculated (Patient's most recent lab result is older than the maximum 21 days allowed.)   Physical Exam:     BP 100/72   Pulse 85   Ht 5\' 7"  (1.702 m)   Wt 136 lb (61.7 kg)   BMI 21.30 kg/m   GENERAL:  Pleasant female in NAD PSYCH: : Cooperative, normal affect EENT:  conjunctiva pink, mucous membranes moist, neck supple without masses CARDIAC:  RRR, no murmur heard, no peripheral edema PULM: Normal respiratory effort, lungs CTA bilaterally, no wheezing ABDOMEN:  Nondistended, soft, nontender. No obvious masses, no hepatomegaly,  normal bowel sounds SKIN:  turgor, no lesions seen  Musculoskeletal:  Normal muscle tone, normal strength NEURO: Alert and oriented x 3, no focal neurologic deficits   Tye Savoy , NP 08/03/2017, 2:23 PM

## 2017-08-04 ENCOUNTER — Encounter: Payer: Self-pay | Admitting: Nurse Practitioner

## 2017-08-05 ENCOUNTER — Encounter: Payer: Self-pay | Admitting: Gastroenterology

## 2017-08-05 NOTE — Progress Notes (Signed)
I agree with the above note, plan 

## 2017-08-18 ENCOUNTER — Ambulatory Visit (AMBULATORY_SURGERY_CENTER): Payer: PPO | Admitting: Gastroenterology

## 2017-08-18 ENCOUNTER — Encounter: Payer: Self-pay | Admitting: Gastroenterology

## 2017-08-18 ENCOUNTER — Telehealth: Payer: Self-pay | Admitting: Nurse Practitioner

## 2017-08-18 VITALS — BP 108/57 | HR 75 | Temp 96.0°F | Resp 21 | Ht 67.0 in | Wt 136.0 lb

## 2017-08-18 DIAGNOSIS — R1310 Dysphagia, unspecified: Secondary | ICD-10-CM | POA: Diagnosis not present

## 2017-08-18 DIAGNOSIS — K222 Esophageal obstruction: Secondary | ICD-10-CM

## 2017-08-18 DIAGNOSIS — F329 Major depressive disorder, single episode, unspecified: Secondary | ICD-10-CM | POA: Diagnosis not present

## 2017-08-18 DIAGNOSIS — K449 Diaphragmatic hernia without obstruction or gangrene: Secondary | ICD-10-CM | POA: Diagnosis not present

## 2017-08-18 DIAGNOSIS — Z8719 Personal history of other diseases of the digestive system: Secondary | ICD-10-CM | POA: Diagnosis not present

## 2017-08-18 DIAGNOSIS — I7300 Raynaud's syndrome without gangrene: Secondary | ICD-10-CM | POA: Diagnosis not present

## 2017-08-18 MED ORDER — SODIUM CHLORIDE 0.9 % IV SOLN: 500.0000 mL | Freq: Once | INTRAVENOUS | Status: DC

## 2017-08-18 NOTE — Telephone Encounter (Signed)
Patient states she checked with her kidney doctor and zantac was approved to take. Patient would like medication zantac that Nevin Bloodgood discussed in ov, called into [facility].

## 2017-08-18 NOTE — Patient Instructions (Addendum)
  Thank you for allowing [place] to care for you today.  Fluids for the first two hours, followed by soft foods for remainder of the day.  Repeat upper endoscopy as needed for re-treatment.     YOU HAD AN ENDOSCOPIC PROCEDURE TODAY AT [facility]:   Refer to the procedure report that was given to you for any specific questions about what was found during the examination.  If the procedure report does not answer your questions, please call your gastroenterologist to clarify.  If you requested that your care partner not be given the details of your procedure findings, then the procedure report has been included in a sealed envelope for you to review at your convenience later.  YOU SHOULD EXPECT: Some feelings of bloating in the abdomen. Passage of more gas than usual.  Walking can help get rid of the air that was put into your GI tract during the procedure and reduce the bloating. If you had a lower endoscopy (such as a colonoscopy or flexible sigmoidoscopy) you may notice spotting of blood in your stool or on the toilet paper. If you underwent a bowel prep for your procedure, you may not have a normal bowel movement for a few days.  Please Note:  You might notice some irritation and congestion in your nose or some drainage.  This is from the oxygen used during your procedure.  There is no need for concern and it should clear up in a day or so.  SYMPTOMS TO REPORT IMMEDIATELY:     Following upper endoscopy (EGD)  Vomiting of blood or coffee ground material  New chest pain or pain under the shoulder blades  Painful or persistently difficult swallowing  New shortness of breath  Fever of 100F or higher  Black, tarry-looking stools  For urgent or emergent issues, a gastroenterologist can be reached at any hour by calling [phone].   DIET:  We do recommend a small meal at first, but then you may proceed to your regular diet.  Drink plenty of fluids but you should avoid  alcoholic beverages for 24 hours.  ACTIVITY:  You should plan to take it easy for the rest of today and you should NOT DRIVE or use heavy machinery until tomorrow (because of the sedation medicines used during the test).    FOLLOW UP: Our staff will call the number listed on your records the next business day following your procedure to check on you and address any questions or concerns that you may have regarding the information given to you following your procedure. If we do not reach you, we will leave a message.  However, if you are feeling well and you are not experiencing any problems, there is no need to return our call.  We will assume that you have returned to your regular daily activities without incident.  If any biopsies were taken you will be contacted by phone or by letter within the next 1-3 weeks.  Please call us at [phone] if you have not heard about the biopsies in 3 weeks.    SIGNATURES/CONFIDENTIALITY: You and/or your care partner have signed paperwork which will be entered into your electronic medical record.  These signatures attest to the fact that that the information above on your After Visit Summary has been reviewed and is understood.  Full responsibility of the confidentiality of this discharge information lies with you and/or your care-partner.

## 2017-08-18 NOTE — Progress Notes (Signed)
Pt's states no medical or surgical changes since previsit or office visit. 

## 2017-08-18 NOTE — Progress Notes (Signed)
Report to PACU, RN, vss, BBS= Clear.  

## 2017-08-18 NOTE — Op Note (Signed)
[facility] Patient Name: Shannon Barajas Procedure Date: 08/18/2017 10:01 AM MRN: [id number] Endoscopist: [name] , MD Age: 67 Referring MD:  Date of Birth: [birth year] Gender: Female Account #: [id number] Procedure:                Upper GI endoscopy Indications:              Dysphagia; h/os benign GE junction stricture                            dilated multiple times by Dr. Henrene Pastor previously.                            Most recent EGD was 2016 with balloon dilation to                            46mm. Medicines:                Monitored Anesthesia Care Procedure:                Pre-Anesthesia Assessment:                           - Prior to the procedure, a History and Physical                            was performed, and patient medications and                            allergies were reviewed. The patient's tolerance of                            previous anesthesia was also reviewed. The risks                            and benefits of the procedure and the sedation                            options and risks were discussed with the patient.                            All questions were answered, and informed consent                            was obtained. Prior Anticoagulants: The patient has                            taken no previous anticoagulant or antiplatelet                            agents. ASA Grade Assessment: II - A patient with                            mild systemic disease. After reviewing the risks  and benefits, the patient was deemed in                            satisfactory condition to undergo the procedure.                           After obtaining informed consent, the endoscope was                            passed under direct vision. Throughout the                            procedure, the patient's blood pressure, pulse, and                            oxygen saturations were monitored continuously. The                            Endoscope was introduced through the mouth, and                            advanced to the duodenal bulb. The upper GI                            endoscopy was accomplished without difficulty. The                            patient tolerated the procedure well. Scope In: Scope Out: Findings:                 One benign-appearing, intrinsic mild stenosis                            (thick Schatzki's type ring) was found at the                            gastroesophageal junction. A TTS dilator was passed                            through the scope. Dilation with an 18-19-20 mm                            balloon dilator was performed to 19 mm. There was                            the usual superficial mucosa tear and self limited                            oozing following dilation.                           The exam was otherwise without abnormality. Complications:            No immediate complications. Estimated blood loss:  None. Estimated Blood Loss:     Estimated blood loss: none. Impression:               - Benign-appearing GE junction stricure (thick                            Schatzki's ring) dilated to 93mm                           - The examination was otherwise normal. Recommendation:           - Patient has a contact number available for                            emergencies. The signs and symptoms of potential                            delayed complications were discussed with the                            patient. Return to normal activities tomorrow.                            Written discharge instructions were provided to the                            patient.                           - Resume previous diet.                           - Continue present medications.                           - Repeat upper endoscopy PRN for retreatment. [name], MD 08/18/2017 10:20:20 AM This report has been signed  electronically.

## 2017-08-19 ENCOUNTER — Other Ambulatory Visit: Payer: Self-pay

## 2017-08-19 ENCOUNTER — Telehealth: Payer: Self-pay

## 2017-08-19 MED ORDER — RANITIDINE HCL 150 MG PO TABS: 150.0000 mg | ORAL_TABLET | Freq: Two times a day (BID) | ORAL | 2 refills | Status: DC

## 2017-08-19 NOTE — Telephone Encounter (Signed)
Prescription sent electronically to [facility].  Pt advised.

## 2017-08-19 NOTE — Telephone Encounter (Signed)
  Follow up Call-  Call back number 08/18/2017  Post procedure Call Back phone  # [phone]  Permission to leave phone message Yes  Some recent data might be hidden     Patient questions:  Do you have a fever, pain , or abdominal swelling? No. Pain Score  0 *  Have you tolerated food without any problems? Yes.    Have you been able to return to your normal activities? Yes.    Do you have any questions about your discharge instructions: Diet   No. Medications  No. Follow up visit  No.  Do you have questions or concerns about your Care? No.  Actions: * If pain score is 4 or above: No action needed, pain <4.

## 2017-09-03 DIAGNOSIS — M25531 Pain in right wrist: Secondary | ICD-10-CM | POA: Diagnosis not present

## 2017-09-08 DIAGNOSIS — S52531A Colles' fracture of right radius, initial encounter for closed fracture: Secondary | ICD-10-CM | POA: Diagnosis not present

## 2017-09-08 DIAGNOSIS — M25531 Pain in right wrist: Secondary | ICD-10-CM | POA: Diagnosis not present

## 2017-09-09 ENCOUNTER — Telehealth: Payer: Self-pay | Admitting: Hematology and Oncology

## 2017-09-09 NOTE — Telephone Encounter (Signed)
Printed medical records for p/u, Release ID: [id number]

## 2017-09-12 DIAGNOSIS — M25561 Pain in right knee: Secondary | ICD-10-CM | POA: Diagnosis not present

## 2017-09-22 DIAGNOSIS — Z4789 Encounter for other orthopedic aftercare: Secondary | ICD-10-CM | POA: Diagnosis not present

## 2017-09-22 DIAGNOSIS — S52531A Colles' fracture of right radius, initial encounter for closed fracture: Secondary | ICD-10-CM | POA: Diagnosis not present

## 2017-09-22 DIAGNOSIS — M25531 Pain in right wrist: Secondary | ICD-10-CM | POA: Diagnosis not present

## 2017-09-28 ENCOUNTER — Other Ambulatory Visit: Payer: Self-pay

## 2017-09-28 ENCOUNTER — Emergency Department (HOSPITAL_COMMUNITY)
Admission: EM | Admit: 2017-09-28 | Discharge: 2017-09-28 | Disposition: A | Discharge status: Home / Self Care | Payer: PPO | Attending: Emergency Medicine | Admitting: Emergency Medicine

## 2017-09-28 DIAGNOSIS — Z79899 Other long term (current) drug therapy: Secondary | ICD-10-CM | POA: Insufficient documentation

## 2017-09-28 DIAGNOSIS — M25561 Pain in right knee: Secondary | ICD-10-CM | POA: Diagnosis not present

## 2017-09-28 DIAGNOSIS — Z7722 Contact with and (suspected) exposure to environmental tobacco smoke (acute) (chronic): Secondary | ICD-10-CM | POA: Diagnosis not present

## 2017-09-28 DIAGNOSIS — N182 Chronic kidney disease, stage 2 (mild): Secondary | ICD-10-CM | POA: Insufficient documentation

## 2017-09-28 DIAGNOSIS — Z8572 Personal history of non-Hodgkin lymphomas: Secondary | ICD-10-CM | POA: Insufficient documentation

## 2017-09-28 NOTE — ED Triage Notes (Addendum)
Pt to ED with c/o of knee swelling and pain. Pt states she had a fall 08/15 to both knees from standing position and fractured her right wrist. Pt states at the time she had knee xrays and they were fine. Pt states since then her knee pain and swelling has been getting progressively worse. Pt states the pain in right knee is 9/10 and she can barely walk. Pt states she's been taking tylenol with some relief and hydrocodones at night but when wakes up she's in pain again.

## 2017-09-28 NOTE — ED Provider Notes (Signed)
[facility] DEPT Provider Note   CSN: [id number] Arrival date & time: 09/28/17  4193     History   Chief Complaint Chief Complaint  Patient presents with  . Knee Pain    Right    HPI Shannon Barajas is a 67 y.o. female.  Pt comes in with c/o right knee pain and swelling that has been going on intermittently since 8/15 when she fell. She broke her wrist with that fall. She states that about 2 weeks ago she went to her pcp and had a x-ray of her knee because of the symptoms and the x-ray was normal. She is continuing to have pain and swelling. She has been using nsaids and elevation. She has not used any bracing. Denies any redness or warmth     Past Medical History:  Diagnosis Date  . Chronic interstitial cystitis   . Chronic kidney disease (CKD), stage II (mild) 05/25/2014  . Diverticulosis    mild   . GERD (gastroesophageal reflux disease)   . H/O: iron deficiency anemia   . Hemorrhoids   . Hemorrhoids 07/26/2013  . Hiatal hernia    small   . IBS (irritable bowel syndrome)   . Insomnia, unspecified   . Interstitial cystitis   . 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 Non Hodgkin's lymphoma ([place]) 04/19/2013  . Raynaud's syndrome   . Splenomegaly   . Stricture and stenosis of esophagus   . Thrombocytopenia, unspecified ([place])   . Unspecified constipation     Patient Active Problem List   Diagnosis Date Noted  . Elevated liver enzymes 06/04/2017  . Multiple lung nodules on CT 08/15/2015  . Quality of life palliative care encounter 06/07/2015  . 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 Left breast lump 11/15/2014  . Dysphagia 08/11/2014  . Chronic kidney disease (CKD), stage II (mild) 05/25/2014  . Acute dystonic reaction due to drugs 05/25/2014  . Bilateral lower extremity edema 05/25/2014  . Chronic kidney disease, stage III (moderate) ([place]) 09/05/2013  . Hemorrhoids 07/26/2013  . Hyponatremia 07/26/2013    . Leg edema 06/23/2013  . Splenic marginal zone b-cell lymphoma ([place]) 05/30/2013  . Thrombocytopenia ([place]) 10/10/2009  . CONSTIPATION 10/10/2009  . NONSPECIFIC ABN FINDING RAD & OTH EXAM GI TRACT 09/06/2009  . RECTAL BLEEDING 07/10/2009  . HIP PAIN, LEFT 08/28/2008  . SPRAIN&STRAIN OTHER SPECIFIED SITES KNEE&LEG 08/28/2008  . OTHER NONTHROMBOCYTOPENIC PURPURAS 07/27/2008  . WEIGHT LOSS, ABNORMAL 07/27/2008  . DERMATOPHYTOSIS OF NAIL 06/04/2008  . GERD 05/24/2008  . Rectal bleeding 05/24/2008  . HEMORRHOIDS 05/22/2008  . ESOPHAGEAL STRICTURE 05/22/2008  . HIATAL HERNIA 05/22/2008  . PLANTAR WART 08/04/2007  . INSOMNIA, SEVERE 08/04/2007  . RAYNAUD'S SYNDROME 08/30/2006  . IRRITABLE BOWEL SYNDROME 08/30/2006  . DIARRHEA, CHRONIC 08/30/2006    Past Surgical History:  Procedure Laterality Date  . APPENDECTOMY  2012  . APPENDECTOMY  2012  . BALLOON DILATION N/A 09/06/2014   Procedure: BALLOON DILATION;  Surgeon: [name], MD;  Location: [facility];  Service: Endoscopy;  Laterality: N/A;  . BREAST LUMPECTOMY    . ESOPHAGEAL DILATION    . ESOPHAGOGASTRODUODENOSCOPY (EGD) WITH PROPOFOL N/A 09/06/2014   Procedure: ESOPHAGOGASTRODUODENOSCOPY (EGD) WITH PROPOFOL;  Surgeon: [name], MD;  Location: [facility];  Service: Endoscopy;  Laterality: N/A;  . TONSILLECTOMY    . TUBAL LIGATION    . VIDEO BRONCHOSCOPY Bilateral 08/23/2015   Procedure: VIDEO BRONCHOSCOPY WITH FLUORO;  Surgeon: Creig Hines  Earney Hamburg, MD;  Location: [facility];  Service: Cardiopulmonary;  Laterality: Bilateral;     OB History   None      Home Medications    Prior to Admission medications   Medication Sig Start Date End Date Taking? Authorizing Provider  Biotin 5 MG TABS Take by mouth.    [provider]  cholecalciferol (VITAMIN D) 1000 units tablet Take 1,000 Units by mouth daily. Reported on 07/01/2015    [provider]  Lactobacillus (ACIDOPHILUS) 100 MG CAPS Take by  mouth.    [provider]  Linaclotide Rolan Lipa) 290 MCG CAPS capsule Take 1 capsule (290 mcg total) by mouth every morning. 07/14/13   Ricard Dillon, MD  metoCLOPramide (REGLAN) 5 MG tablet TAKE 1 TABLET BY MOUTH 2 (TWO) TIMES DAILY AT 10 AM AND 4 PM.    Ricard Dillon, MD  progesterone (PROMETRIUM) 100 MG capsule Take 100 mg by mouth as directed. Reported on 07/01/2015 04/27/13   [provider]  ranitidine (ZANTAC) 150 MG tablet Take 1 tablet (150 mg total) by mouth 2 (two) times daily. Take 30-60 minutes before breakfast and dinner. 08/19/17   Willia Craze, NP  temazepam (RESTORIL) 30 MG capsule Take 2 capsules (60 mg total) by mouth at bedtime. 12/18/13   Hoyt Koch, MD  traZODone (DESYREL) 50 MG tablet Take 50 mg by mouth at bedtime.    [provider]    Family History Family History  Problem Relation Age of Onset  . Lymphoma Father        lymphoma  . Cirrhosis Father        heavy drinker  . Diabetes Maternal Grandmother   . Colon cancer Maternal Grandfather   . Cancer Maternal Grandfather        liver ca ?    Social History Social History   Tobacco Use  . Smoking status: Passive Smoke Exposure - Never Smoker  . Smokeless tobacco: Never Used  . Tobacco comment: Father growing up & daughter currently  Substance Use Topics  . Alcohol use: No  . Drug use: No     Allergies   Biaxin [clarithromycin]; Erythromycin; Soy allergy; Zithromax [azithromycin]; and Zofran [ondansetron hcl]   Review of Systems Review of Systems  All other systems reviewed and are negative.    Physical Exam Updated Vital Signs BP 121/70 (BP Location: Left Arm)   Pulse 69   Temp 97.9 F (36.6 C) (Oral)   Resp 16   Ht 5\' 7"  (1.702 m)   Wt 62.1 kg   SpO2 99%   BMI 21.46 kg/m   Physical Exam  Constitutional: She is oriented to person, place, and time. She appears well-developed and well-nourished.  Cardiovascular: Normal rate.  Pulmonary/Chest:  Effort normal.  Musculoskeletal:  Swelling noted to the right knee. Full rom  Neurological: She is alert and oriented to person, place, and time.  Skin: Skin is warm and dry.  Psychiatric: She has a normal mood and affect.  Nursing note and vitals reviewed.    ED Treatments / Results  Labs (all labs ordered are listed, but only abnormal results are displayed) Labs Reviewed - No data to display  EKG None  Radiology No results found.  Procedures Procedures (including critical care time)  Medications Ordered in ED Medications - No data to display   Initial Impression / Assessment and Plan / ED Course  I have reviewed the triage vital signs and the nursing notes.  Pertinent  labs & imaging results that were available during my care of the patient were reviewed by me and considered in my medical decision making (see chart for details).     Splint placed. Discussed follow up with ortho  Final Clinical Impressions(s) / ED Diagnoses   Final diagnoses:  None    ED Discharge Orders    None       Glendell Docker, NP 09/28/17 [place], DO 09/28/17 [id number]

## 2017-09-30 DIAGNOSIS — M25561 Pain in right knee: Secondary | ICD-10-CM | POA: Diagnosis not present

## 2017-10-06 DIAGNOSIS — R946 Abnormal results of thyroid function studies: Secondary | ICD-10-CM | POA: Diagnosis not present

## 2017-10-06 DIAGNOSIS — N183 Chronic kidney disease, stage 3 (moderate): Secondary | ICD-10-CM | POA: Diagnosis not present

## 2017-10-06 DIAGNOSIS — R82998 Other abnormal findings in urine: Secondary | ICD-10-CM | POA: Diagnosis not present

## 2017-10-06 DIAGNOSIS — Z79899 Other long term (current) drug therapy: Secondary | ICD-10-CM | POA: Diagnosis not present

## 2017-10-06 DIAGNOSIS — M859 Disorder of bone density and structure, unspecified: Secondary | ICD-10-CM | POA: Diagnosis not present

## 2017-10-08 DIAGNOSIS — M25561 Pain in right knee: Secondary | ICD-10-CM | POA: Diagnosis not present

## 2017-10-13 DIAGNOSIS — H6123 Impacted cerumen, bilateral: Secondary | ICD-10-CM | POA: Diagnosis not present

## 2017-10-13 DIAGNOSIS — Z0000 Encounter for general adult medical examination without abnormal findings: Secondary | ICD-10-CM | POA: Diagnosis not present

## 2017-10-13 DIAGNOSIS — M25631 Stiffness of right wrist, not elsewhere classified: Secondary | ICD-10-CM | POA: Diagnosis not present

## 2017-10-13 DIAGNOSIS — M25562 Pain in left knee: Secondary | ICD-10-CM | POA: Diagnosis not present

## 2017-10-13 DIAGNOSIS — C884 Extranodal marginal zone B-cell lymphoma of mucosa-associated lymphoid tissue [MALT-lymphoma]: Secondary | ICD-10-CM | POA: Diagnosis not present

## 2017-10-13 DIAGNOSIS — K222 Esophageal obstruction: Secondary | ICD-10-CM | POA: Diagnosis not present

## 2017-10-13 DIAGNOSIS — J984 Other disorders of lung: Secondary | ICD-10-CM | POA: Diagnosis not present

## 2017-10-13 DIAGNOSIS — M25531 Pain in right wrist: Secondary | ICD-10-CM | POA: Diagnosis not present

## 2017-10-13 DIAGNOSIS — G4709 Other insomnia: Secondary | ICD-10-CM | POA: Diagnosis not present

## 2017-10-13 DIAGNOSIS — I129 Hypertensive chronic kidney disease with stage 1 through stage 4 chronic kidney disease, or unspecified chronic kidney disease: Secondary | ICD-10-CM | POA: Diagnosis not present

## 2017-10-13 DIAGNOSIS — Z23 Encounter for immunization: Secondary | ICD-10-CM | POA: Diagnosis not present

## 2017-10-13 DIAGNOSIS — S52531D Colles' fracture of right radius, subsequent encounter for closed fracture with routine healing: Secondary | ICD-10-CM | POA: Diagnosis not present

## 2017-10-13 DIAGNOSIS — N183 Chronic kidney disease, stage 3 (moderate): Secondary | ICD-10-CM | POA: Diagnosis not present

## 2017-10-13 DIAGNOSIS — Z1389 Encounter for screening for other disorder: Secondary | ICD-10-CM | POA: Diagnosis not present

## 2017-10-13 DIAGNOSIS — Z6820 Body mass index (BMI) 20.0-20.9, adult: Secondary | ICD-10-CM | POA: Diagnosis not present

## 2017-10-14 DIAGNOSIS — M25561 Pain in right knee: Secondary | ICD-10-CM | POA: Diagnosis not present

## 2017-10-14 DIAGNOSIS — S83241D Other tear of medial meniscus, current injury, right knee, subsequent encounter: Secondary | ICD-10-CM | POA: Diagnosis not present

## 2017-10-14 DIAGNOSIS — M84351D Stress fracture, right femur, subsequent encounter for fracture with routine healing: Secondary | ICD-10-CM | POA: Diagnosis not present

## 2017-10-29 DIAGNOSIS — M25631 Stiffness of right wrist, not elsewhere classified: Secondary | ICD-10-CM | POA: Diagnosis not present

## 2017-11-03 DIAGNOSIS — S52531D Colles' fracture of right radius, subsequent encounter for closed fracture with routine healing: Secondary | ICD-10-CM | POA: Diagnosis not present

## 2017-11-03 DIAGNOSIS — M25531 Pain in right wrist: Secondary | ICD-10-CM | POA: Diagnosis not present

## 2017-11-11 DIAGNOSIS — M84351D Stress fracture, right femur, subsequent encounter for fracture with routine healing: Secondary | ICD-10-CM | POA: Diagnosis not present

## 2017-12-01 DIAGNOSIS — N958 Other specified menopausal and perimenopausal disorders: Secondary | ICD-10-CM | POA: Diagnosis not present

## 2017-12-01 DIAGNOSIS — Z1231 Encounter for screening mammogram for malignant neoplasm of breast: Secondary | ICD-10-CM | POA: Diagnosis not present

## 2017-12-01 DIAGNOSIS — Z6821 Body mass index (BMI) 21.0-21.9, adult: Secondary | ICD-10-CM | POA: Diagnosis not present

## 2017-12-01 DIAGNOSIS — Z124 Encounter for screening for malignant neoplasm of cervix: Secondary | ICD-10-CM | POA: Diagnosis not present

## 2017-12-01 DIAGNOSIS — M8588 Other specified disorders of bone density and structure, other site: Secondary | ICD-10-CM | POA: Diagnosis not present

## 2018-01-03 DIAGNOSIS — J181 Lobar pneumonia, unspecified organism: Secondary | ICD-10-CM | POA: Diagnosis not present

## 2018-01-03 DIAGNOSIS — J019 Acute sinusitis, unspecified: Secondary | ICD-10-CM | POA: Diagnosis not present

## 2018-01-22 DIAGNOSIS — M25562 Pain in left knee: Secondary | ICD-10-CM | POA: Diagnosis not present

## 2018-01-22 DIAGNOSIS — S82009A Unspecified fracture of unspecified patella, initial encounter for closed fracture: Secondary | ICD-10-CM | POA: Diagnosis not present

## 2018-01-27 DIAGNOSIS — M25562 Pain in left knee: Secondary | ICD-10-CM | POA: Diagnosis not present

## 2018-02-16 ENCOUNTER — Other Ambulatory Visit: Payer: Self-pay

## 2018-02-16 ENCOUNTER — Encounter (HOSPITAL_COMMUNITY): Payer: Self-pay | Admitting: *Deleted

## 2018-02-16 ENCOUNTER — Emergency Department (HOSPITAL_COMMUNITY)
Admission: EM | Admit: 2018-02-16 | Discharge: 2018-02-16 | Disposition: A | Discharge status: Home / Self Care | Payer: PPO | Attending: Emergency Medicine | Admitting: Emergency Medicine

## 2018-02-16 ENCOUNTER — Emergency Department (HOSPITAL_COMMUNITY): Payer: PPO

## 2018-02-16 DIAGNOSIS — Z79899 Other long term (current) drug therapy: Secondary | ICD-10-CM | POA: Diagnosis not present

## 2018-02-16 DIAGNOSIS — C8590 Non-Hodgkin lymphoma, unspecified, unspecified site: Secondary | ICD-10-CM | POA: Insufficient documentation

## 2018-02-16 DIAGNOSIS — R05 Cough: Secondary | ICD-10-CM | POA: Diagnosis not present

## 2018-02-16 DIAGNOSIS — N182 Chronic kidney disease, stage 2 (mild): Secondary | ICD-10-CM | POA: Insufficient documentation

## 2018-02-16 DIAGNOSIS — Z7722 Contact with and (suspected) exposure to environmental tobacco smoke (acute) (chronic): Secondary | ICD-10-CM | POA: Diagnosis not present

## 2018-02-16 DIAGNOSIS — R0902 Hypoxemia: Secondary | ICD-10-CM | POA: Diagnosis not present

## 2018-02-16 DIAGNOSIS — I959 Hypotension, unspecified: Secondary | ICD-10-CM | POA: Diagnosis not present

## 2018-02-16 DIAGNOSIS — J189 Pneumonia, unspecified organism: Secondary | ICD-10-CM | POA: Diagnosis not present

## 2018-02-16 DIAGNOSIS — R0789 Other chest pain: Secondary | ICD-10-CM | POA: Diagnosis not present

## 2018-02-16 DIAGNOSIS — R079 Chest pain, unspecified: Secondary | ICD-10-CM | POA: Diagnosis not present

## 2018-02-16 LAB — CBC WITH DIFFERENTIAL/PLATELET
ABS IMMATURE GRANULOCYTES: 0.04 10*3/uL (ref 0.00–0.07)
BASOS PCT: 0 %
Basophils Absolute: 0 10*3/uL (ref 0.0–0.1)
Eosinophils Absolute: 0.1 10*3/uL (ref 0.0–0.5)
Eosinophils Relative: 1 %
HCT: 34.5 % — ABNORMAL LOW (ref 36.0–46.0)
Hemoglobin: 11.3 g/dL — ABNORMAL LOW (ref 12.0–15.0)
IMMATURE GRANULOCYTES: 0 %
LYMPHS ABS: 1.4 10*3/uL (ref 0.7–4.0)
LYMPHS PCT: 14 %
MCH: 29.4 pg (ref 26.0–34.0)
MCHC: 32.8 g/dL (ref 30.0–36.0)
MCV: 89.8 fL (ref 80.0–100.0)
MONOS PCT: 10 %
Monocytes Absolute: 1 10*3/uL (ref 0.1–1.0)
NEUTROS ABS: 7.4 10*3/uL (ref 1.7–7.7)
NEUTROS PCT: 75 %
PLATELETS: 146 10*3/uL — AB (ref 150–400)
RBC: 3.84 MIL/uL — ABNORMAL LOW (ref 3.87–5.11)
RDW: 11.7 % (ref 11.5–15.5)
WBC: 9.9 10*3/uL (ref 4.0–10.5)
nRBC: 0 % (ref 0.0–0.2)

## 2018-02-16 LAB — I-STAT TROPONIN, ED
TROPONIN I, POC: 0 ng/mL (ref 0.00–0.08)
TROPONIN I, POC: 0.01 ng/mL (ref 0.00–0.08)

## 2018-02-16 LAB — BASIC METABOLIC PANEL
ANION GAP: 8 (ref 5–15)
BUN: 8 mg/dL (ref 8–23)
CO2: 26 mmol/L (ref 22–32)
Calcium: 8.6 mg/dL — ABNORMAL LOW (ref 8.9–10.3)
Chloride: 97 mmol/L — ABNORMAL LOW (ref 98–111)
Creatinine, Ser: 0.94 mg/dL (ref 0.44–1.00)
GFR calc Af Amer: >60 mL/min (ref >60)
GLUCOSE: 114 mg/dL — AB (ref 70–99)
POTASSIUM: 4.1 mmol/L (ref 3.5–5.1)
Sodium: 131 mmol/L — ABNORMAL LOW (ref 135–145)

## 2018-02-16 MED ORDER — IOPAMIDOL (ISOVUE-370) INJECTION 76%
INTRAVENOUS | Status: AC
  Filled 2018-02-16: qty 100

## 2018-02-16 MED ORDER — IOPAMIDOL (ISOVUE-370) INJECTION 76%
75.0000 mL | Freq: Once | INTRAVENOUS | Status: AC | PRN
  Administered 2018-02-16: 75 mL via INTRAVENOUS

## 2018-02-16 MED ORDER — KETOROLAC TROMETHAMINE 15 MG/ML IJ SOLN
15.0000 mg | Freq: Once | INTRAMUSCULAR | Status: AC
  Administered 2018-02-16: 15 mg via INTRAVENOUS
  Filled 2018-02-16: qty 1

## 2018-02-16 MED ORDER — LEVOFLOXACIN 750 MG PO TABS: 750.0000 mg | ORAL_TABLET | Freq: Every day | ORAL | 0 refills | Status: DC

## 2018-02-16 MED ORDER — LEVOFLOXACIN 500 MG PO TABS
500.0000 mg | ORAL_TABLET | Freq: Once | ORAL | Status: AC
  Administered 2018-02-16: 500 mg via ORAL
  Filled 2018-02-16: qty 1

## 2018-02-16 NOTE — ED Provider Notes (Addendum)
[place] EMERGENCY DEPARTMENT Provider Note   CSN: [id number] Arrival date & time: 02/16/18  5102     History   Chief Complaint Chief Complaint  Patient presents with  . Chest Pain    HPI Shannon Barajas is a 68 y.o. female.  The history is provided by the patient.  Chest Pain  Pain location:  L chest Pain quality: sharp   Pain radiates to:  Does not radiate Pain severity:  Moderate Onset quality:  Sudden Timing:  Constant Progression:  Unchanged Chronicity:  New Context: breathing   Context comment:  Coughing has a dry cough Relieved by:  Nothing Worsened by:  Nothing Ineffective treatments:  None tried Associated symptoms: cough   Associated symptoms: no abdominal pain, no diaphoresis, no fever, no nausea, no palpitations, no shortness of breath and no vomiting   Risk factors: no aortic disease     Past Medical History:  Diagnosis Date  . Chronic interstitial cystitis   . Chronic kidney disease (CKD), stage II (mild) 05/25/2014  . Diverticulosis    mild   . GERD (gastroesophageal reflux disease)   . H/O: iron deficiency anemia   . Hemorrhoids   . Hemorrhoids 07/26/2013  . Hiatal hernia    small   . IBS (irritable bowel syndrome)   . Insomnia, unspecified   . Interstitial cystitis   . 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 Non Hodgkin's lymphoma ([place]) 04/19/2013  . Raynaud's syndrome   . Splenomegaly   . Stricture and stenosis of esophagus   . Thrombocytopenia, unspecified ([place])   . Unspecified constipation     Patient Active Problem List   Diagnosis Date Noted  . Elevated liver enzymes 06/04/2017  . Multiple lung nodules on CT 08/15/2015  . Quality of life palliative care encounter 06/07/2015  . 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 Left breast lump 11/15/2014  . Dysphagia 08/11/2014  . Chronic kidney disease (CKD), stage II (mild) 05/25/2014  . Acute dystonic reaction due to drugs 05/25/2014  .  Bilateral lower extremity edema 05/25/2014  . Chronic kidney disease, stage III (moderate) ([place]) 09/05/2013  . Hemorrhoids 07/26/2013  . Hyponatremia 07/26/2013  . Leg edema 06/23/2013  . 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 Thrombocytopenia ([place]) 10/10/2009  . CONSTIPATION 10/10/2009  . NONSPECIFIC ABN FINDING RAD & OTH EXAM GI TRACT 09/06/2009  . RECTAL BLEEDING 07/10/2009  . HIP PAIN, LEFT 08/28/2008  . SPRAIN&STRAIN OTHER SPECIFIED SITES KNEE&LEG 08/28/2008  . OTHER NONTHROMBOCYTOPENIC PURPURAS 07/27/2008  . WEIGHT LOSS, ABNORMAL 07/27/2008  . DERMATOPHYTOSIS OF NAIL 06/04/2008  . GERD 05/24/2008  . Rectal bleeding 05/24/2008  . HEMORRHOIDS 05/22/2008  . ESOPHAGEAL STRICTURE 05/22/2008  . HIATAL HERNIA 05/22/2008  . PLANTAR WART 08/04/2007  . INSOMNIA, SEVERE 08/04/2007  . RAYNAUD'S SYNDROME 08/30/2006  . IRRITABLE BOWEL SYNDROME 08/30/2006  . DIARRHEA, CHRONIC 08/30/2006    Past Surgical History:  Procedure Laterality Date  . APPENDECTOMY  2012  . APPENDECTOMY  2012  . BALLOON DILATION N/A 09/06/2014   Procedure: BALLOON DILATION;  Surgeon: [name], MD;  Location: [facility];  Service: Endoscopy;  Laterality: N/A;  . BREAST LUMPECTOMY    . ESOPHAGEAL DILATION    . ESOPHAGOGASTRODUODENOSCOPY (EGD) WITH PROPOFOL N/A 09/06/2014   Procedure: ESOPHAGOGASTRODUODENOSCOPY (EGD) WITH PROPOFOL;  Surgeon: [name], MD;  Location: [facility];  Service: Endoscopy;  Laterality: N/A;  . TONSILLECTOMY    . TUBAL LIGATION    .  VIDEO BRONCHOSCOPY Bilateral 08/23/2015   Procedure: VIDEO BRONCHOSCOPY WITH FLUORO;  Surgeon: Javier Glazier, MD;  Location: [facility];  Service: Cardiopulmonary;  Laterality: Bilateral;     OB History   No obstetric history on file.      Home Medications    Prior to Admission medications   Medication Sig Start Date End Date Taking? Authorizing Provider  Biotin 5 MG TABS Take 5 mg by mouth daily.    Yes  [provider]  cholecalciferol (VITAMIN D) 1000 units tablet Take 2,000 Units by mouth daily. Reported on 07/01/2015   Yes [provider]  estradiol (ESTRACE) 1 MG tablet Take 1 mg by mouth daily. 01/27/18  Yes [provider]  Linaclotide Rolan Lipa) 290 MCG CAPS capsule Take 1 capsule (290 mcg total) by mouth every morning. 07/14/13  Yes Ricard Dillon, MD  metoCLOPramide (REGLAN) 5 MG tablet TAKE 1 TABLET BY MOUTH 2 (TWO) TIMES DAILY AT 10 AM AND 4 PM. Patient taking differently: Take 5 mg by mouth 2 (two) times daily.    Yes Ricard Dillon, MD  oseltamivir (TAMIFLU) 75 MG capsule Take 75 mg by mouth daily. 02/07/18  Yes [provider]  progesterone (PROMETRIUM) 100 MG capsule Take 100 mg by mouth at bedtime.  04/27/13  Yes [provider]  temazepam (RESTORIL) 30 MG capsule Take 2 capsules (60 mg total) by mouth at bedtime. 12/18/13  Yes Hoyt Koch, MD  traZODone (DESYREL) 50 MG tablet Take 50 mg by mouth at bedtime.   Yes [provider]  levofloxacin (LEVAQUIN) 750 MG tablet Take 1 tablet (750 mg total) by mouth daily. 02/16/18   Aarthi Uyeno, MD  ranitidine (ZANTAC) 150 MG tablet Take 1 tablet (150 mg total) by mouth 2 (two) times daily. Take 30-60 minutes before breakfast and dinner. Patient not taking: Reported on 02/16/2018 08/19/17   Willia Craze, NP    Family History Family History  Problem Relation Age of Onset  . Lymphoma Father        lymphoma  . Cirrhosis Father        heavy drinker  . Diabetes Maternal Grandmother   . Colon cancer Maternal Grandfather   . Cancer Maternal Grandfather        liver ca ?    Social History Social History   Tobacco Use  . Smoking status: Passive Smoke Exposure - Never Smoker  . Smokeless tobacco: Never Used  . Tobacco comment: Father growing up & daughter currently  Substance Use Topics  . Alcohol use: No  . Drug use: No     Allergies   Biaxin [clarithromycin];  Erythromycin; Soy allergy; Zithromax [azithromycin]; and Zofran [ondansetron hcl]   Review of Systems Review of Systems  Constitutional: Negative for diaphoresis and fever.  Respiratory: Positive for cough. Negative for chest tightness and shortness of breath.   Cardiovascular: Positive for chest pain. Negative for palpitations and leg swelling.  Gastrointestinal: Negative for abdominal pain, nausea and vomiting.  All other systems reviewed and are negative.    Physical Exam Updated Vital Signs BP 97/61 (BP Location: Right Arm)   Pulse 74   Temp 98.6 F (37 C) (Oral)   Resp 18   Ht 5\' 8"  (1.727 m)   Wt 62.6 kg   SpO2 98%   BMI 20.98 kg/m   Physical Exam Vitals signs and nursing note reviewed.  Constitutional:      Appearance: Normal appearance.  HENT:     Head:  Normocephalic and atraumatic.     Nose: Nose normal.     Mouth/Throat:     Mouth: Mucous membranes are moist.     Pharynx: Oropharynx is clear.  Eyes:     Conjunctiva/sclera: Conjunctivae normal.     Pupils: Pupils are equal, round, and reactive to light.  Neck:     Musculoskeletal: Normal range of motion and neck supple.  Cardiovascular:     Rate and Rhythm: Normal rate and regular rhythm.     Pulses: Normal pulses.     Heart sounds: Normal heart sounds.  Pulmonary:     Effort: Pulmonary effort is normal.     Breath sounds: Normal breath sounds. No wheezing, rhonchi or rales.  Chest:     Chest wall: No tenderness.  Abdominal:     General: Abdomen is flat. Bowel sounds are normal.     Tenderness: There is no abdominal tenderness.  Musculoskeletal: Normal range of motion.  Skin:    General: Skin is warm and dry.     Capillary Refill: Capillary refill takes less than 2 seconds.  Neurological:     General: No focal deficit present.     Mental Status: She is alert and oriented to person, place, and time.  Psychiatric:        Mood and Affect: Mood normal.        Behavior: Behavior normal.      ED  Treatments / Results  Labs (all labs ordered are listed, but only abnormal results are displayed) Results for orders placed or performed during the hospital encounter of 02/16/18  CBC with Differential/Platelet  Result Value Ref Range   WBC 9.9 4.0 - 10.5 K/uL   RBC 3.84 (L) 3.87 - 5.11 MIL/uL   Hemoglobin 11.3 (L) 12.0 - 15.0 g/dL   HCT 34.5 (L) 36.0 - 46.0 %   MCV 89.8 80.0 - 100.0 fL   MCH 29.4 26.0 - 34.0 pg   MCHC 32.8 30.0 - 36.0 g/dL   RDW 11.7 11.5 - 15.5 %   Platelets 146 (L) 150 - 400 K/uL   nRBC 0.0 0.0 - 0.2 %   Neutrophils Relative % 75 %   Neutro Abs 7.4 1.7 - 7.7 K/uL   Lymphocytes Relative 14 %   Lymphs Abs 1.4 0.7 - 4.0 K/uL   Monocytes Relative 10 %   Monocytes Absolute 1.0 0.1 - 1.0 K/uL   Eosinophils Relative 1 %   Eosinophils Absolute 0.1 0.0 - 0.5 K/uL   Basophils Relative 0 %   Basophils Absolute 0.0 0.0 - 0.1 K/uL   Immature Granulocytes 0 %   Abs Immature Granulocytes 0.04 0.00 - 0.07 K/uL  Basic metabolic panel  Result Value Ref Range   Sodium 131 (L) 135 - 145 mmol/L   Potassium 4.1 3.5 - 5.1 mmol/L   Chloride 97 (L) 98 - 111 mmol/L   CO2 26 22 - 32 mmol/L   Glucose, Bld 114 (H) 70 - 99 mg/dL   BUN 8 8 - 23 mg/dL   Creatinine, Ser 0.94 0.44 - 1.00 mg/dL   Calcium 8.6 (L) 8.9 - 10.3 mg/dL   GFR calc non Af Amer >60 >60 mL/min   GFR calc Af Amer >60 >60 mL/min   Anion gap 8 5 - 15  I-stat troponin, ED  Result Value Ref Range   Troponin i, poc 0.00 0.00 - 0.08 ng/mL   Comment 3          I-stat troponin, ED  Result Value Ref Range   Troponin i, poc 0.01 0.00 - 0.08 ng/mL   Comment 3           Ct Angio Chest Pe W And/or Wo Contrast  Result Date: 02/16/2018 CLINICAL DATA:  Chest pain and cough. EXAM: CT ANGIOGRAPHY CHEST WITH CONTRAST TECHNIQUE: Multidetector CT imaging of the chest was performed using the standard protocol during bolus administration of intravenous contrast. Multiplanar CT image reconstructions and MIPs were obtained to  evaluate the vascular anatomy. CONTRAST:  23mL ISOVUE-370 IOPAMIDOL (ISOVUE-370) INJECTION 76% COMPARISON:  CT scan dated 06/16/2016 FINDINGS: Cardiovascular: Satisfactory opacification of the pulmonary arteries to the segmental level. No evidence of pulmonary embolism. Normal heart size. No pericardial effusion. Mediastinum/Nodes: No enlarged mediastinal, hilar, or axillary lymph nodes. Thyroid gland and trachea demonstrate no significant abnormalities. New small hiatal hernia. Lungs/Pleura: There is a new focal area of dense consolidation in the lingula measuring approximately 6.2 x 3.8 x 2.3 cm. There is a new small focal area of consolidation peripherally and anteriorly at the right lung base in the right lower lobe measuring 22 x 15 x 11 mm. There is a new tiny left pleural effusion. Upper Abdomen: No discrete lesions in the spleen. Small stable cysts are seen in the dome of the liver. Musculoskeletal: No chest wall abnormality. No acute or significant osseous findings. Review of the MIP images confirms the above findings. IMPRESSION: 1. New focal areas of lung consolidation in the lingula and in the lateral aspect of the right lower lobe consistent with pneumonia. 2. Tiny left pleural effusion. 3. New small hiatal hernia. 4. No findings suggestive of lymphoma. Electronically Signed   By: Lorriane Shire M.D.   On: 02/16/2018 05:55    EKG EKG Interpretation  Date/Time:  Wednesday February 16 2018 03:22:59 EST Ventricular Rate:  75 PR Interval:    QRS Duration: 92 QT Interval:  372 QTC Calculation: 416 R Axis:   64 Text Interpretation:  Sinus rhythm Confirmed by Randal Buba, Arcenio Mullaly ([id number]) on 02/16/2018 6:06:12 AM   Radiology Ct Angio Chest Pe W And/or Wo Contrast  Result Date: 02/16/2018 CLINICAL DATA:  Chest pain and cough. EXAM: CT ANGIOGRAPHY CHEST WITH CONTRAST TECHNIQUE: Multidetector CT imaging of the chest was performed using the standard protocol during bolus administration of intravenous  contrast. Multiplanar CT image reconstructions and MIPs were obtained to evaluate the vascular anatomy. CONTRAST:  33mL ISOVUE-370 IOPAMIDOL (ISOVUE-370) INJECTION 76% COMPARISON:  CT scan dated 06/16/2016 FINDINGS: Cardiovascular: Satisfactory opacification of the pulmonary arteries to the segmental level. No evidence of pulmonary embolism. Normal heart size. No pericardial effusion. Mediastinum/Nodes: No enlarged mediastinal, hilar, or axillary lymph nodes. Thyroid gland and trachea demonstrate no significant abnormalities. New small hiatal hernia. Lungs/Pleura: There is a new focal area of dense consolidation in the lingula measuring approximately 6.2 x 3.8 x 2.3 cm. There is a new small focal area of consolidation peripherally and anteriorly at the right lung base in the right lower lobe measuring 22 x 15 x 11 mm. There is a new tiny left pleural effusion. Upper Abdomen: No discrete lesions in the spleen. Small stable cysts are seen in the dome of the liver. Musculoskeletal: No chest wall abnormality. No acute or significant osseous findings. Review of the MIP images confirms the above findings. IMPRESSION: 1. New focal areas of lung consolidation in the lingula and in the lateral aspect of the right lower lobe consistent with pneumonia. 2. Tiny left pleural effusion. 3. New small hiatal hernia. 4. No  findings suggestive of lymphoma. Electronically Signed   By: Lorriane Shire M.D.   On: 02/16/2018 05:55    Procedures Procedures (including critical care time)  Medications Ordered in ED Medications  iopamidol (ISOVUE-370) 76 % injection (has no administration in time range)  levofloxacin (LEVAQUIN) tablet 500 mg (has no administration in time range)  iopamidol (ISOVUE-370) 76 % injection 75 mL (75 mLs Intravenous Contrast Given 02/16/18 0511)  ketorolac (TORADOL) 15 MG/ML injection 15 mg (15 mg Intravenous Given 02/16/18 0631)     Patient was ruled out for MI and PE in the ED and has a heart score of  one.  The patient has pneumonia on CT and levaquin was chosen as she was on augmentin a month ago.   He creatinine and creatinine clearance are both normal today but I have had a long discussion with the patient that Levaquin can affect kidney function and she should follow up this week with her PMD for recheck and for kidney function check.  She and family are amenable to this plan and agree to follow up.  I have also printed this on the patient's discharge paperwork so she may bring it to her doctor.    Final Clinical Impressions(s) / ED Diagnoses   Final diagnoses:  Community acquired pneumonia of left lung, unspecified part of lung   Patient and family informed that patient will need to be seen by PMD and have recheck of creatinine by PMD, patient also advised to drink copious liquids.  Patient and family verbalize understanding and agree to follow up.   Return for increasedpain, intractable cough, productive cough,fevers >100.4 unrelieved by medication, shortness of breath, intractable vomiting, or diarrhea, abdominal pain, weakness, Inability to tolerate liquids or food, cough, altered mental status or any concerns. No signs of systemic illness or infection. The patient is nontoxic-appearing on exam and vital signs are within normal limits.   I have reviewed the triage vital signs and the nursing notes. Pertinent labs &imaging results that were available during my care of the patient were reviewed by me and considered in my medical decision making (see chart for details).  After history, exam, and medical workup I feel the patient has been appropriately medically screened and is safe for discharge home. Pertinent diagnoses were discussed with the patient. Patient was given return precautions. ED Discharge Orders         Ordered    levofloxacin (LEVAQUIN) 750 MG tablet  Daily     02/16/18 0703           Altonio Schwertner, MD 02/16/18 1610    Randal Buba, Elza Varricchio, MD 02/16/18 9604

## 2018-02-16 NOTE — ED Notes (Signed)
Ambulated to bathroom with assistance  

## 2018-02-16 NOTE — ED Triage Notes (Signed)
Patient presents to ed via [facility] states she was awakened by sharp chest pain , history of pneumonia about 1 month ago c/o lingering cough. States she fractured her patella several weeks ago after a fall and is wearing a knee immobilizer.

## 2018-02-18 ENCOUNTER — Other Ambulatory Visit: Payer: Self-pay

## 2018-02-18 NOTE — Patient Outreach (Signed)
[facility]) Care Management  02/18/2018  Shannon Barajas 05/16/1950 [id number]   Telephone Screen  Referral Date: 02/18/2018 Referral Source: [facility] [facility] Dept. Referral Reason: " co-pay for Linzess-$45.0-would like to see if any resources available to assist" Insurance: [employer]   Outreach attempt #1 to patient. Spoke with patient. She voices that her and her husband have been having several medical issues lately. Patient shares that she was at the hospital in the ED visiting her spouse when she tripped over some wires and fell. She reports that she broke her knee cap and is pretty much confined to the home at this time.Patient herself was in the ED on 02/16/2018 and diagnosed with PNA.  Per chart review, patient has PMH of CKD stage 3, GERD, IBS, Non-Hodgkin's lymphoma, raynaud's syndrome and thrombocytopenia. She reports that she has very strong support system. Her daughter is staying with her during the day. She reports that she has several family and friends who are helping take care of her and her husband. Family is taking her to medical appts as needed. Patient reports that she is taking about 10 meds. She states that her most expensive med is Linzess. She voices that she used to work for [employer] for 31 yrs and while she was on health insurance she was able to use coupon discount card to obtain med. However,since she has retired and been on [employer] she is no longer able to do so and medication is costing her about $45.00. Patient inquiring if there is any assistance/resources available to assist her with cost of med. She denies any issues managing meds. She voices no further [facility] needs or services at this time. Patient provided verbal consent for [facility] pharmacy referral.     Plan: RN CM will send [facility] pharmacy referral for possible med assistance.    Enzo Montgomery, RN,BSN,CCM [place] Management Telephonic Care Management Coordinator Direct Phone: [phone] Toll Free:  [phone] Fax: [phone]

## 2018-02-21 ENCOUNTER — Other Ambulatory Visit: Payer: Self-pay | Admitting: Pharmacist

## 2018-02-21 NOTE — Patient Outreach (Signed)
[facility]) Care Management  [place]   02/21/2018  Shannon Barajas [birth year] [id number]  Reason for referral: Medication Assistance with [place]  Referral source: [employer] Current insurance:[employer]  PMHx includes but not limited to:  Raynaud's syndrome, GERD, hiatal hernia, IBS, constipation, insomnia  Outreach:  Successful telephone call with Shannon Barajas.  HIPAA identifiers verified.   Patient declined reviewing all medications at this time.  She states she only would like to discuss Linzess medication assistance.  She is having difficulty affording the monthly co-pay $45.     Objective: Lab Results  Component Value Date   CREATININE 0.94 02/16/2018   CREATININE 1.10 06/02/2017   CREATININE 1.1 12/03/2016    No results found for: HGBA1C  Lipid Panel     Component Value Date/Time   CHOL 157 05/09/2012 0859   TRIG 61.0 05/09/2012 0859   HDL 62.20 05/09/2012 0859   CHOLHDL 3 05/09/2012 0859   VLDL 12.2 05/09/2012 0859   LDLCALC 83 05/09/2012 0859    BP Readings from Last 3 Encounters:  02/16/18 97/61  09/28/17 121/70  08/18/17 (!) 108/57    Allergies  Allergen Reactions  . Biaxin [Clarithromycin]     REACTION: aggrevates IBS  . Erythromycin     REACTION: aggrevates IBS  . Soy Allergy     May arregevate interstitial cystitis  . Zithromax [Azithromycin]   . Zofran [Ondansetron Hcl]     unknown    Medications Reviewed Today    Reviewed by Veatrice Kells, MD (Physician) on 02/16/18 at [place]  Med List Status: Complete  Medication Order Taking? Sig Documenting Provider Last Dose Status Informant  Biotin 5 MG TABS [id number] Yes Take 5 mg by mouth daily.  [provider] 02/15/2018 Unknown time Active Self  cholecalciferol (VITAMIN D) 1000 units tablet [id number] Yes Take 2,000 Units by mouth daily. Reported on 07/01/2015 [provider] 02/15/2018 Unknown time Active Self  estradiol  (ESTRACE) 1 MG tablet [id number] Yes Take 1 mg by mouth daily. [provider] 02/15/2018 Unknown time Active Self  Linaclotide (LINZESS) 290 MCG CAPS capsule [id number] Yes Take 1 capsule (290 mcg total) by mouth every morning. Ricard Dillon, MD Past Week Unknown time Active Self  metoCLOPramide (REGLAN) 5 MG tablet [id number] Yes TAKE 1 TABLET BY MOUTH 2 (TWO) TIMES DAILY AT 10 AM AND 4 PM.  Patient taking differently:  Take 5 mg by mouth 2 (two) times daily.    Ricard Dillon, MD 02/15/2018 Unknown time Active Self  oseltamivir (TAMIFLU) 75 MG capsule [id number] Yes Take 75 mg by mouth daily. [provider] 02/15/2018 Unknown time Active Self           Med Note Tamala Julian, JEFFREY W   Wed Feb 16, 2018  5:54 AM) Started 02/07/18 for 10 days  progesterone (PROMETRIUM) 100 MG capsule [id number] Yes Take 100 mg by mouth at bedtime.  [provider] 02/15/2018 Unknown time Active Self           Med Note Tamala Julian, JEFFREY W   Wed Feb 16, 2018  5:53 AM)    ranitidine (ZANTAC) 150 MG tablet [id number] No Take 1 tablet (150 mg total) by mouth 2 (two) times daily. Take 30-60 minutes before breakfast and dinner.  Patient not taking:  Reported on 02/16/2018   Willia Craze, NP Not Taking Unknown time Consider Medication Status and Discontinue (Patient Preference) Self  temazepam (RESTORIL) 30 MG capsule [id number] Yes Take  2 capsules (60 mg total) by mouth at bedtime. Hoyt Koch, MD 02/15/2018 Unknown time Active Self  traZODone (DESYREL) 50 MG tablet [id number] Yes Take 50 mg by mouth at bedtime. [provider] 02/15/2018 Unknown time Active Self           Medication Assistance Findings:  Medication assistance needs identified.  : Linzess -Currently paying $45 / month co-pay  Extra Help:   []  Already receiving Full Extra Help  []  Already receiving Partial Extra Help  []  Eligible based on reported income and assets  [x]  Not Eligible based on reported income and  assets  Patient Assistance Programs: 1) Linzess made by [employer] o Not available for [employer], Medicaid, or other state or federal healthcare programs.   Additional medication assistance options reviewed with patient as warranted:  [employer]: Currently closed  [employer]: Currently closed  Patient not eligible for Extra Help or Patient Assistance Programs.  I provided patient with contact information for both [employer] and [employer].  Patient will contact these organizations for more information on when programs may open in 2020 for Linzess.   I reviewed also with patient the benefit of paying for 90 day supply with [employer] as 1 month co-pay is waived, therefore patient will pay $90 / 90 day supply instead of $135.   Plan:  Will close [facility] pharmacy case as no further medication needs identified at this time.  Am happy to assist in the future as needed.  Thank you for allowing [facility] pharmacy to be involved in this patient's care.    Ralene Bathe, PharmD, [place] [phone]

## 2018-02-22 DIAGNOSIS — M25562 Pain in left knee: Secondary | ICD-10-CM | POA: Diagnosis not present

## 2018-02-22 DIAGNOSIS — Z9889 Other specified postprocedural states: Secondary | ICD-10-CM | POA: Diagnosis not present

## 2018-02-28 DIAGNOSIS — M6281 Muscle weakness (generalized): Secondary | ICD-10-CM | POA: Diagnosis not present

## 2018-02-28 DIAGNOSIS — S82035D Nondisplaced transverse fracture of left patella, subsequent encounter for closed fracture with routine healing: Secondary | ICD-10-CM | POA: Diagnosis not present

## 2018-03-02 DIAGNOSIS — S82035D Nondisplaced transverse fracture of left patella, subsequent encounter for closed fracture with routine healing: Secondary | ICD-10-CM | POA: Diagnosis not present

## 2018-03-02 DIAGNOSIS — M6281 Muscle weakness (generalized): Secondary | ICD-10-CM | POA: Diagnosis not present

## 2018-03-07 DIAGNOSIS — M6281 Muscle weakness (generalized): Secondary | ICD-10-CM | POA: Diagnosis not present

## 2018-03-07 DIAGNOSIS — S82035D Nondisplaced transverse fracture of left patella, subsequent encounter for closed fracture with routine healing: Secondary | ICD-10-CM | POA: Diagnosis not present

## 2018-03-09 DIAGNOSIS — M6281 Muscle weakness (generalized): Secondary | ICD-10-CM | POA: Diagnosis not present

## 2018-03-09 DIAGNOSIS — S82035D Nondisplaced transverse fracture of left patella, subsequent encounter for closed fracture with routine healing: Secondary | ICD-10-CM | POA: Diagnosis not present

## 2018-03-15 DIAGNOSIS — M6281 Muscle weakness (generalized): Secondary | ICD-10-CM | POA: Diagnosis not present

## 2018-03-15 DIAGNOSIS — S82035D Nondisplaced transverse fracture of left patella, subsequent encounter for closed fracture with routine healing: Secondary | ICD-10-CM | POA: Diagnosis not present

## 2018-03-17 DIAGNOSIS — S82035D Nondisplaced transverse fracture of left patella, subsequent encounter for closed fracture with routine healing: Secondary | ICD-10-CM | POA: Diagnosis not present

## 2018-03-17 DIAGNOSIS — M6281 Muscle weakness (generalized): Secondary | ICD-10-CM | POA: Diagnosis not present

## 2018-03-22 DIAGNOSIS — S82035D Nondisplaced transverse fracture of left patella, subsequent encounter for closed fracture with routine healing: Secondary | ICD-10-CM | POA: Diagnosis not present

## 2018-03-22 DIAGNOSIS — M6281 Muscle weakness (generalized): Secondary | ICD-10-CM | POA: Diagnosis not present

## 2018-03-28 DIAGNOSIS — M6281 Muscle weakness (generalized): Secondary | ICD-10-CM | POA: Diagnosis not present

## 2018-03-28 DIAGNOSIS — S82035D Nondisplaced transverse fracture of left patella, subsequent encounter for closed fracture with routine healing: Secondary | ICD-10-CM | POA: Diagnosis not present

## 2018-03-30 DIAGNOSIS — S82035D Nondisplaced transverse fracture of left patella, subsequent encounter for closed fracture with routine healing: Secondary | ICD-10-CM | POA: Diagnosis not present

## 2018-03-30 DIAGNOSIS — M6281 Muscle weakness (generalized): Secondary | ICD-10-CM | POA: Diagnosis not present

## 2018-04-29 ENCOUNTER — Telehealth: Payer: Self-pay | Admitting: Hematology and Oncology

## 2018-04-29 NOTE — Telephone Encounter (Signed)
Returned patient's phone call about rescheduling an appointment, per patient's request appt has been moved from May to July.  Message to provider.

## 2018-06-02 ENCOUNTER — Other Ambulatory Visit: Payer: PPO

## 2018-06-02 ENCOUNTER — Ambulatory Visit: Payer: PPO | Admitting: Hematology and Oncology

## 2018-06-28 DIAGNOSIS — D3132 Benign neoplasm of left choroid: Secondary | ICD-10-CM | POA: Diagnosis not present

## 2018-06-28 DIAGNOSIS — H43813 Vitreous degeneration, bilateral: Secondary | ICD-10-CM | POA: Diagnosis not present

## 2018-06-28 DIAGNOSIS — H04123 Dry eye syndrome of bilateral lacrimal glands: Secondary | ICD-10-CM | POA: Diagnosis not present

## 2018-06-28 DIAGNOSIS — H2513 Age-related nuclear cataract, bilateral: Secondary | ICD-10-CM | POA: Diagnosis not present

## 2018-07-28 ENCOUNTER — Other Ambulatory Visit: Payer: Self-pay | Admitting: Hematology and Oncology

## 2018-07-28 DIAGNOSIS — C8307 Small cell B-cell lymphoma, spleen: Secondary | ICD-10-CM

## 2018-07-29 ENCOUNTER — Encounter: Payer: Self-pay | Admitting: Hematology and Oncology

## 2018-07-29 ENCOUNTER — Inpatient Hospital Stay: Payer: PPO | Admitting: Hematology and Oncology

## 2018-07-29 ENCOUNTER — Inpatient Hospital Stay: Payer: PPO | Attending: Internal Medicine

## 2018-09-30 ENCOUNTER — Encounter: Payer: Self-pay | Admitting: Hematology and Oncology

## 2018-09-30 ENCOUNTER — Other Ambulatory Visit: Payer: Self-pay

## 2018-09-30 ENCOUNTER — Inpatient Hospital Stay (HOSPITAL_BASED_OUTPATIENT_CLINIC_OR_DEPARTMENT_OTHER): Payer: PPO | Admitting: Hematology and Oncology

## 2018-09-30 ENCOUNTER — Inpatient Hospital Stay: Payer: PPO | Attending: Internal Medicine

## 2018-09-30 VITALS — BP 111/50 | HR 78 | Temp 98.3°F | Resp 18 | Ht 68.0 in | Wt 146.6 lb

## 2018-09-30 DIAGNOSIS — Z0000 Encounter for general adult medical examination without abnormal findings: Secondary | ICD-10-CM

## 2018-09-30 DIAGNOSIS — N183 Chronic kidney disease, stage 3 unspecified: Secondary | ICD-10-CM

## 2018-09-30 DIAGNOSIS — C8307 Small cell B-cell lymphoma, spleen: Secondary | ICD-10-CM

## 2018-09-30 DIAGNOSIS — R748 Abnormal levels of other serum enzymes: Secondary | ICD-10-CM | POA: Diagnosis not present

## 2018-09-30 DIAGNOSIS — D696 Thrombocytopenia, unspecified: Secondary | ICD-10-CM | POA: Insufficient documentation

## 2018-09-30 DIAGNOSIS — Z23 Encounter for immunization: Secondary | ICD-10-CM | POA: Insufficient documentation

## 2018-09-30 DIAGNOSIS — Z8572 Personal history of non-Hodgkin lymphomas: Secondary | ICD-10-CM | POA: Diagnosis not present

## 2018-09-30 LAB — CBC WITH DIFFERENTIAL/PLATELET
Abs Immature Granulocytes: 0.01 10*3/uL (ref 0.00–0.07)
Basophils Absolute: 0 10*3/uL (ref 0.0–0.1)
Basophils Relative: 1 %
Eosinophils Absolute: 0.1 10*3/uL (ref 0.0–0.5)
Eosinophils Relative: 2 %
HCT: 40.6 % (ref 36.0–46.0)
Hemoglobin: 13.7 g/dL (ref 12.0–15.0)
Immature Granulocytes: 0 %
Lymphocytes Relative: 28 %
Lymphs Abs: 1.8 10*3/uL (ref 0.7–4.0)
MCH: 29.8 pg (ref 26.0–34.0)
MCHC: 33.7 g/dL (ref 30.0–36.0)
MCV: 88.5 fL (ref 80.0–100.0)
Monocytes Absolute: 0.5 10*3/uL (ref 0.1–1.0)
Monocytes Relative: 7 %
Neutro Abs: 4 10*3/uL (ref 1.7–7.7)
Neutrophils Relative %: 62 %
Platelets: 126 10*3/uL — ABNORMAL LOW (ref 150–400)
RBC: 4.59 MIL/uL (ref 3.87–5.11)
RDW: 12.1 % (ref 11.5–15.5)
WBC: 6.4 10*3/uL (ref 4.0–10.5)
nRBC: 0 % (ref 0.0–0.2)

## 2018-09-30 LAB — COMPREHENSIVE METABOLIC PANEL
ALT: 9 U/L (ref 0–44)
AST: 18 U/L (ref 15–41)
Albumin: 4.5 g/dL (ref 3.5–5.0)
Alkaline Phosphatase: 160 U/L — ABNORMAL HIGH (ref 38–126)
Anion gap: 8 (ref 5–15)
BUN: 15 mg/dL (ref 8–23)
CO2: 26 mmol/L (ref 22–32)
Calcium: 9.5 mg/dL (ref 8.9–10.3)
Chloride: 98 mmol/L (ref 98–111)
Creatinine, Ser: 1.08 mg/dL — ABNORMAL HIGH (ref 0.44–1.00)
GFR calc Af Amer: >60 mL/min (ref >60)
GFR calc non Af Amer: 52 mL/min — ABNORMAL LOW (ref >60)
Glucose, Bld: 78 mg/dL (ref 70–99)
Potassium: 4.3 mmol/L (ref 3.5–5.1)
Sodium: 132 mmol/L — ABNORMAL LOW (ref 135–145)
Total Bilirubin: 0.6 mg/dL (ref 0.3–1.2)
Total Protein: 6.3 g/dL — ABNORMAL LOW (ref 6.5–8.1)

## 2018-09-30 LAB — LACTATE DEHYDROGENASE: LDH: 163 U/L (ref 98–192)

## 2018-09-30 MED ORDER — INFLUENZA VAC A&B SA ADJ QUAD 0.5 ML IM PRSY
PREFILLED_SYRINGE | INTRAMUSCULAR | Status: AC
  Filled 2018-09-30: qty 0.5

## 2018-09-30 MED ORDER — INFLUENZA VAC A&B SA ADJ QUAD 0.5 ML IM PRSY
0.5000 mL | PREFILLED_SYRINGE | Freq: Once | INTRAMUSCULAR | Status: AC
  Administered 2018-09-30: 0.5 mL via INTRAMUSCULAR

## 2018-09-30 NOTE — Assessment & Plan Note (Signed)
She has chronic intermittent elevated liver enzymes before, likely related to her medication Mildly elevated alkaline phosphatase could also be related to recent leg fracture Observe only for now

## 2018-09-30 NOTE — Assessment & Plan Note (Signed)
She has mild, intermittent elevated creatinine. She feels well and will continue to follow with nephrologist for medical management. 

## 2018-09-30 NOTE — Assessment & Plan Note (Signed)
Clinically, she have no signs of cancer recurrence The patient is almost 4 years out from prior treatment I have discontinued surveillance imaging I plan to see her back once a year with history, physical examination and blood work The patient is educated to watch out for signs and symptoms of cancer recurrence We discussed the importance of preventive care and reviewed the vaccination programs. She does not have any prior allergic reactions to influenza vaccination. She agrees to proceed with influenza vaccination today and we will administer it today at the clinic.

## 2018-09-30 NOTE — Assessment & Plan Note (Signed)
The cause is unknown but could be due to prior treatment. The patient denies recent history of bleeding such as epistaxis, hematuria or hematochezia up from intermittent hemorrhoidal bleeding. She is asymptomatic from the thrombocytopenia. I will observe for now.   

## 2018-09-30 NOTE — Progress Notes (Signed)
[place] OFFICE PROGRESS NOTE  Patient Care Team: Velna Hatchet, MD as PCP - General (Internal Medicine) Fleet Contras, MD as Consulting Physician (Nephrology)  ASSESSMENT & PLAN:  Splenic marginal zone b-cell lymphoma ([place]) Clinically, she have no signs of cancer recurrence The patient is almost 4 years out from prior treatment I have discontinued surveillance imaging I plan to see her back once a year with history, physical examination and blood work The patient is educated to watch out for signs and symptoms of cancer recurrence We discussed the importance of preventive care and reviewed the vaccination programs. She does not have any prior allergic reactions to influenza vaccination. She agrees to proceed with influenza vaccination today and we will administer it today at the clinic.   Thrombocytopenia The cause is unknown but could be due to prior treatment. The patient denies recent history of bleeding such as epistaxis, hematuria or hematochezia up from intermittent hemorrhoidal bleeding. She is asymptomatic from the thrombocytopenia. I will observe for now.    Chronic kidney disease, stage III (moderate) She has mild, intermittent elevated creatinine. She feels well and will continue to follow with nephrologist for medical management.  Elevated liver enzymes She has chronic intermittent elevated liver enzymes before, likely related to her medication Mildly elevated alkaline phosphatase could also be related to recent leg fracture Observe only for now   No orders of the defined types were placed in this encounter.   INTERVAL HISTORY: Please see below for problem oriented charting. She returns for further follow-up The patient had significant health issues not long ago due to recent fall She was in a cast for a long time She fell when she tried to get her husband evaluated for GI bleed Her husband had massive transfusion due to variceal bleed but  is doing well currently She continues to take care of her elderly mother with dementia She denies palpable lymphadenopathy No recent infection, fever or chills Denies abdominal discomfort  SUMMARY OF ONCOLOGIC HISTORY: Oncology History Overview Note  Non-Hodgkin lymphoma, marginal zone lymphoma   Primary site: Lymphoid Neoplasms   Staging method: AJCC 6th Edition   Clinical: Stage IV signed by Heath Lark, MD on 05/30/2013  8:47 AM   Summary: Stage IV   Splenic marginal zone b-cell lymphoma ([place])  02/23/2013 Imaging   CT scan show significant enlargement of liver and spleen   05/05/2013 Bone Marrow Biopsy   Bone marrow aspirate and biopsy confirmed low grade lymphoma, suspect marginal zone lymphoma   05/08/2013 Imaging   PET scan show involvement of liver and spleen   06/02/2013 - 06/23/2013 Chemotherapy   She was started on weekly rituximab.   09/04/2013 Imaging   PET CT scan show complete response to treatment with splenic size reduced back to normal limits. She has persistent mild thrombocytopenia.   12/06/2013 Imaging   PET scan was negative and spleen size is normal   06/06/2014 Imaging   PEt scan is negative   06/05/2015 Imaging   No findings to suggest residual/recurrent splenic lymphoma   06/16/2016 Imaging   1. No enlarged lymph nodes or mass within the chest abdomen or pelvis to suggest residual/recurrent lymphoma. 2. Small pericardial effusion. Unchanged 3. Scar like densities noted within the right middle lobe status post pneumonia.   06/02/2017 Imaging   1. Stable CT of the abdomen and pelvis. No mass or adenopathy to suggest residual/recurrent lymphoma. 2. Similar small pericardial effusion.     REVIEW OF SYSTEMS:   Constitutional:  Denies fevers, chills or abnormal weight loss Eyes: Denies blurriness of vision Ears, nose, mouth, throat, and face: Denies mucositis or sore throat Respiratory: Denies cough, dyspnea or wheezes Cardiovascular: Denies palpitation,  chest discomfort or lower extremity swelling Gastrointestinal:  Denies nausea, heartburn or change in bowel habits Skin: Denies abnormal skin rashes Lymphatics: Denies new lymphadenopathy or easy bruising Neurological:Denies numbness, tingling or new weaknesses Behavioral/Psych: Mood is stable, no new changes  All other systems were reviewed with the patient and are negative.  I have reviewed the past medical history, past surgical history, social history and family history with the patient and they are unchanged from previous note.  ALLERGIES:  is allergic to biaxin [clarithromycin]; erythromycin; soy allergy; zithromax [azithromycin]; and zofran [ondansetron hcl].  MEDICATIONS:  Current Outpatient Medications  Medication Sig Dispense Refill  . cholecalciferol (VITAMIN D) 1000 units tablet Take 2,000 Units by mouth daily. Reported on 07/01/2015    . estradiol (ESTRACE) 1 MG tablet Take 1 mg by mouth daily.    . Linaclotide (LINZESS) 290 MCG CAPS capsule Take 1 capsule (290 mcg total) by mouth every morning. 30 capsule 5  . metoCLOPramide (REGLAN) 5 MG tablet TAKE 1 TABLET BY MOUTH 2 (TWO) TIMES DAILY AT 10 AM AND 4 PM. (Patient taking differently: Take 5 mg by mouth 2 (two) times daily. ) 180 tablet 1  . progesterone (PROMETRIUM) 100 MG capsule Take 100 mg by mouth at bedtime.     . temazepam (RESTORIL) 30 MG capsule Take 2 capsules (60 mg total) by mouth at bedtime. 60 capsule 1  . traZODone (DESYREL) 50 MG tablet Take 50 mg by mouth at bedtime.     No current facility-administered medications for this visit.     PHYSICAL EXAMINATION: ECOG PERFORMANCE STATUS: 1 - Symptomatic but completely ambulatory  Vitals:   09/30/18 1339  BP: (!) 111/50  Pulse: 78  Resp: 18  Temp: 98.3 F (36.8 C)  SpO2: 98%   Filed Weights   09/30/18 1339  Weight: 146 lb 9.6 oz (66.5 kg)    GENERAL:alert, no distress and comfortable SKIN: skin color, texture, turgor are normal, no rashes or  significant lesions EYES: normal, Conjunctiva are pink and non-injected, sclera clear OROPHARYNX:no exudate, no erythema and lips, buccal mucosa, and tongue normal  NECK: supple, thyroid normal size, non-tender, without nodularity LYMPH:  no palpable lymphadenopathy in the cervical, axillary or inguinal LUNGS: clear to auscultation and percussion with normal breathing effort HEART: regular rate & rhythm and no murmurs and no lower extremity edema ABDOMEN:abdomen soft, non-tender and normal bowel sounds Musculoskeletal:no cyanosis of digits and no clubbing  NEURO: alert & oriented x 3 with fluent speech, no focal motor/sensory deficits  LABORATORY DATA:  I have reviewed the data as listed    Component Value Date/Time   NA 132 (L) 09/30/2018 1313   NA 128 (L) 12/03/2016 1445   K 4.3 09/30/2018 1313   K 4.2 12/03/2016 1445   CL 98 09/30/2018 1313   CO2 26 09/30/2018 1313   CO2 27 12/03/2016 1445   GLUCOSE 78 09/30/2018 1313   GLUCOSE 96 12/03/2016 1445   BUN 15 09/30/2018 1313   BUN 11.4 12/03/2016 1445   CREATININE 1.08 (H) 09/30/2018 1313   CREATININE 1.1 12/03/2016 1445   CALCIUM 9.5 09/30/2018 1313   CALCIUM 9.4 12/03/2016 1445   PROT 6.3 (L) 09/30/2018 1313   PROT 6.3 (L) 12/03/2016 1445   ALBUMIN 4.5 09/30/2018 1313   ALBUMIN 4.3 12/03/2016 1445  AST 18 09/30/2018 1313   AST 17 12/03/2016 1445   ALT 9 09/30/2018 1313   ALT 12 12/03/2016 1445   ALKPHOS 160 (H) 09/30/2018 1313   ALKPHOS 126 12/03/2016 1445   BILITOT 0.6 09/30/2018 1313   BILITOT 0.50 12/03/2016 1445   GFRNONAA 52 (L) 09/30/2018 1313   GFRAA >60 09/30/2018 1313    No results found for: SPEP, UPEP  Lab Results  Component Value Date   WBC 6.4 09/30/2018   NEUTROABS 4.0 09/30/2018   HGB 13.7 09/30/2018   HCT 40.6 09/30/2018   MCV 88.5 09/30/2018   PLT 126 (L) 09/30/2018      Chemistry      Component Value Date/Time   NA 132 (L) 09/30/2018 1313   NA 128 (L) 12/03/2016 1445   K 4.3  09/30/2018 1313   K 4.2 12/03/2016 1445   CL 98 09/30/2018 1313   CO2 26 09/30/2018 1313   CO2 27 12/03/2016 1445   BUN 15 09/30/2018 1313   BUN 11.4 12/03/2016 1445   CREATININE 1.08 (H) 09/30/2018 1313   CREATININE 1.1 12/03/2016 1445      Component Value Date/Time   CALCIUM 9.5 09/30/2018 1313   CALCIUM 9.4 12/03/2016 1445   ALKPHOS 160 (H) 09/30/2018 1313   ALKPHOS 126 12/03/2016 1445   AST 18 09/30/2018 1313   AST 17 12/03/2016 1445   ALT 9 09/30/2018 1313   ALT 12 12/03/2016 1445   BILITOT 0.6 09/30/2018 1313   BILITOT 0.50 12/03/2016 1445     All questions were answered. The patient knows to call the clinic with any problems, questions or concerns. No barriers to learning was detected.  I spent 15 minutes counseling the patient face to face. The total time spent in the appointment was 20 minutes and more than 50% was on counseling and review of test results  Heath Lark, MD 09/30/2018 4:49 PM

## 2018-10-03 ENCOUNTER — Telehealth: Payer: Self-pay | Admitting: Hematology and Oncology

## 2018-10-03 NOTE — Telephone Encounter (Signed)
I talk with patient regarding schedule  

## 2018-10-12 DIAGNOSIS — R7989 Other specified abnormal findings of blood chemistry: Secondary | ICD-10-CM | POA: Diagnosis not present

## 2018-10-12 DIAGNOSIS — M859 Disorder of bone density and structure, unspecified: Secondary | ICD-10-CM | POA: Diagnosis not present

## 2018-10-12 DIAGNOSIS — R946 Abnormal results of thyroid function studies: Secondary | ICD-10-CM | POA: Diagnosis not present

## 2018-10-19 DIAGNOSIS — N183 Chronic kidney disease, stage 3 (moderate): Secondary | ICD-10-CM | POA: Diagnosis not present

## 2018-10-19 DIAGNOSIS — K222 Esophageal obstruction: Secondary | ICD-10-CM | POA: Diagnosis not present

## 2018-10-19 DIAGNOSIS — Z1331 Encounter for screening for depression: Secondary | ICD-10-CM | POA: Diagnosis not present

## 2018-10-19 DIAGNOSIS — N3010 Interstitial cystitis (chronic) without hematuria: Secondary | ICD-10-CM | POA: Diagnosis not present

## 2018-10-19 DIAGNOSIS — K589 Irritable bowel syndrome without diarrhea: Secondary | ICD-10-CM | POA: Diagnosis not present

## 2018-10-19 DIAGNOSIS — G4700 Insomnia, unspecified: Secondary | ICD-10-CM | POA: Diagnosis not present

## 2018-10-19 DIAGNOSIS — M8580 Other specified disorders of bone density and structure, unspecified site: Secondary | ICD-10-CM | POA: Diagnosis not present

## 2018-10-19 DIAGNOSIS — Z0000 Encounter for general adult medical examination without abnormal findings: Secondary | ICD-10-CM | POA: Diagnosis not present

## 2018-10-19 DIAGNOSIS — C884 Extranodal marginal zone B-cell lymphoma of mucosa-associated lymphoid tissue [MALT-lymphoma]: Secondary | ICD-10-CM | POA: Diagnosis not present

## 2018-10-19 DIAGNOSIS — Z658 Other specified problems related to psychosocial circumstances: Secondary | ICD-10-CM | POA: Diagnosis not present

## 2018-10-19 DIAGNOSIS — J984 Other disorders of lung: Secondary | ICD-10-CM | POA: Diagnosis not present

## 2018-10-31 DIAGNOSIS — Z1212 Encounter for screening for malignant neoplasm of rectum: Secondary | ICD-10-CM | POA: Diagnosis not present

## 2018-11-14 ENCOUNTER — Telehealth: Payer: Self-pay | Admitting: *Deleted

## 2018-11-14 NOTE — Telephone Encounter (Signed)
"  Shannon Barajas [phone]).  Stage IV NHL with enlarged liver and elevated enzymes since 2015.  I need Dr. Alvy Bimler to take a look at me.  Pain started middle last week, stomach enlarged looks like a basketball, almost can't wear clothes. Hydrocodone one night.  Tylenol yesterday.  Linzess for irritable bowel yesterday usually makes everything okay did not.  This is a change from my normal.   No fever when checked at dental office today.   No swollen lymph nodes.   No cough, pain in chest or trouble breathing. I did have reflux or regurgitation today."  Aware this nurse sharing call information.  Advised to go to ED if needed or symptom progression.   "No, I do not think I need to go to the ED or think they will do anything for me."

## 2018-11-15 ENCOUNTER — Inpatient Hospital Stay: Payer: PPO | Attending: Internal Medicine | Admitting: Hematology and Oncology

## 2018-11-15 ENCOUNTER — Ambulatory Visit (HOSPITAL_COMMUNITY)
Admission: RE | Admit: 2018-11-15 | Discharge: 2018-11-15 | Disposition: A | Discharge status: Home / Self Care | Payer: PPO | Source: Ambulatory Visit | Attending: Hematology and Oncology | Admitting: Hematology and Oncology

## 2018-11-15 ENCOUNTER — Other Ambulatory Visit: Payer: Self-pay

## 2018-11-15 ENCOUNTER — Encounter: Payer: Self-pay | Admitting: Hematology and Oncology

## 2018-11-15 ENCOUNTER — Inpatient Hospital Stay: Payer: PPO

## 2018-11-15 ENCOUNTER — Other Ambulatory Visit: Payer: Self-pay | Admitting: Hematology and Oncology

## 2018-11-15 VITALS — BP 128/72 | HR 70 | Resp 18 | Ht 68.0 in | Wt 144.1 lb

## 2018-11-15 DIAGNOSIS — R300 Dysuria: Secondary | ICD-10-CM

## 2018-11-15 DIAGNOSIS — D696 Thrombocytopenia, unspecified: Secondary | ICD-10-CM | POA: Diagnosis not present

## 2018-11-15 DIAGNOSIS — Z8572 Personal history of non-Hodgkin lymphomas: Secondary | ICD-10-CM | POA: Diagnosis not present

## 2018-11-15 DIAGNOSIS — R1084 Generalized abdominal pain: Secondary | ICD-10-CM | POA: Insufficient documentation

## 2018-11-15 DIAGNOSIS — R9389 Abnormal findings on diagnostic imaging of other specified body structures: Secondary | ICD-10-CM | POA: Insufficient documentation

## 2018-11-15 DIAGNOSIS — C8590 Non-Hodgkin lymphoma, unspecified, unspecified site: Secondary | ICD-10-CM | POA: Diagnosis not present

## 2018-11-15 DIAGNOSIS — C8307 Small cell B-cell lymphoma, spleen: Secondary | ICD-10-CM | POA: Insufficient documentation

## 2018-11-15 DIAGNOSIS — R140 Abdominal distension (gaseous): Secondary | ICD-10-CM | POA: Diagnosis not present

## 2018-11-15 LAB — CBC WITH DIFFERENTIAL/PLATELET
Abs Immature Granulocytes: 0.01 10*3/uL (ref 0.00–0.07)
Basophils Absolute: 0 10*3/uL (ref 0.0–0.1)
Basophils Relative: 0 %
Eosinophils Absolute: 0.2 10*3/uL (ref 0.0–0.5)
Eosinophils Relative: 3 %
HCT: 37.3 % (ref 36.0–46.0)
Hemoglobin: 12.6 g/dL (ref 12.0–15.0)
Immature Granulocytes: 0 %
Lymphocytes Relative: 31 %
Lymphs Abs: 1.6 10*3/uL (ref 0.7–4.0)
MCH: 29.9 pg (ref 26.0–34.0)
MCHC: 33.8 g/dL (ref 30.0–36.0)
MCV: 88.4 fL (ref 80.0–100.0)
Monocytes Absolute: 0.5 10*3/uL (ref 0.1–1.0)
Monocytes Relative: 9 %
Neutro Abs: 3 10*3/uL (ref 1.7–7.7)
Neutrophils Relative %: 57 %
Platelets: 125 10*3/uL — ABNORMAL LOW (ref 150–400)
RBC: 4.22 MIL/uL (ref 3.87–5.11)
RDW: 12 % (ref 11.5–15.5)
WBC: 5.3 10*3/uL (ref 4.0–10.5)
nRBC: 0 % (ref 0.0–0.2)

## 2018-11-15 LAB — URINALYSIS, COMPLETE (UACMP) WITH MICROSCOPIC
Bilirubin Urine: NEGATIVE
Glucose, UA: NEGATIVE mg/dL
Ketones, ur: NEGATIVE mg/dL
Nitrite: NEGATIVE
Protein, ur: NEGATIVE mg/dL
Specific Gravity, Urine: 1.008 (ref 1.005–1.030)
pH: 6 (ref 5.0–8.0)

## 2018-11-15 LAB — COMPREHENSIVE METABOLIC PANEL
ALT: 9 U/L (ref 0–44)
AST: 13 U/L — ABNORMAL LOW (ref 15–41)
Albumin: 3.7 g/dL (ref 3.5–5.0)
Alkaline Phosphatase: 109 U/L (ref 38–126)
Anion gap: 11 (ref 5–15)
BUN: 11 mg/dL (ref 8–23)
CO2: 24 mmol/L (ref 22–32)
Calcium: 9.3 mg/dL (ref 8.9–10.3)
Chloride: 96 mmol/L — ABNORMAL LOW (ref 98–111)
Creatinine, Ser: 0.91 mg/dL (ref 0.44–1.00)
GFR calc Af Amer: >60 mL/min (ref >60)
GFR calc non Af Amer: >60 mL/min (ref >60)
Glucose, Bld: 75 mg/dL (ref 70–99)
Potassium: 3.9 mmol/L (ref 3.5–5.1)
Sodium: 131 mmol/L — ABNORMAL LOW (ref 135–145)
Total Bilirubin: 0.4 mg/dL (ref 0.3–1.2)
Total Protein: 6 g/dL — ABNORMAL LOW (ref 6.5–8.1)

## 2018-11-15 LAB — LACTATE DEHYDROGENASE: LDH: 197 U/L — ABNORMAL HIGH (ref 98–192)

## 2018-11-15 NOTE — Telephone Encounter (Signed)
I have ordered labs and X ray Please advise her to go to [facility] radiology around 1230 pm, then come here to check in for labs and then I can see her at 145 pm today

## 2018-11-15 NOTE — Assessment & Plan Note (Signed)
Even though, her blood work appears somewhat benign, she have severe generalized abdominal pain and bloating that cannot be explained by her blood work or x-ray She is at high risk of cancer recurrence I recommend CT imaging with contrast for further evaluation as soon as possible and should agree with the plan of care I will try to get that done this week and if approved, I will see her the next day to review test results

## 2018-11-15 NOTE — Assessment & Plan Note (Signed)
The cause is unknown but could be due to prior treatment. The patient denies recent history of bleeding such as epistaxis, hematuria or hematochezia up from intermittent hemorrhoidal bleeding. She is asymptomatic from the thrombocytopenia. I will observe for now.   

## 2018-11-15 NOTE — Progress Notes (Signed)
[place] OFFICE PROGRESS NOTE  Patient Care Team: Velna Hatchet, MD as PCP - General (Internal Medicine) Fleet Contras, MD as Consulting Physician (Nephrology)  ASSESSMENT & PLAN:  Splenic marginal zone b-cell lymphoma ([place]) Even though, her blood work appears somewhat benign, she have severe generalized abdominal pain and bloating that cannot be explained by her blood work or x-ray She is at high risk of cancer recurrence I recommend CT imaging with contrast for further evaluation as soon as possible and should agree with the plan of care I will try to get that done this week and if approved, I will see her the next day to review test results  Dysuria She has some mild dysuria Urinalysis is borderline abnormal I will order a urine culture to exclude urinary tract infection  Thrombocytopenia The cause is unknown but could be due to prior treatment. The patient denies recent history of bleeding such as epistaxis, hematuria or hematochezia up from intermittent hemorrhoidal bleeding. She is asymptomatic from the thrombocytopenia. I will observe for now.    Generalized abdominal pain She has diffuse generalized abdominal pain with mild tenderness on exam throughout This is nonspecific Diverticulitis cannot be excluded Ischemic colitis cannot be excluded Recurrence of cancer cannot be excluded She will continue to take pain medicine as needed I will order CT imaging for further evaluation as the x-ray of the abdomen was not revealing She agreed with the plan of care   Orders Placed This Encounter  Procedures  . CT ABDOMEN PELVIS W CONTRAST    Standing Status:   Future    Standing Expiration Date:   11/15/2019    Order Specific Question:   If indicated for the ordered procedure, I authorize the administration of contrast media per Radiology protocol    Answer:   Yes    Order Specific Question:   Preferred imaging location?    Answer:   [facility]     Order Specific Question:   Radiology Contrast Protocol - do NOT remove file path    Answer:   [email or web address]    INTERVAL HISTORY: Please see below for problem oriented charting. She is seen urgently for further evaluation due to abdominal bloating and new onset of abdominal pain I just saw her over a month ago Since then, she started to feel sensation of bloating and discomfort She have history of irritable bowel syndrome and takes Linzess on a regular basis She has loose stool on a regular basis but this is not abnormal for her She also have history of urinary discomfort/cystitis which is chronic in nature but she denies changes  She has some mild nausea Her appetite remains stable She felt more bloated than usual Denies abnormal vaginal bleeding She took hydrocodone for abdominal pain recently to control her symptoms Tylenol was not helpful She denies new lymphadenopathy  SUMMARY OF ONCOLOGIC HISTORY: Oncology History Overview Note  Non-Hodgkin lymphoma, marginal zone lymphoma   Primary site: Lymphoid Neoplasms   Staging method: AJCC 6th Edition   Clinical: Stage IV signed by Heath Lark, MD on 05/30/2013  8:47 AM   Summary: Stage IV   Splenic marginal zone b-cell lymphoma ([place])  02/23/2013 Imaging   CT scan show significant enlargement of liver and spleen   05/05/2013 Bone Marrow Biopsy   Bone marrow aspirate and biopsy confirmed low grade lymphoma, suspect marginal zone lymphoma   05/08/2013 Imaging   PET scan show involvement of liver and spleen   06/02/2013 - 06/23/2013 Chemotherapy  She was started on weekly rituximab.   09/04/2013 Imaging   PET CT scan show complete response to treatment with splenic size reduced back to normal limits. She has persistent mild thrombocytopenia.   12/06/2013 Imaging   PET scan was negative and spleen size is normal   06/06/2014 Imaging   PEt scan is negative   06/05/2015 Imaging   No findings to suggest  residual/recurrent splenic lymphoma   06/16/2016 Imaging   1. No enlarged lymph nodes or mass within the chest abdomen or pelvis to suggest residual/recurrent lymphoma. 2. Small pericardial effusion. Unchanged 3. Scar like densities noted within the right middle lobe status post pneumonia.   06/02/2017 Imaging   1. Stable CT of the abdomen and pelvis. No mass or adenopathy to suggest residual/recurrent lymphoma. 2. Similar small pericardial effusion.     REVIEW OF SYSTEMS:   Constitutional: Denies fevers, chills or abnormal weight loss Eyes: Denies blurriness of vision Ears, nose, mouth, throat, and face: Denies mucositis or sore throat Respiratory: Denies cough, dyspnea or wheezes Cardiovascular: Denies palpitation, chest discomfort or lower extremity swelling Skin: Denies abnormal skin rashes Lymphatics: Denies new lymphadenopathy or easy bruising Neurological:Denies numbness, tingling or new weaknesses Behavioral/Psych: Mood is stable, no new changes  All other systems were reviewed with the patient and are negative.  I have reviewed the past medical history, past surgical history, social history and family history with the patient and they are unchanged from previous note.  ALLERGIES:  is allergic to biaxin [clarithromycin]; erythromycin; soy allergy; zithromax [azithromycin]; and zofran [ondansetron hcl].  MEDICATIONS:  Current Outpatient Medications  Medication Sig Dispense Refill  . cholecalciferol (VITAMIN D) 1000 units tablet Take 2,000 Units by mouth daily. Reported on 07/01/2015    . estradiol (ESTRACE) 1 MG tablet Take 1 mg by mouth daily.    . Linaclotide (LINZESS) 290 MCG CAPS capsule Take 1 capsule (290 mcg total) by mouth every morning. 30 capsule 5  . metoCLOPramide (REGLAN) 5 MG tablet TAKE 1 TABLET BY MOUTH 2 (TWO) TIMES DAILY AT 10 AM AND 4 PM. (Patient taking differently: Take 5 mg by mouth 2 (two) times daily. ) 180 tablet 1  . progesterone (PROMETRIUM) 100 MG  capsule Take 100 mg by mouth at bedtime.     . temazepam (RESTORIL) 30 MG capsule Take 2 capsules (60 mg total) by mouth at bedtime. 60 capsule 1  . traZODone (DESYREL) 50 MG tablet Take 50 mg by mouth at bedtime.     No current facility-administered medications for this visit.     PHYSICAL EXAMINATION: ECOG PERFORMANCE STATUS: 1 - Symptomatic but completely ambulatory  Vitals:   11/15/18 1422  BP: 128/72  Pulse: 70  Resp: 18  SpO2: 100%   Filed Weights   11/15/18 1422  Weight: 144 lb 1.6 oz (65.4 kg)    GENERAL:alert, no distress and comfortable SKIN: skin color, texture, turgor are normal, no rashes or significant lesions EYES: normal, Conjunctiva are pink and non-injected, sclera clear OROPHARYNX:no exudate, no erythema and lips, buccal mucosa, and tongue normal  NECK: supple, thyroid normal size, non-tender, without nodularity LYMPH:  no palpable lymphadenopathy in the cervical, axillary or inguinal LUNGS: clear to auscultation and percussion with normal breathing effort HEART: regular rate & rhythm and no murmurs and no lower extremity edema ABDOMEN:abdomen appears distended.  Generalized tenderness on exam is noted with some rebound but no guarding.  No palpable splenomegaly Musculoskeletal:no cyanosis of digits and no clubbing  NEURO: alert & oriented x  3 with fluent speech, no focal motor/sensory deficits  LABORATORY DATA:  I have reviewed the data as listed    Component Value Date/Time   NA 131 (L) 11/15/2018 1250   NA 128 (L) 12/03/2016 1445   K 3.9 11/15/2018 1250   K 4.2 12/03/2016 1445   CL 96 (L) 11/15/2018 1250   CO2 24 11/15/2018 1250   CO2 27 12/03/2016 1445   GLUCOSE 75 11/15/2018 1250   GLUCOSE 96 12/03/2016 1445   BUN 11 11/15/2018 1250   BUN 11.4 12/03/2016 1445   CREATININE 0.91 11/15/2018 1250   CREATININE 1.1 12/03/2016 1445   CALCIUM 9.3 11/15/2018 1250   CALCIUM 9.4 12/03/2016 1445   PROT 6.0 (L) 11/15/2018 1250   PROT 6.3 (L) 12/03/2016  1445   ALBUMIN 3.7 11/15/2018 1250   ALBUMIN 4.3 12/03/2016 1445   AST 13 (L) 11/15/2018 1250   AST 17 12/03/2016 1445   ALT 9 11/15/2018 1250   ALT 12 12/03/2016 1445   ALKPHOS 109 11/15/2018 1250   ALKPHOS 126 12/03/2016 1445   BILITOT 0.4 11/15/2018 1250   BILITOT 0.50 12/03/2016 1445   GFRNONAA >60 11/15/2018 1250   GFRAA >60 11/15/2018 1250    No results found for: SPEP, UPEP  Lab Results  Component Value Date   WBC 5.3 11/15/2018   NEUTROABS 3.0 11/15/2018   HGB 12.6 11/15/2018   HCT 37.3 11/15/2018   MCV 88.4 11/15/2018   PLT 125 (L) 11/15/2018      Chemistry      Component Value Date/Time   NA 131 (L) 11/15/2018 1250   NA 128 (L) 12/03/2016 1445   K 3.9 11/15/2018 1250   K 4.2 12/03/2016 1445   CL 96 (L) 11/15/2018 1250   CO2 24 11/15/2018 1250   CO2 27 12/03/2016 1445   BUN 11 11/15/2018 1250   BUN 11.4 12/03/2016 1445   CREATININE 0.91 11/15/2018 1250   CREATININE 1.1 12/03/2016 1445      Component Value Date/Time   CALCIUM 9.3 11/15/2018 1250   CALCIUM 9.4 12/03/2016 1445   ALKPHOS 109 11/15/2018 1250   ALKPHOS 126 12/03/2016 1445   AST 13 (L) 11/15/2018 1250   AST 17 12/03/2016 1445   ALT 9 11/15/2018 1250   ALT 12 12/03/2016 1445   BILITOT 0.4 11/15/2018 1250   BILITOT 0.50 12/03/2016 1445       RADIOGRAPHIC STUDIES: I have personally reviewed the radiological images as listed and agreed with the findings in the report. Dg Abd 2 Views  Result Date: 11/15/2018 CLINICAL DATA:  Abdominal bloating. Lymphoma. EXAM: ABDOMEN - 2 VIEW COMPARISON:  CT scan of the abdomen dated 06/02/2017 FINDINGS: The bowel gas pattern is normal. There is no evidence of free air. No radio-opaque calculi or other significant radiographic abnormality is seen. Ovoid calcification overlying the left iliac bone is a benign calcification in the subcutaneous fat of left buttock, likely an injection granuloma. IMPRESSION: Benign-appearing abdomen. Electronically Signed   By:  Lorriane Shire M.D.   On: 11/15/2018 12:58    All questions were answered. The patient knows to call the clinic with any problems, questions or concerns. No barriers to learning was detected.  I spent 25 minutes counseling the patient face to face. The total time spent in the appointment was 30 minutes and more than 50% was on counseling and review of test results  Heath Lark, MD 11/15/2018 2:28 PM

## 2018-11-15 NOTE — Telephone Encounter (Signed)
Called and given below message. She verbalized understanding. She is agreeable to appts. Appts scheduled.

## 2018-11-15 NOTE — Assessment & Plan Note (Signed)
She has some mild dysuria Urinalysis is borderline abnormal I will order a urine culture to exclude urinary tract infection

## 2018-11-15 NOTE — Assessment & Plan Note (Signed)
She has diffuse generalized abdominal pain with mild tenderness on exam throughout This is nonspecific Diverticulitis cannot be excluded Ischemic colitis cannot be excluded Recurrence of cancer cannot be excluded She will continue to take pain medicine as needed I will order CT imaging for further evaluation as the x-ray of the abdomen was not revealing She agreed with the plan of care

## 2018-11-16 ENCOUNTER — Telehealth: Payer: Self-pay | Admitting: Hematology and Oncology

## 2018-11-16 LAB — URINE CULTURE

## 2018-11-16 NOTE — Telephone Encounter (Signed)
Scheduled appt per 10/28 sch message - pt is aware of appt date and time.   

## 2018-11-17 ENCOUNTER — Encounter (HOSPITAL_COMMUNITY): Payer: Self-pay

## 2018-11-17 ENCOUNTER — Other Ambulatory Visit: Payer: Self-pay

## 2018-11-17 ENCOUNTER — Ambulatory Visit (HOSPITAL_COMMUNITY)
Admission: RE | Admit: 2018-11-17 | Discharge: 2018-11-17 | Disposition: A | Discharge status: Home / Self Care | Payer: PPO | Source: Ambulatory Visit | Attending: Hematology and Oncology | Admitting: Hematology and Oncology

## 2018-11-17 DIAGNOSIS — C8590 Non-Hodgkin lymphoma, unspecified, unspecified site: Secondary | ICD-10-CM | POA: Diagnosis not present

## 2018-11-17 DIAGNOSIS — C8307 Small cell B-cell lymphoma, spleen: Secondary | ICD-10-CM | POA: Insufficient documentation

## 2018-11-17 MED ORDER — IOHEXOL 300 MG/ML  SOLN
100.0000 mL | Freq: Once | INTRAMUSCULAR | Status: AC | PRN
  Administered 2018-11-17: 100 mL via INTRAVENOUS

## 2018-11-17 MED ORDER — SODIUM CHLORIDE (PF) 0.9 % IJ SOLN
INTRAMUSCULAR | Status: AC
  Filled 2018-11-17: qty 50

## 2018-11-18 ENCOUNTER — Encounter: Payer: Self-pay | Admitting: Hematology and Oncology

## 2018-11-18 ENCOUNTER — Inpatient Hospital Stay (HOSPITAL_BASED_OUTPATIENT_CLINIC_OR_DEPARTMENT_OTHER): Payer: PPO | Admitting: Hematology and Oncology

## 2018-11-18 ENCOUNTER — Other Ambulatory Visit: Payer: Self-pay

## 2018-11-18 ENCOUNTER — Telehealth: Payer: Self-pay | Admitting: *Deleted

## 2018-11-18 VITALS — BP 110/65 | HR 79 | Temp 98.3°F | Resp 18 | Ht 68.0 in | Wt 144.1 lb

## 2018-11-18 DIAGNOSIS — K5909 Other constipation: Secondary | ICD-10-CM

## 2018-11-18 DIAGNOSIS — R1084 Generalized abdominal pain: Secondary | ICD-10-CM | POA: Diagnosis not present

## 2018-11-18 DIAGNOSIS — C8307 Small cell B-cell lymphoma, spleen: Secondary | ICD-10-CM

## 2018-11-18 DIAGNOSIS — N858 Other specified noninflammatory disorders of uterus: Secondary | ICD-10-CM | POA: Insufficient documentation

## 2018-11-18 DIAGNOSIS — Z8572 Personal history of non-Hodgkin lymphomas: Secondary | ICD-10-CM | POA: Diagnosis not present

## 2018-11-18 MED ORDER — SUPREP BOWEL PREP KIT 17.5-3.13-1.6 GM/177ML PO SOLN: 1.0000 | Freq: Once | ORAL | 0 refills | Status: AC

## 2018-11-18 NOTE — Telephone Encounter (Signed)
Referral to Dr. Bobbye Charleston sent via fax. Patient advised Dr. Malachi Carl office number and confirmation of referral. Fax confirmation received.   Notified patient of new RX sent to her pharmacy.

## 2018-11-18 NOTE — Assessment & Plan Note (Addendum)
She has abnormal uterine mass noted on imaging She is not symptomatic She denies abnormal Pap smear or vaginal discharge The patient would like a second opinion from a different gynecologist in her previous physician I will refer her to a different gynecologist for further management She will likely need pelvic ultrasound evaluation and endometrial biopsy

## 2018-11-18 NOTE — Progress Notes (Signed)
[place] OFFICE PROGRESS NOTE  Patient Care Team: Velna Hatchet, MD as PCP - General (Internal Medicine) Fleet Contras, MD as Consulting Physician (Nephrology)  ASSESSMENT & PLAN:  Splenic marginal zone b-cell lymphoma [facility] CT scan showed no evidence of lymphoma However, abnormal uterine appearance is noted She is also noted to be constipated From the lymphoma standpoint, I will see her in 1 year for further follow-up  Generalized abdominal pain Her abdominal pain, bloating and other symptoms are most consistent with severe constipation She has minimal response to Linzess I will prescribe prescription strength laxative to help her with constipation If GYN evaluation is negative, I will refer her back to GI service for further follow-up  Uterine mass She has abnormal uterine mass noted on imaging She is not symptomatic She denies abnormal Pap smear or vaginal discharge The patient would like a second opinion from a different gynecologist in her previous physician I will refer her to a different gynecologist for further management She will likely need pelvic ultrasound evaluation and endometrial biopsy   Orders Placed This Encounter  Procedures  . Ambulatory referral to Gynecology    Referral Priority:   Routine    Referral Type:   Consultation    Referral Reason:   Specialty Services Required    Referred to Provider:   Bobbye Charleston, MD    Number of Visits Requested:   1    INTERVAL HISTORY: Please see below for problem oriented charting. She returns to review CT imaging She continues to feel constipated and bloated She denies recent abnormal vaginal bleeding Her husband is available over the telephone  SUMMARY OF ONCOLOGIC HISTORY: Oncology History Overview Note  Non-Hodgkin lymphoma, marginal zone lymphoma   Primary site: Lymphoid Neoplasms   Staging method: AJCC 6th Edition   Clinical: Stage IV signed by Heath Lark, MD on 05/30/2013   8:47 AM   Summary: Stage IV   Splenic marginal zone b-cell lymphoma ([place])  02/23/2013 Imaging   CT scan show significant enlargement of liver and spleen   05/05/2013 Bone Marrow Biopsy   Bone marrow aspirate and biopsy confirmed low grade lymphoma, suspect marginal zone lymphoma   05/08/2013 Imaging   PET scan show involvement of liver and spleen   06/02/2013 - 06/23/2013 Chemotherapy   She was started on weekly rituximab.   09/04/2013 Imaging   PET CT scan show complete response to treatment with splenic size reduced back to normal limits. She has persistent mild thrombocytopenia.   12/06/2013 Imaging   PET scan was negative and spleen size is normal   06/06/2014 Imaging   PEt scan is negative   06/05/2015 Imaging   No findings to suggest residual/recurrent splenic lymphoma   06/16/2016 Imaging   1. No enlarged lymph nodes or mass within the chest abdomen or pelvis to suggest residual/recurrent lymphoma. 2. Small pericardial effusion. Unchanged 3. Scar like densities noted within the right middle lobe status post pneumonia.   06/02/2017 Imaging   1. Stable CT of the abdomen and pelvis. No mass or adenopathy to suggest residual/recurrent lymphoma. 2. Similar small pericardial effusion.   11/17/2018 Imaging   1. Normal size of the spleen. No abdominal or pelvic lymphadenopathy.   2. Fluid or soft tissue within the endometrial cavity, increased compared to prior examination and abnormal in the late postmenopausal setting. Recommend pelvic ultrasound further evaluate.   3.  Small pericardial effusion, unchanged.     REVIEW OF SYSTEMS:   Constitutional: Denies fevers, chills or  abnormal weight loss Eyes: Denies blurriness of vision Ears, nose, mouth, throat, and face: Denies mucositis or sore throat Respiratory: Denies cough, dyspnea or wheezes Cardiovascular: Denies palpitation, chest discomfort or lower extremity swelling Skin: Denies abnormal skin rashes Lymphatics: Denies  new lymphadenopathy or easy bruising Neurological:Denies numbness, tingling or new weaknesses Behavioral/Psych: Mood is stable, no new changes  All other systems were reviewed with the patient and are negative.  I have reviewed the past medical history, past surgical history, social history and family history with the patient and they are unchanged from previous note.  ALLERGIES:  is allergic to biaxin [clarithromycin]; erythromycin; soy allergy; zithromax [azithromycin]; and zofran [ondansetron hcl].  MEDICATIONS:  Current Outpatient Medications  Medication Sig Dispense Refill  . cholecalciferol (VITAMIN D) 1000 units tablet Take 2,000 Units by mouth daily. Reported on 07/01/2015    . estradiol (ESTRACE) 1 MG tablet Take 1 mg by mouth daily.    . Linaclotide (LINZESS) 290 MCG CAPS capsule Take 1 capsule (290 mcg total) by mouth every morning. 30 capsule 5  . metoCLOPramide (REGLAN) 5 MG tablet TAKE 1 TABLET BY MOUTH 2 (TWO) TIMES DAILY AT 10 AM AND 4 PM. (Patient taking differently: Take 5 mg by mouth 2 (two) times daily. ) 180 tablet 1  . Na Sulfate-K Sulfate-Mg Sulf (SUPREP BOWEL PREP KIT) 17.5-3.13-1.6 GM/177ML SOLN Take 1 Bottle by mouth once for 1 dose. 177 mL 0  . progesterone (PROMETRIUM) 100 MG capsule Take 100 mg by mouth at bedtime.     . temazepam (RESTORIL) 30 MG capsule Take 2 capsules (60 mg total) by mouth at bedtime. 60 capsule 1  . traZODone (DESYREL) 50 MG tablet Take 50 mg by mouth at bedtime.     No current facility-administered medications for this visit.     PHYSICAL EXAMINATION: ECOG PERFORMANCE STATUS: 1 - Symptomatic but completely ambulatory  Vitals:   11/18/18 1246  BP: 110/65  Pulse: 79  Resp: 18  Temp: 98.3 F (36.8 C)  SpO2: 100%   Filed Weights   11/18/18 1246  Weight: 144 lb 1.6 oz (65.4 kg)    GENERAL:alert, no distress and comfortable Musculoskeletal:no cyanosis of digits and no clubbing  NEURO: alert & oriented x 3 with fluent speech, no  focal motor/sensory deficits  LABORATORY DATA:  I have reviewed the data as listed    Component Value Date/Time   NA 131 (L) 11/15/2018 1250   NA 128 (L) 12/03/2016 1445   K 3.9 11/15/2018 1250   K 4.2 12/03/2016 1445   CL 96 (L) 11/15/2018 1250   CO2 24 11/15/2018 1250   CO2 27 12/03/2016 1445   GLUCOSE 75 11/15/2018 1250   GLUCOSE 96 12/03/2016 1445   BUN 11 11/15/2018 1250   BUN 11.4 12/03/2016 1445   CREATININE 0.91 11/15/2018 1250   CREATININE 1.1 12/03/2016 1445   CALCIUM 9.3 11/15/2018 1250   CALCIUM 9.4 12/03/2016 1445   PROT 6.0 (L) 11/15/2018 1250   PROT 6.3 (L) 12/03/2016 1445   ALBUMIN 3.7 11/15/2018 1250   ALBUMIN 4.3 12/03/2016 1445   AST 13 (L) 11/15/2018 1250   AST 17 12/03/2016 1445   ALT 9 11/15/2018 1250   ALT 12 12/03/2016 1445   ALKPHOS 109 11/15/2018 1250   ALKPHOS 126 12/03/2016 1445   BILITOT 0.4 11/15/2018 1250   BILITOT 0.50 12/03/2016 1445   GFRNONAA >60 11/15/2018 1250   GFRAA >60 11/15/2018 1250    No results found for: SPEP, UPEP  Lab Results  Component Value Date   WBC 5.3 11/15/2018   NEUTROABS 3.0 11/15/2018   HGB 12.6 11/15/2018   HCT 37.3 11/15/2018   MCV 88.4 11/15/2018   PLT 125 (L) 11/15/2018      Chemistry      Component Value Date/Time   NA 131 (L) 11/15/2018 1250   NA 128 (L) 12/03/2016 1445   K 3.9 11/15/2018 1250   K 4.2 12/03/2016 1445   CL 96 (L) 11/15/2018 1250   CO2 24 11/15/2018 1250   CO2 27 12/03/2016 1445   BUN 11 11/15/2018 1250   BUN 11.4 12/03/2016 1445   CREATININE 0.91 11/15/2018 1250   CREATININE 1.1 12/03/2016 1445      Component Value Date/Time   CALCIUM 9.3 11/15/2018 1250   CALCIUM 9.4 12/03/2016 1445   ALKPHOS 109 11/15/2018 1250   ALKPHOS 126 12/03/2016 1445   AST 13 (L) 11/15/2018 1250   AST 17 12/03/2016 1445   ALT 9 11/15/2018 1250   ALT 12 12/03/2016 1445   BILITOT 0.4 11/15/2018 1250   BILITOT 0.50 12/03/2016 1445       RADIOGRAPHIC STUDIES: I have reviewed CT imaging  with the patient I have personally reviewed the radiological images as listed and agreed with the findings in the report. Ct Abdomen Pelvis W Contrast  Result Date: 11/17/2018 CLINICAL DATA:  Lymphoma EXAM: CT ABDOMEN AND PELVIS WITH CONTRAST TECHNIQUE: Multidetector CT imaging of the abdomen and pelvis was performed using the standard protocol following bolus administration of intravenous contrast. CONTRAST:  141m OMNIPAQUE IOHEXOL 300 MG/ML SOLN, additional oral enteric contrast COMPARISON:  06/02/2017 FINDINGS: Lower chest: Small pericardial effusion, unchanged compared to prior examination. Hepatobiliary: No solid liver abnormality is seen. Numerous unchanged low-attenuation lesions of the liver, generally consistent with small cysts or hemangiomata. No gallstones, gallbladder wall thickening, or biliary dilatation. Pancreas: Unremarkable. No pancreatic ductal dilatation or surrounding inflammatory changes. Spleen: Normal in size without significant abnormality. Adrenals/Urinary Tract: Adrenal glands are unremarkable. Kidneys are normal, without renal calculi, solid lesion, or hydronephrosis. Bladder is unremarkable. Stomach/Bowel: Stomach is within normal limits. Status post appendectomy. No evidence of bowel wall thickening, distention, or inflammatory changes. Moderate burden of stool in the colon. Vascular/Lymphatic: No significant vascular findings are present. No enlarged abdominal or pelvic lymph nodes. Reproductive: Fluid or soft tissue within the endometrial cavity, increased compared to prior examination. Other: No abdominal wall hernia or abnormality. No abdominopelvic ascites. Musculoskeletal: No acute or significant osseous findings. IMPRESSION: 1. Normal size of the spleen. No abdominal or pelvic lymphadenopathy. 2. Fluid or soft tissue within the endometrial cavity, increased compared to prior examination and abnormal in the late postmenopausal setting. Recommend pelvic ultrasound further  evaluate. 3.  Small pericardial effusion, unchanged. Electronically Signed   By: AEddie CandleM.D.   On: 11/17/2018 13:56   Dg Abd 2 Views  Result Date: 11/15/2018 CLINICAL DATA:  Abdominal bloating. Lymphoma. EXAM: ABDOMEN - 2 VIEW COMPARISON:  CT scan of the abdomen dated 06/02/2017 FINDINGS: The bowel gas pattern is normal. There is no evidence of free air. No radio-opaque calculi or other significant radiographic abnormality is seen. Ovoid calcification overlying the left iliac bone is a benign calcification in the subcutaneous fat of left buttock, likely an injection granuloma. IMPRESSION: Benign-appearing abdomen. Electronically Signed   By: JLorriane ShireM.D.   On: 11/15/2018 12:58    All questions were answered. The patient knows to call the clinic with any problems, questions or concerns. No barriers to learning was  detected.  I spent 15 minutes counseling the patient face to face. The total time spent in the appointment was 20 minutes and more than 50% was on counseling and review of test results  Heath Lark, MD 11/18/2018 1:11 PM

## 2018-11-18 NOTE — Assessment & Plan Note (Signed)
Her abdominal pain, bloating and other symptoms are most consistent with severe constipation She has minimal response to Linzess I will prescribe prescription strength laxative to help her with constipation If GYN evaluation is negative, I will refer her back to GI service for further follow-up

## 2018-11-18 NOTE — Assessment & Plan Note (Signed)
CT scan showed no evidence of lymphoma However, abnormal uterine appearance is noted She is also noted to be constipated From the lymphoma standpoint, I will see her in 1 year for further follow-up

## 2018-11-22 ENCOUNTER — Telehealth: Payer: Self-pay

## 2018-11-22 NOTE — Telephone Encounter (Signed)
-----   Message from Heath Lark, MD sent at 11/22/2018  7:53 AM EST ----- Regarding: referral to GYN Can you follow up on GYN referral from last week?

## 2018-11-22 NOTE — Telephone Encounter (Signed)
Called office and gave message to office staff. She will have referral person call the office.

## 2018-11-23 NOTE — Telephone Encounter (Signed)
Called back and left a message for referral person to call the office back.

## 2018-11-23 NOTE — Telephone Encounter (Signed)
Dr. Philis Pique office called back. They are not taking any new Medicare referrals. Per office staff, [facility] is taking medicare referrals.

## 2018-11-24 ENCOUNTER — Telehealth: Payer: Self-pay

## 2018-11-24 NOTE — Telephone Encounter (Signed)
I suggest referral to Dr. Lahoma Crocker if she does not mind driving pls call her and let me know

## 2018-11-24 NOTE — Telephone Encounter (Signed)
Called and given below message. She is going to call and get appt with GYN herself. She will call the office back.

## 2018-11-24 NOTE — Telephone Encounter (Signed)
She called back and left a message. She has appt with Dr. Helane Rima at [facility] on 11/13 at 1200.

## 2018-12-02 DIAGNOSIS — R9389 Abnormal findings on diagnostic imaging of other specified body structures: Secondary | ICD-10-CM | POA: Diagnosis not present

## 2018-12-06 ENCOUNTER — Other Ambulatory Visit (HOSPITAL_COMMUNITY)
Admission: RE | Admit: 2018-12-06 | Discharge: 2018-12-06 | Disposition: A | Discharge status: Home / Self Care | Payer: PPO | Source: Ambulatory Visit | Attending: Obstetrics and Gynecology | Admitting: Obstetrics and Gynecology

## 2018-12-06 DIAGNOSIS — Z01812 Encounter for preprocedural laboratory examination: Secondary | ICD-10-CM | POA: Insufficient documentation

## 2018-12-06 DIAGNOSIS — Z20828 Contact with and (suspected) exposure to other viral communicable diseases: Secondary | ICD-10-CM | POA: Diagnosis not present

## 2018-12-06 LAB — SARS CORONAVIRUS 2 (TAT 6-24 HRS): SARS Coronavirus 2: NEGATIVE

## 2018-12-06 NOTE — H&P (Signed)
68 year old female here for D and C, Hysteroscopy and removal of endometrial polyp. Incidentally found 16 mm endometrial strips on CT scan No PMP  Bleeding.  Prior to Admission medications   Medication Sig Start Date End Date Taking? Authorizing Provider  cholecalciferol (VITAMIN D) 1000 units tablet Take 2,000 Units by mouth daily. Reported on 07/01/2015    [provider]  estradiol (ESTRACE) 1 MG tablet Take 1 mg by mouth daily. 01/27/18   [provider]  Linaclotide Rolan Lipa) 290 MCG CAPS capsule Take 1 capsule (290 mcg total) by mouth every morning. 07/14/13   Ricard Dillon, MD  metoCLOPramide (REGLAN) 5 MG tablet TAKE 1 TABLET BY MOUTH 2 (TWO) TIMES DAILY AT 10 AM AND 4 PM. Patient taking differently: Take 5 mg by mouth 2 (two) times daily.     Ricard Dillon, MD  progesterone (PROMETRIUM) 100 MG capsule Take 100 mg by mouth at bedtime.  04/27/13   [provider]  temazepam (RESTORIL) 30 MG capsule Take 2 capsules (60 mg total) by mouth at bedtime. 12/18/13   Hoyt Koch, MD  traZODone (DESYREL) 50 MG tablet Take 50 mg by mouth at bedtime.    [provider]   Past Medical History:  Diagnosis Date  . Chronic interstitial cystitis   . Chronic kidney disease (CKD), stage II (mild) 05/25/2014  . Diverticulosis    mild   . GERD (gastroesophageal reflux disease)   . H/O: iron deficiency anemia   . Hemorrhoids   . Hemorrhoids 07/26/2013  . Hiatal hernia    small   . IBS (irritable bowel syndrome)   . Insomnia, unspecified   . Interstitial cystitis   . 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[place]) 11/15/2014  . Non Hodgkin's lymphoma ([place]) 04/19/2013  . Raynaud's syndrome   . Splenomegaly   . Stricture and stenosis of esophagus   . Thrombocytopenia, unspecified ([place])   . Unspecified constipation    Past Surgical History:  Procedure Laterality Date  . APPENDECTOMY  2012  . APPENDECTOMY  2012  . BALLOON DILATION N/A 09/06/2014   Procedure:  BALLOON DILATION;  Surgeon: [name], MD;  Location: [facility];  Service: Endoscopy;  Laterality: N/A;  . BREAST LUMPECTOMY    . ESOPHAGEAL DILATION    . ESOPHAGOGASTRODUODENOSCOPY (EGD) WITH PROPOFOL N/A 09/06/2014   Procedure: ESOPHAGOGASTRODUODENOSCOPY (EGD) WITH PROPOFOL;  Surgeon: [name], MD;  Location: [facility];  Service: Endoscopy;  Laterality: N/A;  . TONSILLECTOMY    . TUBAL LIGATION    . VIDEO BRONCHOSCOPY Bilateral 08/23/2015   Procedure: VIDEO BRONCHOSCOPY WITH FLUORO;  Surgeon: Javier Glazier, MD;  Location: [facility];  Service: Cardiopulmonary;  Laterality: Bilateral;   Biaxin [clarithromycin], Erythromycin, Soy allergy, Zithromax [azithromycin], and Zofran [ondansetron hcl]  Family History  Problem Relation Age of Onset  . Lymphoma Father        lymphoma  . Cirrhosis Father        heavy drinker  . Diabetes Maternal Grandmother   . Colon cancer Maternal Grandfather   . Cancer Maternal Grandfather        liver ca ?   Social History   Socioeconomic History  . Marital status: Married    Spouse name: Not on file  . Number of children: 2  . Years of education: Not on file  . Highest education level: Not on file  Occupational History  . Occupation: [employer]  . Financial resource strain:  Not on file  . Food insecurity    Worry: Not on file    Inability: Not on file  . Transportation needs    Medical: Not on file    Non-medical: Not on file  Tobacco Use  . Smoking status: Passive Smoke Exposure - Never Smoker  . Smokeless tobacco: Never Used  . Tobacco comment: Father growing up & daughter currently  Substance and Sexual Activity  . Alcohol use: No  . Drug use: No  . Sexual activity: Not on file  Lifestyle  . Physical activity    Days per week: Not on file    Minutes per session: Not on file  . Stress: Not on file  Relationships  . Social Herbalist on phone: Not on file    Gets together: Not on file     Attends religious service: Not on file    Active member of club or organization: Not on file    Attends meetings of clubs or organizations: Not on file    Relationship status: Not on file  Other Topics Concern  . Not on file  Social History Narrative   Originally from [place]. She moved to [place] in the 1980s. She is a Orthoptist. She has also traveled to [place], [place], & [place]. No pets currently. No bird exposure. 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 Enjoys spending time with family & reading.    General alert and oriented  Lung CTAB Car RRR Abdomen is soft and non tender  IMPRESSION:  Thickened endometrium  PLAN: D and C, Hysteroscopy  Removal of endometrial polyp Risks reviewed with patient

## 2018-12-07 ENCOUNTER — Encounter (HOSPITAL_BASED_OUTPATIENT_CLINIC_OR_DEPARTMENT_OTHER): Payer: Self-pay | Admitting: *Deleted

## 2018-12-07 ENCOUNTER — Other Ambulatory Visit: Payer: Self-pay

## 2018-12-07 NOTE — Progress Notes (Addendum)
Spoke w/ via phone for pre-op interview---Shannon Barajas needs dos----  Cbc, type and screen            Barajas results------lov dr Alvy Bimler 11-18-18 chart/epic ekg 02-16-18 chart/epic COVID test ------12-05-18 Arrive at -------530  12-08-18 NPO after ------midnight Medications to take morning of surgery -----none Diabetic medication -----n/a Patient Special Instructions ----- Pre-Op special Istructions ----- Patient verbalized understanding of instructions that were given at this phone interview. Patient denies shortness of breath, chest pain, fever, cough a this phone interview.

## 2018-12-07 NOTE — Anesthesia Preprocedure Evaluation (Addendum)
Anesthesia Evaluation  Patient identified by MRN, date of birth, ID band Patient awake    Reviewed: Allergy & Precautions, NPO status , Patient's Chart, lab work & pertinent test results  History of Anesthesia Complications (+) PONV and history of anesthetic complications  Airway Mallampati: II  TM Distance: >3 FB Neck ROM: Full  Mouth opening: Limited Mouth Opening  Dental no notable dental hx. (+) Teeth Intact, Dental Advisory Given   Pulmonary neg pulmonary ROS,    Pulmonary exam normal        Cardiovascular negative cardio ROS Normal cardiovascular exam     Neuro/Psych negative neurological ROS  negative psych ROS   GI/Hepatic Neg liver ROS, hiatal hernia, GERD  Controlled,  Endo/Other  negative endocrine ROS  Renal/GU Renal InsufficiencyRenal disease  negative genitourinary   Musculoskeletal negative musculoskeletal ROS (+)   Abdominal   Peds  Hematology negative hematology ROS (+)   Anesthesia Other Findings Day of surgery medications reviewed with patient.  Reproductive/Obstetrics negative OB ROS                           Anesthesia Physical Anesthesia Plan  ASA: II  Anesthesia Plan: General   Post-op Pain Management:    Induction: Intravenous  PONV Risk Score and Plan: 4 or greater and Treatment may vary due to age or medical condition, Dexamethasone, TIVA and Propofol infusion  Airway Management Planned: LMA  Additional Equipment: None  Intra-op Plan:   Post-operative Plan: Extubation in OR  Informed Consent: I have reviewed the patients History and Physical, chart, labs and discussed the procedure including the risks, benefits and alternatives for the proposed anesthesia with the patient or authorized representative who has indicated his/her understanding and acceptance.     Dental advisory given  Plan Discussed with: CRNA  Anesthesia Plan Comments:         Anesthesia Quick Evaluation

## 2018-12-08 ENCOUNTER — Ambulatory Visit (HOSPITAL_BASED_OUTPATIENT_CLINIC_OR_DEPARTMENT_OTHER): Payer: PPO | Admitting: Anesthesiology

## 2018-12-08 ENCOUNTER — Ambulatory Visit (HOSPITAL_BASED_OUTPATIENT_CLINIC_OR_DEPARTMENT_OTHER)
Admission: RE | Admit: 2018-12-08 | Discharge: 2018-12-08 | Disposition: A | Discharge status: Home / Self Care | Payer: PPO | Attending: Obstetrics and Gynecology | Admitting: Obstetrics and Gynecology

## 2018-12-08 ENCOUNTER — Encounter (HOSPITAL_BASED_OUTPATIENT_CLINIC_OR_DEPARTMENT_OTHER): Payer: Self-pay | Admitting: *Deleted

## 2018-12-08 ENCOUNTER — Other Ambulatory Visit: Payer: Self-pay

## 2018-12-08 ENCOUNTER — Encounter (HOSPITAL_BASED_OUTPATIENT_CLINIC_OR_DEPARTMENT_OTHER)
Admission: RE | Disposition: A | Discharge status: Home / Self Care | Payer: Self-pay | Source: Home / Self Care | Attending: Obstetrics and Gynecology

## 2018-12-08 DIAGNOSIS — N182 Chronic kidney disease, stage 2 (mild): Secondary | ICD-10-CM | POA: Insufficient documentation

## 2018-12-08 DIAGNOSIS — Z8572 Personal history of non-Hodgkin lymphomas: Secondary | ICD-10-CM | POA: Diagnosis not present

## 2018-12-08 DIAGNOSIS — K219 Gastro-esophageal reflux disease without esophagitis: Secondary | ICD-10-CM | POA: Insufficient documentation

## 2018-12-08 DIAGNOSIS — Z79899 Other long term (current) drug therapy: Secondary | ICD-10-CM | POA: Diagnosis not present

## 2018-12-08 DIAGNOSIS — R9389 Abnormal findings on diagnostic imaging of other specified body structures: Secondary | ICD-10-CM | POA: Diagnosis not present

## 2018-12-08 DIAGNOSIS — E871 Hypo-osmolality and hyponatremia: Secondary | ICD-10-CM | POA: Diagnosis not present

## 2018-12-08 DIAGNOSIS — N8500 Endometrial hyperplasia, unspecified: Secondary | ICD-10-CM | POA: Diagnosis not present

## 2018-12-08 DIAGNOSIS — N840 Polyp of corpus uteri: Secondary | ICD-10-CM | POA: Insufficient documentation

## 2018-12-08 DIAGNOSIS — I7300 Raynaud's syndrome without gangrene: Secondary | ICD-10-CM | POA: Diagnosis not present

## 2018-12-08 DIAGNOSIS — D696 Thrombocytopenia, unspecified: Secondary | ICD-10-CM | POA: Diagnosis not present

## 2018-12-08 DIAGNOSIS — N183 Chronic kidney disease, stage 3 unspecified: Secondary | ICD-10-CM | POA: Diagnosis not present

## 2018-12-08 HISTORY — DX: Other specified postprocedural states: Z98.890

## 2018-12-08 HISTORY — PX: HYSTEROSCOPY WITH D & C: SHX1775

## 2018-12-08 HISTORY — DX: Nausea with vomiting, unspecified: R11.2

## 2018-12-08 LAB — TYPE AND SCREEN
ABO/RH(D): B POS
Antibody Screen: NEGATIVE

## 2018-12-08 LAB — CBC
HCT: 38.1 % (ref 36.0–46.0)
Hemoglobin: 12.5 g/dL (ref 12.0–15.0)
MCH: 29.9 pg (ref 26.0–34.0)
MCHC: 32.8 g/dL (ref 30.0–36.0)
MCV: 91.1 fL (ref 80.0–100.0)
Platelets: 127 10*3/uL — ABNORMAL LOW (ref 150–400)
RBC: 4.18 MIL/uL (ref 3.87–5.11)
RDW: 12.4 % (ref 11.5–15.5)
WBC: 4.7 10*3/uL (ref 4.0–10.5)
nRBC: 0 % (ref 0.0–0.2)

## 2018-12-08 LAB — ABO/RH: ABO/RH(D): B POS

## 2018-12-08 SURGERY — DILATATION AND CURETTAGE /HYSTEROSCOPY
Anesthesia: General | Site: Vagina

## 2018-12-08 MED ORDER — DEXAMETHASONE SODIUM PHOSPHATE 10 MG/ML IJ SOLN
INTRAMUSCULAR | Status: DC | PRN
  Administered 2018-12-08: 5 mg via INTRAVENOUS

## 2018-12-08 MED ORDER — ARTIFICIAL TEARS OPHTHALMIC OINT
TOPICAL_OINTMENT | OPHTHALMIC | Status: AC
  Filled 2018-12-08: qty 3.5

## 2018-12-08 MED ORDER — DEXAMETHASONE SODIUM PHOSPHATE 10 MG/ML IJ SOLN
INTRAMUSCULAR | Status: AC
  Filled 2018-12-08: qty 1

## 2018-12-08 MED ORDER — PROMETHAZINE HCL 25 MG/ML IJ SOLN
INTRAMUSCULAR | Status: AC
  Filled 2018-12-08: qty 1

## 2018-12-08 MED ORDER — LIDOCAINE HCL 1 % IJ SOLN
INTRAMUSCULAR | Status: AC
  Filled 2018-12-08: qty 20

## 2018-12-08 MED ORDER — SODIUM CHLORIDE 0.9 % IV SOLN
2.0000 g | INTRAVENOUS | Status: AC
  Administered 2018-12-08: 07:00:00 10 g via INTRAVENOUS
  Filled 2018-12-08: qty 2

## 2018-12-08 MED ORDER — ACETAMINOPHEN 500 MG PO TABS
1000.0000 mg | ORAL_TABLET | Freq: Once | ORAL | Status: AC
  Administered 2018-12-08: 1000 mg via ORAL
  Filled 2018-12-08: qty 2

## 2018-12-08 MED ORDER — FENTANYL CITRATE (PF) 100 MCG/2ML IJ SOLN
25.0000 ug | INTRAMUSCULAR | Status: DC | PRN
  Administered 2018-12-08 (×2): 25 ug via INTRAVENOUS
  Filled 2018-12-08: qty 1

## 2018-12-08 MED ORDER — SODIUM CHLORIDE 0.9 % IV SOLN
INTRAVENOUS | Status: DC
  Administered 2018-12-08: 06:00:00 via INTRAVENOUS
  Filled 2018-12-08: qty 1000

## 2018-12-08 MED ORDER — ACETAMINOPHEN 500 MG PO TABS
ORAL_TABLET | ORAL | Status: AC
  Filled 2018-12-08: qty 2

## 2018-12-08 MED ORDER — FENTANYL CITRATE (PF) 100 MCG/2ML IJ SOLN
INTRAMUSCULAR | Status: DC | PRN
  Administered 2018-12-08: 50 ug via INTRAVENOUS

## 2018-12-08 MED ORDER — PROMETHAZINE HCL 25 MG/ML IJ SOLN
6.2500 mg | INTRAMUSCULAR | Status: DC | PRN
  Administered 2018-12-08: 6.25 mg via INTRAVENOUS
  Filled 2018-12-08: qty 1

## 2018-12-08 MED ORDER — FENTANYL CITRATE (PF) 100 MCG/2ML IJ SOLN
INTRAMUSCULAR | Status: AC
  Filled 2018-12-08: qty 2

## 2018-12-08 MED ORDER — ONDANSETRON HCL 4 MG/2ML IJ SOLN
INTRAMUSCULAR | Status: AC
  Filled 2018-12-08: qty 2

## 2018-12-08 MED ORDER — KETOROLAC TROMETHAMINE 30 MG/ML IJ SOLN
15.0000 mg | Freq: Once | INTRAMUSCULAR | Status: AC | PRN
  Administered 2018-12-08: 15 mg via INTRAVENOUS
  Filled 2018-12-08: qty 1

## 2018-12-08 MED ORDER — PROPOFOL 10 MG/ML IV BOLUS
INTRAVENOUS | Status: DC | PRN
  Administered 2018-12-08: 150 mg via INTRAVENOUS
  Administered 2018-12-08: 30 mg via INTRAVENOUS

## 2018-12-08 MED ORDER — LACTATED RINGERS IV SOLN
INTRAVENOUS | Status: DC
  Administered 2018-12-08: 07:00:00 via INTRAVENOUS
  Filled 2018-12-08: qty 1000

## 2018-12-08 MED ORDER — SODIUM CHLORIDE 0.9 % IV SOLN
INTRAVENOUS | Status: AC
  Filled 2018-12-08: qty 2

## 2018-12-08 MED ORDER — LIDOCAINE HCL 1 % IJ SOLN
INTRAMUSCULAR | Status: DC | PRN
  Administered 2018-12-08: 10 mL

## 2018-12-08 MED ORDER — OXYCODONE HCL 5 MG/5ML PO SOLN
5.0000 mg | Freq: Once | ORAL | Status: DC | PRN
  Filled 2018-12-08: qty 5

## 2018-12-08 MED ORDER — SODIUM CHLORIDE 0.9 % IR SOLN
Status: DC | PRN
  Administered 2018-12-08: 3000 mL

## 2018-12-08 MED ORDER — KETOROLAC TROMETHAMINE 30 MG/ML IJ SOLN
INTRAMUSCULAR | Status: AC
  Filled 2018-12-08: qty 1

## 2018-12-08 MED ORDER — PROPOFOL 500 MG/50ML IV EMUL
INTRAVENOUS | Status: DC | PRN
  Administered 2018-12-08: 200 ug/kg/min via INTRAVENOUS

## 2018-12-08 MED ORDER — PROPOFOL 10 MG/ML IV BOLUS
INTRAVENOUS | Status: AC
  Filled 2018-12-08: qty 40

## 2018-12-08 MED ORDER — FENTANYL CITRATE (PF) 100 MCG/2ML IJ SOLN
INTRAMUSCULAR | Status: AC
  Filled 2018-12-08: qty 4

## 2018-12-08 MED ORDER — LIDOCAINE 2% (20 MG/ML) 5 ML SYRINGE
INTRAMUSCULAR | Status: DC | PRN
  Administered 2018-12-08: 40 mg via INTRAVENOUS

## 2018-12-08 MED ORDER — LIDOCAINE 2% (20 MG/ML) 5 ML SYRINGE
INTRAMUSCULAR | Status: AC
  Filled 2018-12-08: qty 5

## 2018-12-08 MED ORDER — OXYCODONE HCL 5 MG PO TABS
5.0000 mg | ORAL_TABLET | Freq: Once | ORAL | Status: DC | PRN
  Filled 2018-12-08: qty 1

## 2018-12-08 SURGICAL SUPPLY — 16 items
BIPOLAR CUTTING LOOP 21FR (ELECTRODE)
CANISTER SUCT 3000ML PPV (MISCELLANEOUS) ×2 IMPLANT
CATH ROBINSON RED A/P 16FR (CATHETERS) ×2 IMPLANT
COUNTER NEEDLE 1200 MAGNETIC (NEEDLE) ×2 IMPLANT
COVER WAND RF STERILE (DRAPES) ×2 IMPLANT
DILATOR CANAL MILEX (MISCELLANEOUS) ×2 IMPLANT
GLOVE BIO SURGEON STRL SZ 6.5 (GLOVE) ×4 IMPLANT
GOWN STRL REUS W/TWL LRG LVL3 (GOWN DISPOSABLE) ×4 IMPLANT
KIT PROCEDURE FLUENT (KITS) ×2 IMPLANT
KIT TURNOVER CYSTO (KITS) ×2 IMPLANT
LOOP CUTTING BIPOLAR 21FR (ELECTRODE) IMPLANT
NEEDLE SPNL 25GX3.5 QUINCKE BL (NEEDLE) ×2 IMPLANT
PACK VAGINAL MINOR WOMEN LF (CUSTOM PROCEDURE TRAY) ×4 IMPLANT
PAD PREP 24X48 CUFFED NSTRL (MISCELLANEOUS) ×2 IMPLANT
TOWEL OR 17X26 10 PK STRL BLUE (TOWEL DISPOSABLE) ×4 IMPLANT
WATER STERILE IRR 500ML POUR (IV SOLUTION) ×2 IMPLANT

## 2018-12-08 NOTE — Transfer of Care (Signed)
Immediate Anesthesia Transfer of Care Note  Patient: Shannon Barajas  Procedure(s) Performed: DILATATION AND CURETTAGE /HYSTEROSCOPY (N/A Vagina )  Patient Location: PACU  Anesthesia Type:General  Level of Consciousness: awake, alert , oriented and patient cooperative  Airway & Oxygen Therapy: Patient Spontanous Breathing and Patient connected to nasal cannula oxygen  Post-op Assessment: Report given to RN and Post -op Vital signs reviewed and stable  Post vital signs: Reviewed and stable  Last Vitals:  Vitals Value Taken Time  BP 109/60 12/08/18 0812  Temp    Pulse 64 12/08/18 0814  Resp 17 12/08/18 0814  SpO2 100 % 12/08/18 0814  Vitals shown include unvalidated device data.  Last Pain:  Vitals:   12/08/18 0604  TempSrc: Oral         Complications: No apparent anesthesia complications

## 2018-12-08 NOTE — Anesthesia Procedure Notes (Signed)
Procedure Name: LMA Insertion Date/Time: 12/08/2018 7:34 AM Performed by: Wanita Chamberlain, CRNA Pre-anesthesia Checklist: Patient identified, Emergency Drugs available, Suction available and Patient being monitored Patient Re-evaluated:Patient Re-evaluated prior to induction Oxygen Delivery Method: Circle system utilized Preoxygenation: Pre-oxygenation with 100% oxygen Induction Type: IV induction Ventilation: Mask ventilation without difficulty LMA: LMA inserted LMA Size: 4.0 Number of attempts: 1 Placement Confirmation: breath sounds checked- equal and bilateral,  CO2 detector and positive ETCO2 Tube secured with: Tape Dental Injury: Teeth and Oropharynx as per pre-operative assessment

## 2018-12-08 NOTE — Anesthesia Postprocedure Evaluation (Signed)
Anesthesia Post Note  Patient: Shannon Barajas  Procedure(s) Performed: DILATATION AND CURETTAGE /HYSTEROSCOPY (N/A Vagina )     Patient location during evaluation: PACU Anesthesia Type: General Level of consciousness: awake and alert and oriented Pain management: pain level controlled Vital Signs Assessment: post-procedure vital signs reviewed and stable Respiratory status: spontaneous breathing, nonlabored ventilation and respiratory function stable Cardiovascular status: blood pressure returned to baseline Postop Assessment: no apparent nausea or vomiting Anesthetic complications: no    Last Vitals:  Vitals:   12/08/18 0859 12/08/18 0900  BP: 97/60   Pulse: (!) 57 60  Resp: 14 12  Temp:    SpO2: 95% 96%    Last Pain:  Vitals:   12/08/18 0859  TempSrc:   PainSc: [place]

## 2018-12-08 NOTE — Progress Notes (Signed)
No changes H and P on the chart  BP 103/67   Pulse 66   Temp 97.8 F (36.6 C) (Oral)   Resp 16   Ht 5\' 8"  (1.727 m)   Wt 65.4 kg   SpO2 100%   BMI 21.91 kg/m  Results for orders placed or performed during the hospital encounter of 12/08/18 (from the past 24 hour(s))  Type and screen  [facility]UR[facility]ERY [facility]ENTER     Status: None (Preliminary result)   Collection Time: 12/08/18  6:24 AM  Result Value Ref Range   ABO/RH(D) PENDING    Antibody Screen PENDING    Sample Expiration      12/11/2018,2359 Performed at [facility], [place] [address], [place], [place] [address]   CBC     Status: Abnormal   Collection Time: 12/08/18  6:24 AM  Result Value Ref Range   WBC 4.7 4.0 - 10.5 K/uL   RBC 4.18 3.87 - 5.11 MIL/uL   Hemoglobin 12.5 12.0 - 15.0 g/dL   HCT 38.1 36.0 - 46.0 %   MCV 91.1 80.0 - 100.0 fL   MCH 29.9 26.0 - 34.0 pg   MCHC 32.8 30.0 - 36.0 g/dL   RDW 12.4 11.5 - 15.5 %   Platelets 127 (L) 150 - 400 K/uL   nRBC 0.0 0.0 - 0.2 %   Will proceed with D and C, Hysteroscopy and removal of endometrial polyp

## 2018-12-08 NOTE — Op Note (Signed)
NAME: Shannon Barajas, CABACUNGAN MEDICAL RECORD [id number] ACCOUNT [id number] DATE OF BIRTH:22-Apr-1950 FACILITY: [facility] LOCATION: [facility] PHYSICIAN:Torrin Frein Lynett Fish, MD  OPERATIVE REPORT  DATE OF PROCEDURE:  12/08/2018  PREOPERATIVE DIAGNOSIS:  Thickened endometrium.  POSTOPERATIVE DIAGNOSIS:  Thickened endometrium, endometrial polyp.  SURGEON:  Dian Queen, MD  ANESTHESIA:  MAC with paracervical.  PROCEDURE:  Dilation and curettage, hysteroscopy and removal of endometrial polyp.  ESTIMATED BLOOD LOSS:  Minimal.  FLUID DEFICIT:  110 mL.  COMPLICATIONS:  None.  PATHOLOGY:  Endometrial tissue.  DESCRIPTION OF PROCEDURE:  The patient was taken to the operating room.  She was administered anesthesia in the standard fashion.  She was prepped and draped in the lithotomy position.  An in and out catheter was used to empty the bladder.  The speculum  was inserted into the vagina.  The cervix was grasped with a tenaculum and a paracervical block was performed.  The cervical internal os was gently dilated using Pratt dilators.  The diagnostic hysteroscope was inserted into the uterine cavity.  With  excellent visualization, we could see a large endometrial polyp that was attached to the posterior wall of the uterus.  The hysteroscope was removed.  The uterus was curetted at this time.  Polyp forceps were inserted several times and fragments of the  endometrial polyp were removed.  We reinserted the hysteroscope and the endometrial polyp had been resected.  At the end of the procedure, all sponge, lap and instrument counts were correct x2.  There was no vaginal bleeding noted.  All tissue was sent  to pathology.  The patient went to recovery room in stable condition.  TN/NUANCE  D:12/08/2018 T:12/08/2018 JOB:[id number]

## 2018-12-08 NOTE — Brief Op Note (Signed)
12/08/2018  7:57 AM  PATIENT:  Shannon Barajas  68 y.o. female  PRE-OPERATIVE DIAGNOSIS:   Thickened Endometrium  POST-OPERATIVE DIAGNOSIS:   Endometrial Polyp  PROCEDURE:  Procedure(s): DILATATION AND CURETTAGE /HYSTEROSCOPY (N/A)  Removal of endometrial polyp  SURGEON:  Surgeon(s) and Role:    * Dian Queen, MD - Primary  PHYSICIAN ASSISTANT:   ASSISTANTS: none   ANESTHESIA:   MAC  EBL:  Minimal    BLOOD ADMINISTERED:none  DRAINS: none   LOCAL MEDICATIONS USED:  LIDOCAINE   SPECIMEN:  Source of Specimen:  endometrial curettings with polyp fragments  DISPOSITION OF SPECIMEN:  PATHOLOGY  COUNTS:  YES  TOURNIQUET:  * No tourniquets in log *  DICTATION: .Other Dictation: Dictation Number dictated  PLAN OF CARE: Discharge to home after PACU  PATIENT DISPOSITION:  PACU - hemodynamically stable.   Delay start of Pharmacological VTE agent (>24hrs) due to surgical blood loss or risk of bleeding: not applicable

## 2018-12-09 ENCOUNTER — Encounter (HOSPITAL_BASED_OUTPATIENT_CLINIC_OR_DEPARTMENT_OTHER): Payer: Self-pay | Admitting: Obstetrics and Gynecology

## 2018-12-09 LAB — SURGICAL PATHOLOGY

## 2019-01-20 HISTORY — PX: EYE SURGERY: SHX253

## 2019-04-28 ENCOUNTER — Ambulatory Visit: Payer: Medicare Other | Admitting: Gastroenterology

## 2019-04-28 ENCOUNTER — Encounter: Payer: Self-pay | Admitting: Gastroenterology

## 2019-04-28 VITALS — BP 112/70 | HR 87 | Temp 97.0°F | Ht 68.0 in | Wt 137.0 lb

## 2019-04-28 DIAGNOSIS — R1310 Dysphagia, unspecified: Secondary | ICD-10-CM

## 2019-04-28 DIAGNOSIS — K222 Esophageal obstruction: Secondary | ICD-10-CM

## 2019-04-28 MED ORDER — SUPREP BOWEL PREP KIT 17.5-3.13-1.6 GM/177ML PO SOLN: 1.0000 | ORAL | 0 refills | Status: DC

## 2019-04-28 NOTE — Progress Notes (Signed)
Review of pertinent gastrointestinal problems: 1.  Schatzki's ring; dilated multiple times by Dr. Henrene Pastor previously. Most recent EGD was 2016 with balloon dilation to 94mm.  Switched care to Dr. Ardis Hughs in 2016.  EGD July 2019 Dr. Ardis Hughs showed thick Schatzki's ring which was dilated to 19 mm with good effect 2.  Routine risk for colon cancer, colonoscopy 06/2009 done for rectal bleeding, iron deficiency anemia by Dr. Henrene Pastor showed diverticulosis and internal hemorrhoids   HPI: This is a very pleasant 69 year old woman whom I last saw  I last saw her about 2 years ago at the time of an EGD and repeat Schatzki's ring dilation, see those results summarized above.  She tells me that the stricture dilation helped for about a year and a half but in the past 6 weeks it has started to recur with periodic dysphagia.  She has had to regurgitate food out when it gets stuck.  She has not lost any weight.  She is currently not on any antiacid medicines.  She tells me that her kidney doctor told her she cannot take them because of chronic kidney disease.  She does not get heartburn  She does have chronic constipation for which she takes Linzess 290 mcg once daily.  She thinks it is losing effect   ROS: complete GI ROS as described in HPI, all other review negative.  Constitutional:  No unintentional weight loss   Past Medical History:  Diagnosis Date  . Chronic interstitial cystitis   . Chronic kidney disease (CKD), stage III (moderate) 05/25/2014   nephrology dr Clover Mealy q year  . H/O: iron deficiency anemia   . Hemorrhoids   . Hemorrhoids 07/26/2013  . Hiatal hernia    small   . IBS (irritable bowel syndrome)   . Insomnia, unspecified   . Interstitial cystitis   . 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 Non Hodgkin's lymphoma ([place]) 04/19/2013  . PONV (postoperative nausea and vomiting)    likes phenergan  . Raynaud's syndrome   . Splenomegaly   . Stricture and stenosis of  esophagus   . Thrombocytopenia, unspecified ([place])   . Unspecified constipation     Past Surgical History:  Procedure Laterality Date  . APPENDECTOMY  2012  . APPENDECTOMY  2012  . BALLOON DILATION N/A 09/06/2014   Procedure: BALLOON DILATION;  Surgeon: [name], MD;  Location: [facility];  Service: Endoscopy;  Laterality: N/A;  . BREAST LUMPECTOMY Left   . DILATION AND CURETTAGE OF UTERUS  yrs ago  . ESOPHAGEAL DILATION    . ESOPHAGOGASTRODUODENOSCOPY (EGD) WITH PROPOFOL N/A 09/06/2014   Procedure: ESOPHAGOGASTRODUODENOSCOPY (EGD) WITH PROPOFOL;  Surgeon: [name], MD;  Location: [facility];  Service: Endoscopy;  Laterality: N/A;  . HYSTEROSCOPY WITH D & C N/A 12/08/2018   Procedure: DILATATION AND CURETTAGE /HYSTEROSCOPY;  Surgeon: Dian Queen, MD;  Location: [facility];  Service: Gynecology;  Laterality: N/A;  . TONSILLECTOMY    . TUBAL LIGATION    . VIDEO BRONCHOSCOPY Bilateral 08/23/2015   Procedure: VIDEO BRONCHOSCOPY WITH FLUORO;  Surgeon: Javier Glazier, MD;  Location: [facility];  Service: Cardiopulmonary;  Laterality: Bilateral;    Current Outpatient Medications  Medication Sig Dispense Refill  . cholecalciferol (VITAMIN D) 1000 units tablet Take 1,000 Units by mouth daily. Reported on 07/01/2015    . COLLAGEN PO Take by mouth in the morning and at bedtime.    Marland Kitchen estradiol (ESTRACE) 1 MG tablet Take 1 mg by  mouth daily.    . Linaclotide (LINZESS) 290 MCG CAPS capsule Take 1 capsule (290 mcg total) by mouth every morning. (Patient taking differently: Take 290 mcg by mouth as needed. ) 30 capsule 5  . metoCLOPramide (REGLAN) 5 MG tablet TAKE 1 TABLET BY MOUTH 2 (TWO) TIMES DAILY AT 10 AM AND 4 PM. (Patient taking differently: Take 5 mg by mouth 2 (two) times daily. ) 180 tablet 1  . progesterone (PROMETRIUM) 100 MG capsule Take 100 mg by mouth at bedtime.     . temazepam (RESTORIL) 30 MG capsule Take 2 capsules (60 mg total) by mouth at  bedtime. 60 capsule 1  . traZODone (DESYREL) 50 MG tablet Take 50 mg by mouth at bedtime.     No current facility-administered medications for this visit.    Allergies as of 04/28/2019 - Review Complete 04/28/2019  Allergen Reaction Noted  . Biaxin [clarithromycin] Other (See Comments)   . Erythromycin    . Soy allergy Other (See Comments) 04/27/2012  . Zithromax [azithromycin]  04/27/2012  . Zofran [ondansetron hcl]  08/20/2014    Family History  Problem Relation Age of Onset  . Lymphoma Father        lymphoma  . Cirrhosis Father        heavy drinker  . Diabetes Maternal Grandmother   . Colon cancer Maternal Grandfather   . Cancer Maternal Grandfather        liver ca ?    Social History   Socioeconomic History  . Marital status: Married    Spouse name: Not on file  . Number of children: 2  . Years of education: Not on file  . Highest education level: Not on file  Occupational History  . Occupation: retired  Tobacco Use  . Smoking status: Passive Smoke Exposure - Never Smoker  . Smokeless tobacco: Never Used  . Tobacco comment: Father growing up & daughter currently  Substance and Sexual Activity  . Alcohol use: No  . Drug use: No  . Sexual activity: Not on file  Other Topics Concern  . Not on file  Social History Narrative   Originally from [place]. She moved to [place] in the 1980s. She is a Orthoptist. She has also traveled to [place], [place], & [place]. No pets currently. No bird exposure. 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 Enjoys spending time with family & reading.    Social Determinants of Health   Financial Resource Strain:   . Difficulty of Paying Living Expenses:   Food Insecurity:   . Worried About Charity fundraiser in the Last Year:   . Arboriculturist in the Last Year:   Transportation Needs:   . Film/video editor (Medical):   Marland Kitchen Lack of Transportation (Non-Medical):   Physical Activity:   . Days of  Exercise per Week:   . Minutes of Exercise per Session:   Stress:   . Feeling of Stress :   Social Connections:   . Frequency of Communication with Friends and Family:   . Frequency of Social Gatherings with Friends and Family:   . Attends Religious Services:   . Active Member of Clubs or Organizations:   . Attends Archivist Meetings:   Marland Kitchen Marital Status:   Intimate Partner Violence:   . Fear of Current or Ex-Partner:   . Emotionally Abused:   Marland Kitchen Physically Abused:   . Sexually Abused:  Physical Exam: BP 112/70   Pulse 87   Temp (!) 97 F (36.1 C)   Ht 5\' 8"  (1.727 m)   Wt 137 lb (62.1 kg)   SpO2 98%   BMI 20.83 kg/m  Constitutional: generally well-appearing Psychiatric: alert and oriented x3 Abdomen: soft, nontender, nondistended, no obvious ascites, no peritoneal signs, normal bowel sounds No peripheral edema noted in lower extremities  Assessment and plan: 69 y.o. female with dysphagia, history of esophageal stricture, routine risk for colon cancer, chronic constipation  First I recommended repeat EGD with balloon dilation of her esophagus for her known esophageal benign stricture, Schatzki's ring.  I do think it is helpful for people to be on antiacid medicines and they are proving to require periodic stricture dilations.  Her renal doctor told her she cannot take antiacid medicines and I asked her to clarify that with them specifically asking if there are any antiacid medicines that they think would be safe for her kidneys.  The rationale for this is sometimes acid regurgitation can cause a bit of edema in the lower esophagus and that can exacerbate, quicken stricture reformation.  She is at routine risk for colon cancer at the same time as her EGD I recommended colonoscopy to be performed since it has been 10 years since her last colon cancer screening  She has mild chronic constipation.  Linzess does seem to help but not completely anymore even at high  dose 290 once daily.  I recommended that she add 1 dose of MiraLAX once daily to her regimen to see if that will help her move her bowels more regularly and easily  Please see the "Patient Instructions" section for addition details about the plan.  Owens Loffler, MD [facility] 04/28/2019, 2:22 PM   Total time on date of encounter was 82minutes (this included time spent preparing to see the patient reviewing records; obtaining and/or reviewing separately obtained history; performing a medically appropriate exam and/or evaluation; counseling and educating the patient and family if present; ordering medications, tests or procedures if applicable; and documenting clinical information in the health record).

## 2019-04-28 NOTE — Patient Instructions (Signed)
If you are age 71 or older, your body mass index should be between 23-30. Your Body mass index is 20.83 kg/m. If this is out of the aforementioned range listed, please consider follow up with your Primary Care Provider.  If you are age 45 or younger, your body mass index should be between 19-25. Your Body mass index is 20.83 kg/m. If this is out of the aformentioned range listed, please consider follow up with your Primary Care Provider.    You have been scheduled for an endoscopy and colonoscopy. Please follow the written instructions given to you at your visit today. Please pick up your prep supplies at the pharmacy within the next 1-3 days. If you use inhalers (even only as needed), please bring them with you on the day of your procedure.  Please contact your kidney doctor to find out what kind of antiacid you are able to use.  Thank you for entrusting me with your care and choosing [facility].  Dr Ardis Hughs

## 2019-05-16 ENCOUNTER — Telehealth: Payer: Self-pay | Admitting: Gastroenterology

## 2019-05-16 MED ORDER — PANTOPRAZOLE SODIUM 40 MG PO TBEC: 40.0000 mg | DELAYED_RELEASE_TABLET | Freq: Every day | ORAL | 11 refills | Status: DC

## 2019-05-16 NOTE — Telephone Encounter (Signed)
Returned call to patient and she states she has spoken with her kidney doctor, and she can take protonix.  Please advise strength and doseage.

## 2019-05-16 NOTE — Telephone Encounter (Signed)
Okay, thanks.  Let us prescribe Protonix or generic pantoprazole 40 mg pills 1 pill once daily dispense 1 month with 11 refills.  Thanks

## 2019-05-16 NOTE — Telephone Encounter (Signed)
Rx for pantoprazole 40mg  one tablet daily sent to pharmacy.  Patient aware.  No further questions.

## 2019-05-16 NOTE — Telephone Encounter (Signed)
Patient requesting a script for Protonix

## 2019-05-25 ENCOUNTER — Encounter: Payer: Self-pay | Admitting: Gastroenterology

## 2019-05-31 ENCOUNTER — Other Ambulatory Visit: Payer: Self-pay

## 2019-05-31 ENCOUNTER — Ambulatory Visit (AMBULATORY_SURGERY_CENTER): Payer: Medicare Other | Admitting: Gastroenterology

## 2019-05-31 ENCOUNTER — Encounter: Payer: Self-pay | Admitting: Gastroenterology

## 2019-05-31 VITALS — BP 117/71 | HR 70 | Temp 96.9°F | Resp 14 | Ht 68.0 in | Wt 137.0 lb

## 2019-05-31 DIAGNOSIS — R1310 Dysphagia, unspecified: Secondary | ICD-10-CM

## 2019-05-31 DIAGNOSIS — Z1211 Encounter for screening for malignant neoplasm of colon: Secondary | ICD-10-CM

## 2019-05-31 DIAGNOSIS — K5900 Constipation, unspecified: Secondary | ICD-10-CM

## 2019-05-31 DIAGNOSIS — R222 Localized swelling, mass and lump, trunk: Secondary | ICD-10-CM | POA: Diagnosis not present

## 2019-05-31 MED ORDER — SODIUM CHLORIDE 0.9 % IV SOLN: 500.0000 mL | Freq: Once | INTRAVENOUS | Status: DC

## 2019-05-31 NOTE — Patient Instructions (Signed)
YOU HAD AN ENDOSCOPIC PROCEDURE TODAY AT THE [facility]:   Refer to the procedure report that was given to you for any specific questions about what was found during the examination.  If the procedure report does not answer your questions, please call your gastroenterologist to clarify.  If you requested that your care partner not be given the details of your procedure findings, then the procedure report has been included in a sealed envelope for you to review at your convenience later.  YOU SHOULD EXPECT: Some feelings of bloating in the abdomen. Passage of more gas than usual.  Walking can help get rid of the air that was put into your GI tract during the procedure and reduce the bloating. If you had a lower endoscopy (such as a colonoscopy or flexible sigmoidoscopy) you may notice spotting of blood in your stool or on the toilet paper. If you underwent a bowel prep for your procedure, you may not have a normal bowel movement for a few days.  Please Note:  You might notice some irritation and congestion in your nose or some drainage.  This is from the oxygen used during your procedure.  There is no need for concern and it should clear up in a day or so.  SYMPTOMS TO REPORT IMMEDIATELY:   Following lower endoscopy (colonoscopy or flexible sigmoidoscopy):  Excessive amounts of blood in the stool  Significant tenderness or worsening of abdominal pains  Swelling of the abdomen that is new, acute  Fever of 100F or higher   Following upper endoscopy (EGD)  Vomiting of blood or coffee ground material  New chest pain or pain under the shoulder blades  Painful or persistently difficult swallowing  New shortness of breath  Fever of 100F or higher  Black, tarry-looking stools  For urgent or emergent issues, a gastroenterologist can be reached at any hour by calling [phone]. Do not use MyChart messaging for urgent concerns.    DIET:  We do recommend a small meal at first, but  then you may proceed to your regular diet.  Drink plenty of fluids but you should avoid alcoholic beverages for 24 hours.  ACTIVITY:  You should plan to take it easy for the rest of today and you should NOT DRIVE or use heavy machinery until tomorrow (because of the sedation medicines used during the test).    FOLLOW UP: Our staff will call the number listed on your records 48-72 hours following your procedure to check on you and address any questions or concerns that you may have regarding the information given to you following your procedure. If we do not reach you, we will leave a message.  We will attempt to reach you two times.  During this call, we will ask if you have developed any symptoms of COVID 19. If you develop any symptoms (ie: fever, flu-like symptoms, shortness of breath, cough etc.) before then, please call [phone].  If you test positive for Covid 19 in the 2 weeks post procedure, please call and report this information to us.    If any biopsies were taken you will be contacted by phone or by letter within the next 1-3 weeks.  Please call us at [phone] if you have not heard about the biopsies in 3 weeks.    SIGNATURES/CONFIDENTIALITY: You and/or your care partner have signed paperwork which will be entered into your electronic medical record.  These signatures attest to the fact that that the information above on   your After Visit Summary has been reviewed and is understood.  Full responsibility of the confidentiality of this discharge information lies with you and/or your care-partner. 

## 2019-05-31 NOTE — Op Note (Addendum)
[place] Patient Name: Shannon Barajas Procedure Date: 05/31/2019 1:55 PM MRN: [id number] Endoscopist: [name] , MD Age: 69 Referring MD:  Date of Birth: 1950/09/09 Gender: Female Account #: [id number] Procedure:                Colonoscopy Indications:              Screening for colorectal malignant neoplasm Medicines:                Monitored Anesthesia Care Procedure:                Pre-Anesthesia Assessment:                           - Prior to the procedure, a History and Physical                            was performed, and patient medications and                            allergies were reviewed. The patient's tolerance of                            previous anesthesia was also reviewed. The risks                            and benefits of the procedure and the sedation                            options and risks were discussed with the patient.                            All questions were answered, and informed consent                            was obtained. Prior Anticoagulants: The patient has                            taken no previous anticoagulant or antiplatelet                            agents. ASA Grade Assessment: II - A patient with                            mild systemic disease. After reviewing the risks                            and benefits, the patient was deemed in                            satisfactory condition to undergo the procedure.                           After obtaining informed consent, the colonoscope  was passed under direct vision. Throughout the                            procedure, the patient's blood pressure, pulse, and                            oxygen saturations were monitored continuously. The                            Colonoscope was introduced through the anus and                            advanced to the the cecum, identified by                            appendiceal orifice and  ileocecal valve. The                            colonoscopy was performed without difficulty. The                            patient tolerated the procedure well. The quality                            of the bowel preparation was good. The ileocecal                            valve, appendiceal orifice, and rectum were                            photographed. Scope In: 2:05:32 PM Scope Out: 2:17:20 PM Scope Withdrawal Time: 0 hours 7 minutes 53 seconds  Total Procedure Duration: 0 hours 11 minutes 48 seconds  Findings:                 External and internal hemorrhoids were found. The                            hemorrhoids were small.                           The exam was otherwise without abnormality on                            direct and retroflexion views. Complications:            No immediate complications. Estimated blood loss:                            None. Estimated Blood Loss:     Estimated blood loss: none. Impression:               - External and internal hemorrhoids.                           - The examination was otherwise normal on direct  and retroflexion views.                           - No polyps ro cancers. Recommendation:           - EGD now.                           - You do not need any further colon cancer                            screening tests (including stool testing). These                            types of tests generally stop around age 34-80. [name], MD 05/31/2019 2:19:16 PM This report has been signed electronically.

## 2019-05-31 NOTE — Progress Notes (Signed)
Called to room to assist during endoscopic procedure.  Patient ID and intended procedure confirmed with present staff. Received instructions for my participation in the procedure from the performing physician.  

## 2019-05-31 NOTE — Op Note (Addendum)
[place] Patient Name: Shannon Barajas Procedure Date: 05/31/2019 1:55 PM MRN: [id number] Endoscopist: [name] , MD Age: 69 Referring MD:  Date of Birth: 1951/01/05 Gender: Female Account #: [id number] Procedure:                Upper GI endoscopy Indications:              Dysphagia; Schatzki's ring; dilated multiple times                            by Dr. Henrene Pastor previously. EGD 2016 with balloon                            dilation to 67mm. Switched care to Dr. Ardis Hughs in                            2016. EGD July 2019 Dr. Ardis Hughs showed thick                            Schatzki's ring which was dilated to 19 mm with                            good effect Medicines:                Monitored Anesthesia Care Procedure:                Pre-Anesthesia Assessment:                           - Prior to the procedure, a History and Physical                            was performed, and patient medications and                            allergies were reviewed. The patient's tolerance of                            previous anesthesia was also reviewed. The risks                            and benefits of the procedure and the sedation                            options and risks were discussed with the patient.                            All questions were answered, and informed consent                            was obtained. Prior Anticoagulants: The patient has                            taken no previous anticoagulant or antiplatelet  agents. ASA Grade Assessment: III - A patient with                            severe systemic disease. After reviewing the risks                            and benefits, the patient was deemed in                            satisfactory condition to undergo the procedure.                           After obtaining informed consent, the endoscope was                            passed under direct vision. Throughout the                            procedure, the patient's blood pressure, pulse, and                            oxygen saturations were monitored continuously. The                            Endoscope was introduced through the mouth, and                            advanced to the second part of duodenum. The upper                            GI endoscopy was accomplished without difficulty.                            The patient tolerated the procedure well. Scope In: Scope Out: Findings:                 One benign-appearing, intrinsic mild stenosis was                            found at the gastroesophageal junction (Schatzki's                            ring). A TTS dilator was passed through the scope.                            Dilation with an 18-19-20 mm balloon dilator was                            performed to 20 mm held inflated for 1 minute..                           The exam was otherwise without abnormality. Complications:            No immediate complications. Estimated blood loss:  None. Estimated Blood Loss:     Estimated blood loss: none. Impression:               - Schatzki's ring, dilated to 54mm.                           - The examination was otherwise normal. Recommendation:           - Patient has a contact number available for                            emergencies. The signs and symptoms of potential                            delayed complications were discussed with the                            patient. Return to normal activities tomorrow.                            Written discharge instructions were provided to the                            patient.                           - Resume previous diet. Eat slowly, take small                            bites and chew your food very well.                           - Continue present medications. [name], MD 05/31/2019 2:32:30 PM This report has been signed electronically.

## 2019-05-31 NOTE — Progress Notes (Signed)
Report given to PACU, vss 

## 2019-06-02 ENCOUNTER — Ambulatory Visit (INDEPENDENT_AMBULATORY_CARE_PROVIDER_SITE_OTHER): Payer: Medicare Other

## 2019-06-02 ENCOUNTER — Other Ambulatory Visit: Payer: Self-pay | Admitting: Podiatry

## 2019-06-02 ENCOUNTER — Telehealth: Payer: Self-pay

## 2019-06-02 ENCOUNTER — Ambulatory Visit: Payer: Medicare Other | Admitting: Podiatry

## 2019-06-02 ENCOUNTER — Other Ambulatory Visit: Payer: Self-pay

## 2019-06-02 ENCOUNTER — Encounter: Payer: Self-pay | Admitting: Podiatry

## 2019-06-02 ENCOUNTER — Ambulatory Visit: Payer: Medicare Other

## 2019-06-02 DIAGNOSIS — M79672 Pain in left foot: Secondary | ICD-10-CM

## 2019-06-02 DIAGNOSIS — M79671 Pain in right foot: Secondary | ICD-10-CM | POA: Diagnosis not present

## 2019-06-02 NOTE — Telephone Encounter (Signed)
  Follow up Call-  Call back number 05/31/2019 08/18/2017  Post procedure Call Back phone  # [phone]  Permission to leave phone message Yes Yes  Some recent data might be hidden     Patient questions:  Do you have a fever, pain , or abdominal swelling? No. Pain Score  0 *  Have you tolerated food without any problems? Yes.    Have you been able to return to your normal activities? Yes.    Do you have any questions about your discharge instructions: Diet   No. Medications  No. Follow up visit  No.  Do you have questions or concerns about your Care? No.  Actions: * If pain score is 4 or above: No action needed, pain <4.  1. Have you developed a fever since your procedure? No  2.   Have you had an respiratory symptoms (SOB or cough) since your procedure? No 3.   Have you tested positive for COVID 19 since your procedure No  4.   Have you had any family members/close contacts diagnosed with the COVID 19 since your procedure?  No   If yes to any of these questions please route to Joylene John, RN and Erenest Rasher, RN

## 2019-06-05 NOTE — Progress Notes (Signed)
Subjective:   Patient ID: Shannon Barajas, female   DOB: 69 y.o.   MRN: [id number]   HPI Patient presents with pain in her left foot and states that it is been hurting when she walks or wear shoe gear.  States it is been ongoing and gradually more of an issue for her.  She does not smoke likes to be active and states this has been more life altering over time   ROS      Objective:  Physical Exam Vitals and nursing note reviewed.  Constitutional:      Appearance: She is well-developed.  Pulmonary:     Effort: Pulmonary effort is normal.  Musculoskeletal:        General: Normal range of motion.  Skin:    General: Skin is warm.  Neurological:     Mental Status: She is alert.     Neurovascular status was found to be intact muscle strength was adequate with patient found to have inflammation of her diffuse nature of her foot with warm area that was not noted to be specific and it appears to be more just all over achy type feeling in her foot.  Patient has good digital perfusion well oriented x3     Assessment:  Low-grade inflammatory process left with fluid buildup and inflammation around the tendon complex bilateral     Plan:  H&P condition reviewed and I recommended continued anti-inflammatories physical therapy and support.  Patient will be seen back as needed and is encouraged to call with questions  X-rays indicate there is no signs of fracture or any bone disease currently

## 2019-08-10 ENCOUNTER — Other Ambulatory Visit: Payer: Self-pay | Admitting: Podiatry

## 2019-08-10 DIAGNOSIS — M79672 Pain in left foot: Secondary | ICD-10-CM

## 2019-08-10 DIAGNOSIS — M79671 Pain in right foot: Secondary | ICD-10-CM

## 2019-09-29 ENCOUNTER — Other Ambulatory Visit: Payer: Self-pay | Admitting: *Deleted

## 2019-09-29 DIAGNOSIS — D696 Thrombocytopenia, unspecified: Secondary | ICD-10-CM

## 2019-10-02 ENCOUNTER — Inpatient Hospital Stay: Payer: Medicare Other

## 2019-10-02 ENCOUNTER — Inpatient Hospital Stay: Payer: Medicare Other | Attending: Hematology and Oncology | Admitting: Hematology and Oncology

## 2019-10-02 ENCOUNTER — Encounter: Payer: Self-pay | Admitting: Hematology and Oncology

## 2019-10-02 ENCOUNTER — Other Ambulatory Visit: Payer: Self-pay

## 2019-10-02 VITALS — BP 121/70 | HR 75 | Temp 96.3°F | Resp 17 | Ht 68.0 in | Wt 133.8 lb

## 2019-10-02 DIAGNOSIS — Z8572 Personal history of non-Hodgkin lymphomas: Secondary | ICD-10-CM | POA: Insufficient documentation

## 2019-10-02 DIAGNOSIS — Z299 Encounter for prophylactic measures, unspecified: Secondary | ICD-10-CM | POA: Insufficient documentation

## 2019-10-02 DIAGNOSIS — N183 Chronic kidney disease, stage 3 unspecified: Secondary | ICD-10-CM | POA: Diagnosis not present

## 2019-10-02 DIAGNOSIS — R5383 Other fatigue: Secondary | ICD-10-CM

## 2019-10-02 DIAGNOSIS — R634 Abnormal weight loss: Secondary | ICD-10-CM | POA: Insufficient documentation

## 2019-10-02 DIAGNOSIS — Z23 Encounter for immunization: Secondary | ICD-10-CM | POA: Insufficient documentation

## 2019-10-02 DIAGNOSIS — C8307 Small cell B-cell lymphoma, spleen: Secondary | ICD-10-CM

## 2019-10-02 DIAGNOSIS — D696 Thrombocytopenia, unspecified: Secondary | ICD-10-CM

## 2019-10-02 LAB — CBC WITH DIFFERENTIAL (CANCER CENTER ONLY)
Abs Immature Granulocytes: 0.02 10*3/uL (ref 0.00–0.07)
Basophils Absolute: 0 10*3/uL (ref 0.0–0.1)
Basophils Relative: 1 %
Eosinophils Absolute: 0.1 10*3/uL (ref 0.0–0.5)
Eosinophils Relative: 1 %
HCT: 38.9 % (ref 36.0–46.0)
Hemoglobin: 13.1 g/dL (ref 12.0–15.0)
Immature Granulocytes: 0 %
Lymphocytes Relative: 29 %
Lymphs Abs: 1.7 10*3/uL (ref 0.7–4.0)
MCH: 31.2 pg (ref 26.0–34.0)
MCHC: 33.7 g/dL (ref 30.0–36.0)
MCV: 92.6 fL (ref 80.0–100.0)
Monocytes Absolute: 0.5 10*3/uL (ref 0.1–1.0)
Monocytes Relative: 8 %
Neutro Abs: 3.4 10*3/uL (ref 1.7–7.7)
Neutrophils Relative %: 61 %
Platelet Count: 154 10*3/uL (ref 150–400)
RBC: 4.2 MIL/uL (ref 3.87–5.11)
RDW: 11.9 % (ref 11.5–15.5)
WBC Count: 5.7 10*3/uL (ref 4.0–10.5)
nRBC: 0 % (ref 0.0–0.2)

## 2019-10-02 LAB — CMP (CANCER CENTER ONLY)
ALT: 14 U/L (ref 0–44)
AST: 18 U/L (ref 15–41)
Albumin: 4.2 g/dL (ref 3.5–5.0)
Alkaline Phosphatase: 115 U/L (ref 38–126)
Anion gap: 8 (ref 5–15)
BUN: 15 mg/dL (ref 8–23)
CO2: 28 mmol/L (ref 22–32)
Calcium: 9.4 mg/dL (ref 8.9–10.3)
Chloride: 102 mmol/L (ref 98–111)
Creatinine: 1.27 mg/dL — ABNORMAL HIGH (ref 0.44–1.00)
GFR, Est AFR Am: 49 mL/min — ABNORMAL LOW (ref >60)
GFR, Estimated: 42 mL/min — ABNORMAL LOW (ref >60)
Glucose, Bld: 89 mg/dL (ref 70–99)
Potassium: 3.7 mmol/L (ref 3.5–5.1)
Sodium: 138 mmol/L (ref 135–145)
Total Bilirubin: 0.5 mg/dL (ref 0.3–1.2)
Total Protein: 6.3 g/dL — ABNORMAL LOW (ref 6.5–8.1)

## 2019-10-02 LAB — LACTATE DEHYDROGENASE: LDH: 185 U/L (ref 98–192)

## 2019-10-02 MED ORDER — ACETAMINOPHEN 325 MG PO TABS
ORAL_TABLET | ORAL | Status: AC
  Filled 2019-10-02: qty 2

## 2019-10-02 MED ORDER — DIPHENHYDRAMINE HCL 50 MG/ML IJ SOLN
INTRAMUSCULAR | Status: AC
  Filled 2019-10-02: qty 1

## 2019-10-02 MED ORDER — INFLUENZA VAC A&B SA ADJ QUAD 0.5 ML IM PRSY
0.5000 mL | PREFILLED_SYRINGE | Freq: Once | INTRAMUSCULAR | Status: AC
  Administered 2019-10-02: 0.5 mL via INTRAMUSCULAR

## 2019-10-02 MED ORDER — INFLUENZA VAC A&B SA ADJ QUAD 0.5 ML IM PRSY
PREFILLED_SYRINGE | INTRAMUSCULAR | Status: AC
  Filled 2019-10-02: qty 0.5

## 2019-10-02 NOTE — Progress Notes (Signed)
[facility] OFFICE PROGRESS NOTE  Patient Care Team: Velna Hatchet, MD as PCP - General (Internal Medicine) Fleet Contras, MD as Consulting Physician (Nephrology)  ASSESSMENT & PLAN:  Splenic marginal zone b-cell lymphoma [facility]) Her last imaging scan showed no evidence of lymphoma Her examination is benign Her blood work is within normal limits The patient is a long-term cancer survivor, almost 5 years away from her last treatment From the lymphoma standpoint, I will see her in 1 year for further follow-up  Chronic kidney disease, stage III (moderate) She has mild, intermittent elevated creatinine. She feels well and will continue to follow with nephrologist for medical management.  Preventive measure We discussed the importance of preventive care and reviewed the vaccination programs. She does not have any prior allergic reactions to influenza vaccination. She agrees to proceed with influenza vaccination today and we will administer it today at the clinic.   Weight loss Recent weight loss are likely precipitated by injury causing reduced physical activity and loss of muscle mass Since her injury has improved, I recommend graduated exercise as tolerated  Other fatigue I suspect this is due to deconditioning and carry over side effects from her sleep medications I recommend also consideration to reduce the use of trazodone and temazepam.   Orders Placed This Encounter  Procedures   Comprehensive metabolic panel    Standing Status:   Standing    Number of Occurrences:   33    Standing Expiration Date:   10/01/2020   CBC with Differential/Platelet    Standing Status:   Standing    Number of Occurrences:   22    Standing Expiration Date:   10/01/2020    All questions were answered. The patient knows to call the clinic with any problems, questions or concerns. The total time spent in the appointment was 20 minutes encounter with patients including review of  chart and various tests results, discussions about plan of care and coordination of care plan   Heath Lark, MD 10/02/2019 1:57 PM  INTERVAL HISTORY: Please see below for problem oriented charting. She returns with her husband for further follow-up No new lymphadenopathy No recent infection, fever or chills She complained of excessive fatigue despite sleeping for many hours at nighttime She is concerned about weight loss She hurt her right hamstring many months ago and have reduce activity since then Her muscle injury has resolved she has not returned back to exercise much  SUMMARY OF ONCOLOGIC HISTORY: Oncology History Overview Note  Non-Hodgkin lymphoma, marginal zone lymphoma   Primary site: Lymphoid Neoplasms   Staging method: AJCC 6th Edition   Clinical: Stage IV signed by Heath Lark, MD on 05/30/2013  8:47 AM   Summary: Stage IV   Splenic marginal zone b-cell lymphoma ([place])  02/23/2013 Imaging   CT scan show significant enlargement of liver and spleen   05/05/2013 Bone Marrow Biopsy   Bone marrow aspirate and biopsy confirmed low grade lymphoma, suspect marginal zone lymphoma   05/08/2013 Imaging   PET scan show involvement of liver and spleen   06/02/2013 - 06/23/2013 Chemotherapy   She was started on weekly rituximab.   09/04/2013 Imaging   PET CT scan show complete response to treatment with splenic size reduced back to normal limits. She has persistent mild thrombocytopenia.   12/06/2013 Imaging   PET scan was negative and spleen size is normal   06/06/2014 Imaging   PEt scan is negative   06/05/2015 Imaging   No findings  to suggest residual/recurrent splenic lymphoma   06/16/2016 Imaging   1. No enlarged lymph nodes or mass within the chest abdomen or pelvis to suggest residual/recurrent lymphoma. 2. Small pericardial effusion. Unchanged 3. Scar like densities noted within the right middle lobe status post pneumonia.   06/02/2017 Imaging   1. Stable CT of the  abdomen and pelvis. No mass or adenopathy to suggest residual/recurrent lymphoma. 2. Similar small pericardial effusion.   11/17/2018 Imaging   1. Normal size of the spleen. No abdominal or pelvic lymphadenopathy.   2. Fluid or soft tissue within the endometrial cavity, increased compared to prior examination and abnormal in the late postmenopausal setting. Recommend pelvic ultrasound further evaluate.   3.  Small pericardial effusion, unchanged.     REVIEW OF SYSTEMS:   Constitutional: Denies fevers, chills  Eyes: Denies blurriness of vision Ears, nose, mouth, throat, and face: Denies mucositis or sore throat Respiratory: Denies cough, dyspnea or wheezes Cardiovascular: Denies palpitation, chest discomfort or lower extremity swelling Gastrointestinal:  Denies nausea, heartburn or change in bowel habits Skin: Denies abnormal skin rashes Lymphatics: Denies new lymphadenopathy or easy bruising Neurological:Denies numbness, tingling or new weaknesses Behavioral/Psych: Mood is stable, no new changes  All other systems were reviewed with the patient and are negative.  I have reviewed the past medical history, past surgical history, social history and family history with the patient and they are unchanged from previous note.  ALLERGIES:  is allergic to biaxin [clarithromycin], erythromycin, soy allergy, zithromax [azithromycin], and zofran [ondansetron hcl].  MEDICATIONS:  Current Outpatient Medications  Medication Sig Dispense Refill   augmented betamethasone dipropionate (DIPROLENE-AF) 0.05 % cream APPLY TO AFFECTED AREA(S) UP TO TWICE DAILY AS NEEDED. NOT TO FACE GROIN OR UNDERARMS     cholecalciferol (VITAMIN D) 1000 units tablet Take 1,000 Units by mouth daily. Reported on 07/01/2015     COLLAGEN PO Take by mouth in the morning and at bedtime.     estradiol (ESTRACE) 1 MG tablet Take 1 mg by mouth daily.     Linaclotide (LINZESS) 290 MCG CAPS capsule Take 1 capsule (290 mcg  total) by mouth every morning. (Patient taking differently: Take 290 mcg by mouth as needed. ) 30 capsule 5   Magnesium Hydroxide (DULCOLAX PO) Take by mouth.     metoCLOPramide (REGLAN) 5 MG tablet TAKE 1 TABLET BY MOUTH 2 (TWO) TIMES DAILY AT 10 AM AND 4 PM. (Patient taking differently: Take 5 mg by mouth 2 (two) times daily. ) 180 tablet 1   pantoprazole (PROTONIX) 40 MG tablet Take 1 tablet (40 mg total) by mouth daily. 30 tablet 11   progesterone (PROMETRIUM) 100 MG capsule Take 100 mg by mouth at bedtime.      temazepam (RESTORIL) 30 MG capsule Take 2 capsules (60 mg total) by mouth at bedtime. 60 capsule 1   traZODone (DESYREL) 50 MG tablet Take 50 mg by mouth at bedtime.     Current Facility-Administered Medications  Medication Dose Route Frequency Provider Last Rate Last Admin   influenza vaccine adjuvanted (FLUAD) injection 0.5 mL  0.5 mL Intramuscular Once Alvy Bimler, Laaibah Wartman, MD        PHYSICAL EXAMINATION: ECOG PERFORMANCE STATUS: 1 - Symptomatic but completely ambulatory  Vitals:   10/02/19 1315  BP: 121/70  Pulse: 75  Resp: 17  Temp: (!) 96.3 F (35.7 C)  SpO2: 100%   Filed Weights   10/02/19 1315  Weight: 133 lb 12.8 oz (60.7 kg)    GENERAL:alert, no distress  and comfortable SKIN: skin color, texture, turgor are normal, no rashes or significant lesions EYES: normal, Conjunctiva are pink and non-injected, sclera clear OROPHARYNX:no exudate, no erythema and lips, buccal mucosa, and tongue normal  NECK: supple, thyroid normal size, non-tender, without nodularity LYMPH:  no palpable lymphadenopathy in the cervical, axillary or inguinal LUNGS: clear to auscultation and percussion with normal breathing effort HEART: regular rate & rhythm and no murmurs and no lower extremity edema ABDOMEN:abdomen soft, non-tender and normal bowel sounds Musculoskeletal:no cyanosis of digits and no clubbing  NEURO: alert & oriented x 3 with fluent speech, no focal motor/sensory  deficits  LABORATORY DATA:  I have reviewed the data as listed    Component Value Date/Time   NA 138 10/02/2019 1257   NA 128 (L) 12/03/2016 1445   K 3.7 10/02/2019 1257   K 4.2 12/03/2016 1445   CL 102 10/02/2019 1257   CO2 28 10/02/2019 1257   CO2 27 12/03/2016 1445   GLUCOSE 89 10/02/2019 1257   GLUCOSE 96 12/03/2016 1445   BUN 15 10/02/2019 1257   BUN 11.4 12/03/2016 1445   CREATININE 1.27 (H) 10/02/2019 1257   CREATININE 1.1 12/03/2016 1445   CALCIUM 9.4 10/02/2019 1257   CALCIUM 9.4 12/03/2016 1445   PROT 6.3 (L) 10/02/2019 1257   PROT 6.3 (L) 12/03/2016 1445   ALBUMIN 4.2 10/02/2019 1257   ALBUMIN 4.3 12/03/2016 1445   AST 18 10/02/2019 1257   AST 17 12/03/2016 1445   ALT 14 10/02/2019 1257   ALT 12 12/03/2016 1445   ALKPHOS 115 10/02/2019 1257   ALKPHOS 126 12/03/2016 1445   BILITOT 0.5 10/02/2019 1257   BILITOT 0.50 12/03/2016 1445   GFRNONAA 42 (L) 10/02/2019 1257   GFRAA 49 (L) 10/02/2019 1257    No results found for: SPEP, UPEP  Lab Results  Component Value Date   WBC 5.7 10/02/2019   NEUTROABS 3.4 10/02/2019   HGB 13.1 10/02/2019   HCT 38.9 10/02/2019   MCV 92.6 10/02/2019   PLT 154 10/02/2019      Chemistry      Component Value Date/Time   NA 138 10/02/2019 1257   NA 128 (L) 12/03/2016 1445   K 3.7 10/02/2019 1257   K 4.2 12/03/2016 1445   CL 102 10/02/2019 1257   CO2 28 10/02/2019 1257   CO2 27 12/03/2016 1445   BUN 15 10/02/2019 1257   BUN 11.4 12/03/2016 1445   CREATININE 1.27 (H) 10/02/2019 1257   CREATININE 1.1 12/03/2016 1445      Component Value Date/Time   CALCIUM 9.4 10/02/2019 1257   CALCIUM 9.4 12/03/2016 1445   ALKPHOS 115 10/02/2019 1257   ALKPHOS 126 12/03/2016 1445   AST 18 10/02/2019 1257   AST 17 12/03/2016 1445   ALT 14 10/02/2019 1257   ALT 12 12/03/2016 1445   BILITOT 0.5 10/02/2019 1257   BILITOT 0.50 12/03/2016 1445

## 2019-10-02 NOTE — Assessment & Plan Note (Signed)
Recent weight loss are likely precipitated by injury causing reduced physical activity and loss of muscle mass Since her injury has improved, I recommend graduated exercise as tolerated

## 2019-10-02 NOTE — Assessment & Plan Note (Signed)
I suspect this is due to deconditioning and carry over side effects from her sleep medications I recommend also consideration to reduce the use of trazodone and temazepam.

## 2019-10-02 NOTE — Assessment & Plan Note (Signed)
Her last imaging scan showed no evidence of lymphoma Her examination is benign Her blood work is within normal limits The patient is a long-term cancer survivor, almost 5 years away from her last treatment From the lymphoma standpoint, I will see her in 1 year for further follow-up

## 2019-10-02 NOTE — Assessment & Plan Note (Signed)
She has mild, intermittent elevated creatinine. She feels well and will continue to follow with nephrologist for medical management.

## 2019-10-02 NOTE — Assessment & Plan Note (Signed)
We discussed the importance of preventive care and reviewed the vaccination programs. She does not have any prior allergic reactions to influenza vaccination. She agrees to proceed with influenza vaccination today and we will administer it today at the clinic.  

## 2019-11-14 ENCOUNTER — Encounter (INDEPENDENT_AMBULATORY_CARE_PROVIDER_SITE_OTHER): Payer: Self-pay | Admitting: Otolaryngology

## 2019-11-14 ENCOUNTER — Ambulatory Visit (INDEPENDENT_AMBULATORY_CARE_PROVIDER_SITE_OTHER): Payer: Medicare Other | Admitting: Otolaryngology

## 2019-11-14 ENCOUNTER — Other Ambulatory Visit: Payer: Self-pay

## 2019-11-14 VITALS — Temp 96.8°F

## 2019-11-14 DIAGNOSIS — H6121 Impacted cerumen, right ear: Secondary | ICD-10-CM

## 2019-11-14 NOTE — Progress Notes (Signed)
HPI: Shannon Barajas is a 69 y.o. female who presents is referred by her PCP for evaluation of cerumen buildup in the right ear.  Apparently they were able to clean the left ear but cannot remove the wax from the right ear in the office.  She has always had a little bit better hearing on the right side compared to the left.  Past Medical History:  Diagnosis Date   Allergy    SEASONAL   Anemia    Arthritis    Cataract    BILATERAL   Chronic interstitial cystitis    Chronic kidney disease (CKD), stage III (moderate) ([place]) 05/25/2014   nephrology dr Clover Mealy q year   GERD (gastroesophageal reflux disease)    H/O: iron deficiency anemia    Hemorrhoids    Hemorrhoids 07/26/2013   Hiatal hernia    small    IBS (irritable bowel syndrome)    Insomnia, unspecified    Interstitial cystitis    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[facility]) 11/14/2013   Non Hodgkin's lymphoma ([place]) 04/19/2013   Osteoporosis    PONV (postoperative nausea and vomiting)    likes phenergan   Raynaud's syndrome    Splenomegaly    Stricture and stenosis of esophagus    Thrombocytopenia, unspecified ([place])    Unspecified constipation    Past Surgical History:  Procedure Laterality Date   APPENDECTOMY  2012   APPENDECTOMY  2012   BALLOON DILATION N/A 09/06/2014   Procedure: Larrie Kass DILATION;  Surgeon: [name], MD;  Location: [facility];  Service: Endoscopy;  Laterality: N/A;   BREAST LUMPECTOMY Left    DILATION AND CURETTAGE OF UTERUS  yrs ago   ESOPHAGEAL DILATION     ESOPHAGOGASTRODUODENOSCOPY (EGD) WITH PROPOFOL N/A 09/06/2014   Procedure: ESOPHAGOGASTRODUODENOSCOPY (EGD) WITH PROPOFOL;  Surgeon: [name], MD;  Location: [facility];  Service: Endoscopy;  Laterality: N/A;   HYSTEROSCOPY WITH D & C N/A 12/08/2018   Procedure: DILATATION AND CURETTAGE /HYSTEROSCOPY;  Surgeon: Dian Queen, MD;  Location: [facility];  Service: Gynecology;  Laterality:  N/A;   TONSILLECTOMY     TUBAL LIGATION     VIDEO BRONCHOSCOPY Bilateral 08/23/2015   Procedure: VIDEO BRONCHOSCOPY WITH FLUORO;  Surgeon: Javier Glazier, MD;  Location: [facility];  Service: Cardiopulmonary;  Laterality: Bilateral;   Social History   Socioeconomic History   Marital status: Married    Spouse name: Not on file   Number of children: 2   Years of education: Not on file   Highest education level: Not on file  Occupational History   Occupation: retired  Tobacco Use   Smoking status: Passive Smoke Exposure - Never Smoker   Smokeless tobacco: Never Used   Tobacco comment: Father growing up & daughter currently  Scientific laboratory technician Use: Never used  Substance and Sexual Activity   Alcohol use: No   Drug use: No   Sexual activity: Not on file  Other Topics Concern   Not on file  Social History Narrative   Originally from [place]. She moved to [place] in the 1980s. She is a Orthoptist. She has also traveled to [place], [place], & [place]. No pets currently. No bird exposure. 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 Enjoys spending time with family & reading.    Social Determinants of Health   Financial Resource Strain:    Difficulty of Paying Living Expenses: Not on file  Food Insecurity:  Worried About Charity fundraiser in the Last Year: Not on file   [employer] of Food in the Last Year: Not on file  Transportation Needs:    Lack of Transportation (Medical): Not on file   Lack of Transportation (Non-Medical): Not on file  Physical Activity:    Days of Exercise per Week: Not on file   Minutes of Exercise per Session: Not on file  Stress:    Feeling of Stress : Not on file  Social Connections:    Frequency of Communication with Friends and Family: Not on file   Frequency of Social Gatherings with Friends and Family: Not on file   Attends Religious Services: Not on file   Active Member of Clubs or  Organizations: Not on file   Attends Archivist Meetings: Not on file   Marital Status: Not on file   Family History  Problem Relation Age of Onset   Lymphoma Father        lymphoma   Cirrhosis Father        heavy drinker   Diabetes Maternal Grandmother    Colon cancer Maternal Grandfather    Cancer Maternal Grandfather        liver ca ?   Esophageal cancer Neg Hx    Rectal cancer Neg Hx    Stomach cancer Neg Hx    Allergies  Allergen Reactions   Biaxin [Clarithromycin] Other (See Comments)    REACTION: aggrevates IBS   Erythromycin     REACTION: aggrevates IBS   Soy Allergy Other (See Comments)    May arregevate interstitial cystitis   Zithromax [Azithromycin]     Upset stomach   Zofran [Ondansetron Hcl]     Daughter had anaphlaxis from, patient does not take   Prior to Admission medications   Medication Sig Start Date End Date Taking? Authorizing Provider  augmented betamethasone dipropionate (DIPROLENE-AF) 0.05 % cream APPLY TO AFFECTED AREA(S) UP TO TWICE DAILY AS NEEDED. NOT TO FACE GROIN OR UNDERARMS 05/24/19  Yes [provider]  cholecalciferol (VITAMIN D) 1000 units tablet Take 1,000 Units by mouth daily. Reported on 07/01/2015   Yes [provider]  COLLAGEN PO Take by mouth in the morning and at bedtime.   Yes [provider]  estradiol (ESTRACE) 1 MG tablet Take 1 mg by mouth daily. 01/27/18  Yes [provider]  Linaclotide (LINZESS) 290 MCG CAPS capsule Take 1 capsule (290 mcg total) by mouth every morning. Patient taking differently: Take 290 mcg by mouth as needed.  07/14/13  Yes Ricard Dillon, MD  Magnesium Hydroxide (DULCOLAX PO) Take by mouth.   Yes [provider]  metoCLOPramide (REGLAN) 5 MG tablet TAKE 1 TABLET BY MOUTH 2 (TWO) TIMES DAILY AT 10 AM AND 4 PM. Patient taking differently: Take 5 mg by mouth 2 (two) times daily.    Yes Ricard Dillon, MD  pantoprazole (PROTONIX) 40 MG  tablet Take 1 tablet (40 mg total) by mouth daily. 05/16/19  Yes [name], MD  progesterone (PROMETRIUM) 100 MG capsule Take 100 mg by mouth at bedtime.  04/27/13  Yes [provider]  temazepam (RESTORIL) 30 MG capsule Take 2 capsules (60 mg total) by mouth at bedtime. 12/18/13  Yes Hoyt Koch, MD  traZODone (DESYREL) 50 MG tablet Take 50 mg by mouth at bedtime.   Yes [provider]     Positive ROS: Otherwise negative  All other systems have been reviewed and  were otherwise negative with the exception of those mentioned in the HPI and as above.  Physical Exam: Constitutional: Alert, well-appearing, no acute distress Ears: External ears without lesions or tenderness.  Left ear canal minimal cerumen buildup that was removed with a curette and the TM was clear.  Right ear canal had a large hard block of cerumen that was removed with curette and forceps as well as suction.  The right TM is clear.  Hearing screening with the 1024 tuning fork revealed slightly better hearing on the right side compared to the left. Nasal: External nose without lesions.. Clear nasal passages Oral: Lips and gums without lesions. Tongue and palate mucosa without lesions. Posterior oropharynx clear. Neck: No palpable adenopathy or masses Respiratory: Breathing comfortably  Skin: No facial/neck lesions or rash noted.  Cerumen impaction removal  Date/Time: 11/14/2019 1:58 PM Performed by: Rozetta Nunnery, MD Authorized by: Rozetta Nunnery, MD   Consent:    Consent obtained:  Verbal   Consent given by:  Patient   Risks discussed:  Pain and bleeding Procedure details:    Location:  R ear   Procedure type: curette, suction and forceps   Post-procedure details:    Inspection:  TM intact and canal normal   Hearing quality:  Improved   Patient tolerance of procedure:  Tolerated well, no immediate complications Comments:     TMs are clear  bilaterally.    Assessment: Right ear cerumen buildup. Slight left ear SNHL  Plan: She will follow-up as needed   Radene Journey, MD   CC:

## 2019-11-24 ENCOUNTER — Telehealth: Payer: Self-pay

## 2019-11-24 NOTE — Telephone Encounter (Signed)
Called and scheduled appt for Monday 11/8. She is aware of appt times.

## 2019-11-27 ENCOUNTER — Encounter: Payer: Self-pay | Admitting: Hematology and Oncology

## 2019-11-27 ENCOUNTER — Inpatient Hospital Stay (HOSPITAL_BASED_OUTPATIENT_CLINIC_OR_DEPARTMENT_OTHER): Payer: Medicare Other | Admitting: Hematology and Oncology

## 2019-11-27 ENCOUNTER — Inpatient Hospital Stay: Payer: Medicare Other | Attending: Hematology and Oncology

## 2019-11-27 ENCOUNTER — Other Ambulatory Visit: Payer: Self-pay

## 2019-11-27 VITALS — BP 116/53 | HR 70 | Temp 97.1°F | Resp 17 | Ht 68.0 in | Wt 135.0 lb

## 2019-11-27 DIAGNOSIS — D696 Thrombocytopenia, unspecified: Secondary | ICD-10-CM | POA: Insufficient documentation

## 2019-11-27 DIAGNOSIS — R1084 Generalized abdominal pain: Secondary | ICD-10-CM | POA: Diagnosis not present

## 2019-11-27 DIAGNOSIS — Z8572 Personal history of non-Hodgkin lymphomas: Secondary | ICD-10-CM | POA: Diagnosis not present

## 2019-11-27 DIAGNOSIS — E871 Hypo-osmolality and hyponatremia: Secondary | ICD-10-CM

## 2019-11-27 DIAGNOSIS — C8307 Small cell B-cell lymphoma, spleen: Secondary | ICD-10-CM

## 2019-11-27 DIAGNOSIS — K5909 Other constipation: Secondary | ICD-10-CM

## 2019-11-27 LAB — CBC WITH DIFFERENTIAL/PLATELET
Abs Immature Granulocytes: 0.01 10*3/uL (ref 0.00–0.07)
Basophils Absolute: 0 10*3/uL (ref 0.0–0.1)
Basophils Relative: 1 %
Eosinophils Absolute: 0.2 10*3/uL (ref 0.0–0.5)
Eosinophils Relative: 3 %
HCT: 36.4 % (ref 36.0–46.0)
Hemoglobin: 12.5 g/dL (ref 12.0–15.0)
Immature Granulocytes: 0 %
Lymphocytes Relative: 27 %
Lymphs Abs: 1.5 10*3/uL (ref 0.7–4.0)
MCH: 30.9 pg (ref 26.0–34.0)
MCHC: 34.3 g/dL (ref 30.0–36.0)
MCV: 90.1 fL (ref 80.0–100.0)
Monocytes Absolute: 0.5 10*3/uL (ref 0.1–1.0)
Monocytes Relative: 9 %
Neutro Abs: 3.4 10*3/uL (ref 1.7–7.7)
Neutrophils Relative %: 60 %
Platelets: 134 10*3/uL — ABNORMAL LOW (ref 150–400)
RBC: 4.04 MIL/uL (ref 3.87–5.11)
RDW: 11.9 % (ref 11.5–15.5)
WBC: 5.6 10*3/uL (ref 4.0–10.5)
nRBC: 0 % (ref 0.0–0.2)

## 2019-11-27 LAB — COMPREHENSIVE METABOLIC PANEL
ALT: 14 U/L (ref 0–44)
AST: 18 U/L (ref 15–41)
Albumin: 4 g/dL (ref 3.5–5.0)
Alkaline Phosphatase: 123 U/L (ref 38–126)
Anion gap: 8 (ref 5–15)
BUN: 13 mg/dL (ref 8–23)
CO2: 27 mmol/L (ref 22–32)
Calcium: 9.3 mg/dL (ref 8.9–10.3)
Chloride: 95 mmol/L — ABNORMAL LOW (ref 98–111)
Creatinine, Ser: 1.1 mg/dL — ABNORMAL HIGH (ref 0.44–1.00)
GFR, Estimated: 54 mL/min — ABNORMAL LOW (ref >60)
Glucose, Bld: 98 mg/dL (ref 70–99)
Potassium: 4.4 mmol/L (ref 3.5–5.1)
Sodium: 130 mmol/L — ABNORMAL LOW (ref 135–145)
Total Bilirubin: 0.6 mg/dL (ref 0.3–1.2)
Total Protein: 5.9 g/dL — ABNORMAL LOW (ref 6.5–8.1)

## 2019-11-27 NOTE — Assessment & Plan Note (Signed)
She has new abdominal distention and pain on exam Certainly, I would be concerned about lymphoma recurrence I recommend CT imaging with chest, abdomen and pelvis for further evaluation

## 2019-11-27 NOTE — Progress Notes (Signed)
[place] OFFICE PROGRESS NOTE  Patient Care Team: Velna Hatchet, MD as PCP - General (Internal Medicine) Fleet Contras, MD as Consulting Physician (Nephrology)  ASSESSMENT & PLAN:  Splenic marginal zone b-cell lymphoma [facility]) She has new abdominal distention and pain on exam Certainly, I would be concerned about lymphoma recurrence I recommend CT imaging with chest, abdomen and pelvis for further evaluation  Thrombocytopenia The cause is unknown but could be due to prior treatment. The patient denies recent history of bleeding such as epistaxis, hematuria or hematochezia up from intermittent hemorrhoidal bleeding. She is asymptomatic from the thrombocytopenia. I will observe for now.    Generalized abdominal pain She has none discrete, generalized abdominal pain especially on the lower part of her abdomen She had colonoscopy 6 months ago that was unremarkable As above, I recommend CT imaging for further evaluation  Other constipation She has history of irritable bowel syndrome and takes Linzess for regular bowel habits She will continue taking laxative and Linzess as needed   Orders Placed This Encounter  Procedures  . CT CHEST ABDOMEN PELVIS W CONTRAST    Standing Status:   Future    Standing Expiration Date:   11/26/2020    Order Specific Question:   Preferred imaging location?    Answer:   [facility]    Order Specific Question:   Radiology Contrast Protocol - do NOT remove file path    Answer:   [email or web address]    All questions were answered. The patient knows to call the clinic with any problems, questions or concerns. The total time spent in the appointment was 30 minutes encounter with patients including review of chart and various tests results, discussions about plan of care and coordination of care plan   Heath Lark, MD 11/27/2019 1:49 PM  INTERVAL HISTORY: Please see below for problem  oriented charting. She returns for urgent evaluation with her husband today I saw her 2 months ago Since then, in the past 3 weeks, she started to develop progressive abdominal swelling and distention She also have intermittent abdominal pain.  It felt worse after eating.  She has chronic constipation taking Linzess for regular bowel movement.  She is not alleviated by bowel movement She has significant bloating and belching No changes to her urination Denies abnormal vaginal bleeding She denies new palpable lymphadenopathy No recent weight loss  SUMMARY OF ONCOLOGIC HISTORY: Oncology History Overview Note  Non-Hodgkin lymphoma, marginal zone lymphoma   Primary site: Lymphoid Neoplasms   Staging method: AJCC 6th Edition   Clinical: Stage IV signed by Heath Lark, MD on 05/30/2013  8:47 AM   Summary: Stage IV   Splenic marginal zone b-cell lymphoma ([place])  02/23/2013 Imaging   CT scan show significant enlargement of liver and spleen   05/05/2013 Bone Marrow Biopsy   Bone marrow aspirate and biopsy confirmed low grade lymphoma, suspect marginal zone lymphoma   05/08/2013 Imaging   PET scan show involvement of liver and spleen   06/02/2013 - 06/23/2013 Chemotherapy   She was started on weekly rituximab.   09/04/2013 Imaging   PET CT scan show complete response to treatment with splenic size reduced back to normal limits. She has persistent mild thrombocytopenia.   12/06/2013 Imaging   PET scan was negative and spleen size is normal   06/06/2014 Imaging   PEt scan is negative   06/05/2015 Imaging   No findings to suggest residual/recurrent splenic lymphoma   06/16/2016 Imaging   1. No enlarged  lymph nodes or mass within the chest abdomen or pelvis to suggest residual/recurrent lymphoma. 2. Small pericardial effusion. Unchanged 3. Scar like densities noted within the right middle lobe status post pneumonia.   06/02/2017 Imaging   1. Stable CT of the abdomen and pelvis. No mass or  adenopathy to suggest residual/recurrent lymphoma. 2. Similar small pericardial effusion.   11/17/2018 Imaging   1. Normal size of the spleen. No abdominal or pelvic lymphadenopathy.   2. Fluid or soft tissue within the endometrial cavity, increased compared to prior examination and abnormal in the late postmenopausal setting. Recommend pelvic ultrasound further evaluate.   3.  Small pericardial effusion, unchanged.     REVIEW OF SYSTEMS:   Constitutional: Denies fevers, chills or abnormal weight loss Eyes: Denies blurriness of vision Ears, nose, mouth, throat, and face: Denies mucositis or sore throat Respiratory: Denies cough, dyspnea or wheezes Cardiovascular: Denies palpitation, chest discomfort or lower extremity swelling Skin: Denies abnormal skin rashes Lymphatics: Denies new lymphadenopathy or easy bruising Neurological:Denies numbness, tingling or new weaknesses Behavioral/Psych: Mood is stable, no new changes  All other systems were reviewed with the patient and are negative.  I have reviewed the past medical history, past surgical history, social history and family history with the patient and they are unchanged from previous note.  ALLERGIES:  is allergic to biaxin [clarithromycin], erythromycin, soy allergy, zithromax [azithromycin], and zofran [ondansetron hcl].  MEDICATIONS:  Current Outpatient Medications  Medication Sig Dispense Refill  . augmented betamethasone dipropionate (DIPROLENE-AF) 0.05 % cream APPLY TO AFFECTED AREA(S) UP TO TWICE DAILY AS NEEDED. NOT TO FACE GROIN OR UNDERARMS    . cholecalciferol (VITAMIN D) 1000 units tablet Take 1,000 Units by mouth daily. Reported on 07/01/2015    . COLLAGEN PO Take by mouth in the morning and at bedtime.    Marland Kitchen estradiol (ESTRACE) 1 MG tablet Take 1 mg by mouth daily.    . Linaclotide (LINZESS) 290 MCG CAPS capsule Take 1 capsule (290 mcg total) by mouth every morning. (Patient taking differently: Take 290 mcg by mouth  as needed. ) 30 capsule 5  . Magnesium Hydroxide (DULCOLAX PO) Take by mouth.    . metoCLOPramide (REGLAN) 5 MG tablet TAKE 1 TABLET BY MOUTH 2 (TWO) TIMES DAILY AT 10 AM AND 4 PM. (Patient taking differently: Take 5 mg by mouth 2 (two) times daily. ) 180 tablet 1  . pantoprazole (PROTONIX) 40 MG tablet Take 1 tablet (40 mg total) by mouth daily. 30 tablet 11  . progesterone (PROMETRIUM) 100 MG capsule Take 100 mg by mouth at bedtime.     . temazepam (RESTORIL) 30 MG capsule Take 2 capsules (60 mg total) by mouth at bedtime. 60 capsule 1  . traZODone (DESYREL) 50 MG tablet Take 50 mg by mouth at bedtime.     No current facility-administered medications for this visit.    PHYSICAL EXAMINATION: ECOG PERFORMANCE STATUS: 1 - Symptomatic but completely ambulatory  Vitals:   11/27/19 1321  BP: (!) 116/53  Pulse: 70  Resp: 17  Temp: (!) 97.1 F (36.2 C)  SpO2: 100%   Filed Weights   11/27/19 1321  Weight: 135 lb (61.2 kg)    GENERAL:alert, no distress and comfortable SKIN: skin color, texture, turgor are normal, no rashes or significant lesions EYES: normal, Conjunctiva are pink and non-injected, sclera clear OROPHARYNX:no exudate, no erythema and lips, buccal mucosa, and tongue normal  NECK: supple, thyroid normal size, non-tender, without nodularity LYMPH:  no palpable lymphadenopathy in  the cervical, axillary or inguinal LUNGS: clear to auscultation and percussion with normal breathing effort HEART: regular rate & rhythm and no murmurs and no lower extremity edema ABDOMEN:abdomen soft, noted visible abdominal distention.  Discomfort on palpation on the lower quadrant of her abdomen without rebound or guarding  musculoskeletal:no cyanosis of digits and no clubbing  NEURO: alert & oriented x 3 with fluent speech, no focal motor/sensory deficits  LABORATORY DATA:  I have reviewed the data as listed    Component Value Date/Time   NA 138 10/02/2019 1257   NA 128 (L) 12/03/2016  1445   K 3.7 10/02/2019 1257   K 4.2 12/03/2016 1445   CL 102 10/02/2019 1257   CO2 28 10/02/2019 1257   CO2 27 12/03/2016 1445   GLUCOSE 89 10/02/2019 1257   GLUCOSE 96 12/03/2016 1445   BUN 15 10/02/2019 1257   BUN 11.4 12/03/2016 1445   CREATININE 1.27 (H) 10/02/2019 1257   CREATININE 1.1 12/03/2016 1445   CALCIUM 9.4 10/02/2019 1257   CALCIUM 9.4 12/03/2016 1445   PROT 6.3 (L) 10/02/2019 1257   PROT 6.3 (L) 12/03/2016 1445   ALBUMIN 4.2 10/02/2019 1257   ALBUMIN 4.3 12/03/2016 1445   AST 18 10/02/2019 1257   AST 17 12/03/2016 1445   ALT 14 10/02/2019 1257   ALT 12 12/03/2016 1445   ALKPHOS 115 10/02/2019 1257   ALKPHOS 126 12/03/2016 1445   BILITOT 0.5 10/02/2019 1257   BILITOT 0.50 12/03/2016 1445   GFRNONAA 42 (L) 10/02/2019 1257   GFRAA 49 (L) 10/02/2019 1257    No results found for: SPEP, UPEP  Lab Results  Component Value Date   WBC 5.6 11/27/2019   NEUTROABS 3.4 11/27/2019   HGB 12.5 11/27/2019   HCT 36.4 11/27/2019   MCV 90.1 11/27/2019   PLT 134 (L) 11/27/2019      Chemistry      Component Value Date/Time   NA 138 10/02/2019 1257   NA 128 (L) 12/03/2016 1445   K 3.7 10/02/2019 1257   K 4.2 12/03/2016 1445   CL 102 10/02/2019 1257   CO2 28 10/02/2019 1257   CO2 27 12/03/2016 1445   BUN 15 10/02/2019 1257   BUN 11.4 12/03/2016 1445   CREATININE 1.27 (H) 10/02/2019 1257   CREATININE 1.1 12/03/2016 1445      Component Value Date/Time   CALCIUM 9.4 10/02/2019 1257   CALCIUM 9.4 12/03/2016 1445   ALKPHOS 115 10/02/2019 1257   ALKPHOS 126 12/03/2016 1445   AST 18 10/02/2019 1257   AST 17 12/03/2016 1445   ALT 14 10/02/2019 1257   ALT 12 12/03/2016 1445   BILITOT 0.5 10/02/2019 1257   BILITOT 0.50 12/03/2016 1445

## 2019-11-27 NOTE — Assessment & Plan Note (Signed)
She has none discrete, generalized abdominal pain especially on the lower part of her abdomen She had colonoscopy 6 months ago that was unremarkable As above, I recommend CT imaging for further evaluation

## 2019-11-27 NOTE — Assessment & Plan Note (Signed)
The cause is unknown but could be due to prior treatment. The patient denies recent history of bleeding such as epistaxis, hematuria or hematochezia up from intermittent hemorrhoidal bleeding. She is asymptomatic from the thrombocytopenia. I will observe for now.

## 2019-11-27 NOTE — Assessment & Plan Note (Signed)
She has history of irritable bowel syndrome and takes Linzess for regular bowel habits She will continue taking laxative and Linzess as needed

## 2019-12-04 ENCOUNTER — Encounter (HOSPITAL_COMMUNITY): Payer: Self-pay

## 2019-12-04 ENCOUNTER — Other Ambulatory Visit: Payer: Self-pay

## 2019-12-04 ENCOUNTER — Ambulatory Visit (HOSPITAL_COMMUNITY)
Admission: RE | Admit: 2019-12-04 | Discharge: 2019-12-04 | Disposition: A | Discharge status: Home / Self Care | Payer: Medicare Other | Source: Ambulatory Visit | Attending: Hematology and Oncology | Admitting: Hematology and Oncology

## 2019-12-04 DIAGNOSIS — R1084 Generalized abdominal pain: Secondary | ICD-10-CM | POA: Insufficient documentation

## 2019-12-04 DIAGNOSIS — C8307 Small cell B-cell lymphoma, spleen: Secondary | ICD-10-CM | POA: Diagnosis not present

## 2019-12-04 MED ORDER — IOHEXOL 300 MG/ML  SOLN
100.0000 mL | Freq: Once | INTRAMUSCULAR | Status: AC | PRN
  Administered 2019-12-04: 100 mL via INTRAVENOUS

## 2019-12-05 ENCOUNTER — Encounter: Payer: Self-pay | Admitting: Hematology and Oncology

## 2019-12-05 ENCOUNTER — Inpatient Hospital Stay (HOSPITAL_BASED_OUTPATIENT_CLINIC_OR_DEPARTMENT_OTHER): Payer: Medicare Other | Admitting: Hematology and Oncology

## 2019-12-05 ENCOUNTER — Other Ambulatory Visit: Payer: Self-pay

## 2019-12-05 DIAGNOSIS — R918 Other nonspecific abnormal finding of lung field: Secondary | ICD-10-CM | POA: Diagnosis not present

## 2019-12-05 DIAGNOSIS — C8307 Small cell B-cell lymphoma, spleen: Secondary | ICD-10-CM

## 2019-12-05 DIAGNOSIS — R140 Abdominal distension (gaseous): Secondary | ICD-10-CM | POA: Diagnosis not present

## 2019-12-05 NOTE — Assessment & Plan Note (Signed)
I have reviewed multiple CT imaging with the patient and her husband There is no signs of cancer recurrence She is reassured

## 2019-12-05 NOTE — Assessment & Plan Note (Signed)
I was not able to appreciate any persistent pulmonary nodules on her recent CT scan of the chest She does not need further future imaging in this regard

## 2019-12-05 NOTE — Progress Notes (Signed)
[place] OFFICE PROGRESS NOTE  Patient Care Team: Velna Hatchet, MD as PCP - General (Internal Medicine) Fleet Contras, MD as Consulting Physician (Nephrology)  ASSESSMENT & PLAN:  Splenic marginal zone b-cell lymphoma [facility]) I have reviewed multiple CT imaging with the patient and her husband There is no signs of cancer recurrence She is reassured  Multiple lung nodules on CT I was not able to appreciate any persistent pulmonary nodules on her recent CT scan of the chest She does not need further future imaging in this regard  Abdominal bloating I suspect her abdominal bloating is due to disordered GI motility She was prescribed Linzess by her gastroenterologist but she is not taking it consistently for fear of having diarrhea when she visits her mother Her recent EGD and colonoscopy were within normal limits I encouraged her to continue metoclopramide before meals and to take Linzess on a regular basis if possible At this point in time, I do not believe she needs further work-up   No orders of the defined types were placed in this encounter.   All questions were answered. The patient knows to call the clinic with any problems, questions or concerns. The total time spent in the appointment was 20 minutes encounter with patients including review of chart and various tests results, discussions about plan of care and coordination of care plan   Heath Lark, MD 12/05/2019 2:28 PM  INTERVAL HISTORY: Please see below for problem oriented charting. She returns with her husband to review test results She felt better since her last visit Denies abdominal pain  SUMMARY OF ONCOLOGIC HISTORY: Oncology History Overview Note  Non-Hodgkin lymphoma, marginal zone lymphoma   Primary site: Lymphoid Neoplasms   Staging method: AJCC 6th Edition   Clinical: Stage IV signed by Heath Lark, MD on 05/30/2013  8:47 AM   Summary: Stage IV   Splenic marginal zone b-cell  lymphoma ([place])  02/23/2013 Imaging   CT scan show significant enlargement of liver and spleen   05/05/2013 Bone Marrow Biopsy   Bone marrow aspirate and biopsy confirmed low grade lymphoma, suspect marginal zone lymphoma   05/08/2013 Imaging   PET scan show involvement of liver and spleen   06/02/2013 - 06/23/2013 Chemotherapy   She was started on weekly rituximab.   09/04/2013 Imaging   PET CT scan show complete response to treatment with splenic size reduced back to normal limits. She has persistent mild thrombocytopenia.   12/06/2013 Imaging   PET scan was negative and spleen size is normal   06/06/2014 Imaging   PEt scan is negative   06/05/2015 Imaging   No findings to suggest residual/recurrent splenic lymphoma   06/16/2016 Imaging   1. No enlarged lymph nodes or mass within the chest abdomen or pelvis to suggest residual/recurrent lymphoma. 2. Small pericardial effusion. Unchanged 3. Scar like densities noted within the right middle lobe status post pneumonia.   06/02/2017 Imaging   1. Stable CT of the abdomen and pelvis. No mass or adenopathy to suggest residual/recurrent lymphoma. 2. Similar small pericardial effusion.   11/17/2018 Imaging   1. Normal size of the spleen. No abdominal or pelvic lymphadenopathy.   2. Fluid or soft tissue within the endometrial cavity, increased compared to prior examination and abnormal in the late postmenopausal setting. Recommend pelvic ultrasound further evaluate.   3.  Small pericardial effusion, unchanged.   12/04/2019 Imaging   1. No evidence of recurrent lymphoma. No findings to explain the patient's given clinical history. 2.  Similar small pericardial effusion. 3. Hepatomegaly.     REVIEW OF SYSTEMS:   Constitutional: Denies fevers, chills or abnormal weight loss Eyes: Denies blurriness of vision Ears, nose, mouth, throat, and face: Denies mucositis or sore throat Respiratory: Denies cough, dyspnea or wheezes Cardiovascular:  Denies palpitation, chest discomfort or lower extremity swelling Gastrointestinal:  Denies nausea, heartburn or change in bowel habits Skin: Denies abnormal skin rashes Lymphatics: Denies new lymphadenopathy or easy bruising Neurological:Denies numbness, tingling or new weaknesses Behavioral/Psych: Mood is stable, no new changes  All other systems were reviewed with the patient and are negative.  I have reviewed the past medical history, past surgical history, social history and family history with the patient and they are unchanged from previous note.  ALLERGIES:  is allergic to biaxin [clarithromycin], erythromycin, soy allergy, zithromax [azithromycin], and zofran [ondansetron hcl].  MEDICATIONS:  Current Outpatient Medications  Medication Sig Dispense Refill  . augmented betamethasone dipropionate (DIPROLENE-AF) 0.05 % cream APPLY TO AFFECTED AREA(S) UP TO TWICE DAILY AS NEEDED. NOT TO FACE GROIN OR UNDERARMS    . cholecalciferol (VITAMIN D) 1000 units tablet Take 1,000 Units by mouth daily. Reported on 07/01/2015    . COLLAGEN PO Take by mouth in the morning and at bedtime.    Marland Kitchen estradiol (ESTRACE) 1 MG tablet Take 1 mg by mouth daily.    . Linaclotide (LINZESS) 290 MCG CAPS capsule Take 1 capsule (290 mcg total) by mouth every morning. (Patient taking differently: Take 290 mcg by mouth as needed. ) 30 capsule 5  . Magnesium Hydroxide (DULCOLAX PO) Take by mouth.    . metoCLOPramide (REGLAN) 5 MG tablet TAKE 1 TABLET BY MOUTH 2 (TWO) TIMES DAILY AT 10 AM AND 4 PM. (Patient taking differently: Take 5 mg by mouth 2 (two) times daily. ) 180 tablet 1  . pantoprazole (PROTONIX) 40 MG tablet Take 1 tablet (40 mg total) by mouth daily. 30 tablet 11  . progesterone (PROMETRIUM) 100 MG capsule Take 100 mg by mouth at bedtime.     . temazepam (RESTORIL) 30 MG capsule Take 2 capsules (60 mg total) by mouth at bedtime. 60 capsule 1  . traZODone (DESYREL) 50 MG tablet Take 50 mg by mouth at bedtime.      No current facility-administered medications for this visit.    PHYSICAL EXAMINATION: ECOG PERFORMANCE STATUS: 1 - Symptomatic but completely ambulatory  Vitals:   12/05/19 1252  BP: 128/64  Pulse: 74  Resp: 18  Temp: 97.7 F (36.5 C)  SpO2: 99%   Filed Weights   12/05/19 1252  Weight: 136 lb 9.6 oz (62 kg)    GENERAL:alert, no distress and comfortable NEURO: alert & oriented x 3 with fluent speech, no focal motor/sensory deficits  LABORATORY DATA:  I have reviewed the data as listed    Component Value Date/Time   NA 130 (L) 11/27/2019 1304   NA 128 (L) 12/03/2016 1445   K 4.4 11/27/2019 1304   K 4.2 12/03/2016 1445   CL 95 (L) 11/27/2019 1304   CO2 27 11/27/2019 1304   CO2 27 12/03/2016 1445   GLUCOSE 98 11/27/2019 1304   GLUCOSE 96 12/03/2016 1445   BUN 13 11/27/2019 1304   BUN 11.4 12/03/2016 1445   CREATININE 1.10 (H) 11/27/2019 1304   CREATININE 1.27 (H) 10/02/2019 1257   CREATININE 1.1 12/03/2016 1445   CALCIUM 9.3 11/27/2019 1304   CALCIUM 9.4 12/03/2016 1445   PROT 5.9 (L) 11/27/2019 1304   PROT 6.3 (L)  12/03/2016 1445   ALBUMIN 4.0 11/27/2019 1304   ALBUMIN 4.3 12/03/2016 1445   AST 18 11/27/2019 1304   AST 18 10/02/2019 1257   AST 17 12/03/2016 1445   ALT 14 11/27/2019 1304   ALT 14 10/02/2019 1257   ALT 12 12/03/2016 1445   ALKPHOS 123 11/27/2019 1304   ALKPHOS 126 12/03/2016 1445   BILITOT 0.6 11/27/2019 1304   BILITOT 0.5 10/02/2019 1257   BILITOT 0.50 12/03/2016 1445   GFRNONAA 54 (L) 11/27/2019 1304   GFRNONAA 42 (L) 10/02/2019 1257   GFRAA 49 (L) 10/02/2019 1257    No results found for: SPEP, UPEP  Lab Results  Component Value Date   WBC 5.6 11/27/2019   NEUTROABS 3.4 11/27/2019   HGB 12.5 11/27/2019   HCT 36.4 11/27/2019   MCV 90.1 11/27/2019   PLT 134 (L) 11/27/2019      Chemistry      Component Value Date/Time   NA 130 (L) 11/27/2019 1304   NA 128 (L) 12/03/2016 1445   K 4.4 11/27/2019 1304   K 4.2 12/03/2016  1445   CL 95 (L) 11/27/2019 1304   CO2 27 11/27/2019 1304   CO2 27 12/03/2016 1445   BUN 13 11/27/2019 1304   BUN 11.4 12/03/2016 1445   CREATININE 1.10 (H) 11/27/2019 1304   CREATININE 1.27 (H) 10/02/2019 1257   CREATININE 1.1 12/03/2016 1445      Component Value Date/Time   CALCIUM 9.3 11/27/2019 1304   CALCIUM 9.4 12/03/2016 1445   ALKPHOS 123 11/27/2019 1304   ALKPHOS 126 12/03/2016 1445   AST 18 11/27/2019 1304   AST 18 10/02/2019 1257   AST 17 12/03/2016 1445   ALT 14 11/27/2019 1304   ALT 14 10/02/2019 1257   ALT 12 12/03/2016 1445   BILITOT 0.6 11/27/2019 1304   BILITOT 0.5 10/02/2019 1257   BILITOT 0.50 12/03/2016 1445       RADIOGRAPHIC STUDIES: I reviewed multiple CT imaging with the patient and her husband I have personally reviewed the radiological images as listed and agreed with the findings in the report. CT CHEST ABDOMEN PELVIS W CONTRAST  Result Date: 12/04/2019 CLINICAL DATA:  Lymphoma.  Abdominal pain and swelling. EXAM: CT CHEST, ABDOMEN, AND PELVIS WITH CONTRAST TECHNIQUE: Multidetector CT imaging of the chest, abdomen and pelvis was performed following the standard protocol during bolus administration of intravenous contrast. CONTRAST:  176m OMNIPAQUE IOHEXOL 300 MG/ML  SOLN COMPARISON:  CT chest 02/16/2018 and CT abdomen pelvis 11/17/2018. FINDINGS: CT CHEST FINDINGS Cardiovascular: Heart is at the upper limits of normal in size. Small pericardial effusion appears similar. Mediastinum/Nodes: No pathologically enlarged mediastinal, hilar or axillary lymph nodes. Esophagus is unremarkable. Lungs/Pleura: Mild biapical pleuroparenchymal scarring. Mild scarring in the right middle lobe. No pleural fluid. Airway is unremarkable. Musculoskeletal: No worrisome lytic or sclerotic lesions. CT ABDOMEN PELVIS FINDINGS Hepatobiliary: Low-attenuation lesions in the liver measure up to 11 mm and are likely cysts. Liver is enlarged, measuring 19.7 cm. Liver and gallbladder  are otherwise unremarkable. No biliary ductal dilatation. Pancreas: Negative. Spleen: Negative. Adrenals/Urinary Tract: Adrenal glands are unremarkable. Subcentimeter low-attenuation lesions in the kidneys are too small to characterize but statistically, cysts are likely. Ureters are decompressed. Bladder is grossly unremarkable. Stomach/Bowel: Small hiatal hernia. Stomach is decompressed. Small bowel and colon are unremarkable. Appendix is not well-visualized. Vascular/Lymphatic: Vascular structures are unremarkable. No pathologically enlarged lymph nodes. Reproductive: Uterus is visualized.  No adnexal mass. Other: No free fluid.  Mesenteries and peritoneum  are unremarkable. Musculoskeletal: Degenerative changes in the spine. No worrisome lytic or sclerotic lesions. IMPRESSION: 1. No evidence of recurrent lymphoma. No findings to explain the patient's given clinical history. 2. Similar small pericardial effusion. 3. Hepatomegaly. Electronically Signed   By: Lorin Picket M.D.   On: 12/04/2019 14:45

## 2019-12-05 NOTE — Assessment & Plan Note (Signed)
I suspect her abdominal bloating is due to disordered GI motility She was prescribed Linzess by her gastroenterologist but she is not taking it consistently for fear of having diarrhea when she visits her mother Her recent EGD and colonoscopy were within normal limits I encouraged her to continue metoclopramide before meals and to take Linzess on a regular basis if possible At this point in time, I do not believe she needs further work-up

## 2020-09-10 ENCOUNTER — Telehealth: Payer: Self-pay

## 2020-09-10 NOTE — Telephone Encounter (Signed)
Called regarding 9/13 appt to move to a different time. Kalayna's husband is having surgery and she requested to cancel appts on 9/13, appts canceled. She will call back to reschedule.

## 2020-10-01 ENCOUNTER — Other Ambulatory Visit: Payer: Medicare Other

## 2020-10-01 ENCOUNTER — Ambulatory Visit: Payer: Medicare Other | Admitting: Hematology and Oncology

## 2021-03-26 ENCOUNTER — Other Ambulatory Visit (INDEPENDENT_AMBULATORY_CARE_PROVIDER_SITE_OTHER): Payer: PPO

## 2021-03-26 ENCOUNTER — Encounter: Payer: Self-pay | Admitting: Gastroenterology

## 2021-03-26 ENCOUNTER — Ambulatory Visit: Payer: PPO | Admitting: Nurse Practitioner

## 2021-03-26 VITALS — BP 108/64 | HR 64 | Ht 67.0 in | Wt 134.2 lb

## 2021-03-26 DIAGNOSIS — R1030 Lower abdominal pain, unspecified: Secondary | ICD-10-CM

## 2021-03-26 DIAGNOSIS — R1013 Epigastric pain: Secondary | ICD-10-CM | POA: Diagnosis not present

## 2021-03-26 LAB — CBC WITH DIFFERENTIAL/PLATELET
Basophils Absolute: 0 10*3/uL (ref 0.0–0.1)
Basophils Relative: 0.7 % (ref 0.0–3.0)
Eosinophils Absolute: 0.1 10*3/uL (ref 0.0–0.7)
Eosinophils Relative: 1 % (ref 0.0–5.0)
HCT: 40.6 % (ref 36.0–46.0)
Hemoglobin: 13.7 g/dL (ref 12.0–15.0)
Lymphocytes Relative: 32.4 % (ref 12.0–46.0)
Lymphs Abs: 1.8 10*3/uL (ref 0.7–4.0)
MCHC: 33.7 g/dL (ref 30.0–36.0)
MCV: 91.9 fl (ref 78.0–100.0)
Monocytes Absolute: 0.4 10*3/uL (ref 0.1–1.0)
Monocytes Relative: 6.3 % (ref 3.0–12.0)
Neutro Abs: 3.4 10*3/uL (ref 1.4–7.7)
Neutrophils Relative %: 59.6 % (ref 43.0–77.0)
Platelets: 132 10*3/uL — ABNORMAL LOW (ref 150.0–400.0)
RBC: 4.42 Mil/uL (ref 3.87–5.11)
RDW: 12.7 % (ref 11.5–15.5)
WBC: 5.7 10*3/uL (ref 4.0–10.5)

## 2021-03-26 LAB — LIPASE: Lipase: 38 U/L (ref 11.0–59.0)

## 2021-03-26 MED ORDER — DICYCLOMINE HCL 20 MG PO TABS: 20.0000 mg | ORAL_TABLET | Freq: Two times a day (BID) | ORAL | 0 refills | Status: DC | PRN

## 2021-03-26 MED ORDER — FAMOTIDINE 20 MG PO TABS: 20.0000 mg | ORAL_TABLET | Freq: Every day | ORAL | 1 refills | Status: DC

## 2021-03-26 NOTE — Progress Notes (Signed)
03/26/2021 Shannon Barajas [id number] Aug 08, 1950   Chief Complaint: Abdominal pain  History of Present Illness: Shannon Barajas is a 71 year old female with a past medical history of arthritis, splenic B-cell lymphoma with liver involvement initially diagnosed in 2015 treated with Rituximab in remission since 2017, interstitial cystitis, CKD, IBS and GERD.  She is followed by Dr. Ardis Hughs.  She presents to our office today for further evaluation regarding epigastric pain and swelling with intermittent central lower abdominal pain for the past 2 weeks.  She describes feeling bloated.  She stated her central epigastric pain is different than the abdominal pain she experienced when she was diagnosed with lymphoma.  Approximately 2 weeks ago, she had severe central abdominal pain which radiated through to her central lower abdomen which was severe.  Eating does not worsen or improve her abdominal pain.  No nausea or vomiting.  She has a history of GERD and dysphagia with a benign esophageal stenosis/Schatzki's ring. She has occasional dysphagia, she describes food such as chicken or bread which gets stuck to the upper esophagus and passes if she waits a few minutes or if she jumps up and down which occurs once every 2 to 3 weeks.  She has chronic constipation for which she takes Linzess every 2 to 3 days for many years.  Linzess or results in passing a loose Mund like stools.  She occasionally sees bright red blood on the toilet tissue or in the toilet water after she passes multiple diarrhea bowel movements.  She is taking Pepto-Bismol and simethicone for her abdominal pain as needed.  She takes Tylenol for pain.  No NSAIDs.  She maintains a gluten and dairy free diet.  No fever, sweats or chills.  No noticeable weight loss, her weight fluctuates up and down 5 pounds at times.  She has undergone multiple EGDs in the past with esophageal dilatation.  Her most recent EGD was 06/02/2019 which identified a  benign-appearing intrinsic mild stenosis which was dilated to 20 mm otherwise was normal.  Her most recent colonoscopy was 06/02/2019 which showed internal and external hemorrhoids, no polyps.  Her last surveillance chest/abdominal/pelvic CT 12/04/2019 showed new small pericardial effusion, hepatomegaly without evidence of recurrent lymphoma.  She has not been seen by Dr. Alvy Bimler since 11/27/2019.  She reported undergoing routine laboratory studies within the past year by Dr. Ardeth Perfect which were normal without thrombocytopenia.  CBC Latest Ref Rng & Units 11/27/2019 10/02/2019 12/08/2018  WBC 4.0 - 10.5 K/uL 5.6 5.7 4.7  Hemoglobin 12.0 - 15.0 g/dL 12.5 13.1 12.5  Hematocrit 36.0 - 46.0 % 36.4 38.9 38.1  Platelets 150 - 400 K/uL 134(L) 154 127(L)    CMP Latest Ref Rng & Units 11/27/2019 10/02/2019 11/15/2018  Glucose 70 - 99 mg/dL 98 89 75  BUN 8 - 23 mg/dL '13 15 11  '$ Creatinine 0.44 - 1.00 mg/dL 1.10(H) 1.27(H) 0.91  Sodium 135 - 145 mmol/L 130(L) 138 131(L)  Potassium 3.5 - 5.1 mmol/L 4.4 3.7 3.9  Chloride 98 - 111 mmol/L 95(L) 102 96(L)  CO2 22 - 32 mmol/L '27 28 24  '$ Calcium 8.9 - 10.3 mg/dL 9.3 9.4 9.3  Total Protein 6.5 - 8.1 g/dL 5.9(L) 6.3(L) 6.0(L)  Total Bilirubin 0.3 - 1.2 mg/dL 0.6 0.5 0.4  Alkaline Phos 38 - 126 U/L 123 115 109  AST 15 - 41 U/L 18 18 13(L)  ALT 0 - 44 U/L '14 14 9    '$ Chest/abdomen/pelvic CT with contrast  12/04/2019: 1. No evidence of recurrent lymphoma. No findings to explain the patient's given clinical history. 2. Similar small pericardial effusion. 3. Hepatomegaly  EGD 06/02/2019: - One benign-appearing, intrinsic mild stenosis was found at the gastroesophageal junction (Schatzki's ring) dilated to 34m. - The examination was otherwise normal.  Colonoscopy 06/02/2019: - External and internal hemorrhoids. - The examination was otherwise normal on direct and retroflexion views. - No polyps ro cancers.  Current Outpatient Medications on File Prior to Visit   Medication Sig Dispense Refill   cholecalciferol (VITAMIN D) 1000 units tablet Take 1,000 Units by mouth daily. Reported on 07/01/2015     COLLAGEN PO Take by mouth in the morning and at bedtime.     estradiol (ESTRACE) 1 MG tablet Take 1 mg by mouth daily.     Linaclotide (LINZESS) 290 MCG CAPS capsule Take 1 capsule (290 mcg total) by mouth every morning. (Patient taking differently: Take 290 mcg by mouth as needed.) 30 capsule 5   Magnesium Hydroxide (DULCOLAX PO) Take by mouth.     metoCLOPramide (REGLAN) 5 MG tablet TAKE 1 TABLET BY MOUTH 2 (TWO) TIMES DAILY AT 10 AM AND 4 PM. (Patient taking differently: Take 5 mg by mouth 2 (two) times daily.) 180 tablet 1   pantoprazole (PROTONIX) 40 MG tablet Take 1 tablet (40 mg total) by mouth daily. 30 tablet 11   progesterone (PROMETRIUM) 100 MG capsule Take 100 mg by mouth at bedtime.      temazepam (RESTORIL) 30 MG capsule Take 2 capsules (60 mg total) by mouth at bedtime. 60 capsule 1   thiamine (VITAMIN B-1) 50 MG tablet Take 50 mg by mouth daily.     traZODone (DESYREL) 50 MG tablet Take 50 mg by mouth at bedtime.     vitamin C (ASCORBIC ACID) 500 MG tablet Take 500 mg by mouth daily.     No current facility-administered medications on file prior to visit.   Allergies  Allergen Reactions   Biaxin [Clarithromycin] Other (See Comments)    REACTION: aggrevates IBS   Erythromycin     REACTION: aggrevates IBS   Soy Allergy Other (See Comments)    May arregevate interstitial cystitis   Zithromax [Azithromycin]     Upset stomach   Zofran [Ondansetron Hcl]     Daughter had anaphlaxis from, patient does not take   Current Medications, Allergies, Past Medical History, Past Surgical History, Family History and Social History were reviewed in [employer].  Review of Systems:   Constitutional: Negative for fever, sweats, chills or weight loss.  Respiratory: Negative for shortness of breath.   Cardiovascular:  Negative for chest pain, palpitations and leg swelling.  Gastrointestinal: See HPI.  Musculoskeletal: Negative for back pain or muscle aches.  Neurological: Negative for dizziness, headaches or paresthesias.   Physical Exam: BP 108/64    Pulse 64    Ht '5\' 7"'$  (1.702 m)    Wt 134 lb 3.2 oz (60.9 kg)    BMI 21.02 kg/m   Wt Readings from Last 3 Encounters:  03/26/21 134 lb 3.2 oz (60.9 kg)  12/05/19 136 lb 9.6 oz (62 kg)  11/27/19 135 lb (61.2 kg)    General: 71year old female in no acute distress. Head: Normocephalic and atraumatic. Eyes: No scleral icterus. Conjunctiva pink . Ears: Normal auditory acuity. Mouth: Dentition intact. No ulcers or lesions.  Lungs: Clear throughout to auscultation. Heart: Regular rate and rhythm, no murmur. Abdomen: Soft, nondistended.  Epigastric and mild lower abdominal  tenderness without rebound or guarding.  No masses.  No obvious hepatomegaly or splenomegaly.  Normal bowel sounds x 4 quadrants.  Rectal: Deferred. Musculoskeletal: Symmetrical with no gross deformities. Extremities: No edema. Neurological: Alert oriented x 4. No focal deficits.  Psychological: Alert and cooperative. Normal mood and affect  Assessment and Recommendations:  51) 71 year old female with a history of splenic B-cell lymphoma initially diagnosed in 2015 treated with chemotherapy.  Her most recent surveillance CT 12/04/2019 showed hepatomegaly without evidence of recurrent lymphoma.  She presents today with complaints of epigastric pain and lower abdominal pain for the past 2 to 3 weeks.  Epigastric pain is more prominent than her lower abdominal pain and is unlike any abdominal pain she experienced in the past. -CBC, CMP and lipase level -CTAP with oral and IV contrast.  Note, patient has a history of CKD, await BUN/creatinine/GFR results to determine if she is an appropriate candidate for IV contrast -Omeprazole 20 mg 1 p.o. daily -Dicyclomine 10 mg 1 p.o. twice daily as  needed abdominal pain, stop if dizziness or lightheadedness occurs  2) History of GERD and dysphagia secondary to Schatzki's ring.  Patient continues to have dysphagia once every 2 to 3 weeks. -Eventual repeat EGD with esophageal dilatation -Await the above lab and CT results prior to scheduling EGD -Continue pantoprazole 40 mg daily.  Start famotidine 20 mg nightly as needed. -Avoid foods such as large pieces of meat, dry bread and rice.  3) History of constipation predominant IBS -Continue Linzess as needed  4) Infrequent rectal bleeding with history of internal and external hemorrhoids.  Last colonoscopy was 06/02/2019 which confirmed internal and external hemorrhoids without colon polyps. -No further colon cancer screening colonoscopies were recommended due to age -Patient to contact office if rectal bleeding worsens  5) CKD

## 2021-03-26 NOTE — Patient Instructions (Addendum)
1)  Continue Pantoprazole 87m once daily to be taken 30 minutes before breakfast ? ?2) Famotidine 28mone tab at bed time  ? ?3) Take Dicyclomine 10 mg one tab twice daily as needed for abdominal pain. Stop it if you develop dizziness or lightheadedness. ? ?4) Go to the emergency room if you develop severe abdominal pain ? ?5) Push fluids, bland diet as tolerated  ? ?Please proceed to the basement level for lab work before leaving today. Press "B" on the elevator. The lab is located at the first door on the left as you exit the elevator. ? ?HEALTHCARE LAWS AND MY CHART RESULTS:  ? ?Due to recent changes in healthcare laws, you may see results of your imaging and/or laboratory studies on MyChart before I have had a chance to review them.  I understand that in some cases there may be results that are confusing or concerning to you. Please understand that not all results are received at the same time and often I may need to interpret multiple results in order to provide you with the best plan of care or course of treatment. Therefore, I ask that you please give me 48 hours to thoroughly review all your results before contacting my office for clarification.  ? ?You have been scheduled [facility] CT [facility]  ? ?You are scheduled on 03/31/21 at 4:30pm. Please arrive at 4:00pm for registration. Please follow the written instructions below on the day of your exam: ? ?WARNING: IF YOU ARE ALLERGIC TO IODINE/X-RAY DYE, PLEASE NOTIFY RADIOLOGY IMMEDIATELY AT [phone] WILL BE GIVEN A 13 HOUR PREMEDICATION PREP. ? ?1) Do not eat or drink anything 4 hours prior to your test ?2) You have been given 2 bottles of oral contrast to drink. The solution may taste better if refrigerated, but do NOT add ice or any other liquid to this solution. Shake well before drinking. ?  ? Drink 1 bottle of contrast @ 2:30pm (2 hours prior to your exam) ? Drink 1 bottle of contrast @ 3:30pm  (1 hour prior to your exam) ? ?You may take any medications as prescribed with a small amount of water, if necessary. If you take any of the following medications: METFORMIN, GLUCOPHAGE, GLUCOVANCE, AVANDAMET, RIOMET, FORTAMET, ACTOPLUS MET, JANUMET, GL[place]attsburghr METAGLIP, you MAY be asked to HOLD this medication 48 hours AFTER the exam. ? ?The purpose of you drinking the oral contrast is to aid in the visualization of your intestinal tract. The contrast solution may cause some diarrhea. Depending on your individual set of symptoms, you may also receive an intravenous injection of x-ray contrast/dye. [facility], depending on the type of exam you are having performed. ? ?

## 2021-03-27 LAB — COMPLETE METABOLIC PANEL WITH GFR
AG Ratio: 2.6 (calc) — ABNORMAL HIGH (ref 1.0–2.5)
ALT: 15 U/L (ref 6–29)
AST: 20 U/L (ref 10–35)
Albumin: 4.5 g/dL (ref 3.6–5.1)
Alkaline phosphatase (APISO): 129 U/L (ref 37–153)
BUN/Creatinine Ratio: 17 (calc) (ref 6–22)
BUN: 20 mg/dL (ref 7–25)
CO2: 25 mmol/L (ref 20–32)
Calcium: 9.7 mg/dL (ref 8.6–10.4)
Chloride: 99 mmol/L (ref 98–110)
Creat: 1.15 mg/dL — ABNORMAL HIGH (ref 0.60–1.00)
Globulin: 1.7 g/dL (calc) — ABNORMAL LOW (ref 1.9–3.7)
Glucose, Bld: 87 mg/dL (ref 65–99)
Potassium: 4.4 mmol/L (ref 3.5–5.3)
Sodium: 136 mmol/L (ref 135–146)
Total Bilirubin: 0.5 mg/dL (ref 0.2–1.2)
Total Protein: 6.2 g/dL (ref 6.1–8.1)
eGFR: 51 mL/min/{1.73_m2} — ABNORMAL LOW (ref >=60)

## 2021-03-27 NOTE — Progress Notes (Signed)
I agree with the above note, plan 

## 2021-03-31 ENCOUNTER — Other Ambulatory Visit: Payer: Self-pay

## 2021-03-31 ENCOUNTER — Ambulatory Visit (HOSPITAL_COMMUNITY)
Admission: RE | Admit: 2021-03-31 | Discharge: 2021-03-31 | Disposition: A | Discharge status: Home / Self Care | Payer: PPO | Source: Ambulatory Visit | Attending: Nurse Practitioner | Admitting: Nurse Practitioner

## 2021-03-31 DIAGNOSIS — K5900 Constipation, unspecified: Secondary | ICD-10-CM | POA: Diagnosis not present

## 2021-03-31 DIAGNOSIS — R1030 Lower abdominal pain, unspecified: Secondary | ICD-10-CM | POA: Diagnosis not present

## 2021-03-31 DIAGNOSIS — N281 Cyst of kidney, acquired: Secondary | ICD-10-CM | POA: Diagnosis not present

## 2021-03-31 DIAGNOSIS — R1013 Epigastric pain: Secondary | ICD-10-CM | POA: Diagnosis not present

## 2021-03-31 MED ORDER — IOHEXOL 300 MG/ML  SOLN
100.0000 mL | Freq: Once | INTRAMUSCULAR | Status: AC | PRN
  Administered 2021-03-31: 75 mL via INTRAVENOUS

## 2021-03-31 MED ORDER — SODIUM CHLORIDE (PF) 0.9 % IJ SOLN
INTRAMUSCULAR | Status: AC
  Filled 2021-03-31: qty 50

## 2021-04-07 ENCOUNTER — Other Ambulatory Visit: Payer: Self-pay

## 2021-04-07 DIAGNOSIS — R1310 Dysphagia, unspecified: Secondary | ICD-10-CM

## 2021-04-08 DIAGNOSIS — N390 Urinary tract infection, site not specified: Secondary | ICD-10-CM | POA: Diagnosis not present

## 2021-05-14 DIAGNOSIS — R350 Frequency of micturition: Secondary | ICD-10-CM | POA: Diagnosis not present

## 2021-05-14 DIAGNOSIS — R3121 Asymptomatic microscopic hematuria: Secondary | ICD-10-CM | POA: Diagnosis not present

## 2021-05-14 DIAGNOSIS — R351 Nocturia: Secondary | ICD-10-CM | POA: Diagnosis not present

## 2021-05-25 ENCOUNTER — Encounter: Payer: Self-pay | Admitting: Certified Registered Nurse Anesthetist

## 2021-05-27 ENCOUNTER — Telehealth: Payer: Self-pay | Admitting: Gastroenterology

## 2021-05-27 NOTE — Telephone Encounter (Signed)
Patient called stating she has an EGD and did not receive any instructions.  She wanted to know when she should stop drinking prior to her procedure and what she should do the day prior. ? ?She also requested refills for generic Bentyl, generic Pepcid, and generic Protonix. ?

## 2021-05-27 NOTE — Telephone Encounter (Signed)
Pt last saw Colleen.  I will send to Ammie ?

## 2021-05-30 ENCOUNTER — Ambulatory Visit (AMBULATORY_SURGERY_CENTER): Payer: PPO | Admitting: Gastroenterology

## 2021-05-30 ENCOUNTER — Encounter: Payer: Self-pay | Admitting: Gastroenterology

## 2021-05-30 VITALS — BP 118/70 | HR 79 | Temp 96.6°F | Resp 18 | Ht 67.0 in | Wt 134.0 lb

## 2021-05-30 DIAGNOSIS — K219 Gastro-esophageal reflux disease without esophagitis: Secondary | ICD-10-CM | POA: Diagnosis not present

## 2021-05-30 DIAGNOSIS — K222 Esophageal obstruction: Secondary | ICD-10-CM | POA: Diagnosis not present

## 2021-05-30 DIAGNOSIS — K2970 Gastritis, unspecified, without bleeding: Secondary | ICD-10-CM

## 2021-05-30 DIAGNOSIS — R1013 Epigastric pain: Secondary | ICD-10-CM | POA: Diagnosis not present

## 2021-05-30 DIAGNOSIS — R1310 Dysphagia, unspecified: Secondary | ICD-10-CM | POA: Diagnosis not present

## 2021-05-30 DIAGNOSIS — K2950 Unspecified chronic gastritis without bleeding: Secondary | ICD-10-CM | POA: Diagnosis not present

## 2021-05-30 DIAGNOSIS — N183 Chronic kidney disease, stage 3 unspecified: Secondary | ICD-10-CM | POA: Diagnosis not present

## 2021-05-30 MED ORDER — FAMOTIDINE 20 MG PO TABS: 20.0000 mg | ORAL_TABLET | Freq: Every day | ORAL | 11 refills | Status: DC

## 2021-05-30 MED ORDER — DICYCLOMINE HCL 20 MG PO TABS: 20.0000 mg | ORAL_TABLET | Freq: Two times a day (BID) | ORAL | 11 refills | Status: DC | PRN

## 2021-05-30 MED ORDER — PANTOPRAZOLE SODIUM 40 MG PO TBEC: 40.0000 mg | DELAYED_RELEASE_TABLET | Freq: Every day | ORAL | 11 refills | Status: DC

## 2021-05-30 MED ORDER — SODIUM CHLORIDE 0.9 % IV SOLN: 500.0000 mL | Freq: Once | INTRAVENOUS | Status: DC

## 2021-05-30 NOTE — Progress Notes (Signed)
Called to room to assist during endoscopic procedure.  Patient ID and intended procedure confirmed with present staff. Received instructions for my participation in the procedure from the performing physician.  

## 2021-05-30 NOTE — Progress Notes (Signed)
1110 Robinul 0.1 mg IV given due large amount of secretions upon assessment.  MD made aware, vss 

## 2021-05-30 NOTE — Patient Instructions (Signed)
Handout given for gastritis. ? ?YOU HAD AN ENDOSCOPIC PROCEDURE TODAY AT [facility]:   Refer to the procedure report that was given to you for any specific questions about what was found during the examination.  If the procedure report does not answer your questions, please call your gastroenterologist to clarify.  If you requested that your care partner not be given the details of your procedure findings, then the procedure report has been included in a sealed envelope for you to review at your convenience later. ? ?YOU SHOULD EXPECT: Some feelings of bloating in the abdomen. Passage of more gas than usual.  Walking can help get rid of the air that was put into your GI tract during the procedure and reduce the bloating. If you had a lower endoscopy (such as a colonoscopy or flexible sigmoidoscopy) you may notice spotting of blood in your stool or on the toilet paper. If you underwent a bowel prep for your procedure, you may not have a normal bowel movement for a few days. ? ?Please Note:  You might notice some irritation and congestion in your nose or some drainage.  This is from the oxygen used during your procedure.  There is no need for concern and it should clear up in a day or so. ? ?SYMPTOMS TO REPORT IMMEDIATELY: ? ?Following upper endoscopy (EGD) ? Vomiting of blood or coffee ground material ? New chest pain or pain under the shoulder blades ? Painful or persistently difficult swallowing ? New shortness of breath ? Fever of 100?F or higher ? Black, tarry-looking stools ? ?For urgent or emergent issues, a gastroenterologist can be reached at any hour by calling [phone]. ?Do not use MyChart messaging for urgent concerns.  ? ? ?DIET:  We do recommend a small meal at first, but then you may proceed to your regular diet.  Drink plenty of fluids but you should avoid alcoholic beverages for 24 hours. ? ?ACTIVITY:  You should plan to take it easy for the rest of today and you should NOT  DRIVE or use heavy machinery until tomorrow (because of the sedation medicines used during the test).   ? ?FOLLOW UP: ?Our staff will call the number listed on your records 48-72 hours following your procedure to check on you and address any questions or concerns that you may have regarding the information given to you following your procedure. If we do not reach you, we will leave a message.  We will attempt to reach you two times.  During this call, we will ask if you have developed any symptoms of COVID 19. If you develop any symptoms (ie: fever, flu-like symptoms, shortness of breath, cough etc.) before then, please call [phone].  If you test positive for Covid 19 in the 2 weeks post procedure, please call and report this information to [place].   ? ?If any biopsies were taken you will be contacted by phone or by letter within the next 1-3 weeks.  Please call us at [phone] if you have not heard about the biopsies in 3 weeks.  ? ? ?SIGNATURES/CONFIDENTIALITY: ?You and/or your care partner have signed paperwork which will be entered into your electronic medical record.  These signatures attest to the fact that that the information above on your After Visit Summary has been reviewed and is understood.  Full responsibility of the confidentiality of this discharge information lies with you and/or your care-partner.  ?

## 2021-05-30 NOTE — Progress Notes (Signed)
Schatzki's ring; dilated multiple times by Dr. Henrene Pastor previously. Most recent EGD was 2016 with balloon dilation to 20m.  Switched care to Dr. JArdis Hughsin 2016.  EGD July 2019 Dr. JArdis Hughsshowed thick Schatzki's ring which was dilated to 19 mm with good effect. 05/2019 EGD same ring noted, dilated to 253m ? ?HPI: ?This is a woman with epig pain, dysphagia ? ? ?ROS: complete GI ROS as described in HPI, all other review negative. ? ?Constitutional:  No unintentional weight loss ? ? ?Past Medical History:  ?Diagnosis Date  ? Allergy   ? SEASONAL  ? Anemia   ? Arthritis   ? Cataract   ? BILATERAL  ? Chronic interstitial cystitis   ? Chronic kidney disease (CKD), stage III (moderate) ([facility]  ? nephrology dr goClover Mealy year  ? GERD (gastroesophageal reflux disease)   ? H/O: iron deficiency anemia   ? Hemorrhoids 07/26/2013  ? Hiatal hernia   ? small   ? Insomnia, unspecified   ? Interstitial cystitis   ? Irritable bowel syndrome   ? 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 Non Hodgkin's lymphoma ([facility]2013  ? Osteoporosis   ? PONV (postoperative nausea and vomiting)   ? likes phenergan  ? Raynaud's syndrome   ? Splenomegaly   ? Stricture and stenosis of esophagus   ? Thrombocytopenia, unspecified ([facility]  ? Unspecified constipation   ? ? ?Past Surgical History:  ?Procedure Laterality Date  ? APPENDECTOMY  2012  ? APPENDECTOMY  2012  ? BALLOON DILATION N/A 09/06/2014  ? Procedure: BALLOON DILATION;  Surgeon: DaMilus BanisterMD;  [facility];  Service: Endoscopy;  Laterality: N/A;  ? BREAST LUMPECTOMY Left   ? DILATION AND CURETTAGE OF UTERUS  yrs ago  ? ESOPHAGEAL DILATION    ? ESOPHAGOGASTRODUODENOSCOPY (EGD) WITH PROPOFOL N/A 09/06/2014  ? Procedure: ESOPHAGOGASTRODUODENOSCOPY (EGD) WITH PROPOFOL;  Surgeon: DaMilus BanisterMD;  [facility];  Service: Endoscopy;  Laterality: N/A;  ? HYSTEROSCOPY WITH D & C N/A 12/08/2018  ? Procedure: DILATATION AND CURETTAGE /HYSTEROSCOPY;   Surgeon: GrDian QueenMD;  [facility]: [facility];  Laterality: N/A;  ? TONSILLECTOMY    ? TUBAL LIGATION    ? VIDEO BRONCHOSCOPY Bilateral 08/23/2015  ? Procedure: VIDEO BRONCHOSCOPY WITH FLUORO;  Surgeon: JeJavier GlazierMD;  [facility];  Service: Cardiopulmonary;  Laterality: Bilateral;  ? ? ?Current Outpatient Medications  ?Medication Sig Dispense Refill  ? cholecalciferol (VITAMIN D) 1000 units tablet Take 1,000 Units by mouth daily. Reported on 07/01/2015    ? COLLAGEN PO Take by mouth in the morning and at bedtime.    ? dicyclomine (BENTYL) 20 MG tablet Take 1 tablet (20 mg total) by mouth 2 (two) times daily as needed for spasms (Abdominal pain). 30 tablet 0  ? estradiol (ESTRACE) 1 MG tablet Take 1 mg by mouth daily.    ? famotidine (PEPCID) 20 MG tablet Take 1 tablet (20 mg total) by mouth at bedtime. 30 tablet 1  ? Linaclotide (LINZESS) 290 MCG CAPS capsule Take 1 capsule (290 mcg total) by mouth every morning. (Patient taking differently: Take 290 mcg by mouth as needed.) 30 capsule 5  ? metoCLOPramide (REGLAN) 5 MG tablet TAKE 1 TABLET BY MOUTH 2 (TWO) TIMES DAILY AT 10 AM AND 4 PM. (Patient taking differently: Take 5 mg by mouth 2 (two) times daily.) 180 tablet 1  ? pantoprazole (PROTONIX) 40 MG tablet Take  1 tablet (40 mg total) by mouth daily. 30 tablet 11  ? progesterone (PROMETRIUM) 100 MG capsule Take 100 mg by mouth at bedtime.     ? temazepam (RESTORIL) 30 MG capsule Take 2 capsules (60 mg total) by mouth at bedtime. 60 capsule 1  ? thiamine (VITAMIN B-1) 50 MG tablet Take 50 mg by mouth daily.    ? traZODone (DESYREL) 50 MG tablet Take 50 mg by mouth at bedtime.    ? vitamin C (ASCORBIC ACID) 500 MG tablet Take 500 mg by mouth daily.    ? ?Current Facility-Administered Medications  ?Medication Dose Route Frequency Provider Last Rate Last Admin  ? 0.9 %  sodium chloride infusion  500 mL Intravenous Once [name], MD      ? ? ?Allergies  as of 05/30/2021 - Review Complete 05/30/2021  ?Allergen Reaction Noted  ? Biaxin [clarithromycin] Other (See Comments)   ? Erythromycin    ? Soy allergy Other (See Comments) 04/27/2012  ? Zithromax [azithromycin]  04/27/2012  ? Zofran [ondansetron hcl]  08/20/2014  ? ? ?Family History  ?Problem Relation Age of Onset  ? Lymphoma Father   ?     lymphoma  ? Cirrhosis Father   ?     heavy drinker  ? Diabetes Maternal Grandmother   ? Colon cancer Maternal Grandfather   ? Cancer Maternal Grandfather   ?     liver ca ?  ? Esophageal cancer Neg Hx   ? Rectal cancer Neg Hx   ? Stomach cancer Neg Hx   ? ? ?Social History  ? ?Socioeconomic History  ? Marital status: Married  ?  Spouse name: Not on file  ? Number of children: 2  ? Years of education: Not on file  ? Highest education level: Not on file  ?Occupational History  ? Occupation: retired  ?Tobacco Use  ? Smoking status: Never  ?  Passive exposure: Yes  ? Smokeless tobacco: Never  ? Tobacco comments:  ?  Father growing up & daughter currently  ?Vaping Use  ? Vaping Use: Never used  ?Substance and Sexual Activity  ? Alcohol use: No  ? Drug use: No  ? Sexual activity: Not on file  ?Other Topics Concern  ? Not on file  ?Social History Narrative  ? Originally from [place]. She moved to [place] in the 1980s. She is a Orthoptist. She has also traveled to [place], [place], & [place]. No pets currently. No bird exposure. 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 Enjoys spending time with family & reading.   ? ?Social Determinants of Health  ? ?Financial Resource Strain: Not on file  ?Food Insecurity: Not on file  ?Transportation Needs: Not on file  ?Physical Activity: Not on file  ?Stress: Not on file  ?Social Connections: Not on file  ?Intimate Partner Violence: Not on file  ? ? ? ?Physical Exam: ?BP 99/62   Pulse 75   Temp (!) 96.6 ?F (35.9 ?C)   Ht '5\' 7"'$  (1.702 m)   Wt 134 lb (60.8 kg)   SpO2 99%   BMI 20.99 kg/m?  ?Constitutional: generally  well-appearing ?Psychiatric: alert and oriented x3 ?Lungs: CTA bilaterally ?Heart: no MCR ? ?Assessment and plan: ?71 y.o. female with epig pain, dyusphagia ? ?Egd today ? ?Care is appropriate for the ambulatory setting. ? ?Owens Loffler, MD ?[facility] ?05/30/2021, 10:57 AM ? ? ? ?

## 2021-05-30 NOTE — Progress Notes (Signed)
Report given to PACU, vss 

## 2021-05-30 NOTE — Op Note (Signed)
[place] ?Patient Name: Shannon Barajas ?Procedure Date: 05/30/2021 10:50 AM ?MRN: [id number] ?Endoscopist: [name] , MD ?Age: 71 ?Referring MD:  ?Date of Birth: May 25, 1950 ?Gender: Female ?Account #: [id number] ?Procedure:                Upper GI endoscopy ?Indications:              Epigastric abdominal pain, Generalized abdominal  ?                          pain, Dysphagia Schatzki's ring;??dilated multiple  ?                          times by Dr. Henrene Pastor previously. Most recent EGD was  ?                          2016 with balloon dilation to 68m.????Switched??care  ?                          to Dr. JArdis Hughsin 2016. ??EGD July 2019 Dr. JArdis Hughs ?                          showed thick Schatzki's ring which was dilated to  ?                          19 mm with good effect. 05/2019 EGD same ring noted,  ?                          dilated to 285m ?Medicines:                Monitored Anesthesia Care ?Procedure:                Pre-Anesthesia Assessment: ?                          - Prior to the procedure, a History and Physical  ?                          was performed, and patient medications and  ?                          allergies were reviewed. The patient's tolerance of  ?                          previous anesthesia was also reviewed. The risks  ?                          and benefits of the procedure and the sedation  ?                          options and risks were discussed with the patient.  ?                          All questions were answered, and informed consent  ?  was obtained. Prior Anticoagulants: The patient has  ?                          taken no previous anticoagulant or antiplatelet  ?                          agents. ASA Grade Assessment: II - A patient with  ?                          mild systemic disease. After reviewing the risks  ?                          and benefits, the patient was deemed in  ?                          satisfactory condition  to undergo the procedure. ?                          After obtaining informed consent, the endoscope was  ?                          passed under direct vision. Throughout the  ?                          procedure, the patient's blood pressure, pulse, and  ?                          oxygen saturations were monitored continuously. The  ?                          GIF [id number] #[id number] was introduced through the  ?                          mouth, and advanced to the second part of duodenum.  ?                          The upper GI endoscopy was accomplished without  ?                          difficulty. The patient tolerated the procedure  ?                          well. ?Scope In: ?Scope Out: ?Findings:                 Small amount of retained solid food in her stomach. ?                          Mild inflammation characterized by erythema was  ?                          found in the gastric antrum. Biopsies were taken  ?  with a cold forceps for histology. ?                          Previously known Schatzki's ring was found at the  ?                          gastroesophageal junction. This was only mildly  ?                          stenotic. A TTS dilator was passed through the  ?                          scope. Dilation with an 18-19-20 mm balloon dilator  ?                          was performed to 20 mm. ?                          The exam was otherwise without abnormality. ?Complications:            No immediate complications. Estimated blood loss:  ?                          None. ?Estimated Blood Loss:     Estimated blood loss: none. ?Impression:               - Mild, non-specific distal gastritis. Biopsied to  ?                          check for H. pylori. ?                          - Previously known Schatzki's ring was found at the  ?                          gastroesophageal junction. This was only mildly  ?                          stenotic. A TTS dilator was passed through the  ?                           scope. Dilation with an 18-19-20 mm balloon dilator  ?                          was performed to 20 mm. ?                          - Small amount of retained solid food in her  ?                          stomach. ?                          - The examination was otherwise normal. ?Recommendation:           - Patient has a contact number  available for  ?                          emergencies. The signs and symptoms of potential  ?                          delayed complications were discussed with the  ?                          patient. Return to normal activities tomorrow.  ?                          Written discharge instructions were provided to the  ?                          patient. ?                          - Resume previous diet. ?                          - Continue present medications. ?                          - Await pathology results. Will consider testing of  ?                          gatroparesis if the biopsies do NOT show H. pylori. ?                          - Refills provided today: Bentyl '20mg'$  pills, one  ?                          pill BID PRN, disp 60 with 11 refills. Pepcid '20mg'$   ?                          pills, one pill at bedtime night, disp 30 pills  ?                          with 11 refills. Protonix '40mg'$  pills, one pill  ?                          before breakfast every morning, disp 30 with 11  ?                          refills. ?[name], MD ?05/30/2021 11:28:37 AM ?This report has been signed electronically. ?

## 2021-06-03 ENCOUNTER — Telehealth: Payer: Self-pay

## 2021-06-03 NOTE — Telephone Encounter (Signed)
?  Follow up Call- ? ? ?  05/30/2021  ? 10:45 AM 05/31/2019  ?  1:18 PM  ?Call back number  ?Post procedure Call Back phone  # [phone]  ?Permission to leave phone message Yes Yes  ?  ? ?Patient questions: ? ?Do you have a fever, pain , or abdominal swelling? No. ?Pain Score  0 * ? ?Have you tolerated food without any problems? Yes.   ? ?Have you been able to return to your normal activities? Yes.   ? ?Do you have any questions about your discharge instructions: ?Diet   No. ?Medications  No. ?Follow up visit  No. ? ?Do you have questions or concerns about your Care? No. ? ?Actions: ?* If pain score is 4 or above: ?No action needed, pain <4. ? ? ?

## 2021-06-06 ENCOUNTER — Other Ambulatory Visit: Payer: Self-pay

## 2021-06-06 DIAGNOSIS — R1013 Epigastric pain: Secondary | ICD-10-CM

## 2021-06-30 ENCOUNTER — Ambulatory Visit (HOSPITAL_COMMUNITY)
Admission: RE | Admit: 2021-06-30 | Discharge: 2021-06-30 | Disposition: A | Discharge status: Home / Self Care | Payer: PPO | Source: Ambulatory Visit | Attending: Gastroenterology | Admitting: Gastroenterology

## 2021-06-30 DIAGNOSIS — R1013 Epigastric pain: Secondary | ICD-10-CM | POA: Insufficient documentation

## 2021-06-30 MED ORDER — TECHNETIUM TC 99M SULFUR COLLOID
2.0600 | Freq: Once | INTRAVENOUS | Status: AC
  Administered 2021-06-30: 2.06 via ORAL

## 2021-07-09 DIAGNOSIS — R5383 Other fatigue: Secondary | ICD-10-CM | POA: Diagnosis not present

## 2021-07-09 DIAGNOSIS — Z1152 Encounter for screening for COVID-19: Secondary | ICD-10-CM | POA: Diagnosis not present

## 2021-07-09 DIAGNOSIS — R059 Cough, unspecified: Secondary | ICD-10-CM | POA: Diagnosis not present

## 2021-07-09 DIAGNOSIS — J029 Acute pharyngitis, unspecified: Secondary | ICD-10-CM | POA: Diagnosis not present

## 2021-07-09 DIAGNOSIS — R0981 Nasal congestion: Secondary | ICD-10-CM | POA: Diagnosis not present

## 2021-09-01 ENCOUNTER — Encounter: Payer: Self-pay | Admitting: Physician Assistant

## 2021-09-01 ENCOUNTER — Ambulatory Visit: Payer: PPO | Admitting: Physician Assistant

## 2021-09-01 VITALS — BP 114/66 | HR 76 | Ht 67.0 in | Wt 136.0 lb

## 2021-09-01 DIAGNOSIS — R194 Change in bowel habit: Secondary | ICD-10-CM | POA: Diagnosis not present

## 2021-09-01 DIAGNOSIS — R1084 Generalized abdominal pain: Secondary | ICD-10-CM

## 2021-09-01 DIAGNOSIS — K581 Irritable bowel syndrome with constipation: Secondary | ICD-10-CM | POA: Diagnosis not present

## 2021-09-01 DIAGNOSIS — Z8572 Personal history of non-Hodgkin lymphomas: Secondary | ICD-10-CM

## 2021-09-01 MED ORDER — NA SULFATE-K SULFATE-MG SULF 17.5-3.13-1.6 GM/177ML PO SOLN: 1.0000 | Freq: Once | ORAL | 0 refills | Status: AC

## 2021-09-01 NOTE — Progress Notes (Unsigned)
Chief Complaint: Abdominal pain and change in bowel habits  HPI:    Shannon Barajas is a 71 year old Caucasian female, known to Dr. Ardis Hughs, with a past medical history as listed below including splenic B-cell lymphoma with liver involvement initially diagnosed in 2015 treated with rituximab in remission since 2017, interstitial cystitis, CKD, IBS and GERD, who presents to clinic today for complaint of continued abdominal pain and change in bowel habits.      06/02/2019 colonoscopy with external and internal hemorrhoids and otherwise normal.    03/26/2021 patient seen in clinic by Carl Best at that time described intermittent central lower abdominal pain for 2 weeks as well as feeling bloated as well as a central epigastric pain.  Sometimes this was severe.  Described history of reflux and dysphagia and was having trouble eating foods such as chicken and bread.  Noted chronic constipation for which she took Linzess 290 mcg every 2 to 3 days for years.  At that time patient had labs including CBC, CMP and lipase as well as a CTAP with oral and IV contrast and was given Dicyclomine 10 mg 1 p.o. twice daily as needed for abdominal pain.  Also scheduled for CT AP and EGD.    03/31/2021 CT of the abdomen pelvis with contrast with no acute abnormality, no pathologically enlarged abdominal or pelvic lymph nodes to suggest lymphoma recurrence, moderate volume of formed stool throughout the colon suggestive of constipation and aortic atherosclerosis.    05/30/2021 EGD with mild nonspecific distal gastritis, Schatzki's ring dilated and a small amount of retained food in her stomach.  Pathology showed reactive gastropathy and patient was scheduled for a gastric emptying scan.    06/30/2021 gastric emptying study was completely normal.    Today, the patient returns to clinic accompanied by her husband.  She tells me that all of her symptoms have worsened.  Anytime she eats anything she gets very bloated and it  feels like she has a basketball in her abdomen with a lot of burping and gas.  She is also getting occasional sharp pains that shoot throughout her abdomen which is very similar to the symptoms she was experiencing prior to being diagnosed with non-Hodgkin's lymphoma.  Tells me that the sharp pains occur maybe once every couple of weeks but almost bring her to her knees.  She tells me she is on a very limited diet already given her interstitial cystitis and IBS and it does not matter what she eats everything seems to give her pain.      Her husband is with her and corroborates her history and also tells me that he is very worried given that the symptoms she expresses are the same prior to being diagnosed with lymphoma back in 2015 and previously did been to multiple physicians who told her it was all in her "head" and that she had IBS and nothing else.  He is afraid that something is going on.  Patient does tell me she has followed with her oncologist who is not concerned.  Describes worsening of her constipation recently and stools that look like "mud".  Also sharp pains across her lower abdomen.  Currently she is on Reglan 5 mg twice daily as well as Famotidine 20 mg at bedtime and Linzess 290 mcg every 3 days as well as Pantoprazole 40 mg daily and Dicyclomine 20 mg twice a day as needed.    Denies fever, chills, weight loss or blood in her stool.  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   Non Hodgkin's lymphoma ([place]) 04/19/2013    Osteoporosis    PONV (postoperative nausea and vomiting)    likes phenergan   Raynaud's syndrome    Splenomegaly    Stricture and stenosis of esophagus    Thrombocytopenia, unspecified ([place])    Unspecified constipation     Past Surgical History:  Procedure Laterality Date   APPENDECTOMY  2012   APPENDECTOMY  2012   BALLOON DILATION N/A 09/06/2014   Procedure: Larrie Kass DILATION;  Surgeon: [name], MD;  Location: [facility];  Service: Endoscopy;  Laterality: N/A;   BREAST LUMPECTOMY Left    DILATION AND CURETTAGE OF UTERUS  yrs ago   ESOPHAGEAL DILATION     ESOPHAGOGASTRODUODENOSCOPY (EGD) WITH PROPOFOL N/A 09/06/2014   Procedure: ESOPHAGOGASTRODUODENOSCOPY (EGD) WITH PROPOFOL;  Surgeon: [name], MD;  Location: [facility];  Service: Endoscopy;  Laterality: N/A;   HYSTEROSCOPY WITH D & C N/A 12/08/2018   Procedure: DILATATION AND CURETTAGE /HYSTEROSCOPY;  Surgeon: Dian Queen, MD;  Location: [facility];  Service: Gynecology;  Laterality: N/A;   TONSILLECTOMY     TUBAL LIGATION     VIDEO BRONCHOSCOPY Bilateral 08/23/2015   Procedure: VIDEO BRONCHOSCOPY WITH FLUORO;  Surgeon: Javier Glazier, MD;  Location: [facility];  Service: Cardiopulmonary;  Laterality: Bilateral;    Current Outpatient Medications  Medication Sig Dispense Refill   amoxicillin (AMOXIL) 500 MG capsule Take 500 mg by mouth 3 (three) times daily.     cholecalciferol (VITAMIN D) 1000 units tablet Take 1,000 Units by mouth daily. Reported on 07/01/2015     COLLAGEN PO Take by mouth in the morning and at bedtime.     cyclobenzaprine (FLEXERIL) 10 MG tablet Take 10 mg by mouth at bedtime.     dicyclomine (BENTYL) 20 MG tablet Take 1 tablet (20 mg total) by mouth 2 (two) times daily as needed for spasms (Abdominal pain). 60 tablet 11   estradiol (ESTRACE) 1 MG tablet Take 1 mg by mouth daily.     famotidine (PEPCID) 20 MG tablet Take 1 tablet (20 mg total) by mouth at bedtime. 30  tablet 11   HYDROcodone-acetaminophen (NORCO/VICODIN) 5-325 MG tablet Take 1 tablet by mouth every 4 (four) hours as needed.     Linaclotide (LINZESS) 290 MCG CAPS capsule Take 1 capsule (290 mcg total) by mouth every morning. (Patient taking differently: Take 290 mcg by mouth as needed.) 30 capsule 5   metoCLOPramide (REGLAN) 5 MG tablet TAKE 1 TABLET BY MOUTH 2 (TWO) TIMES DAILY AT 10 AM AND 4 PM. (Patient taking differently: Take 5 mg by mouth 2 (two) times daily.) 180 tablet 1   pantoprazole (PROTONIX) 40 MG tablet Take 1 tablet (40 mg total) by mouth daily. 30 tablet 11   progesterone (PROMETRIUM) 100 MG capsule Take 100 mg by mouth at bedtime.      temazepam (RESTORIL)  30 MG capsule Take 2 capsules (60 mg total) by mouth at bedtime. 60 capsule 1   thiamine (VITAMIN B-1) 50 MG tablet Take 50 mg by mouth daily.     traZODone (DESYREL) 50 MG tablet Take 50 mg by mouth at bedtime.     vitamin C (ASCORBIC ACID) 500 MG tablet Take 500 mg by mouth daily.     No current facility-administered medications for this visit.    Allergies as of 09/01/2021 - Review Complete 09/01/2021  Allergen Reaction Noted   Biaxin [clarithromycin] Other (See Comments)    Erythromycin     Soy allergy Other (See Comments) 04/27/2012   Zithromax [azithromycin]  04/27/2012   Zofran [ondansetron hcl]  08/20/2014    Family History  Problem Relation Age of Onset   Lymphoma Father        lymphoma   Cirrhosis Father        heavy drinker   Diabetes Maternal Grandmother    Colon cancer Maternal Grandfather    Cancer Maternal Grandfather        liver ca ?   Esophageal cancer Neg Hx    Rectal cancer Neg Hx    Stomach cancer Neg Hx     Social History   Socioeconomic History   Marital status: Married    Spouse name: Not on file   Number of children: 2   Years of education: Not on file   Highest education level: Not on file  Occupational History   Occupation: retired  Tobacco Use   Smoking status: Never     Passive exposure: Yes   Smokeless tobacco: Never   Tobacco comments:    Father growing up & daughter currently  Vaping Use   Vaping Use: Never used  Substance and Sexual Activity   Alcohol use: No   Drug use: No   Sexual activity: Not on file  Other Topics Concern   Not on file  Social History Narrative   Originally from [place]. She moved to [place] in the 1980s. She is a Orthoptist. She has also traveled to [place], [place], & [place]. No pets currently. No bird exposure. 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 Enjoys spending time with family & reading.    Social Determinants of Health   Financial Resource Strain: Not on file  Food Insecurity: Not on file  Transportation Needs: Not on file  Physical Activity: Not on file  Stress: Not on file  Social Connections: Not on file  Intimate Partner Violence: Not on file    Review of Systems:    Constitutional: No weight loss, fever or chills Cardiovascular: No chest pain   Respiratory: No SOB  Gastrointestinal: See HPI and otherwise negative   Physical Exam:  Vital signs: BP 114/66   Pulse 76   Ht '5\' 7"'$  (1.702 m)   Wt 136 lb (61.7 kg)   SpO2 99%   BMI 21.30 kg/m    Constitutional:   Caucasian female appears to be in NAD, Well developed, Well nourished, alert and cooperative Respiratory: Respirations even and unlabored. Lungs clear to auscultation bilaterally.   No wheezes, crackles, or rhonchi.  Cardiovascular: Normal S1, S2. No MRG. Regular rate and rhythm. No peripheral edema, cyanosis or pallor.  Gastrointestinal:  Soft, nondistended, nontender. No rebound or guarding. Increased BS all four quadrants. No appreciable masses or hepatomegaly. Rectal:  Not performed.  Psychiatric: Demonstrates good judgement and reason without abnormal affect or behaviors.  RELEVANT LABS  AND IMAGING: CBC    Component Value Date/Time   WBC 5.7 03/26/2021 1636   RBC 4.42 03/26/2021 1636   HGB 13.7  03/26/2021 1636   HGB 13.1 10/02/2019 1257   HGB 13.7 12/03/2016 1445   HCT 40.6 03/26/2021 1636   HCT 41.3 12/03/2016 1445   PLT 132.0 (L) 03/26/2021 1636   PLT 154 10/02/2019 1257   PLT 118 (L) 12/03/2016 1445   MCV 91.9 03/26/2021 1636   MCV 89.3 12/03/2016 1445   MCH 30.9 11/27/2019 1304   MCHC 33.7 03/26/2021 1636   RDW 12.7 03/26/2021 1636   RDW 11.9 12/03/2016 1445   LYMPHSABS 1.8 03/26/2021 1636   LYMPHSABS 1.8 12/03/2016 1445   MONOABS 0.4 03/26/2021 1636   MONOABS 0.4 12/03/2016 1445   EOSABS 0.1 03/26/2021 1636   EOSABS 0.1 12/03/2016 1445   BASOSABS 0.0 03/26/2021 1636   BASOSABS 0.1 12/03/2016 1445    CMP     Component Value Date/Time   NA 136 03/26/2021 1636   NA 128 (L) 12/03/2016 1445   K 4.4 03/26/2021 1636   K 4.2 12/03/2016 1445   CL 99 03/26/2021 1636   CO2 25 03/26/2021 1636   CO2 27 12/03/2016 1445   GLUCOSE 87 03/26/2021 1636   GLUCOSE 96 12/03/2016 1445   BUN 20 03/26/2021 1636   BUN 11.4 12/03/2016 1445   CREATININE 1.15 (H) 03/26/2021 1636   CREATININE 1.1 12/03/2016 1445   CALCIUM 9.7 03/26/2021 1636   CALCIUM 9.4 12/03/2016 1445   PROT 6.2 03/26/2021 1636   PROT 6.3 (L) 12/03/2016 1445   ALBUMIN 4.0 11/27/2019 1304   ALBUMIN 4.3 12/03/2016 1445   AST 20 03/26/2021 1636   AST 18 10/02/2019 1257   AST 17 12/03/2016 1445   ALT 15 03/26/2021 1636   ALT 14 10/02/2019 1257   ALT 12 12/03/2016 1445   ALKPHOS 123 11/27/2019 1304   ALKPHOS 126 12/03/2016 1445   BILITOT 0.5 03/26/2021 1636   BILITOT 0.5 10/02/2019 1257   BILITOT 0.50 12/03/2016 1445   GFRNONAA 54 (L) 11/27/2019 1304   GFRNONAA 42 (L) 10/02/2019 1257   GFRAA 49 (L) 10/02/2019 1257    Assessment: 1.  Generalized abdominal pain: Throughout her abdomen with recently normal EGD and CT scan as well as labs, patient concerned that something else is going on especially given her worsening constipation lately; most likely known IBS-C 2.  Change in bowel habits: Worsening  constipation 3.  IBS-C 4.  History of B-cell lymphoma  Plan: 1.  Patient and her husband are very worried that something is hiding in her bowel like colon cancer given that she has had such a difficult time with diagnosis of Lymphoma in the past and had similar symptoms.  Given ongoing symptoms over the past 6 months and recently normal CT as well as EGD, will complete work-up with a colonoscopy.  Patient was scheduled with the next Dr. Carlean Purl per her request after being told that Dr. Ardis Hughs was on extended leave.  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  Hopefully this will offer her some reassurance going forward. 2.  Scheduled patient for colonoscopy in the [facility] with Dr. Carlean Purl.  Did provide the patient a detailed list of risks for the procedure and she agrees to proceed. 3.  Discussed patient's IBS-C and how this could be adding to her bloating given that she only takes her Linzess every 3 days, patient is unwilling to  change this regimen or try decreased dose more frequently or any other combination of medicines. 4.  Could consider SIBO testing in the future if everything else is normal. 5.  Patient to follow in clinic per recommendations from Dr. Carlean Purl after time of procedure.  Ellouise Newer, PA-C [facility] 09/01/2021, 3:22 PM  Cc: Velna Hatchet, MD

## 2021-09-01 NOTE — Patient Instructions (Signed)
You have been scheduled for a colonoscopy. Please follow written instructions given to you at your visit today.  Please pick up your prep supplies at the pharmacy within the next 1-3 days. If you use inhalers (even only as needed), please bring them with you on the day of your procedure.  _______________________________________________________  If you are age 73 or older, your body mass index should be between 23-30. Your Body mass index is 21.3 kg/m. If this is out of the aforementioned range listed, please consider follow up with your Primary Care Provider.  If you are age 71 or younger, your body mass index should be between 19-25. Your Body mass index is 21.3 kg/m. If this is out of the aformentioned range listed, please consider follow up with your Primary Care Provider.   ________________________________________________________  The [facility] providers would like to encourage you to use [facility] to communicate with providers for non-urgent requests or questions.  Due to long hold times on the telephone, sending your provider a message by [facility] may be a faster and more efficient way to get a response.  Please allow 48 business hours for a response.  Please remember that this is for non-urgent requests.  _______________________________________________________

## 2021-09-02 ENCOUNTER — Encounter: Payer: Self-pay | Admitting: Internal Medicine

## 2021-09-08 ENCOUNTER — Encounter: Payer: Self-pay | Admitting: Internal Medicine

## 2021-09-08 ENCOUNTER — Ambulatory Visit (AMBULATORY_SURGERY_CENTER): Payer: PPO | Admitting: Internal Medicine

## 2021-09-08 VITALS — BP 125/79 | HR 83 | Temp 96.0°F | Resp 14 | Ht 67.0 in | Wt 136.0 lb

## 2021-09-08 DIAGNOSIS — D122 Benign neoplasm of ascending colon: Secondary | ICD-10-CM | POA: Diagnosis not present

## 2021-09-08 DIAGNOSIS — K642 Third degree hemorrhoids: Secondary | ICD-10-CM | POA: Diagnosis not present

## 2021-09-08 DIAGNOSIS — R194 Change in bowel habit: Secondary | ICD-10-CM

## 2021-09-08 DIAGNOSIS — K635 Polyp of colon: Secondary | ICD-10-CM | POA: Diagnosis not present

## 2021-09-08 DIAGNOSIS — R1084 Generalized abdominal pain: Secondary | ICD-10-CM | POA: Diagnosis not present

## 2021-09-08 MED ORDER — SODIUM CHLORIDE 0.9 % IV SOLN: 500.0000 mL | INTRAVENOUS | Status: DC

## 2021-09-08 NOTE — Progress Notes (Signed)
History and Physical Interval Note:  09/08/2021 8:03 AM  Shannon Barajas  has presented today for endoscopic procedure(s), with the diagnosis of  Encounter Diagnosis  Name Primary?   Change in bowel habit Yes  .  The various methods of evaluation and treatment have been discussed with the patient and/or family. After consideration of risks, benefits and other options for treatment, the patient has consented to  the endoscopic procedure(s).   The patient's history has been reviewed, patient examined, no change in status, stable for endoscopic procedure(s).  I have reviewed the patient's chart and labs.  Questions were answered to the patient's satisfaction.     Gatha Mayer, MD, [facility]

## 2021-09-08 NOTE — Progress Notes (Signed)
Patient complaining of abdominal discomfort but she is passing air and belching.  Stomach is soft and not distended.  Patient stated she is ready for discharge and will call if anything gets worse.  I advised against eating until she's able to pass more air and feel more comfortable.  Dr. Carlean Purl is aware and agrees with discharge home.

## 2021-09-08 NOTE — Progress Notes (Signed)
Pt's states no medical or surgical changes since previsit or office visit. 

## 2021-09-08 NOTE — Progress Notes (Signed)
Report to pacu rn. Vss. Care resumed by rn. 

## 2021-09-08 NOTE — Progress Notes (Signed)
Called to room to assist during endoscopic procedure.  Patient ID and intended procedure confirmed with present staff. Received instructions for my participation in the procedure from the performing physician.  

## 2021-09-08 NOTE — Patient Instructions (Addendum)
I found and removed one small benign-appearing polyp.It will be analyzed and I will let you know what it was. Saw your hemorrhoids.  I do think you have IBS.  As we discussed I am going to refer you for pelvic floor physical therapy. Please give that a try - I believe it can help you feel better.  I appreciate the opportunity to care for you. Gatha Mayer, MD, [facility]   Handouts Provided:  Polyps  YOU HAD AN ENDOSCOPIC PROCEDURE TODAY AT [place]:   Refer to the procedure report that was given to you for any specific questions about what was found during the examination.  If the procedure report does not answer your questions, please call your gastroenterologist to clarify.  If you requested that your care partner not be given the details of your procedure findings, then the procedure report has been included in a sealed envelope for you to review at your convenience later.  YOU SHOULD EXPECT: Some feelings of bloating in the abdomen. Passage of more gas than usual.  Walking can help get rid of the air that was put into your GI tract during the procedure and reduce the bloating. If you had a lower endoscopy (such as a colonoscopy or flexible sigmoidoscopy) you may notice spotting of blood in your stool or on the toilet paper. If you underwent a bowel prep for your procedure, you may not have a normal bowel movement for a few days.  Please Note:  You might notice some irritation and congestion in your nose or some drainage.  This is from the oxygen used during your procedure.  There is no need for concern and it should clear up in a day or so.  SYMPTOMS TO REPORT IMMEDIATELY:  Following lower endoscopy (colonoscopy or flexible sigmoidoscopy):  Excessive amounts of blood in the stool  Significant tenderness or worsening of abdominal pains  Swelling of the abdomen that is new, acute  Fever of 100F or higher  For urgent or emergent issues, a gastroenterologist can be  reached at any hour by calling [phone]. Do not use MyChart messaging for urgent concerns.    DIET:  We do recommend a small meal at first, but then you may proceed to your regular diet.  Drink plenty of fluids but you should avoid alcoholic beverages for 24 hours.  ACTIVITY:  You should plan to take it easy for the rest of today and you should NOT DRIVE or use heavy machinery until tomorrow (because of the sedation medicines used during the test).    FOLLOW UP: Our staff will call the number listed on your records the next business day following your procedure.  We will call around 7:15- 8:00 am to check on you and address any questions or concerns that you may have regarding the information given to you following your procedure. If we do not reach you, we will leave a message.  If you develop any symptoms (ie: fever, flu-like symptoms, shortness of breath, cough etc.) before then, please call [phone].  If you test positive for Covid 19 in the 2 weeks post procedure, please call and report this information to [place].    If any biopsies were taken you will be contacted by phone or by letter within the next 1-3 weeks.  Please call us at [phone] if you have not heard about the biopsies in 3 weeks.    SIGNATURES/CONFIDENTIALITY: You and/or your care partner have signed paperwork which will  be entered into your electronic medical record.  These signatures attest to the fact that that the information above on your After Visit Summary has been reviewed and is understood.  Full responsibility of the confidentiality of this discharge information lies with you and/or your care-partner.

## 2021-09-08 NOTE — Op Note (Signed)
[place] Patient Name: Shannon Barajas Procedure Date: 09/08/2021 8:03 AM MRN: [id number] Endoscopist: Gatha Mayer , MD Age: 71 Referring MD:  Date of Birth: [birth year] Gender: Female Account #: [id number] Procedure:                Colonoscopy Indications:              Abdominal pain, Change in bowel habits Medicines:                Monitored Anesthesia Care Procedure:                Pre-Anesthesia Assessment:                           - Prior to the procedure, a History and Physical                            was performed, and patient medications and                            allergies were reviewed. The patient's tolerance of                            previous anesthesia was also reviewed. The risks                            and benefits of the procedure and the sedation                            options and risks were discussed with the patient.                            All questions were answered, and informed consent                            was obtained. Prior Anticoagulants: The patient has                            taken no previous anticoagulant or antiplatelet                            agents. ASA Grade Assessment: II - A patient with                            mild systemic disease. After reviewing the risks                            and benefits, the patient was deemed in                            satisfactory condition to undergo the procedure.                           After obtaining informed consent, the colonoscope  was passed under direct vision. Throughout the                            procedure, the patient's blood pressure, pulse, and                            oxygen saturations were monitored continuously. The                            CF [id number] #[id number] was introduced through the anus                            and advanced to the the terminal ileum, with                            identification of the  appendiceal orifice and IC                            valve. The colonoscopy was performed without                            difficulty. The patient tolerated the procedure                            well. The quality of the bowel preparation was                            excellent. The terminal ileum, ileocecal valve,                            appendiceal orifice, and rectum were photographed. Scope In: 8:19:54 AM Scope Out: 8:33:06 AM Scope Withdrawal Time: 0 hours 10 minutes 17 seconds  Total Procedure Duration: 0 hours 13 minutes 12 seconds  Findings:                 Hemorrhoids were found on perianal exam.                           The digital rectal exam findings include abnormal                            functional exam - see impression.                           A 5 mm polyp was found in the ascending colon. The                            polyp was sessile. The polyp was removed with a                            cold snare. Resection and retrieval were complete.                            Verification of patient identification  for the                            specimen was done. Estimated blood loss was minimal.                           The terminal ileum appeared normal.                           External and internal hemorrhoids were found. The                            hemorrhoids were Grade III (internal hemorrhoids                            that prolapse but require manual reduction).                           The exam was otherwise without abnormality on                            direct and retroflexion views. Complications:            No immediate complications. Estimated Blood Loss:     Estimated blood loss was minimal. Impression:               - Hemorrhoids found on perianal exam.                           - Abnormal functional exam - see impression found                            on digital rectal exam.                           - One 5 mm polyp in the  ascending colon, removed                            with a cold snare. Resected and retrieved.                           - The examined portion of the ileum was normal.                           - External and internal hemorrhoids.                           - FUNCTIONAL RECTAL EXAM - abnormal voluntary tone                            - gluteal contraction not so much anal sphincter,                            increased descent w/ simulated defeaction, suspect  small rectocele also                           - The examination was otherwise normal on direct                            and retroflexion views. Recommendation:           - Patient has a contact number available for                            emergencies. The signs and symptoms of potential                            delayed complications were discussed with the                            patient. Return to normal activities tomorrow.                            Written discharge instructions were provided to the                            patient.                           - Resume previous diet.                           - Continue present medications.                           - Await pathology results.                           - No recommendation at this time regarding repeat                            colonoscopy.                           - OFFICE WILL REFER TO PELVIC FLOOR PHYSICAL                            THERAPY RE: ABNORMAL DEFECATION, ABDOMINAL PAIN Gatha Mayer, MD 09/08/2021 8:43:20 AM This report has been signed electronically.

## 2021-09-09 ENCOUNTER — Telehealth: Payer: Self-pay

## 2021-09-09 NOTE — Telephone Encounter (Signed)
Left message

## 2021-09-10 ENCOUNTER — Other Ambulatory Visit: Payer: Self-pay

## 2021-09-10 DIAGNOSIS — R109 Unspecified abdominal pain: Secondary | ICD-10-CM

## 2021-09-10 DIAGNOSIS — R198 Other specified symptoms and signs involving the digestive system and abdomen: Secondary | ICD-10-CM

## 2021-09-15 ENCOUNTER — Encounter: Payer: Self-pay | Admitting: Internal Medicine

## 2021-10-06 ENCOUNTER — Ambulatory Visit: Payer: PPO | Admitting: Physical Therapy

## 2021-10-09 DIAGNOSIS — Z1231 Encounter for screening mammogram for malignant neoplasm of breast: Secondary | ICD-10-CM | POA: Diagnosis not present

## 2021-10-09 DIAGNOSIS — R29890 Loss of height: Secondary | ICD-10-CM | POA: Diagnosis not present

## 2021-10-09 DIAGNOSIS — K9089 Other intestinal malabsorption: Secondary | ICD-10-CM | POA: Diagnosis not present

## 2021-10-09 DIAGNOSIS — N958 Other specified menopausal and perimenopausal disorders: Secondary | ICD-10-CM | POA: Diagnosis not present

## 2021-10-09 DIAGNOSIS — Z6821 Body mass index (BMI) 21.0-21.9, adult: Secondary | ICD-10-CM | POA: Diagnosis not present

## 2021-10-09 DIAGNOSIS — K219 Gastro-esophageal reflux disease without esophagitis: Secondary | ICD-10-CM | POA: Diagnosis not present

## 2021-10-09 DIAGNOSIS — M816 Localized osteoporosis [Lequesne]: Secondary | ICD-10-CM | POA: Diagnosis not present

## 2021-10-09 DIAGNOSIS — R300 Dysuria: Secondary | ICD-10-CM | POA: Diagnosis not present

## 2021-10-09 DIAGNOSIS — Z124 Encounter for screening for malignant neoplasm of cervix: Secondary | ICD-10-CM | POA: Diagnosis not present

## 2021-10-29 DIAGNOSIS — L821 Other seborrheic keratosis: Secondary | ICD-10-CM | POA: Diagnosis not present

## 2021-10-29 DIAGNOSIS — L853 Xerosis cutis: Secondary | ICD-10-CM | POA: Diagnosis not present

## 2021-10-29 DIAGNOSIS — D225 Melanocytic nevi of trunk: Secondary | ICD-10-CM | POA: Diagnosis not present

## 2021-11-18 ENCOUNTER — Ambulatory Visit: Payer: PPO

## 2021-11-21 NOTE — Therapy (Unsigned)
OUTPATIENT PHYSICAL THERAPY FEMALE PELVIC EVALUATION   Patient Name: Shannon Barajas MRN: [id number] DOB:September 15, 1950, 71 y.o., female Today's Date: 11/24/2021   PT End of Session - 11/24/21 1519     Visit Number 1    Date for PT Re-Evaluation 02/16/22    Authorization Type [employer]    Authorization - Visit Number 1    Authorization - Number of Visits 10    PT Start Time 1400    PT Stop Time 1445    PT Time Calculation (min) 45 min    Activity Tolerance Patient tolerated treatment well;No increased pain    Behavior During Therapy WFL for tasks assessed/performed             Past Medical History:  Diagnosis Date   Allergy    SEASONAL   Anemia    Arthritis    Cataract    BILATERAL   Chronic interstitial cystitis    Chronic kidney disease (CKD), stage III (moderate) ([place]) 05/25/2014   nephrology dr Clover Mealy q year   GERD (gastroesophageal reflux disease)    H/O: iron deficiency anemia    Hemorrhoids 07/26/2013   Hiatal hernia    small    Insomnia, unspecified    Interstitial cystitis    Irritable bowel syndrome    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   Non Hodgkin's lymphoma ([place]) 04/19/2013   Osteoporosis    PONV (postoperative nausea and vomiting)    likes phenergan   Raynaud's syndrome    Splenomegaly    Stricture and stenosis of esophagus    Thrombocytopenia, unspecified ([place])    Unspecified constipation    Past Surgical History:  Procedure Laterality Date   APPENDECTOMY  2012   APPENDECTOMY  2012   BALLOON DILATION N/A 09/06/2014   Procedure: Larrie Kass DILATION;  Surgeon: [name], MD;  Location: [facility];  Service: Endoscopy;  Laterality: N/A;   BREAST LUMPECTOMY Left    DILATION AND CURETTAGE OF UTERUS  yrs ago   ESOPHAGEAL DILATION     ESOPHAGOGASTRODUODENOSCOPY (EGD) WITH PROPOFOL N/A 09/06/2014   Procedure: ESOPHAGOGASTRODUODENOSCOPY (EGD) WITH PROPOFOL;  Surgeon: [name], MD;  Location: [facility];  Service:  Endoscopy;  Laterality: N/A;   HYSTEROSCOPY WITH D & C N/A 12/08/2018   Procedure: DILATATION AND CURETTAGE /HYSTEROSCOPY;  Surgeon: Dian Queen, MD;  Location: [place];  Service: Gynecology;  Laterality: N/A;   TONSILLECTOMY     TUBAL LIGATION     VIDEO BRONCHOSCOPY Bilateral 08/23/2015   Procedure: VIDEO BRONCHOSCOPY WITH FLUORO;  Surgeon: Javier Glazier, MD;  Location: [facility];  Service: Cardiopulmonary;  Laterality: Bilateral;   Patient Active Problem List   Diagnosis Date Noted   Abdominal bloating 12/05/2019   Preventive measure 10/02/2019   Weight loss 10/02/2019   Other fatigue 10/02/2019   Uterine mass 11/18/2018   Dysuria 11/15/2018   Generalized abdominal pain 11/15/2018   Elevated liver enzymes 06/04/2017   Multiple lung nodules on CT 08/15/2015   Quality of life palliative care encounter 06/07/2015   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   Left breast lump 11/15/2014   Dysphagia 08/11/2014   Chronic kidney disease (CKD), stage II (mild) 05/25/2014   Acute dystonic reaction due to drugs 05/25/2014   Bilateral lower extremity edema 05/25/2014   Chronic kidney disease, stage III (moderate) ([place]) 09/05/2013   Hemorrhoids 07/26/2013   Hyponatremia 07/26/2013   Leg edema 06/23/2013   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  Thrombocytopenia ([place]) 10/10/2009   Other constipation 10/10/2009   NONSPECIFIC ABN FINDING RAD & OTH EXAM GI TRACT 09/06/2009   RECTAL BLEEDING 07/10/2009   HIP PAIN, LEFT 08/28/2008   SPRAIN&STRAIN OTHER SPECIFIED SITES KNEE&LEG 08/28/2008   OTHER NONTHROMBOCYTOPENIC PURPURAS 07/27/2008   WEIGHT LOSS, ABNORMAL 07/27/2008   DERMATOPHYTOSIS OF NAIL 06/04/2008   GERD 05/24/2008   Rectal bleeding 05/24/2008   HEMORRHOIDS 05/22/2008   ESOPHAGEAL STRICTURE 05/22/2008   HIATAL HERNIA 05/22/2008   PLANTAR WART 08/04/2007   INSOMNIA, SEVERE 08/04/2007   RAYNAUD'S SYNDROME 08/30/2006    IRRITABLE BOWEL SYNDROME 08/30/2006   DIARRHEA, CHRONIC 08/30/2006    PCP: Velna Hatchet, MD  REFERRING PROVIDER:   Gatha Mayer, MD    REFERRING DIAG:  R10.9 (ICD-10-CM) - Abdominal pain, unspecified abdominal location  R19.8 (ICD-10-CM) - Abnormal defecation    THERAPY DIAG:  Other lack of coordination  Abdominal pain, unspecified abdominal location  Muscle weakness (generalized)  Rationale for Evaluation and Treatment: Rehabilitation  ONSET DATE: 04/2021  SUBJECTIVE:                                                                                                                                                                                           SUBJECTIVE STATEMENT: Patient had a colonoscopy and the muscles was week. Patient had IBS with constipation. She started to have abdominal pain. Patient has to take medicine to go to the bathroom. Sometimes has the need to urinate and unable to. Patient gets pain in the lower abdomen. Right now she has a UTI. Patient on a stomach spasm pill. She has interstitial cystitis. She is gluten and diary free. Patient has increased stomach bloating.  Fluid intake: Yes: decaf coffee, water    PAIN:  Are you having pain? Yes NPRS scale: 5/10 Pain location:  lower abdominal, suprapubic  Pain type: nagging pain Pain description: intermittent   Aggravating factors: random pain Relieving factors: randomly  PRECAUTIONS: Other: non- hodgkin's lymphoma ; ostoeporosis  WEIGHT BEARING RESTRICTIONS: No  FALLS:  Has patient fallen in last 6 months? No  LIVING ENVIRONMENT: Lives with: lives with their spouse  OCCUPATION: retired; floor exercises  PLOF: Independent  PATIENT GOALS: reduce pain and bloating  PERTINENT HISTORY:  Interstitial cystitis; chronic kidney disease; Hemorrhoids; IBS; Appendectomy; osteoporosis; Raynauds; Non hodgkins lymphoma  BOWEL MOVEMENT: Pain with bowel movement: Yes; after bowel movement Type of  bowel movement:Type (Bristol Stool Scale) Type 5 or 6, Frequency when she takes the Linzess, and Strain Yes Fully empty rectum: No, bloating Leakage: yes, after a bowel movement a brown liquid comes out Pads: 1 pad per day Fiber supplement:  Yes: Linzess  URINATION: Pain with urination: No, pain right now du eto UTI Fully empty bladder: No, hard to initiate the urine stream Stream:  weak to average Urgency: No Frequency: daytime: every hour; nighttime: 1 time Leakage:  none  INTERCOURSE: Pain with intercourse:  very painful and husband is diabetic Ability to have vaginal penetration:  No  PREGNANCY: Vaginal deliveries 2 Tearing No C-section deliveries 0 Currently pregnant No  PROLAPSE: None   OBJECTIVE:   DIAGNOSTIC FINDINGS:  none  PATIENT SURVEYS:  none  COGNITION: Overall cognitive status: Within functional limits for tasks assessed     POSTURE: rounded shoulders, forward head, and increased thoracic kyphosis  PELVIC ALIGNMENT:  LUMBARAROM/PROM: Lumbar ROM decreased by 25%   LOWER EXTREMITY ROM: bilateral hip ROM is full   (Blank rows = not tested)  LOWER EXTREMITY MMT:  MMT Right eval Left eval  Hip extension 4/5 4/5  Hip abduction 4/5 4/5   PALPATION:   General  contracts the upper abdominals and not the lower. Tenderness though out the abdomen.                 External Perineal Exam tenderness located on the ischiocavernosus, urogenital diaphragm, levator ani                             Internal Pelvic Floor not assessed today  Patient confirms identification and approves PT to assess internal pelvic floor and treatment No, She presently has a UTI and deferred. Will have therapist do in the future.   PELVIC MMT:   MMT eval  Vaginal   Internal Anal Sphincter   External Anal Sphincter   Puborectalis   Diastasis Recti   (Blank rows = not tested)        TONE: Not assessed today   TODAY'S TREATMENT:                                                                                                                               DATE: 11/24/2021  EVAL See below for treatment   PATIENT EDUCATION:  11/24/2021 Education details: Access Code: [email or web address]; abdominal massage, sitting with legs uncrossed Person educated: Patient Education method: Explanation, Demonstration, Tactile cues, Verbal cues, and Handouts Education comprehension: verbalized understanding, returned demonstration, verbal cues required, tactile cues required, and needs further education  HOME EXERCISE PROGRAM: 11/24/2021 Access Code: [email or web address] URL: [email or web address] Date: 11/24/2021 Prepared by: Earlie Counts  Program Notes try to sit with legs not crossed   Exercises - Supine Abdominal Wall Massage  - 1 x daily - 7 x weekly - 1 sets - 10 reps - Supine Transversus Abdominis Bracing - Hands on Thighs  - 1 x daily - 7 x weekly - 1 sets - 10 reps - 5 sec hold  ASSESSMENT:  CLINICAL IMPRESSION: Patient is a 71 y.o. female who was seen today for physical therapy evaluation and treatment for  abdominal pain and abnormal defecation.Patient reports difficulty with abdominal pain and will leak stool after a bowel movement. Patient has a history of IBS, IC and non hodgkin lymphoma.  She reports her intermittent abdominal pain 5/10 and comes on randomly. She has tenderness throughout the abdomen, tightness in the upper abdomen and difficulty with contracting the lower abdominal. She will only have a bowel movement when she takes her Linzess and unable to leave the house often due to not knowing when she will have one. Patient has weakness in the hips and abdomen. She has tenderness located in the levator ani. She deferred assessment of pelvic floor strength due to having a UTI at the time. Patient will benefit from skilled therapy to reduce abdominal pain, improve pelvic floor coordination and increase strength.   OBJECTIVE IMPAIRMENTS: decreased coordination,  decreased strength, increased fascial restrictions, and pain.   ACTIVITY LIMITATIONS: continence and toileting  PARTICIPATION LIMITATIONS: shopping and community activity  PERSONAL FACTORS: Age, Fitness, and 3+ comorbidities: Interstitial cystitis; chronic kidney disease; Hemorrhoids; IBS; Appendectomy; osteoporosis; Raynauds; Non hodgkins lymphoma  are also affecting patient's functional outcome.   REHAB POTENTIAL: Excellent  CLINICAL DECISION MAKING: Evolving/moderate complexity  EVALUATION COMPLEXITY: Moderate   GOALS: Goals reviewed with patient? Yes  SHORT TERM GOALS: Target date: 12/22/2021  Patient educated on abdominal massage to reduce trigger points and improve peristalic motion.  Baseline: Goal status: INITIAL  2.  Patient able to perform diaphragmatic breathing to reduce tension in the pelvic floor.  Baseline:  Goal status: INITIAL  3.  Patient able to contract the upper and lower abdominals together.  Baseline:  Goal status: INITIAL  4.  Patient reports her abdominal pain has decreased >/= 25%.  Baseline:  Goal status: INITIAL   LONG TERM GOALS: Target date: 02/16/2022   Patient independent with advanced HEP for core and pelvic floor to reduce fecal leakage and abdominal pain.  Baseline:  Goal status: INITIAL  2.  Patient reports her abdominal pain decreased </= 1/10 due to correct contraction and reduction of trigger poinrs.  Baseline:  Goal status: INITIAL  3.  Pelvic floor strength is increased due to stool leakage after a bowel movement decreased >/= 50%.  Baseline:  Goal status: INITIAL  4.  Patient educated on correct toileting to assist her to feel she has fully emptied her rectum.  Baseline:  Goal status: INITIAL  5.  Patient reports her abdominal pain decreased </= 1/10 to improve her overall quality of life.  Baseline:  Goal status: INITIAL   PLAN:  PT FREQUENCY: 1x/week  PT DURATION: 12 weeks  PLANNED INTERVENTIONS: Therapeutic  exercises, Therapeutic activity, Neuromuscular re-education, Patient/Family education, Self Care, Aquatic Therapy, Dry Needling, Electrical stimulation, Cryotherapy, Moist heat, Taping, Biofeedback, and Manual therapy  PLAN FOR NEXT SESSION: manual work to abdomen, assessment of pelvic floor, diaphragmatic breathing, correct abdominal contraction   Earlie Counts, PT 11/24/21 3:20 PM

## 2021-11-24 ENCOUNTER — Other Ambulatory Visit: Payer: Self-pay

## 2021-11-24 ENCOUNTER — Ambulatory Visit: Payer: PPO | Attending: Internal Medicine | Admitting: Physical Therapy

## 2021-11-24 ENCOUNTER — Encounter: Payer: Self-pay | Admitting: Physical Therapy

## 2021-11-24 DIAGNOSIS — R278 Other lack of coordination: Secondary | ICD-10-CM | POA: Insufficient documentation

## 2021-11-24 DIAGNOSIS — R198 Other specified symptoms and signs involving the digestive system and abdomen: Secondary | ICD-10-CM | POA: Insufficient documentation

## 2021-11-24 DIAGNOSIS — R109 Unspecified abdominal pain: Secondary | ICD-10-CM | POA: Diagnosis not present

## 2021-11-24 DIAGNOSIS — N390 Urinary tract infection, site not specified: Secondary | ICD-10-CM | POA: Diagnosis not present

## 2021-11-24 DIAGNOSIS — M6281 Muscle weakness (generalized): Secondary | ICD-10-CM | POA: Insufficient documentation

## 2021-11-26 DIAGNOSIS — D696 Thrombocytopenia, unspecified: Secondary | ICD-10-CM | POA: Diagnosis not present

## 2021-11-26 DIAGNOSIS — R7989 Other specified abnormal findings of blood chemistry: Secondary | ICD-10-CM | POA: Diagnosis not present

## 2021-11-26 DIAGNOSIS — F419 Anxiety disorder, unspecified: Secondary | ICD-10-CM | POA: Diagnosis not present

## 2021-11-26 DIAGNOSIS — M8580 Other specified disorders of bone density and structure, unspecified site: Secondary | ICD-10-CM | POA: Diagnosis not present

## 2021-12-03 DIAGNOSIS — R300 Dysuria: Secondary | ICD-10-CM | POA: Diagnosis not present

## 2021-12-03 DIAGNOSIS — R1084 Generalized abdominal pain: Secondary | ICD-10-CM | POA: Diagnosis not present

## 2021-12-03 DIAGNOSIS — G4700 Insomnia, unspecified: Secondary | ICD-10-CM | POA: Diagnosis not present

## 2021-12-03 DIAGNOSIS — Z1331 Encounter for screening for depression: Secondary | ICD-10-CM | POA: Diagnosis not present

## 2021-12-03 DIAGNOSIS — R911 Solitary pulmonary nodule: Secondary | ICD-10-CM | POA: Diagnosis not present

## 2021-12-03 DIAGNOSIS — K222 Esophageal obstruction: Secondary | ICD-10-CM | POA: Diagnosis not present

## 2021-12-03 DIAGNOSIS — Z1339 Encounter for screening examination for other mental health and behavioral disorders: Secondary | ICD-10-CM | POA: Diagnosis not present

## 2021-12-03 DIAGNOSIS — D696 Thrombocytopenia, unspecified: Secondary | ICD-10-CM | POA: Diagnosis not present

## 2021-12-03 DIAGNOSIS — H6121 Impacted cerumen, right ear: Secondary | ICD-10-CM | POA: Diagnosis not present

## 2021-12-03 DIAGNOSIS — Z0000 Encounter for general adult medical examination without abnormal findings: Secondary | ICD-10-CM | POA: Diagnosis not present

## 2021-12-03 DIAGNOSIS — F419 Anxiety disorder, unspecified: Secondary | ICD-10-CM | POA: Diagnosis not present

## 2021-12-03 DIAGNOSIS — C884 Extranodal marginal zone B-cell lymphoma of mucosa-associated lymphoid tissue [MALT-lymphoma]: Secondary | ICD-10-CM | POA: Diagnosis not present

## 2021-12-24 ENCOUNTER — Ambulatory Visit: Payer: PPO | Admitting: Physical Therapy

## 2021-12-29 ENCOUNTER — Ambulatory Visit: Payer: PPO | Attending: Internal Medicine | Admitting: Physical Therapy

## 2021-12-29 ENCOUNTER — Encounter: Payer: Self-pay | Admitting: Physical Therapy

## 2021-12-29 DIAGNOSIS — R109 Unspecified abdominal pain: Secondary | ICD-10-CM | POA: Insufficient documentation

## 2021-12-29 DIAGNOSIS — R278 Other lack of coordination: Secondary | ICD-10-CM | POA: Diagnosis not present

## 2021-12-29 DIAGNOSIS — M6281 Muscle weakness (generalized): Secondary | ICD-10-CM | POA: Insufficient documentation

## 2021-12-29 NOTE — Therapy (Signed)
OUTPATIENT PHYSICAL THERAPY TREATMENT NOTE   Patient Name: Shannon Barajas MRN: [id number] DOB:Nov 25, 1950, 71 y.o., female Today's Date: 12/29/2021  PCP: Velna Hatchet, MD  REFERRING PROVIDER: Velna Hatchet, MD   END OF SESSION:   PT End of Session - 12/29/21 1447     Visit Number 2    Date for PT Re-Evaluation 02/16/22    Authorization Type [employer]    Authorization - Visit Number 2    Authorization - Number of Visits 10    PT Start Time 1884    PT Stop Time 1525    PT Time Calculation (min) 40 min    Activity Tolerance Patient tolerated treatment well;No increased pain    Behavior During Therapy WFL for tasks assessed/performed             Past Medical History:  Diagnosis Date   Allergy    SEASONAL   Anemia    Arthritis    Cataract    BILATERAL   Chronic interstitial cystitis    Chronic kidney disease (CKD), stage III (moderate) ([place]) 05/25/2014   nephrology dr Clover Mealy q year   GERD (gastroesophageal reflux disease)    H/O: iron deficiency anemia    Hemorrhoids 07/26/2013   Hiatal hernia    small    Insomnia, unspecified    Interstitial cystitis    Irritable bowel syndrome    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[place]) 11/14/2013   Non Hodgkin's lymphoma ([place]) 04/19/2013   Osteoporosis    PONV (postoperative nausea and vomiting)    likes phenergan   Raynaud's syndrome    Splenomegaly    Stricture and stenosis of esophagus    Thrombocytopenia, unspecified ([place])    Unspecified constipation    Past Surgical History:  Procedure Laterality Date   APPENDECTOMY  2012   APPENDECTOMY  2012   BALLOON DILATION N/A 09/06/2014   Procedure: Larrie Kass DILATION;  Surgeon: [name], MD;  Location: [facility];  Service: Endoscopy;  Laterality: N/A;   BREAST LUMPECTOMY Left    DILATION AND CURETTAGE OF UTERUS  yrs ago   ESOPHAGEAL DILATION     ESOPHAGOGASTRODUODENOSCOPY (EGD) WITH PROPOFOL N/A 09/06/2014   Procedure: ESOPHAGOGASTRODUODENOSCOPY (EGD) WITH  PROPOFOL;  Surgeon: [name], MD;  [facility]: [facility];  Service: Endoscopy;  Laterality: N/A;   HYSTEROSCOPY WITH D & C N/A 12/08/2018   Procedure: DILATATION AND CURETTAGE /HYSTEROSCOPY;  Surgeon: Dian Queen, MD;  Location: [facility] [place];  Service: Gynecology;  Laterality: N/A;   TONSILLECTOMY     TUBAL LIGATION     VIDEO BRONCHOSCOPY Bilateral 08/23/2015   Procedure: VIDEO BRONCHOSCOPY WITH FLUORO;  Surgeon: Javier Glazier, MD;  [facility];  Service: Cardiopulmonary;  Laterality: Bilateral;   Patient Active Problem List   Diagnosis Date Noted   Abdominal bloating 12/05/2019   Preventive measure 10/02/2019   Weight loss 10/02/2019   Other fatigue 10/02/2019   Uterine mass 11/18/2018   Dysuria 11/15/2018   Generalized abdominal pain 11/15/2018   Elevated liver enzymes 06/04/2017   Multiple lung nodules on CT 08/15/2015   Quality of life palliative care encounter 06/07/2015   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[place]) 11/15/2014   Left breast lump 11/15/2014   Dysphagia 08/11/2014   Chronic kidney disease (CKD), stage II (mild) 05/25/2014   Acute dystonic reaction due to drugs 05/25/2014   Bilateral lower extremity edema 05/25/2014   Chronic kidney disease, stage III (moderate) ([place]) 09/05/2013   Hemorrhoids 07/26/2013   Hyponatremia 07/26/2013  Leg edema 06/23/2013   Splenic marginal zone b-cell lymphoma ([place]) 05/30/2013   Thrombocytopenia ([place]) 10/10/2009   Other constipation 10/10/2009   NONSPECIFIC ABN FINDING RAD & OTH EXAM GI TRACT 09/06/2009   RECTAL BLEEDING 07/10/2009   HIP PAIN, LEFT 08/28/2008   SPRAIN&STRAIN OTHER SPECIFIED SITES KNEE&LEG 08/28/2008   OTHER NONTHROMBOCYTOPENIC PURPURAS 07/27/2008   WEIGHT LOSS, ABNORMAL 07/27/2008   DERMATOPHYTOSIS OF NAIL 06/04/2008   GERD 05/24/2008   Rectal bleeding 05/24/2008   HEMORRHOIDS 05/22/2008   ESOPHAGEAL STRICTURE 05/22/2008   HIATAL HERNIA 05/22/2008   PLANTAR WART  08/04/2007   INSOMNIA, SEVERE 08/04/2007   RAYNAUD'S SYNDROME 08/30/2006   IRRITABLE BOWEL SYNDROME 08/30/2006   DIARRHEA, CHRONIC 08/30/2006   REFERRING DIAG:  R10.9 (ICD-10-CM) - Abdominal pain, unspecified abdominal location  R19.8 (ICD-10-CM) - Abnormal defecation      THERAPY DIAG:  Other lack of coordination   Abdominal pain, unspecified abdominal location   Muscle weakness (generalized)   Rationale for Evaluation and Treatment: Rehabilitation   ONSET DATE: 04/2021   SUBJECTIVE:                                                                                                                                                                                            SUBJECTIVE STATEMENT: I feel good and several bouts with stomach bloating. My husband is doing my abdominal massage.  Fluid intake: Yes: decaf coffee, water     PAIN:  Are you having pain? Yes NPRS scale: 5/10 Pain location:  lower abdominal, suprapubic   Pain type: nagging pain Pain description: intermittent    Aggravating factors: random pain Relieving factors: randomly   PRECAUTIONS: Other: non- hodgkin's lymphoma ; ostoeporosis   WEIGHT BEARING RESTRICTIONS: No   FALLS:  Has patient fallen in last 6 months? No   LIVING ENVIRONMENT: Lives with: lives with their spouse   OCCUPATION: retired; floor exercises   PLOF: Independent   PATIENT GOALS: reduce pain and bloating   PERTINENT HISTORY:  Interstitial cystitis; chronic kidney disease; Hemorrhoids; IBS; Appendectomy; osteoporosis; Raynauds; Non hodgkins lymphoma   BOWEL MOVEMENT: Pain with bowel movement: Yes; after bowel movement Type of bowel movement:Type (Bristol Stool Scale) Type 5 or 6, Frequency when she takes the Linzess, and Strain Yes Fully empty rectum: No, bloating Leakage: yes, after a bowel movement a brown liquid comes out Pads: 1 pad per day Fiber supplement: Yes: Linzess   URINATION: Pain with urination: No, pain right  now du eto UTI Fully empty bladder: No, hard to initiate the urine stream Stream:  weak to average Urgency: No Frequency: daytime: every hour; nighttime: 1 time Leakage:  none   INTERCOURSE: Pain with intercourse:  very painful and husband is diabetic Ability to have vaginal penetration:  No   PREGNANCY: Vaginal deliveries 2 Tearing No C-section deliveries 0 Currently pregnant No   PROLAPSE: None     OBJECTIVE:    DIAGNOSTIC FINDINGS:  none   PATIENT SURVEYS:  none   COGNITION: Overall cognitive status: Within functional limits for tasks assessed                  POSTURE: rounded shoulders, forward head, and increased thoracic kyphosis   PELVIC ALIGNMENT:   LUMBARAROM/PROM: Lumbar ROM decreased by 25%     LOWER EXTREMITY ROM: bilateral hip ROM is full    (Blank rows = not tested)   LOWER EXTREMITY MMT:   MMT Right eval Left eval  Hip extension 4/5 4/5  Hip abduction 4/5 4/5    PALPATION:   General  contracts the upper abdominals and not the lower. Tenderness though out the abdomen.                  External Perineal Exam tenderness located on the ischiocavernosus, urogenital diaphragm, levator ani                             Internal Pelvic Floor not assessed today   Patient confirms identification and approves PT to assess internal pelvic floor and treatment No, She presently has a UTI and deferred. Will have therapist do in the future.    PELVIC MMT:   MMT eval  Vaginal    Internal Anal Sphincter    External Anal Sphincter    Puborectalis    Diastasis Recti    (Blank rows = not tested)         TONE: Not assessed today     TODAY'S TREATMENT:  12/29/2021 Manual: Soft tissue mobilization: To bilateral diaphragm Along the abdomen to release the tissue with circular massage Myofascial release: Tissue rolling of the abdomen Fascial release of the mesenteric root Fascial release along the sac of Douglas Release of the upper  abdomen Neuromuscular re-education: Down training: Diaphragmatic breathing with tactile cues, verbal cues to expand the lower rib cage then the abdomen Exercises: Strengthening: Nustep level 2 for 5 minutes while assessing patient.                                                                                                                                     PATIENT EDUCATION:  12/29/2021 Education details: Access Code: [email or web address]; abdominal massage, sitting with legs uncrossed Person educated: Patient Education method: Explanation, Demonstration, Tactile cues, Verbal cues, and Handouts Education comprehension: verbalized understanding, returned demonstration, verbal cues required, tactile cues required, and needs further education   HOME EXERCISE PROGRAM: 12/29/2021 Access Code: [email or web address] URL: [email or web address] Date: 12/29/2021 Prepared by: Earlie Counts  Program Notes try to sit with legs not crossed  Exercises - Supine Abdominal Wall Massage  - 1 x daily - 7 x weekly - 1 sets - 10 reps - Supine Transversus Abdominis Bracing - Hands on Thighs  - 1 x daily - 7 x weekly - 1 sets - 10 reps - 5 sec hold - Supine Diaphragmatic Breathing  - 2 x daily - 7 x weekly - 1 sets - 10 reps   ASSESSMENT:   CLINICAL IMPRESSION: Patient is a 71 y.o. female who was seen today for physical therapy treatment for abdominal pain and abnormal defecation.Patient has difficulty with diaphragmatic breathing. She will tighten her upper abdomen and force her lower abdomen out. Patient will take short breaths instead of long breaths. She had good intestinal sounds with the manual work. Patient husband will perform the manual work to her abdomen. Patient will benefit from skilled therapy to reduce abdominal pain, improve pelvic floor coordination and increase strength.    OBJECTIVE IMPAIRMENTS: decreased coordination, decreased strength, increased fascial restrictions, and pain.     ACTIVITY LIMITATIONS: continence and toileting   PARTICIPATION LIMITATIONS: shopping and community activity   PERSONAL FACTORS: Age, Fitness, and 3+ comorbidities: Interstitial cystitis; chronic kidney disease; Hemorrhoids; IBS; Appendectomy; osteoporosis; Raynauds; Non hodgkins lymphoma  are also affecting patient's functional outcome.    REHAB POTENTIAL: Excellent   CLINICAL DECISION MAKING: Evolving/moderate complexity   EVALUATION COMPLEXITY: Moderate     GOALS: Goals reviewed with patient? Yes   SHORT TERM GOALS: Target date: 12/22/2021   Patient educated on abdominal massage to reduce trigger points and improve peristalic motion.  Baseline: Goal status: Met 12/29/2021   2.  Patient able to perform diaphragmatic breathing to reduce tension in the pelvic floor.  Baseline:  Goal status: INITIAL   3.  Patient able to contract the upper and lower abdominals together.  Baseline:  Goal status: INITIAL   4.  Patient reports her abdominal pain has decreased >/= 25%.  Baseline:  Goal status: INITIAL     LONG TERM GOALS: Target date: 02/16/2022    Patient independent with advanced HEP for core and pelvic floor to reduce fecal leakage and abdominal pain.  Baseline:  Goal status: INITIAL   2.  Patient reports her abdominal pain decreased </= 1/10 due to correct contraction and reduction of trigger poinrs.  Baseline:  Goal status: INITIAL   3.  Pelvic floor strength is increased due to stool leakage after a bowel movement decreased >/= 50%.  Baseline:  Goal status: INITIAL   4.  Patient educated on correct toileting to assist her to feel she has fully emptied her rectum.  Baseline:  Goal status: INITIAL   5.  Patient reports her abdominal pain decreased </= 1/10 to improve her overall quality of life.  Baseline:  Goal status: INITIAL     PLAN:   PT FREQUENCY: 1x/week   PT DURATION: 12 weeks   PLANNED INTERVENTIONS: Therapeutic exercises, Therapeutic activity,  Neuromuscular re-education, Patient/Family education, Self Care, Aquatic Therapy, Dry Needling, Electrical stimulation, Cryotherapy, Moist heat, Taping, Biofeedback, and Manual therapy   PLAN FOR NEXT SESSION: manual work to abdomen, assessment of pelvic floor, diaphragmatic breathing, correct abdominal contraction   Earlie Counts, PT 12/29/21 3:29 PM

## 2022-01-05 ENCOUNTER — Encounter: Payer: PPO | Admitting: Physical Therapy

## 2022-01-21 ENCOUNTER — Ambulatory Visit: Payer: PPO | Admitting: Physical Therapy

## 2022-01-26 DIAGNOSIS — D3132 Benign neoplasm of left choroid: Secondary | ICD-10-CM | POA: Diagnosis not present

## 2022-01-26 DIAGNOSIS — H04123 Dry eye syndrome of bilateral lacrimal glands: Secondary | ICD-10-CM | POA: Diagnosis not present

## 2022-01-26 DIAGNOSIS — H43813 Vitreous degeneration, bilateral: Secondary | ICD-10-CM | POA: Diagnosis not present

## 2022-01-26 DIAGNOSIS — Z961 Presence of intraocular lens: Secondary | ICD-10-CM | POA: Diagnosis not present

## 2022-01-28 ENCOUNTER — Encounter: Payer: Self-pay | Admitting: Physical Therapy

## 2022-01-28 ENCOUNTER — Ambulatory Visit: Payer: PPO | Attending: Internal Medicine | Admitting: Physical Therapy

## 2022-01-28 DIAGNOSIS — R109 Unspecified abdominal pain: Secondary | ICD-10-CM | POA: Insufficient documentation

## 2022-01-28 DIAGNOSIS — R278 Other lack of coordination: Secondary | ICD-10-CM | POA: Insufficient documentation

## 2022-01-28 DIAGNOSIS — M6281 Muscle weakness (generalized): Secondary | ICD-10-CM | POA: Insufficient documentation

## 2022-01-28 NOTE — Therapy (Signed)
OUTPATIENT PHYSICAL THERAPY TREATMENT NOTE   Patient Name: Shannon Barajas MRN: [id number] DOB:1950/02/21, 72 y.o., female Today's Date: 01/28/2022  PCP: Velna Hatchet, MD  REFERRING PROVIDER: Velna Hatchet, MD   END OF SESSION:   PT End of Session - 01/28/22 1400     Visit Number 3    Date for PT Re-Evaluation 02/16/22    Authorization Type [employer]    Authorization - Visit Number 3    Authorization - Number of Visits 10    PT Start Time 1400    PT Stop Time 1440    PT Time Calculation (min) 40 min    Activity Tolerance Patient tolerated treatment well;No increased pain    Behavior During Therapy WFL for tasks assessed/performed             Past Medical History:  Diagnosis Date   Allergy    SEASONAL   Anemia    Arthritis    Cataract    BILATERAL   Chronic interstitial cystitis    Chronic kidney disease (CKD), stage III (moderate) ([place]) 05/25/2014   nephrology dr Clover Mealy q year   GERD (gastroesophageal reflux disease)    H/O: iron deficiency anemia    Hemorrhoids 07/26/2013   Hiatal hernia    small    Insomnia, unspecified    Interstitial cystitis    Irritable bowel syndrome    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[place]) 11/14/2013   Non Hodgkin's lymphoma ([place]) 04/19/2013   Osteoporosis    PONV (postoperative nausea and vomiting)    likes phenergan   Raynaud's syndrome    Splenomegaly    Stricture and stenosis of esophagus    Thrombocytopenia, unspecified ([place])    Unspecified constipation    Past Surgical History:  Procedure Laterality Date   APPENDECTOMY  2012   APPENDECTOMY  2012   BALLOON DILATION N/A 09/06/2014   Procedure: Larrie Kass DILATION;  Surgeon: [name], MD;  [facility];  Service: Endoscopy;  Laterality: N/A;   BREAST LUMPECTOMY Left    DILATION AND CURETTAGE OF UTERUS  yrs ago   ESOPHAGEAL DILATION     ESOPHAGOGASTRODUODENOSCOPY (EGD) WITH PROPOFOL N/A 09/06/2014   Procedure: ESOPHAGOGASTRODUODENOSCOPY (EGD) WITH  PROPOFOL;  Surgeon: [name], MD;  [facility];  Service: Endoscopy;  Laterality: N/A;   HYSTEROSCOPY WITH D & C N/A 12/08/2018   Procedure: DILATATION AND CURETTAGE /HYSTEROSCOPY;  Surgeon: Dian Queen, MD;  Location: [facility];  Service: Gynecology;  Laterality: N/A;   TONSILLECTOMY     TUBAL LIGATION     VIDEO BRONCHOSCOPY Bilateral 08/23/2015   Procedure: VIDEO BRONCHOSCOPY WITH FLUORO;  Surgeon: Javier Glazier, MD;  [facility];  Service: Cardiopulmonary;  Laterality: Bilateral;   Patient Active Problem List   Diagnosis Date Noted   Abdominal bloating 12/05/2019   Preventive measure 10/02/2019   Weight loss 10/02/2019   Other fatigue 10/02/2019   Uterine mass 11/18/2018   Dysuria 11/15/2018   Generalized abdominal pain 11/15/2018   Elevated liver enzymes 06/04/2017   Multiple lung nodules on CT 08/15/2015   Quality of life palliative care encounter 06/07/2015   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[place]) 11/15/2014   Left breast lump 11/15/2014   Dysphagia 08/11/2014   Chronic kidney disease (CKD), stage II (mild) 05/25/2014   Acute dystonic reaction due to drugs 05/25/2014   Bilateral lower extremity edema 05/25/2014   Chronic kidney disease, stage III (moderate) ([place]) 09/05/2013   Hemorrhoids 07/26/2013   Hyponatremia 07/26/2013  Leg edema 06/23/2013   Splenic marginal zone b-cell lymphoma ([place]) 05/30/2013   Thrombocytopenia ([place]) 10/10/2009   Other constipation 10/10/2009   NONSPECIFIC ABN FINDING RAD & OTH EXAM GI TRACT 09/06/2009   RECTAL BLEEDING 07/10/2009   HIP PAIN, LEFT 08/28/2008   SPRAIN&STRAIN OTHER SPECIFIED SITES KNEE&LEG 08/28/2008   OTHER NONTHROMBOCYTOPENIC PURPURAS 07/27/2008   WEIGHT LOSS, ABNORMAL 07/27/2008   DERMATOPHYTOSIS OF NAIL 06/04/2008   GERD 05/24/2008   Rectal bleeding 05/24/2008   HEMORRHOIDS 05/22/2008   ESOPHAGEAL STRICTURE 05/22/2008   HIATAL HERNIA 05/22/2008   PLANTAR WART  08/04/2007   INSOMNIA, SEVERE 08/04/2007   RAYNAUD'S SYNDROME 08/30/2006   IRRITABLE BOWEL SYNDROME 08/30/2006   DIARRHEA, CHRONIC 08/30/2006  REFERRING DIAG:  R10.9 (ICD-10-CM) - Abdominal pain, unspecified abdominal location  R19.8 (ICD-10-CM) - Abnormal defecation      THERAPY DIAG:  Other lack of coordination   Abdominal pain, unspecified abdominal location   Muscle weakness (generalized)   Rationale for Evaluation and Treatment: Rehabilitation   ONSET DATE: 04/2021   SUBJECTIVE:                                                                                                                                                                                            SUBJECTIVE STATEMENT: I do floor exercises at home.  I have not done much exercise due to husband having a stroke and being out of town. The abdominal pain has not been as bad.  Fluid intake: Yes: decaf coffee, water     PAIN:  Are you having pain? Yes NPRS scale: 5/10 Pain location:  lower abdominal, suprapubic   Pain type: nagging pain Pain description: intermittent    Aggravating factors: random pain Relieving factors: randomly   PRECAUTIONS: Other: non- hodgkin's lymphoma ; ostoeporosis   WEIGHT BEARING RESTRICTIONS: No   FALLS:  Has patient fallen in last 6 months? No   LIVING ENVIRONMENT: Lives with: lives with their spouse   OCCUPATION: retired; floor exercises   PLOF: Independent   PATIENT GOALS: reduce pain and bloating   PERTINENT HISTORY:  Interstitial cystitis; chronic kidney disease; Hemorrhoids; IBS; Appendectomy; osteoporosis; Raynauds; Non hodgkins lymphoma   BOWEL MOVEMENT: Pain with bowel movement: Yes; after bowel movement Type of bowel movement:Type (Bristol Stool Scale) Type 5 or 6, Frequency when she takes the Linzess, and Strain Yes Fully empty rectum: No, bloating Leakage: yes, after a bowel movement a brown liquid comes out Pads: 1 pad per day Fiber supplement: Yes:  Linzess   URINATION: Pain with urination: No, pain right now du eto UTI Fully empty bladder: No, hard to initiate the urine stream  Stream:  weak to average Urgency: No Frequency: daytime: every hour; nighttime: 1 time Leakage:  none   INTERCOURSE: Pain with intercourse:  very painful and husband is diabetic Ability to have vaginal penetration:  No   PREGNANCY: Vaginal deliveries 2 Tearing No C-section deliveries 0 Currently pregnant No   PROLAPSE: None     OBJECTIVE:    DIAGNOSTIC FINDINGS:  none   PATIENT SURVEYS:  none   COGNITION: Overall cognitive status: Within functional limits for tasks assessed                  POSTURE: rounded shoulders, forward head, and increased thoracic kyphosis   PELVIC ALIGNMENT:   LUMBARAROM/PROM: Lumbar ROM decreased by 25%     LOWER EXTREMITY ROM: bilateral hip ROM is full    (Blank rows = not tested)   LOWER EXTREMITY MMT:   MMT Right eval Left eval  Hip extension 4/5 4/5  Hip abduction 4/5 4/5    PALPATION:   General  contracts the upper abdominals and not the lower. Tenderness though out the abdomen.                  External Perineal Exam tenderness located on the ischiocavernosus, urogenital diaphragm, levator ani                             Internal Pelvic Floor not assessed today   Patient does not want pelvic floor assessment internally.    PELVIC MMT:   MMT eval  Vaginal    Internal Anal Sphincter    External Anal Sphincter    Puborectalis    Diastasis Recti    (Blank rows = not tested)         TONE: Not assessed today     TODAY'S TREATMENT:  01/28/2022 Manual: Manual work to the abdomen and diaphragm to release the fascial structures.  Neuromuscular re-education: Pelvic floor contraction training:All with surface EMG Using pelvic floor EMG with surface electrodes on the anus Sidley contract the anus with tactile cues to isolate and not contract the gluteals Quick contraction at 10 UV  with ball squeeze Hold 5 seconds with ball squeeze at 7 uv sidely Sidely press hand into yoga block and ball squeeze and contract pelvic floor 5 sec Sitting ball squeeze with pelvic floor contraction  Exercises: Strengthening: Nustep for 6 minutes level 4 while assessing patient    12/29/2021 Manual: Soft tissue mobilization: To bilateral diaphragm Along the abdomen to release the tissue with circular massage Myofascial release: Tissue rolling of the abdomen Fascial release of the mesenteric root Fascial release along the sac of Douglas Release of the upper abdomen Neuromuscular re-education: Down training: Diaphragmatic breathing with tactile cues, verbal cues to expand the lower rib cage then the abdomen Exercises: Strengthening: Nustep level 2 for 5 minutes while assessing patient.  PATIENT EDUCATION:  01/28/2022 Education details: Access Code: [email or web address]; abdominal massage, sitting with legs uncrossed Person educated: Patient Education method: Explanation, Demonstration, Tactile cues, Verbal cues, and Handouts Education comprehension: verbalized understanding, returned demonstration, verbal cues required, tactile cues required, and needs further education   HOME EXERCISE PROGRAM: 01/28/2022  Access Code: [email or web address] URL: [email or web address] Date: 01/28/2022 Prepared by: Earlie Counts  Program Notes try to sit with legs not crossed   Exercises - Seated Pelvic Floor Contraction with Isometric Hip Adduction  - 3 x daily - 7 x weekly - 1 sets - 10 reps - 5 sec hold ASSESSMENT:   CLINICAL IMPRESSION: Patient is a 72 y.o. female who was seen today for physical therapy treatment for abdominal pain and abnormal defecation. Patient reports her abdominal pain has decreased by 25%. Patient is able to contract the rectum better with ball  squeeze. Patient has some restrictions in the right lower abdomen. Patient will benefit from skilled therapy to reduce abdominal pain, improve pelvic floor coordination and increase strength.    OBJECTIVE IMPAIRMENTS: decreased coordination, decreased strength, increased fascial restrictions, and pain.    ACTIVITY LIMITATIONS: continence and toileting   PARTICIPATION LIMITATIONS: shopping and community activity   PERSONAL FACTORS: Age, Fitness, and 3+ comorbidities: Interstitial cystitis; chronic kidney disease; Hemorrhoids; IBS; Appendectomy; osteoporosis; Raynauds; Non hodgkins lymphoma  are also affecting patient's functional outcome.    REHAB POTENTIAL: Excellent   CLINICAL DECISION MAKING: Evolving/moderate complexity   EVALUATION COMPLEXITY: Moderate     GOALS: Goals reviewed with patient? Yes   SHORT TERM GOALS: Target date: 12/22/2021   Patient educated on abdominal massage to reduce trigger points and improve peristalic motion.  Baseline: Goal status: Met 12/29/2021   2.  Patient able to perform diaphragmatic breathing to reduce tension in the pelvic floor.  Baseline:  Goal status: INITIAL   3.  Patient able to contract the upper and lower abdominals together.  Baseline:  Goal status: INITIAL   4.  Patient reports her abdominal pain has decreased >/= 25%.  Baseline:  Goal status: Met 01/28/2022     LONG TERM GOALS: Target date: 02/16/2022    Patient independent with advanced HEP for core and pelvic floor to reduce fecal leakage and abdominal pain.  Baseline:  Goal status: INITIAL   2.  Patient reports her abdominal pain decreased </= 1/10 due to correct contraction and reduction of trigger poinrs.  Baseline:  Goal status: INITIAL   3.  Pelvic floor strength is increased due to stool leakage after a bowel movement decreased >/= 50%.  Baseline:  Goal status: INITIAL   4.  Patient educated on correct toileting to assist her to feel she has fully emptied her  rectum.  Baseline:  Goal status: INITIAL   5.  Patient reports her abdominal pain decreased </= 1/10 to improve her overall quality of life.  Baseline:  Goal status: INITIAL     PLAN:   PT FREQUENCY: 1x/week   PT DURATION: 12 weeks   PLANNED INTERVENTIONS: Therapeutic exercises, Therapeutic activity, Neuromuscular re-education, Patient/Family education, Self Care, Aquatic Therapy, Dry Needling, Electrical stimulation, Cryotherapy, Moist heat, Taping, Biofeedback, and Manual therapy   PLAN FOR NEXT SESSION: manual work to abdomen, diaphragmatic breathing, correct abdominal contraction with pelvic floor EMG   Earlie Counts, PT 01/28/22 2:01 PM

## 2022-02-04 ENCOUNTER — Encounter: Payer: Self-pay | Admitting: Physical Therapy

## 2022-02-04 ENCOUNTER — Ambulatory Visit: Payer: PPO | Admitting: Physical Therapy

## 2022-02-04 DIAGNOSIS — M6281 Muscle weakness (generalized): Secondary | ICD-10-CM

## 2022-02-04 DIAGNOSIS — R278 Other lack of coordination: Secondary | ICD-10-CM

## 2022-02-04 DIAGNOSIS — R109 Unspecified abdominal pain: Secondary | ICD-10-CM

## 2022-02-04 NOTE — Therapy (Signed)
OUTPATIENT PHYSICAL THERAPY TREATMENT NOTE   Patient Name: Shannon Barajas MRN: [id number] DOB:October 15, 1950, 72 y.o., female Today's Date: 02/04/2022  PCP: Velna Hatchet, MD  REFERRING PROVIDER: Velna Hatchet, MD   END OF SESSION:   PT End of Session - 02/04/22 1355     Visit Number 4    Date for PT Re-Evaluation 02/16/22    Authorization Type [employer]    Authorization - Visit Number 4    Authorization - Number of Visits 10    PT Start Time 1400    PT Stop Time 1440    PT Time Calculation (min) 40 min    Activity Tolerance Patient tolerated treatment well;No increased pain    Behavior During Therapy WFL for tasks assessed/performed             Past Medical History:  Diagnosis Date   Allergy    SEASONAL   Anemia    Arthritis    Cataract    BILATERAL   Chronic interstitial cystitis    Chronic kidney disease (CKD), stage III (moderate) ([place]) 05/25/2014   nephrology dr Clover Mealy q year   GERD (gastroesophageal reflux disease)    H/O: iron deficiency anemia    Hemorrhoids 07/26/2013   Hiatal hernia    small    Insomnia, unspecified    Interstitial cystitis    Irritable bowel syndrome    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[place]) 11/14/2013   Non Hodgkin's lymphoma ([place]) 04/19/2013   Osteoporosis    PONV (postoperative nausea and vomiting)    likes phenergan   Raynaud's syndrome    Splenomegaly    Stricture and stenosis of esophagus    Thrombocytopenia, unspecified ([place])    Unspecified constipation    Past Surgical History:  Procedure Laterality Date   APPENDECTOMY  2012   APPENDECTOMY  2012   BALLOON DILATION N/A 09/06/2014   Procedure: Larrie Kass DILATION;  Surgeon: [name], MD;  Location: [facility];  Service: Endoscopy;  Laterality: N/A;   BREAST LUMPECTOMY Left    DILATION AND CURETTAGE OF UTERUS  yrs ago   ESOPHAGEAL DILATION     ESOPHAGOGASTRODUODENOSCOPY (EGD) WITH PROPOFOL N/A 09/06/2014   Procedure: ESOPHAGOGASTRODUODENOSCOPY (EGD) WITH  PROPOFOL;  Surgeon: [name], MD;  Location: [facility];  Service: Endoscopy;  Laterality: N/A;   HYSTEROSCOPY WITH D & C N/A 12/08/2018   Procedure: DILATATION AND CURETTAGE /HYSTEROSCOPY;  Surgeon: Dian Queen, MD;  Location: [facility];  Service: Gynecology;  Laterality: N/A;   TONSILLECTOMY     TUBAL LIGATION     VIDEO BRONCHOSCOPY Bilateral 08/23/2015   Procedure: VIDEO BRONCHOSCOPY WITH FLUORO;  Surgeon: Javier Glazier, MD;  Location: [facility];  Service: Cardiopulmonary;  Laterality: Bilateral;   Patient Active Problem List   Diagnosis Date Noted   Abdominal bloating 12/05/2019   Preventive measure 10/02/2019   Weight loss 10/02/2019   Other fatigue 10/02/2019   Uterine mass 11/18/2018   Dysuria 11/15/2018   Generalized abdominal pain 11/15/2018   Elevated liver enzymes 06/04/2017   Multiple lung nodules on CT 08/15/2015   Quality of life palliative care encounter 06/07/2015   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[place]) 11/15/2014   Left breast lump 11/15/2014   Dysphagia 08/11/2014   Chronic kidney disease (CKD), stage II (mild) 05/25/2014   Acute dystonic reaction due to drugs 05/25/2014   Bilateral lower extremity edema 05/25/2014   Chronic kidney disease, stage III (moderate) ([place]) 09/05/2013   Hemorrhoids 07/26/2013   Hyponatremia 07/26/2013  Leg edema 06/23/2013   Splenic marginal zone b-cell lymphoma ([place]) 05/30/2013   Thrombocytopenia ([place]) 10/10/2009   Other constipation 10/10/2009   NONSPECIFIC ABN FINDING RAD & OTH EXAM GI TRACT 09/06/2009   RECTAL BLEEDING 07/10/2009   HIP PAIN, LEFT 08/28/2008   SPRAIN&STRAIN OTHER SPECIFIED SITES KNEE&LEG 08/28/2008   OTHER NONTHROMBOCYTOPENIC PURPURAS 07/27/2008   WEIGHT LOSS, ABNORMAL 07/27/2008   DERMATOPHYTOSIS OF NAIL 06/04/2008   GERD 05/24/2008   Rectal bleeding 05/24/2008   HEMORRHOIDS 05/22/2008   ESOPHAGEAL STRICTURE 05/22/2008   HIATAL HERNIA 05/22/2008   PLANTAR WART  08/04/2007   INSOMNIA, SEVERE 08/04/2007   RAYNAUD'S SYNDROME 08/30/2006   IRRITABLE BOWEL SYNDROME 08/30/2006   DIARRHEA, CHRONIC 08/30/2006   REFERRING DIAG:  R10.9 (ICD-10-CM) - Abdominal pain, unspecified abdominal location  R19.8 (ICD-10-CM) - Abnormal defecation      THERAPY DIAG:  Other lack of coordination   Abdominal pain, unspecified abdominal location   Muscle weakness (generalized)   Rationale for Evaluation and Treatment: Rehabilitation   ONSET DATE: 04/2021   SUBJECTIVE:                                                                                                                                                                                            SUBJECTIVE STATEMENT: I feel good. My Linzess medicine is not working as good. I only have diarrhea.  Fluid intake: Yes: decaf coffee, water     PAIN:  Are you having pain? Yes NPRS scale: 0/10 for 02/04/22 Pain location:  lower abdominal, suprapubic   Pain type: nagging pain Pain description: intermittent    Aggravating factors: random pain Relieving factors: randomly   PRECAUTIONS: Other: non- hodgkin's lymphoma ; ostoeporosis   WEIGHT BEARING RESTRICTIONS: No   FALLS:  Has patient fallen in last 6 months? No   LIVING ENVIRONMENT: Lives with: lives with their spouse   OCCUPATION: retired; floor exercises   PLOF: Independent   PATIENT GOALS: reduce pain and bloating   PERTINENT HISTORY:  Interstitial cystitis; chronic kidney disease; Hemorrhoids; IBS; Appendectomy; osteoporosis; Raynauds; Non hodgkins lymphoma   BOWEL MOVEMENT: Pain with bowel movement: Yes; after bowel movement Type of bowel movement:Type ([place] Stool Scale) Type 5 or 6, Frequency when she takes the Linzess, and Strain Yes Fully empty rectum: No, bloating Leakage: yes, after a bowel movement a brown liquid comes out Pads: 1 pad per day Fiber supplement: Yes: Linzess   URINATION: Pain with urination: No, pain right now  due to UTI Fully empty bladder: No, hard to initiate the urine stream Stream:  weak to average Urgency: No Frequency: daytime: every hour; nighttime: 1 time  Leakage:  none   INTERCOURSE: Pain with intercourse:  very painful and husband is diabetic Ability to have vaginal penetration:  No   PREGNANCY: Vaginal deliveries 2 Tearing No C-section deliveries 0 Currently pregnant No   PROLAPSE: None     OBJECTIVE:    DIAGNOSTIC FINDINGS:  none   PATIENT SURVEYS:  none   COGNITION: Overall cognitive status: Within functional limits for tasks assessed                  POSTURE: rounded shoulders, forward head, and increased thoracic kyphosis   PELVIC ALIGNMENT:   LUMBARAROM/PROM: Lumbar ROM decreased by 25%     LOWER EXTREMITY ROM: bilateral hip ROM is full    (Blank rows = not tested)   LOWER EXTREMITY MMT:   MMT Right eval Left eval  Hip extension 4/5 4/5  Hip abduction 4/5 4/5    PALPATION:   General  contracts the upper abdominals and not the lower. Tenderness though out the abdomen.                  External Perineal Exam tenderness located on the ischiocavernosus, urogenital diaphragm, levator ani                             Internal Pelvic Floor not assessed today   Patient does not want pelvic floor assessment internally.    PELVIC MMT:   MMT eval  Vaginal    Internal Anal Sphincter    External Anal Sphincter    Puborectalis    Diastasis Recti    (Blank rows = not tested)         TONE: Not assessed today     TODAY'S TREATMENT:  02/04/22 Manual: Soft tissue mobilization: To bilateral diaphragm Along the abdomen to release the tissue with circular massage Myofascial release: Tissue rolling of the abdomen Fascial release of the mesenteric root Fascial release along the sac of Douglas Release of the upper abdomen Neuromuscular re-education: Core retraining: Sitting with ball between her arms and trunk and breath into it to expand the  rib cage 10 x good Pelvic floor contraction training: Supine and sitting pelvic floor contraction with therapist giving tactile cues to contract correctly and not use the gluteal 01/28/2022 Manual: Manual work to the abdomen and diaphragm to release the fascial structures.  Neuromuscular re-education: Pelvic floor contraction training:All with surface EMG Using pelvic floor EMG with surface electrodes on the anus Sidley contract the anus with tactile cues to isolate and not contract the gluteals Quick contraction at 10 UV with ball squeeze Hold 5 seconds with ball squeeze at 7 uv sidely Sidely press hand into yoga block and ball squeeze and contract pelvic floor 5 sec Sitting ball squeeze with pelvic floor contraction  Exercises: Strengthening: Nustep for 6 minutes level 4 while assessing patient     12/29/2021 Manual: Soft tissue mobilization: To bilateral diaphragm Along the abdomen to release the tissue with circular massage Myofascial release: Tissue rolling of the abdomen Fascial release of the mesenteric root Fascial release along the sac of Douglas Release of the upper abdomen Neuromuscular re-education: Down training: Diaphragmatic breathing with tactile cues, verbal cues to expand the lower rib cage then the abdomen Exercises: Strengthening: Nustep level 2 for 5 minutes while assessing patient.  PATIENT EDUCATION:  02/04/2022 Education details: Access Code: [email or web address]; abdominal massage, sitting with legs uncrossed Person educated: Patient Education method: Explanation, Demonstration, Tactile cues, Verbal cues, and Handouts Education comprehension: verbalized understanding, returned demonstration, verbal cues required, tactile cues required, and needs further education   HOME EXERCISE PROGRAM: 02/04/2022 Access Code: [email or web address] URL:  [email or web address] Date: 02/04/2022 Prepared by: Earlie Counts  Program Notes try to sit with legs not crossed sit with ball between arm and trunk, breath into the ball and feel the rib cage open up 10x each side daily  Exercises - Supine Abdominal Wall Massage  - 1 x daily - 7 x weekly - 1 sets - 10 reps - Supine Transversus Abdominis Bracing - Hands on Thighs  - 1 x daily - 7 x weekly - 1 sets - 10 reps - 5 sec hold - Supine Diaphragmatic Breathing  - 2 x daily - 7 x weekly - 1 sets - 10 reps - Seated Pelvic Floor Contraction with Isometric Hip Adduction  - 3 x daily - 7 x weekly - 1 sets - 10 reps - 5 sec hold - Seated Pelvic Floor Contraction  - 3 x daily - 7 x weekly - 1 sets - 10 reps - 3 sec hold   ASSESSMENT:   CLINICAL IMPRESSION: Patient is a 72 y.o. female who was seen today for physical therapy treatment for abdominal pain and abnormal defecation. Patient continues to have stool leakage.  Patient will contract upper abdominals with diaphragmatic breathing. She is able to contract the lower abdominals with the upper with less tactile cues. The manual work releases restrictions in the abdomen to reduce her pain. Patient will benefit from skilled therapy to reduce abdominal pain, improve pelvic floor coordination and increase strength.    OBJECTIVE IMPAIRMENTS: decreased coordination, decreased strength, increased fascial restrictions, and pain.    ACTIVITY LIMITATIONS: continence and toileting   PARTICIPATION LIMITATIONS: shopping and community activity   PERSONAL FACTORS: Age, Fitness, and 3+ comorbidities: Interstitial cystitis; chronic kidney disease; Hemorrhoids; IBS; Appendectomy; osteoporosis; Raynauds; Non hodgkins lymphoma  are also affecting patient's functional outcome.    REHAB POTENTIAL: Excellent   CLINICAL DECISION MAKING: Evolving/moderate complexity   EVALUATION COMPLEXITY: Moderate     GOALS: Goals reviewed with patient? Yes   SHORT TERM  GOALS: Target date: 12/22/2021   Patient educated on abdominal massage to reduce trigger points and improve peristalic motion.  Baseline: Goal status: Met 12/29/2021   2.  Patient able to perform diaphragmatic breathing to reduce tension in the pelvic floor.  Baseline:  Goal status: Met 02/04/22   3.  Patient able to contract the upper and lower abdominals together.  Baseline:  Goal status: INITIAL   4.  Patient reports her abdominal pain has decreased >/= 25%.  Baseline:  Goal status: Met 01/28/2022     LONG TERM GOALS: Target date: 02/16/2022    Patient independent with advanced HEP for core and pelvic floor to reduce fecal leakage and abdominal pain.  Baseline:  Goal status: ongoing 02/04/22   2.  Patient reports her abdominal pain decreased </= 1/10 due to correct contraction and reduction of trigger poinrs.  Baseline:  Goal status: ongoing 02/04/22   3.  Pelvic floor strength is increased due to stool leakage after a bowel movement decreased >/= 50%.  Baseline:  Goal status: ongoing 02/04/22   4.  Patient educated on correct toileting to assist her to feel she has fully emptied her rectum.  Baseline:  Goal status: ongoing  02/04/22   5.  Patient reports her abdominal pain decreased </= 1/10 to improve her overall quality of life.  Baseline:  Goal status: ongoing 02/04/22     PLAN:   PT FREQUENCY: 1x/week   PT DURATION: 12 weeks   PLANNED INTERVENTIONS: Therapeutic exercises, Therapeutic activity, Neuromuscular re-education, Patient/Family education, Self Care, Aquatic Therapy, Dry Needling, Electrical stimulation, Cryotherapy, Moist heat, Taping, Biofeedback, and Manual therapy   PLAN FOR NEXT SESSION: manual work to abdomen, diaphragmatic breathing without contracting the upper abdominals, correct abdominal    Earlie Counts, PT 02/04/22 2:45 PM

## 2022-02-11 ENCOUNTER — Ambulatory Visit: Payer: PPO | Admitting: Physical Therapy

## 2022-02-18 ENCOUNTER — Encounter: Payer: PPO | Admitting: Physical Therapy

## 2022-03-12 DIAGNOSIS — M25562 Pain in left knee: Secondary | ICD-10-CM | POA: Diagnosis not present

## 2022-03-12 DIAGNOSIS — M1712 Unilateral primary osteoarthritis, left knee: Secondary | ICD-10-CM | POA: Diagnosis not present

## 2022-03-18 ENCOUNTER — Encounter: Payer: PPO | Admitting: Physical Therapy

## 2022-03-25 ENCOUNTER — Encounter: Payer: Self-pay | Admitting: Physical Therapy

## 2022-03-25 ENCOUNTER — Ambulatory Visit: Payer: PPO | Attending: Internal Medicine | Admitting: Physical Therapy

## 2022-03-25 DIAGNOSIS — M6281 Muscle weakness (generalized): Secondary | ICD-10-CM | POA: Diagnosis not present

## 2022-03-25 DIAGNOSIS — R278 Other lack of coordination: Secondary | ICD-10-CM | POA: Insufficient documentation

## 2022-03-25 DIAGNOSIS — R109 Unspecified abdominal pain: Secondary | ICD-10-CM | POA: Diagnosis not present

## 2022-03-25 NOTE — Therapy (Signed)
OUTPATIENT PHYSICAL THERAPY TREATMENT NOTE   Patient Name: Shannon Barajas MRN: [id number] DOB:1950-08-28, 72 y.o., female Today's Date: 03/25/2022  PCP: Velna Hatchet, MD  REFERRING PROVIDER: Silvano Rusk, MD  END OF SESSION:   PT End of Session - 03/25/22 1401     Visit Number 5    Date for PT Re-Evaluation 05/11/22    Authorization Type [employer]    Authorization - Visit Number 5    Authorization - Number of Visits 10    PT Start Time 1400    PT Stop Time 1440    PT Time Calculation (min) 40 min    Activity Tolerance Patient tolerated treatment well;No increased pain    Behavior During Therapy WFL for tasks assessed/performed             Past Medical History:  Diagnosis Date   Allergy    SEASONAL   Anemia    Arthritis    Cataract    BILATERAL   Chronic interstitial cystitis    Chronic kidney disease (CKD), stage III (moderate) ([place]) 05/25/2014   nephrology dr Clover Mealy q year   GERD (gastroesophageal reflux disease)    H/O: iron deficiency anemia    Hemorrhoids 07/26/2013   Hiatal hernia    small    Insomnia, unspecified    Interstitial cystitis    Irritable bowel syndrome    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[place]) 11/14/2013   Non Hodgkin's lymphoma ([place]) 04/19/2013   Osteoporosis    PONV (postoperative nausea and vomiting)    likes phenergan   Raynaud's syndrome    Splenomegaly    Stricture and stenosis of esophagus    Thrombocytopenia, unspecified ([place])    Unspecified constipation    Past Surgical History:  Procedure Laterality Date   APPENDECTOMY  2012   APPENDECTOMY  2012   BALLOON DILATION N/A 09/06/2014   Procedure: Larrie Kass DILATION;  Surgeon: [name], MD;  Location: [facility];  Service: Endoscopy;  Laterality: N/A;   BREAST LUMPECTOMY Left    DILATION AND CURETTAGE OF UTERUS  yrs ago   ESOPHAGEAL DILATION     ESOPHAGOGASTRODUODENOSCOPY (EGD) WITH PROPOFOL N/A 09/06/2014   Procedure: ESOPHAGOGASTRODUODENOSCOPY (EGD) WITH  PROPOFOL;  Surgeon: [name], MD;  Location: [facility];  Service: Endoscopy;  Laterality: N/A;   HYSTEROSCOPY WITH D & C N/A 12/08/2018   Procedure: DILATATION AND CURETTAGE /HYSTEROSCOPY;  Surgeon: Dian Queen, MD;  Location: [facility];  Service: Gynecology;  Laterality: N/A;   TONSILLECTOMY     TUBAL LIGATION     VIDEO BRONCHOSCOPY Bilateral 08/23/2015   Procedure: VIDEO BRONCHOSCOPY WITH FLUORO;  Surgeon: Javier Glazier, MD;  Location: [facility];  Service: Cardiopulmonary;  Laterality: Bilateral;   Patient Active Problem List   Diagnosis Date Noted   Abdominal bloating 12/05/2019   Preventive measure 10/02/2019   Weight loss 10/02/2019   Other fatigue 10/02/2019   Uterine mass 11/18/2018   Dysuria 11/15/2018   Generalized abdominal pain 11/15/2018   Elevated liver enzymes 06/04/2017   Multiple lung nodules on CT 08/15/2015   Quality of life palliative care encounter 06/07/2015   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[place]) 11/15/2014   Left breast lump 11/15/2014   Dysphagia 08/11/2014   Chronic kidney disease (CKD), stage II (mild) 05/25/2014   Acute dystonic reaction due to drugs 05/25/2014   Bilateral lower extremity edema 05/25/2014   Chronic kidney disease, stage III (moderate) ([place]) 09/05/2013   Hemorrhoids 07/26/2013   Hyponatremia 07/26/2013  Leg edema 06/23/2013   Splenic marginal zone b-cell lymphoma ([place]) 05/30/2013   Thrombocytopenia ([place]) 10/10/2009   Other constipation 10/10/2009   NONSPECIFIC ABN FINDING RAD & OTH EXAM GI TRACT 09/06/2009   RECTAL BLEEDING 07/10/2009   HIP PAIN, LEFT 08/28/2008   SPRAIN&STRAIN OTHER SPECIFIED SITES KNEE&LEG 08/28/2008   OTHER NONTHROMBOCYTOPENIC PURPURAS 07/27/2008   WEIGHT LOSS, ABNORMAL 07/27/2008   DERMATOPHYTOSIS OF NAIL 06/04/2008   GERD 05/24/2008   Rectal bleeding 05/24/2008   HEMORRHOIDS 05/22/2008   ESOPHAGEAL STRICTURE 05/22/2008   HIATAL HERNIA 05/22/2008   PLANTAR WART  08/04/2007   INSOMNIA, SEVERE 08/04/2007   RAYNAUD'S SYNDROME 08/30/2006   IRRITABLE BOWEL SYNDROME 08/30/2006   DIARRHEA, CHRONIC 08/30/2006   REFERRING DIAG:  R10.9 (ICD-10-CM) - Abdominal pain, unspecified abdominal location  R19.8 (ICD-10-CM) - Abnormal defecation      THERAPY DIAG:  Other lack of coordination   Abdominal pain, unspecified abdominal location   Muscle weakness (generalized)   Rationale for Evaluation and Treatment: Rehabilitation   ONSET DATE: 04/2021   SUBJECTIVE:                                                                                                                                                                                            SUBJECTIVE STATEMENT: Patient has been with her mother who is not doing well making it difficult to come to therapy.  Fluid intake: Yes: decaf coffee, water     PAIN:  Are you having pain? Yes NPRS scale: 8/10 for 02/04/22 Pain location:  lower abdominal, suprapubic   Pain type: nagging pain Pain description: intermittent    Aggravating factors: random pain Relieving factors: randomly   PRECAUTIONS: Other: non- hodgkin's lymphoma ; ostoeporosis   WEIGHT BEARING RESTRICTIONS: No   FALLS:  Has patient fallen in last 6 months? No   LIVING ENVIRONMENT: Lives with: lives with their spouse   OCCUPATION: retired; floor exercises   PLOF: Independent   PATIENT GOALS: reduce pain and bloating   PERTINENT HISTORY:  Interstitial cystitis; chronic kidney disease; Hemorrhoids; IBS; Appendectomy; osteoporosis; Raynauds; Non hodgkins lymphoma   BOWEL MOVEMENT: Pain with bowel movement: Yes; after bowel movement Type of bowel movement:Type (Bristol Stool Scale) Type 5 or 6, Frequency when she takes the Linzess, and Strain Yes Fully empty rectum: No, bloating Leakage: yes, after a bowel movement a brown liquid comes out Pads: 1 pad per day Fiber supplement: Yes: Linzess   URINATION: Pain with urination:  No, pain right now due to UTI Fully empty bladder: No, hard to initiate the urine stream Stream:  weak to average Urgency: No Frequency: daytime: every hour;  nighttime: 1 time Leakage:  none   INTERCOURSE: Pain with intercourse:  very painful and husband is diabetic Ability to have vaginal penetration:  No   PREGNANCY: Vaginal deliveries 2 Tearing No C-section deliveries 0 Currently pregnant No   PROLAPSE: None     OBJECTIVE:    DIAGNOSTIC FINDINGS:  none   PATIENT SURVEYS:  none   COGNITION: Overall cognitive status: Within functional limits for tasks assessed                  POSTURE: rounded shoulders, forward head, and increased thoracic kyphosis   PELVIC ALIGNMENT:   LUMBARAROM/PROM: Lumbar ROM decreased by 25%     LOWER EXTREMITY ROM: bilateral hip ROM is full    (Blank rows = not tested)   LOWER EXTREMITY MMT:   MMT Right eval Left eval Right/left 03/25/22  Hip extension 4/5 4/5 4/5  Hip abduction 4/5 4/5 4/5    PALPATION:   General  contracts the upper abdominals and not the lower. Tenderness though out the abdomen.                  External Perineal Exam tenderness located on the ischiocavernosus, urogenital diaphragm, levator ani                             Internal Pelvic Floor not assessed today   Patient does not want pelvic floor assessment internally.    PELVIC MMT:   MMT 03/25/22  Internal Anal Sphincter 2/5   External Anal Sphincter 2/5   Puborectalis 2/5   Diastasis Recti    (Blank rows = not tested)         TONE: Not assessed today     TODAY'S TREATMENT:  03/25/22 Manual: Soft tissue mobilization: Manual work  to the abdomen to promote peristalic motion of the intestines.  Internal pelvic floor techniques: No emotional/communication barriers or cognitive limitation. Patient is motivated to learn. Patient understands and agrees with treatment goals and plan. PT explains patient will be examined in standing, sitting, and  lying down to see how their muscles and joints work. When they are ready, they will be asked to remove their underwear so PT can examine their perineum. The patient is also given the option of providing their own chaperone as one is not provided in our facility. The patient also has the right and is explained the right to defer or refuse any part of the evaluation or treatment including the internal exam. With the patient's consent, PT will use one gloved finger to gently assess the muscles of the pelvic floor, seeing how well it contracts and relaxes and if there is muscle symmetry. After, the patient will get dressed and PT and patient will discuss exam findings and plan of care. PT and patient discuss plan of care, schedule, attendance policy and HEP activities.  Going through the rectum with manual work to the puborectalis to improve forward contraction Therapist finger in the rectum giving tactile cues to contract the pelvic floor with trying to pull finger out of the rectum and patient was able to contract for 5 seconds instead of 2.  Neuromuscular re-education: Pelvic floor contraction training: Pelvic floor contraction with therapist finger in the rectum to facilitate a contraction and the forward movement of the puborectalis.   02/04/22 Manual: Soft tissue mobilization: To bilateral diaphragm Along the abdomen to release the tissue with circular massage Myofascial release: Tissue rolling of the  abdomen Fascial release of the mesenteric root Fascial release along the sac of [place] Release of the upper abdomen Neuromuscular re-education: Core retraining: Sitting with ball between her arms and trunk and breath into it to expand the rib cage 10 x good Pelvic floor contraction training: Supine and sitting pelvic floor contraction with therapist giving tactile cues to contract correctly and not use the gluteal 01/28/2022 Manual: Manual work to the abdomen and diaphragm to release the  fascial structures.  Neuromuscular re-education: Pelvic floor contraction training:All with surface EMG Using pelvic floor EMG with surface electrodes on the anus Sidley contract the anus with tactile cues to isolate and not contract the gluteals Quick contraction at 10 UV with ball squeeze Hold 5 seconds with ball squeeze at 7 uv sidely Sidely press hand into yoga block and ball squeeze and contract pelvic floor 5 sec Sitting ball squeeze with pelvic floor contraction  Exercises: Strengthening: Nustep for 6 minutes level 4 while assessing patient      PATIENT EDUCATION:  03/25/2022 Education details: Access Code: [email or web address]; abdominal massage, sitting with legs uncrossed Person educated: Patient Education method: Explanation, Demonstration, Tactile cues, Verbal cues, and Handouts Education comprehension: verbalized understanding, returned demonstration, verbal cues required, tactile cues required, and needs further education   HOME EXERCISE PROGRAM: 03/25/2022 Access Code: [email or web address] URL: [email or web address] Date: 03/25/2022 Prepared by: Earlie Counts  Program Notes try to sit with legs not crossed sit with ball between arm and trunk, breath into the ball and feel the rib cage open up 10x each side daily  Exercises - Supine Abdominal Wall Massage  - 1 x daily - 7 x weekly - 1 sets - 10 reps - Supine Transversus Abdominis Bracing - Hands on Thighs  - 1 x daily - 7 x weekly - 1 sets - 10 reps - 5 sec hold - Supine Diaphragmatic Breathing  - 2 x daily - 7 x weekly - 1 sets - 10 reps - Seated Pelvic Floor Contraction with Isometric Hip Adduction  - 3 x daily - 7 x weekly - 1 sets - 10 reps - 5 sec hold - Seated Pelvic Floor Contraction  - 3 x daily - 7 x weekly - 1 sets - 10 reps - 3 sec hold - Sidelying Pelvic Floor Contraction with Self-Palpation  - 3 x daily - 7 x weekly - 1 sets - 5 reps - 5 sec hold   ASSESSMENT:   CLINICAL IMPRESSION: Patient is a 73 y.o. female who  was seen today for physical therapy treatment for abdominal pain and abnormal defecation. Patient has been with her mother who is not doing well making it difficult to come to therapy. Patient continues to have stool leakage. Patient abdominal pain is averaging 8/10 and she is still seeing what is aggravating her stomach.  Patient continues to have stool leakage. It is hard to be consistent with her exercises due to taking care of her mother. Patient can fully empty her rectum when she takes her medicine every other day.  Patient will contract upper abdominals with diaphragmatic breathing. She is able to contract the lower abdominals with the upper with less tactile cues. The manual work releases restrictions in the abdomen to reduce her pain. Pelvic floor strength is 2/5 holding for 2 sec but if therapist is trying to pull her finger out of the rectum she is able to contract to 3/5 holding for 5 seconds. She needs therapist to tap on the puborectalis to improve the forward contraction. Patient  will benefit from skilled therapy to reduce abdominal pain, improve pelvic floor coordination and increase strength.    OBJECTIVE IMPAIRMENTS: decreased coordination, decreased strength, increased fascial restrictions, and pain.    ACTIVITY LIMITATIONS: continence and toileting   PARTICIPATION LIMITATIONS: shopping and community activity   PERSONAL FACTORS: Age, Fitness, and 3+ comorbidities: Interstitial cystitis; chronic kidney disease; Hemorrhoids; IBS; Appendectomy; osteoporosis; Raynauds; Non hodgkins lymphoma  are also affecting patient's functional outcome.    REHAB POTENTIAL: Excellent   CLINICAL DECISION MAKING: Evolving/moderate complexity   EVALUATION COMPLEXITY: Moderate     GOALS: Goals reviewed with patient? Yes   SHORT TERM GOALS: Target date: 12/22/2021   Patient educated on abdominal massage to reduce trigger points and improve peristalic motion.  Baseline: Goal status: Met 12/29/2021    2.  Patient able to perform diaphragmatic breathing to reduce tension in the pelvic floor.  Baseline:  Goal status: Met 02/04/22   3.  Patient able to contract the upper and lower abdominals together.  Baseline:  Goal status: ongoing 03/25/22   4.  Patient reports her abdominal pain has decreased >/= 25%.  Baseline:  Goal status: Met 01/28/2022     LONG TERM GOALS: Target date: 02/16/2022    Patient independent with advanced HEP for core and pelvic floor to reduce fecal leakage and abdominal pain.  Baseline:  Goal status: ongoing 03/25/22   2.  Patient reports her abdominal pain decreased </= 1/10 due to correct contraction and reduction of trigger poinrs.  Baseline:  Goal status: ongoing 03/25/22   3.  Pelvic floor strength is increased due to stool leakage after a bowel movement decreased >/= 50%.  Baseline:  Goal status: ongoing 03/25/22   4.  Patient educated on correct toileting to assist her to feel she has fully emptied her rectum.  Baseline:  Goal status: ongoing 03/25/22   5.  Patient reports her abdominal pain decreased </= 1/10 to improve her overall quality of life.  Baseline:  Goal status: ongoing 03/25/22     PLAN:   PT FREQUENCY: 1x/week   PT DURATION: 12 weeks   PLANNED INTERVENTIONS: Therapeutic exercises, Therapeutic activity, Neuromuscular re-education, Patient/Family education, Self Care, Aquatic Therapy, Dry Needling, Electrical stimulation, Cryotherapy, Moist heat, Taping, Biofeedback, and Manual therapy   PLAN FOR NEXT SESSION: manual work to abdomen, diaphragmatic breathing without contracting the upper abdominals, pelvic floor contraction   Earlie Counts, PT 03/25/22 2:05 PM

## 2022-04-01 ENCOUNTER — Ambulatory Visit: Payer: PPO | Admitting: Physical Therapy

## 2022-04-01 ENCOUNTER — Encounter: Payer: Self-pay | Admitting: Physical Therapy

## 2022-04-01 DIAGNOSIS — M6281 Muscle weakness (generalized): Secondary | ICD-10-CM

## 2022-04-01 DIAGNOSIS — R278 Other lack of coordination: Secondary | ICD-10-CM | POA: Diagnosis not present

## 2022-04-01 DIAGNOSIS — R109 Unspecified abdominal pain: Secondary | ICD-10-CM

## 2022-04-01 NOTE — Therapy (Signed)
OUTPATIENT PHYSICAL THERAPY TREATMENT NOTE   Patient Name: Shannon Barajas MRN: [id number] DOB:17-Jul-1950, 72 y.o., female Today's Date: 04/01/2022  PCP: Velna Hatchet, MD   REFERRING PROVIDER: Silvano Rusk, MD   END OF SESSION:   PT End of Session - 04/01/22 1358     Visit Number 6    Date for PT Re-Evaluation 05/11/22    Authorization Type [employer]    Authorization - Visit Number 6    Authorization - Number of Visits 10    PT Start Time 1400    PT Stop Time 1440    PT Time Calculation (min) 40 min    Activity Tolerance Patient tolerated treatment well;No increased pain    Behavior During Therapy WFL for tasks assessed/performed             Past Medical History:  Diagnosis Date   Allergy    SEASONAL   Anemia    Arthritis    Cataract    BILATERAL   Chronic interstitial cystitis    Chronic kidney disease (CKD), stage III (moderate) ([place]) 05/25/2014   nephrology dr Clover Mealy q year   GERD (gastroesophageal reflux disease)    H/O: iron deficiency anemia    Hemorrhoids 07/26/2013   Hiatal hernia    small    Insomnia, unspecified    Interstitial cystitis    Irritable bowel syndrome    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[place]) 11/14/2013   Non Hodgkin's lymphoma ([place]) 04/19/2013   Osteoporosis    PONV (postoperative nausea and vomiting)    likes phenergan   Raynaud's syndrome    Splenomegaly    Stricture and stenosis of esophagus    Thrombocytopenia, unspecified ([place])    Unspecified constipation    Past Surgical History:  Procedure Laterality Date   APPENDECTOMY  2012   APPENDECTOMY  2012   BALLOON DILATION N/A 09/06/2014   Procedure: Larrie Kass DILATION;  Surgeon: [name], MD;  Location: [facility];  Service: Endoscopy;  Laterality: N/A;   BREAST LUMPECTOMY Left    DILATION AND CURETTAGE OF UTERUS  yrs ago   ESOPHAGEAL DILATION     ESOPHAGOGASTRODUODENOSCOPY (EGD) WITH PROPOFOL N/A 09/06/2014   Procedure: ESOPHAGOGASTRODUODENOSCOPY (EGD) WITH  PROPOFOL;  Surgeon: [name], MD;  Location: [facility];  Service: Endoscopy;  Laterality: N/A;   HYSTEROSCOPY WITH D & C N/A 12/08/2018   Procedure: DILATATION AND CURETTAGE /HYSTEROSCOPY;  Surgeon: Dian Queen, MD;  Location: [facility];  Service: Gynecology;  Laterality: N/A;   TONSILLECTOMY     TUBAL LIGATION     VIDEO BRONCHOSCOPY Bilateral 08/23/2015   Procedure: VIDEO BRONCHOSCOPY WITH FLUORO;  Surgeon: Javier Glazier, MD;  Location: [facility];  Service: Cardiopulmonary;  Laterality: Bilateral;   Patient Active Problem List   Diagnosis Date Noted   Abdominal bloating 12/05/2019   Preventive measure 10/02/2019   Weight loss 10/02/2019   Other fatigue 10/02/2019   Uterine mass 11/18/2018   Dysuria 11/15/2018   Generalized abdominal pain 11/15/2018   Elevated liver enzymes 06/04/2017   Multiple lung nodules on CT 08/15/2015   Quality of life palliative care encounter 06/07/2015   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[place]) 11/15/2014   Left breast lump 11/15/2014   Dysphagia 08/11/2014   Chronic kidney disease (CKD), stage II (mild) 05/25/2014   Acute dystonic reaction due to drugs 05/25/2014   Bilateral lower extremity edema 05/25/2014   Chronic kidney disease, stage III (moderate) ([place]) 09/05/2013   Hemorrhoids 07/26/2013   Hyponatremia  07/26/2013   Leg edema 06/23/2013   Splenic marginal zone b-cell lymphoma ([place]) 05/30/2013   Thrombocytopenia ([place]) 10/10/2009   Other constipation 10/10/2009   NONSPECIFIC ABN FINDING RAD & OTH EXAM GI TRACT 09/06/2009   RECTAL BLEEDING 07/10/2009   HIP PAIN, LEFT 08/28/2008   SPRAIN&STRAIN OTHER SPECIFIED SITES KNEE&LEG 08/28/2008   OTHER NONTHROMBOCYTOPENIC PURPURAS 07/27/2008   WEIGHT LOSS, ABNORMAL 07/27/2008   DERMATOPHYTOSIS OF NAIL 06/04/2008   GERD 05/24/2008   Rectal bleeding 05/24/2008   HEMORRHOIDS 05/22/2008   ESOPHAGEAL STRICTURE 05/22/2008   HIATAL HERNIA 05/22/2008   PLANTAR WART  08/04/2007   INSOMNIA, SEVERE 08/04/2007   RAYNAUD'S SYNDROME 08/30/2006   IRRITABLE BOWEL SYNDROME 08/30/2006   DIARRHEA, CHRONIC 08/30/2006   REFERRING DIAG:  R10.9 (ICD-10-CM) - Abdominal pain, unspecified abdominal location  R19.8 (ICD-10-CM) - Abnormal defecation      THERAPY DIAG:  Other lack of coordination   Abdominal pain, unspecified abdominal location   Muscle weakness (generalized)   Rationale for Evaluation and Treatment: Rehabilitation   ONSET DATE: 04/2021   SUBJECTIVE:                                                                                                                                                                                            SUBJECTIVE STATEMENT:   Fluid intake: Yes: decaf coffee, water     PAIN:  Are you having pain? Yes NPRS scale: 8/10 for 02/04/22 Pain location:  lower abdominal, suprapubic   Pain type: nagging pain Pain description: intermittent    Aggravating factors: random pain Relieving factors: randomly   PRECAUTIONS: Other: non- hodgkin's lymphoma ; ostoeporosis   WEIGHT BEARING RESTRICTIONS: No   FALLS:  Has patient fallen in last 6 months? No   LIVING ENVIRONMENT: Lives with: lives with their spouse   OCCUPATION: retired; floor exercises   PLOF: Independent   PATIENT GOALS: reduce pain and bloating   PERTINENT HISTORY:  Interstitial cystitis; chronic kidney disease; Hemorrhoids; IBS; Appendectomy; osteoporosis; Raynauds; Non hodgkins lymphoma   BOWEL MOVEMENT: Pain with bowel movement: Yes; after bowel movement Type of bowel movement:Type ([place] Stool Scale) Type 5 or 6, Frequency when she takes the Linzess, and Strain Yes Fully empty rectum: No, bloating Leakage: yes, after a bowel movement a brown liquid comes out Pads: 1 pad per day Fiber supplement: Yes: Linzess   URINATION: Pain with urination: No, pain right now due to UTI Fully empty bladder: No, hard to initiate the urine  stream Stream:  weak to average Urgency: No Frequency: daytime: every hour; nighttime: 1 time Leakage:  none   INTERCOURSE: Pain with intercourse:  very  painful and husband is diabetic Ability to have vaginal penetration:  No   PREGNANCY: Vaginal deliveries 2 Tearing No C-section deliveries 0 Currently pregnant No   PROLAPSE: None     OBJECTIVE:    DIAGNOSTIC FINDINGS:  none   PATIENT SURVEYS:  none   COGNITION: Overall cognitive status: Within functional limits for tasks assessed                  POSTURE: rounded shoulders, forward head, and increased thoracic kyphosis   PELVIC ALIGNMENT:   LUMBARAROM/PROM: Lumbar ROM decreased by 25%     LOWER EXTREMITY ROM: bilateral hip ROM is full    (Blank rows = not tested)   LOWER EXTREMITY MMT:   MMT Right eval Left eval Right/left 03/25/22  Hip extension 4/5 4/5 4/5  Hip abduction 4/5 4/5 4/5    PALPATION:   General  contracts the upper abdominals and not the lower. Tenderness though out the abdomen.                  External Perineal Exam tenderness located on the ischiocavernosus, urogenital diaphragm, levator ani                             Internal Pelvic Floor not assessed today   Patient does not want pelvic floor assessment internally.    PELVIC MMT:   MMT 03/25/22  Internal Anal Sphincter 2/5   External Anal Sphincter 2/5   Puborectalis 2/5   Diastasis Recti    (Blank rows = not tested)         TONE: Not assessed today     TODAY'S TREATMENT:  04/01/22 Manual: Soft tissue mobilization: Manual work  to the abdomen to promote peristalic motion of the intestines Manual work to diaphragm Scar tissue mobilization: Myofascial release: Fascial release of the lower abdomen and midline Tissue rolling of the upper abdomen Neuromuscular re-education: Down training: Diaphragmatic breathing in supine with tactile and verbal cues, placed weight on the abdomen to assist with filling up the  abdomen Exercises: Strengthening: Nustep for 6 minutes while assessing patient Bridge with ball squeeze 10x with verbal cues to raise hips up Supine with ball between knees and march  03/25/22 Manual: Soft tissue mobilization: Manual work  to the abdomen to promote peristalic motion of the intestines.  Internal pelvic floor techniques: No emotional/communication barriers or cognitive limitation. Patient is motivated to learn. Patient understands and agrees with treatment goals and plan. PT explains patient will be examined in standing, sitting, and lying down to see how their muscles and joints work. When they are ready, they will be asked to remove their underwear so PT can examine their perineum. The patient is also given the option of providing their own chaperone as one is not provided in our facility. The patient also has the right and is explained the right to defer or refuse any part of the evaluation or treatment including the internal exam. With the patient's consent, PT will use one gloved finger to gently assess the muscles of the pelvic floor, seeing how well it contracts and relaxes and if there is muscle symmetry. After, the patient will get dressed and PT and patient will discuss exam findings and plan of care. PT and patient discuss plan of care, schedule, attendance policy and HEP activities.  Going through the rectum with manual work to the puborectalis to improve forward contraction Therapist finger in the rectum giving  tactile cues to contract the pelvic floor with trying to pull finger out of the rectum and patient was able to contract for 5 seconds instead of 2.  Neuromuscular re-education: Pelvic floor contraction training: Pelvic floor contraction with therapist finger in the rectum to facilitate a contraction and the forward movement of the puborectalis.    02/04/22 Manual: Soft tissue mobilization: To bilateral diaphragm Along the abdomen to release the tissue with  circular massage Myofascial release: Tissue rolling of the abdomen Fascial release of the mesenteric root Fascial release along the sac of [place] Release of the upper abdomen Neuromuscular re-education: Core retraining: Sitting with ball between her arms and trunk and breath into it to expand the rib cage 10 x good Pelvic floor contraction training: Supine and sitting pelvic floor contraction with therapist giving tactile cues to contract correctly and not use the gluteal    PATIENT EDUCATION:  03/25/2022 Education details: Access Code: [email or web address]; abdominal massage, sitting with legs uncrossed Person educated: Patient Education method: Explanation, Demonstration, Tactile cues, Verbal cues, and Handouts Education comprehension: verbalized understanding, returned demonstration, verbal cues required, tactile cues required, and needs further education   HOME EXERCISE PROGRAM: 03/25/2022 Access Code: [email or web address] URL: [email or web address] Date: 03/25/2022 Prepared by: Earlie Counts   Program Notes try to sit with legs not crossed sit with ball between arm and trunk, breath into the ball and feel the rib cage open up 10x each side daily   Exercises - Supine Abdominal Wall Massage  - 1 x daily - 7 x weekly - 1 sets - 10 reps - Supine Transversus Abdominis Bracing - Hands on Thighs  - 1 x daily - 7 x weekly - 1 sets - 10 reps - 5 sec hold - Supine Diaphragmatic Breathing  - 2 x daily - 7 x weekly - 1 sets - 10 reps - Seated Pelvic Floor Contraction with Isometric Hip Adduction  - 3 x daily - 7 x weekly - 1 sets - 10 reps - 5 sec hold - Seated Pelvic Floor Contraction  - 3 x daily - 7 x weekly - 1 sets - 10 reps - 3 sec hold - Sidelying Pelvic Floor Contraction with Self-Palpation  - 3 x daily - 7 x weekly - 1 sets - 5 reps - 5 sec hold   ASSESSMENT:   CLINICAL IMPRESSION: Patient is a 72 y.o. female who was seen today for physical therapy treatment for abdominal pain and abnormal  defecation. Patient felt her abdomen has reduced the swelling after the manual work. She had increased in intestinal sounds after the manual work. Patient needs to take the Linzess to have a bowel movement. Stool leakage 20% better. She is able to contract the upper and lower abdomen equally. Patient still has difficulty with diaphragmatic breathing. Patient will benefit from skilled therapy to reduce abdominal pain, improve pelvic floor coordination and increase strength.    OBJECTIVE IMPAIRMENTS: decreased coordination, decreased strength, increased fascial restrictions, and pain.    ACTIVITY LIMITATIONS: continence and toileting   PARTICIPATION LIMITATIONS: shopping and community activity   PERSONAL FACTORS: Age, Fitness, and 3+ comorbidities: Interstitial cystitis; chronic kidney disease; Hemorrhoids; IBS; Appendectomy; osteoporosis; Raynauds; Non hodgkins lymphoma  are also affecting patient's functional outcome.    REHAB POTENTIAL: Excellent   CLINICAL DECISION MAKING: Evolving/moderate complexity   EVALUATION COMPLEXITY: Moderate     GOALS: Goals reviewed with patient? Yes   SHORT TERM GOALS: Target date: 12/22/2021   Patient educated on abdominal massage to reduce trigger points  and improve peristalic motion.  Baseline: Goal status: Met 12/29/2021   2.  Patient able to perform diaphragmatic breathing to reduce tension in the pelvic floor.  Baseline:  Goal status: Met 02/04/22   3.  Patient able to contract the upper and lower abdominals together.  Baseline:  Goal status: Met 04/01/22   4.  Patient reports her abdominal pain has decreased >/= 25%.  Baseline:  Goal status: Met 01/28/2022     LONG TERM GOALS: Target date: 02/16/2022    Patient independent with advanced HEP for core and pelvic floor to reduce fecal leakage and abdominal pain.  Baseline:  Goal status: ongoing 03/25/22   2.  Patient reports her abdominal pain decreased </= 1/10 due to correct contraction and  reduction of trigger poinrs.  Baseline:  Goal status: ongoing 03/25/22   3.  Pelvic floor strength is increased due to stool leakage after a bowel movement decreased >/= 50%.  Baseline:  Goal status: ongoing 03/25/22   4.  Patient educated on correct toileting to assist her to feel she has fully emptied her rectum.  Baseline:  Goal status: ongoing 03/25/22   5.  Patient reports her abdominal pain decreased </= 1/10 to improve her overall quality of life.  Baseline:  Goal status: ongoing 03/25/22     PLAN:   PT FREQUENCY: 1x/week   PT DURATION: 12 weeks   PLANNED INTERVENTIONS: Therapeutic exercises, Therapeutic activity, Neuromuscular re-education, Patient/Family education, Self Care, Aquatic Therapy, Dry Needling, Electrical stimulation, Cryotherapy, Moist heat, Taping, Biofeedback, and Manual therapy   PLAN FOR NEXT SESSION: manual work to abdomen, check rectal strength, core and pelvic floor contraction   Earlie Counts, PT 04/01/22 2:42 PM

## 2022-04-08 ENCOUNTER — Encounter: Payer: Self-pay | Admitting: Physical Therapy

## 2022-04-08 ENCOUNTER — Ambulatory Visit: Payer: PPO | Admitting: Physical Therapy

## 2022-04-08 DIAGNOSIS — R278 Other lack of coordination: Secondary | ICD-10-CM

## 2022-04-08 DIAGNOSIS — M6281 Muscle weakness (generalized): Secondary | ICD-10-CM

## 2022-04-08 DIAGNOSIS — R109 Unspecified abdominal pain: Secondary | ICD-10-CM

## 2022-04-08 NOTE — Therapy (Signed)
OUTPATIENT PHYSICAL THERAPY TREATMENT NOTE   Patient Name: Shannon Barajas MRN: [id number] DOB:1950-03-01, 72 y.o., female Today's Date: 04/08/2022  PCP:  Velna Hatchet, MD  REFERRING PROVIDER: Silvano Rusk, MD   END OF SESSION:   PT End of Session - 04/08/22 1400     Visit Number 7    Date for PT Re-Evaluation 05/11/22    Authorization Type [employer]    Authorization - Visit Number 7    Authorization - Number of Visits 10    PT Start Time 1400    PT Stop Time 1445    PT Time Calculation (min) 45 min    Activity Tolerance Patient tolerated treatment well;No increased pain    Behavior During Therapy WFL for tasks assessed/performed             Past Medical History:  Diagnosis Date   Allergy    SEASONAL   Anemia    Arthritis    Cataract    BILATERAL   Chronic interstitial cystitis    Chronic kidney disease (CKD), stage III (moderate) ([place]) 05/25/2014   nephrology dr Clover Mealy q year   GERD (gastroesophageal reflux disease)    H/O: iron deficiency anemia    Hemorrhoids 07/26/2013   Hiatal hernia    small    Insomnia, unspecified    Interstitial cystitis    Irritable bowel syndrome    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[place]) 11/14/2013   Non Hodgkin's lymphoma ([place]) 04/19/2013   Osteoporosis    PONV (postoperative nausea and vomiting)    likes phenergan   Raynaud's syndrome    Splenomegaly    Stricture and stenosis of esophagus    Thrombocytopenia, unspecified ([place])    Unspecified constipation    Past Surgical History:  Procedure Laterality Date   APPENDECTOMY  2012   APPENDECTOMY  2012   BALLOON DILATION N/A 09/06/2014   Procedure: Larrie Kass DILATION;  Surgeon: [name], MD;  Location: [facility];  Service: Endoscopy;  Laterality: N/A;   BREAST LUMPECTOMY Left    DILATION AND CURETTAGE OF UTERUS  yrs ago   ESOPHAGEAL DILATION     ESOPHAGOGASTRODUODENOSCOPY (EGD) WITH PROPOFOL N/A 09/06/2014   Procedure: ESOPHAGOGASTRODUODENOSCOPY (EGD) WITH  PROPOFOL;  Surgeon: [name], MD;  Location: [facility];  Service: Endoscopy;  Laterality: N/A;   HYSTEROSCOPY WITH D & C N/A 12/08/2018   Procedure: DILATATION AND CURETTAGE /HYSTEROSCOPY;  Surgeon: Dian Queen, MD;  Location: [place];  Service: Gynecology;  Laterality: N/A;   TONSILLECTOMY     TUBAL LIGATION     VIDEO BRONCHOSCOPY Bilateral 08/23/2015   Procedure: VIDEO BRONCHOSCOPY WITH FLUORO;  Surgeon: Javier Glazier, MD;  Location: [facility];  Service: Cardiopulmonary;  Laterality: Bilateral;   Patient Active Problem List   Diagnosis Date Noted   Abdominal bloating 12/05/2019   Preventive measure 10/02/2019   Weight loss 10/02/2019   Other fatigue 10/02/2019   Uterine mass 11/18/2018   Dysuria 11/15/2018   Generalized abdominal pain 11/15/2018   Elevated liver enzymes 06/04/2017   Multiple lung nodules on CT 08/15/2015   Quality of life palliative care encounter 06/07/2015   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[place]) 11/15/2014   Left breast lump 11/15/2014   Dysphagia 08/11/2014   Chronic kidney disease (CKD), stage II (mild) 05/25/2014   Acute dystonic reaction due to drugs 05/25/2014   Bilateral lower extremity edema 05/25/2014   Chronic kidney disease, stage III (moderate) ([place]) 09/05/2013   Hemorrhoids 07/26/2013   Hyponatremia  07/26/2013   Leg edema 06/23/2013   Splenic marginal zone b-cell lymphoma ([place]) 05/30/2013   Thrombocytopenia ([place]) 10/10/2009   Other constipation 10/10/2009   NONSPECIFIC ABN FINDING RAD & OTH EXAM GI TRACT 09/06/2009   RECTAL BLEEDING 07/10/2009   HIP PAIN, LEFT 08/28/2008   SPRAIN&STRAIN OTHER SPECIFIED SITES KNEE&LEG 08/28/2008   OTHER NONTHROMBOCYTOPENIC PURPURAS 07/27/2008   WEIGHT LOSS, ABNORMAL 07/27/2008   DERMATOPHYTOSIS OF NAIL 06/04/2008   GERD 05/24/2008   Rectal bleeding 05/24/2008   HEMORRHOIDS 05/22/2008   ESOPHAGEAL STRICTURE 05/22/2008   HIATAL HERNIA 05/22/2008   PLANTAR WART  08/04/2007   INSOMNIA, SEVERE 08/04/2007   RAYNAUD'S SYNDROME 08/30/2006   IRRITABLE BOWEL SYNDROME 08/30/2006   DIARRHEA, CHRONIC 08/30/2006   REFERRING DIAG:  R10.9 (ICD-10-CM) - Abdominal pain, unspecified abdominal location  R19.8 (ICD-10-CM) - Abnormal defecation      THERAPY DIAG:  Other lack of coordination   Abdominal pain, unspecified abdominal location   Muscle weakness (generalized)   Rationale for Evaluation and Treatment: Rehabilitation   ONSET DATE: 04/2021   SUBJECTIVE:                                                                                                                                                                                            SUBJECTIVE STATEMENT:  I am doing better. The abdominal massage helps.  Fluid intake: Yes: decaf coffee, water     PAIN:  Are you having pain? Yes NPRS scale: 8/10 for 02/04/22 Pain location:  lower abdominal, suprapubic   Pain type: nagging pain Pain description: intermittent    Aggravating factors: random pain Relieving factors: randomly   PRECAUTIONS: Other: non- hodgkin's lymphoma ; ostoeporosis   WEIGHT BEARING RESTRICTIONS: No   FALLS:  Has patient fallen in last 6 months? No   LIVING ENVIRONMENT: Lives with: lives with their spouse   OCCUPATION: retired; floor exercises   PLOF: Independent   PATIENT GOALS: reduce pain and bloating   PERTINENT HISTORY:  Interstitial cystitis; chronic kidney disease; Hemorrhoids; IBS; Appendectomy; osteoporosis; Raynauds; Non hodgkins lymphoma   BOWEL MOVEMENT: Pain with bowel movement: Yes; after bowel movement Type of bowel movement:Type (Bristol Stool Scale) Type 5 or 6, Frequency when she takes the Linzess, and Strain no Fully empty rectum: No, bloating Leakage: yes, after a bowel movement a brown liquid comes out Pads: 1 pad per day Fiber supplement: Yes: Linzess   URINATION: Pain with urination: No, pain right now due to UTI Fully empty  bladder: No, hard to initiate the urine stream Stream:  weak to average Urgency: No Frequency: daytime: every hour; nighttime: 1 time Leakage:  none  INTERCOURSE: Pain with intercourse:  very painful and husband is diabetic Ability to have vaginal penetration:  No   PREGNANCY: Vaginal deliveries 2 Tearing No C-section deliveries 0 Currently pregnant No   PROLAPSE: None     OBJECTIVE:    DIAGNOSTIC FINDINGS:  none   PATIENT SURVEYS:  none   COGNITION: Overall cognitive status: Within functional limits for tasks assessed                  POSTURE: rounded shoulders, forward head, and increased thoracic kyphosis   PELVIC ALIGNMENT:   LUMBARAROM/PROM: Lumbar ROM decreased by 25%     LOWER EXTREMITY ROM: bilateral hip ROM is full    (Blank rows = not tested)   LOWER EXTREMITY MMT:   MMT Right eval Left eval Right/left 03/25/22  Hip extension 4/5 4/5 4/5  Hip abduction 4/5 4/5 4/5    PALPATION:   General  contracts the upper abdominals and not the lower. Tenderness though out the abdomen.                  External Perineal Exam tenderness located on the ischiocavernosus, urogenital diaphragm, levator ani                             Internal Pelvic Floor not assessed today   Patient does not want pelvic floor assessment internally.    PELVIC MMT:   MMT 03/25/22 04/08/22  Internal Anal Sphincter 2/5  2/5  External Anal Sphincter 2/5  2/5  Puborectalis 2/5  2/5  (Blank rows = not tested)         TONE: Not assessed today     TODAY'S TREATMENT:  04/08/22 Manual: Soft tissue mobilization: Manual work  to the abdomen to promote peristalic motion of the intestines Manual work to diaphragm Myofascial release: Fascial release of the lower abdomen and midline Tissue rolling of the upper abdomen Exercises: Strengthening: Discussed with patient on her HEP, which exercises to continue with and the importance of the exercises.    04/01/22 Manual: Soft  tissue mobilization: Manual work  to the abdomen to promote peristalic motion of the intestines Manual work to diaphragm Scar tissue mobilization: Myofascial release: Fascial release of the lower abdomen and midline Tissue rolling of the upper abdomen Neuromuscular re-education: Down training: Diaphragmatic breathing in supine with tactile and verbal cues, placed weight on the abdomen to assist with filling up the abdomen Exercises: Strengthening: Nustep for 6 minutes while assessing patient Bridge with ball squeeze 10x with verbal cues to raise hips up Supine with ball between knees and march   03/25/22 Manual: Soft tissue mobilization: Manual work  to the abdomen to promote peristalic motion of the intestines.  Internal pelvic floor techniques: No emotional/communication barriers or cognitive limitation. Patient is motivated to learn. Patient understands and agrees with treatment goals and plan. PT explains patient will be examined in standing, sitting, and lying down to see how their muscles and joints work. When they are ready, they will be asked to remove their underwear so PT can examine their perineum. The patient is also given the option of providing their own chaperone as one is not provided in our facility. The patient also has the right and is explained the right to defer or refuse any part of the evaluation or treatment including the internal exam. With the patient's consent, PT will use one gloved finger to gently assess the muscles of the pelvic  floor, seeing how well it contracts and relaxes and if there is muscle symmetry. After, the patient will get dressed and PT and patient will discuss exam findings and plan of care. PT and patient discuss plan of care, schedule, attendance policy and HEP activities.  Going through the rectum with manual work to the puborectalis to improve forward contraction Therapist finger in the rectum giving tactile cues to contract the pelvic floor with  trying to pull finger out of the rectum and patient was able to contract for 5 seconds instead of 2.  Neuromuscular re-education: Pelvic floor contraction training: Pelvic floor contraction with therapist finger in the rectum to facilitate a contraction and the forward movement of the puborectalis.       PATIENT EDUCATION:  04/08/2022 Education details: Access Code: [email or web address]; abdominal massage, sitting with legs uncrossed Person educated: Patient Education method: Explanation, Demonstration, Tactile cues, Verbal cues, and Handouts Education comprehension: verbalized understanding, returned demonstration, verbal cues required, tactile cues required, and needs further education   HOME EXERCISE PROGRAM: 04/08/2022 Access Code: [email or web address] URL: [email or web address] Date: 03/25/2022 Prepared by: Earlie Counts   Program Notes try to sit with legs not crossed sit with ball between arm and trunk, breath into the ball and feel the rib cage open up 10x each side daily   Exercises - Supine Abdominal Wall Massage  - 1 x daily - 7 x weekly - 1 sets - 10 reps - Supine Transversus Abdominis Bracing - Hands on Thighs  - 1 x daily - 7 x weekly - 1 sets - 10 reps - 5 sec hold - Supine Diaphragmatic Breathing  - 2 x daily - 7 x weekly - 1 sets - 10 reps - Seated Pelvic Floor Contraction with Isometric Hip Adduction  - 3 x daily - 7 x weekly - 1 sets - 10 reps - 5 sec hold - Seated Pelvic Floor Contraction  - 3 x daily - 7 x weekly - 1 sets - 10 reps - 3 sec hold - Sidelying Pelvic Floor Contraction with Self-Palpation  - 3 x daily - 7 x weekly - 1 sets - 5 reps - 5 sec hold   ASSESSMENT:   CLINICAL IMPRESSION: Patient is a 72 y.o. female who was seen today for physical therapy treatment for abdominal pain and abnormal defecation. Patient pelvic floor strength is 2/5. She is understanding how to manage her abdominal pain and bloating with abdominal massage and diaphragmatic breathing. Patient  is having less abdominal pain. She has small amount of brown liquid that leaks through the rectum and wears a pad. Patient is independent with HEP. She will need to stop therapy at this time due to taking care of her family members.   OBJECTIVE IMPAIRMENTS: decreased coordination, decreased strength, increased fascial restrictions, and pain.    ACTIVITY LIMITATIONS: continence and toileting   PARTICIPATION LIMITATIONS: shopping and community activity   PERSONAL FACTORS: Age, Fitness, and 3+ comorbidities: Interstitial cystitis; chronic kidney disease; Hemorrhoids; IBS; Appendectomy; osteoporosis; Raynauds; Non hodgkins lymphoma  are also affecting patient's functional outcome.    REHAB POTENTIAL: Excellent   CLINICAL DECISION MAKING: Evolving/moderate complexity   EVALUATION COMPLEXITY: Moderate     GOALS: Goals reviewed with patient? Yes   SHORT TERM GOALS: Target date: 12/22/2021   Patient educated on abdominal massage to reduce trigger points and improve peristalic motion.  Baseline: Goal status: Met 12/29/2021   2.  Patient able to perform diaphragmatic breathing to reduce tension in the pelvic floor.  Baseline:  Goal status: Met 02/04/22   3.  Patient able to contract the upper and lower abdominals together.  Baseline:  Goal status: Met 04/01/22   4.  Patient reports her abdominal pain has decreased >/= 25%.  Baseline:  Goal status: Met 01/28/2022     LONG TERM GOALS: Target date: 02/16/2022    Patient independent with advanced HEP for core and pelvic floor to reduce fecal leakage and abdominal pain.  Baseline:  Goal status: ongoing 03/25/22   2.  Patient reports her abdominal pain decreased </= 1/10 due to correct contraction and reduction of trigger poinrs.  Baseline:  Goal status: Met 04/08/22   3.  Pelvic floor strength is increased due to stool leakage after a bowel movement decreased >/= 50%.  Baseline:  Goal status: Partially met 04/08/22   4.  Patient educated  on correct toileting to assist her to feel she has fully emptied her rectum.  Baseline:  Goal status: Met 03/29/22   5.  Patient reports her abdominal pain decreased </= 1/10 to improve her overall quality of life.  Baseline:  Goal status: Not met 04/08/22    PLAN: Discharge to [place], PT 04/08/22 2:37 PM    PHYSICAL THERAPY DISCHARGE SUMMARY  Visits from Start of Care: 7  Current functional level related to goals / functional outcomes: See above.    Remaining deficits: See above.    Education / Equipment: HEP   Patient agrees to discharge. Patient goals were partially met. Patient is being discharged due to the patient's request.to take care of family members. Thank you for the referral. Earlie Counts, PT 04/08/22 2:44 PM

## 2022-04-15 ENCOUNTER — Encounter: Payer: PPO | Admitting: Physical Therapy

## 2022-05-27 ENCOUNTER — Other Ambulatory Visit: Payer: Self-pay | Admitting: Gastroenterology

## 2022-06-03 ENCOUNTER — Other Ambulatory Visit: Payer: Self-pay | Admitting: Gastroenterology

## 2022-06-12 ENCOUNTER — Other Ambulatory Visit: Payer: Self-pay | Admitting: Gastroenterology

## 2022-09-18 ENCOUNTER — Other Ambulatory Visit: Payer: Self-pay | Admitting: Physician Assistant

## 2022-10-09 ENCOUNTER — Other Ambulatory Visit: Payer: Self-pay | Admitting: Internal Medicine

## 2022-10-19 ENCOUNTER — Other Ambulatory Visit: Payer: Self-pay | Admitting: Internal Medicine

## 2022-10-23 ENCOUNTER — Other Ambulatory Visit: Payer: Self-pay | Admitting: Physician Assistant

## 2022-10-25 ENCOUNTER — Other Ambulatory Visit: Payer: Self-pay | Admitting: Internal Medicine

## 2022-11-11 ENCOUNTER — Other Ambulatory Visit (HOSPITAL_COMMUNITY): Payer: Self-pay | Admitting: Adult Health

## 2022-11-11 DIAGNOSIS — F419 Anxiety disorder, unspecified: Secondary | ICD-10-CM | POA: Diagnosis not present

## 2022-11-11 DIAGNOSIS — R1084 Generalized abdominal pain: Secondary | ICD-10-CM | POA: Diagnosis not present

## 2022-11-11 DIAGNOSIS — Z1389 Encounter for screening for other disorder: Secondary | ICD-10-CM | POA: Diagnosis not present

## 2022-11-11 DIAGNOSIS — Z23 Encounter for immunization: Secondary | ICD-10-CM | POA: Diagnosis not present

## 2022-11-11 DIAGNOSIS — R140 Abdominal distension (gaseous): Secondary | ICD-10-CM | POA: Diagnosis not present

## 2022-11-11 DIAGNOSIS — C8580 Other specified types of non-Hodgkin lymphoma, unspecified site: Secondary | ICD-10-CM

## 2022-11-11 DIAGNOSIS — Z0000 Encounter for general adult medical examination without abnormal findings: Secondary | ICD-10-CM | POA: Diagnosis not present

## 2022-11-11 DIAGNOSIS — K582 Mixed irritable bowel syndrome: Secondary | ICD-10-CM | POA: Diagnosis not present

## 2022-11-11 DIAGNOSIS — D696 Thrombocytopenia, unspecified: Secondary | ICD-10-CM | POA: Diagnosis not present

## 2022-11-13 ENCOUNTER — Ambulatory Visit (HOSPITAL_COMMUNITY)
Admission: RE | Admit: 2022-11-13 | Discharge: 2022-11-13 | Disposition: A | Discharge status: Home / Self Care | Payer: PPO | Source: Ambulatory Visit | Attending: Adult Health | Admitting: Adult Health

## 2022-11-13 DIAGNOSIS — K449 Diaphragmatic hernia without obstruction or gangrene: Secondary | ICD-10-CM | POA: Diagnosis not present

## 2022-11-13 DIAGNOSIS — R1084 Generalized abdominal pain: Secondary | ICD-10-CM | POA: Diagnosis not present

## 2022-11-13 DIAGNOSIS — N182 Chronic kidney disease, stage 2 (mild): Secondary | ICD-10-CM | POA: Insufficient documentation

## 2022-11-13 DIAGNOSIS — D696 Thrombocytopenia, unspecified: Secondary | ICD-10-CM | POA: Diagnosis not present

## 2022-11-13 DIAGNOSIS — C8580 Other specified types of non-Hodgkin lymphoma, unspecified site: Secondary | ICD-10-CM | POA: Diagnosis not present

## 2022-11-13 DIAGNOSIS — K828 Other specified diseases of gallbladder: Secondary | ICD-10-CM | POA: Diagnosis not present

## 2022-11-13 DIAGNOSIS — R140 Abdominal distension (gaseous): Secondary | ICD-10-CM | POA: Diagnosis not present

## 2022-11-13 DIAGNOSIS — C8590 Non-Hodgkin lymphoma, unspecified, unspecified site: Secondary | ICD-10-CM | POA: Diagnosis not present

## 2022-11-13 LAB — POCT I-STAT CREATININE: Creatinine, Ser: 1.3 mg/dL — ABNORMAL HIGH (ref 0.44–1.00)

## 2022-11-13 MED ORDER — IOHEXOL 9 MG/ML PO SOLN
ORAL | Status: AC
  Filled 2022-11-13: qty 1000

## 2022-11-13 MED ORDER — IOHEXOL 9 MG/ML PO SOLN
500.0000 mL | ORAL | Status: AC
  Administered 2022-11-13 (×2): 500 mL via ORAL

## 2022-11-13 MED ORDER — IOHEXOL 300 MG/ML  SOLN
100.0000 mL | Freq: Once | INTRAMUSCULAR | Status: AC | PRN
  Administered 2022-11-13: 100 mL via INTRAVENOUS

## 2022-11-17 ENCOUNTER — Other Ambulatory Visit (HOSPITAL_COMMUNITY): Payer: Self-pay | Admitting: Internal Medicine

## 2022-11-17 DIAGNOSIS — R1084 Generalized abdominal pain: Secondary | ICD-10-CM

## 2022-11-19 ENCOUNTER — Encounter (HOSPITAL_COMMUNITY)
Admission: RE | Admit: 2022-11-19 | Discharge: 2022-11-19 | Disposition: A | Discharge status: Home / Self Care | Payer: PPO | Source: Ambulatory Visit | Attending: Internal Medicine | Admitting: Internal Medicine

## 2022-11-19 DIAGNOSIS — R1011 Right upper quadrant pain: Secondary | ICD-10-CM | POA: Diagnosis not present

## 2022-11-19 DIAGNOSIS — R1084 Generalized abdominal pain: Secondary | ICD-10-CM | POA: Diagnosis not present

## 2022-11-19 MED ORDER — TECHNETIUM TC 99M MEBROFENIN IV KIT
5.0000 | PACK | Freq: Once | INTRAVENOUS | Status: AC | PRN
  Administered 2022-11-19: 5 via INTRAVENOUS

## 2022-11-22 ENCOUNTER — Other Ambulatory Visit: Payer: Self-pay | Admitting: Internal Medicine

## 2022-11-22 ENCOUNTER — Other Ambulatory Visit: Payer: Self-pay | Admitting: Physician Assistant

## 2022-11-23 ENCOUNTER — Ambulatory Visit (HOSPITAL_COMMUNITY): Admission: RE | Admit: 2022-11-23 | Payer: PPO | Source: Ambulatory Visit

## 2022-12-08 ENCOUNTER — Other Ambulatory Visit: Payer: Self-pay | Admitting: Surgery

## 2022-12-08 DIAGNOSIS — K828 Other specified diseases of gallbladder: Secondary | ICD-10-CM | POA: Diagnosis not present

## 2022-12-11 NOTE — Progress Notes (Signed)
Surgical Instructions   Your procedure is scheduled on Wednesday, December 11th, 2024. Report to [facility] Main Entrance "A" at 11:30 A.M., then check in with the Admitting office. Any questions or running late day of surgery: call [phone]  Questions prior to your surgery date: call [phone], Monday-Friday, 8am-4pm. If you experience any cold or flu symptoms such as cough, fever, chills, shortness of breath, etc. between now and your scheduled surgery, please notify us at the above number.     Remember:  Do not eat after midnight the night before your surgery   You may drink clear liquids until 10:30 the morning of your surgery.   Clear liquids allowed are: Water, Non-Citrus Juices (without pulp), Carbonated Beverages, Clear Tea (no milk, honey, etc.), Black Coffee Only (NO MILK, CREAM OR POWDERED CREAMER of any kind), and Gatorade.  Patient Instructions  The night before surgery:  No food after midnight. ONLY clear liquids after midnight  The day of surgery (if you do NOT have diabetes):  Drink ONE (1) Pre-Surgery Clear Ensure by 10:30 the morning of surgery. Drink in one sitting. Do not sip.  This drink was given to you during your hospital  pre-op appointment visit.  Nothing else to drink after completing the  Pre-Surgery Clear Ensure.          If you have questions, please contact your surgeon's office.     Take these medicines the morning of surgery with A SIP OF WATER: Pantoprazole (Protonix) Metoclopramide (Reglan)   May take these medicines IF NEEDED: Dicyclomine (Bentyl) Hydrocodone-acetaminophen (Norco)    One week prior to surgery, STOP taking any Aspirin (unless otherwise instructed by your surgeon) Aleve, Naproxen, Ibuprofen, Motrin, Advil, Goody's, BC's, all herbal medications, fish oil, and non-prescription vitamins.                     Do NOT Smoke (Tobacco/Vaping) for 24 hours prior to your procedure.  If you use a CPAP at night, you may  bring your mask/headgear for your overnight stay.   You will be asked to remove any contacts, glasses, piercing's, hearing aid's, dentures/partials prior to surgery. Please bring cases for these items if needed.    Patients discharged the day of surgery will not be allowed to drive home, and someone needs to stay with them for 24 hours.  SURGICAL WAITING ROOM VISITATION Patients may have no more than 2 support people in the waiting area - these visitors may rotate.   Pre-op nurse will coordinate an appropriate time for 1 ADULT support person, who may not rotate, to accompany patient in pre-op.  Children under the age of 22 must have an adult with them who is not the patient and must remain in the main waiting area with an adult.  If the patient needs to stay at the hospital during part of their recovery, the visitor guidelines for inpatient rooms apply.  Please refer to the [facility] website for the visitor guidelines for any additional information.   If you received a COVID test during your pre-op visit  it is requested that you wear a mask when out in public, stay away from anyone that may not be feeling well and notify your surgeon if you develop symptoms. If you have been in contact with anyone that has tested positive in the last 10 days please notify you surgeon.      Pre-operative CHG Bathing Instructions   You can play a key role in reducing the  risk of infection after surgery. Your skin needs to be as free of germs as possible. You can reduce the number of germs on your skin by washing with CHG (chlorhexidine gluconate) soap before surgery. CHG is an antiseptic soap that kills germs and continues to kill germs even after washing.   DO NOT use if you have an allergy to chlorhexidine/CHG or antibacterial soaps. If your skin becomes reddened or irritated, stop using the CHG and notify one of our RNs at [phone].              TAKE A SHOWER THE NIGHT BEFORE SURGERY AND THE DAY OF  SURGERY    Please keep in mind the following:  DO NOT shave, including legs and underarms, 48 hours prior to surgery.   You may shave your face before/day of surgery.  Place clean sheets on your bed the night before surgery Use a clean washcloth (not used since being washed) for each shower. DO NOT sleep with pet's night before surgery.  CHG Shower Instructions:  Wash your face and private area with normal soap. If you choose to wash your hair, wash first with your normal shampoo.  After you use shampoo/soap, rinse your hair and body thoroughly to remove shampoo/soap residue.  Turn the water OFF and apply half the bottle of CHG soap to a CLEAN washcloth.  Apply CHG soap ONLY FROM YOUR NECK DOWN TO YOUR TOES (washing for 3-5 minutes)  DO NOT use CHG soap on face, private areas, open wounds, or sores.  Pay special attention to the area where your surgery is being performed.  If you are having back surgery, having someone wash your back for you may be helpful. Wait 2 minutes after CHG soap is applied, then you may rinse off the CHG soap.  Pat dry with a clean towel  Put on clean pajamas    Additional instructions for the day of surgery: DO NOT APPLY any lotions, deodorants, cologne, or perfumes.   Do not wear jewelry or makeup Do not wear nail polish, gel polish, artificial nails, or any other type of covering on natural nails (fingers and toes) Do not bring valuables to the hospital. [facility] is not responsible for valuables/personal belongings. Put on clean/comfortable clothes.  Please brush your teeth.  Ask your nurse before applying any prescription medications to the skin.

## 2022-12-14 ENCOUNTER — Inpatient Hospital Stay (HOSPITAL_COMMUNITY)
Admission: RE | Admit: 2022-12-14 | Discharge: 2022-12-14 | Disposition: A | Discharge status: Home / Self Care | Payer: PPO | Source: Ambulatory Visit

## 2022-12-19 ENCOUNTER — Other Ambulatory Visit: Payer: Self-pay | Admitting: Internal Medicine

## 2022-12-24 NOTE — Patient Instructions (Addendum)
SURGICAL WAITING ROOM VISITATION Patients having surgery or a procedure may have no more than 2 support people in the waiting area - these visitors may rotate in the visitor waiting room.   Due to an increase in RSV and influenza rates and associated hospitalizations, children ages 39 and under may not visit patients in [facility] hospitals. If the patient needs to stay at the hospital during part of their recovery, the visitor guidelines for inpatient rooms apply.  PRE-OP VISITATION  Pre-op nurse will coordinate an appropriate time for 1 support person to accompany the patient in pre-op.  This support person may not rotate.  This visitor will be contacted when the time is appropriate for the visitor to come back in the pre-op area.  Please refer to the [facility] website for the visitor guidelines for Inpatients (after your surgery is over and you are in a regular room).  You are not required to quarantine at this time prior to your surgery. However, you must do this: Hand Hygiene often Do NOT share personal items Notify your provider if you are in close contact with someone who has COVID or you develop fever 100.4 or greater, new onset of sneezing, cough, sore throat, shortness of breath or body aches.  If you test positive for Covid or have been in contact with anyone that has tested positive in the last 10 days please notify you surgeon.    Your procedure is scheduled on:  Wednesday  December 30, 2022  Report to [facility] Main Entrance: [facility]sen entrance where the [employer] is available.   Report to admitting at:  11:15   AM  Call this number if you have any questions or problems the morning of surgery [phone]  Do not eat food after Midnight the night prior to your surgery/procedure.  After Midnight you may have the following liquids until  10:30 AM  DAY OF SURGERY  Clear Liquid Diet Water Black Coffee (sugar ok, NO MILK/CREAM OR CREAMERS)  Tea (sugar ok, NO  MILK/CREAM OR CREAMERS) regular and decaf                             Plain Jell-O  with no fruit (NO RED)                                           Fruit ices (not with fruit pulp, NO RED)                                     Popsicles (NO RED)                                                                  Juice: NO CITRUS JUICES: only apple, WHITE grape, WHITE cranberry Sports drinks like Gatorade or Powerade (NO RED)                   The day of surgery:  Drink ONE (1) Pre-Surgery Clear Ensure at   10:30  AM the morning  of surgery. Drink in one sitting. Do not sip.  This drink was given to you during your hospital pre-op appointment visit. Nothing else to drink after completing the Pre-Surgery Clear Ensure : No candy, chewing gum or throat lozenges.    FOLLOW ANY ADDITIONAL PRE OP INSTRUCTIONS YOU RECEIVED FROM YOUR SURGEON'S OFFICE!!!   Oral Hygiene is also important to reduce your risk of infection.        Remember - BRUSH YOUR TEETH THE MORNING OF SURGERY WITH YOUR REGULAR TOOTHPASTE  Do NOT smoke after Midnight the night before surgery.  STOP TAKING all Vitamins, Herbs and supplements 1 week before your surgery.   Take ONLY these medicines the morning of surgery with A SIP OF WATER: pantoprazole, you may take Hydrocodone / APAP if needed for pain.                      You may not have any metal on your body including hair pins, jewelry, and body piercing  Do not wear make-up, lotions, powders, perfumes or deodorant  Do not wear nail polish including gel and S&S, artificial / acrylic nails, or any other type of covering on natural nails including finger and toenails. If you have artificial nails, gel coating, etc., that needs to be removed by a nail salon, Please have this removed prior to surgery. Not doing so may mean that your surgery could be cancelled or delayed if the Surgeon or anesthesia staff feels like they are unable to monitor you safely.   Do not shave 48 hours  prior to surgery to avoid nicks in your skin which may contribute to postoperative infections.   Contacts, Hearing Aids, dentures or bridgework may not be worn into surgery. DENTURES WILL BE REMOVED PRIOR TO SURGERY PLEASE DO NOT APPLY "Poly grip" OR ADHESIVES!!!  You may bring a small overnight bag with you on the day of surgery, only pack items that are not valuable. [facility] IS NOT RESPONSIBLE   FOR VALUABLES THAT ARE LOST OR STOLEN.   Do not bring your home medications to the hospital. The Pharmacy will dispense medications listed on your medication list to you during your admission in the Hospital.  Please read over the following fact sheets you were given: IF YOU HAVE QUESTIONS ABOUT YOUR PRE-OP INSTRUCTIONS, PLEASE CALL [phone]   [facility] - Preparing for Surgery Before surgery, you can play an important role.  Because skin is not sterile, your skin needs to be as free of germs as possible.  You can reduce the number of germs on your skin by washing with CHG (chlorahexidine gluconate) soap before surgery.  CHG is an antiseptic cleaner which kills germs and bonds with the skin to continue killing germs even after washing. Please DO NOT use if you have an allergy to CHG or antibacterial soaps.  If your skin becomes reddened/irritated stop using the CHG and inform your nurse when you arrive at Short Stay. Do not shave (including legs and underarms) for at least 48 hours prior to the first CHG shower.  You may shave your face/neck.  Please follow these instructions carefully:  1.  Shower with CHG Soap the night before surgery and the  morning of surgery.  2.  If you choose to wash your hair, wash your hair first as usual with your normal  shampoo.  3.  After you shampoo, rinse your hair and body thoroughly to remove the shampoo.  4.  Use CHG as you would any other liquid soap.  You can apply chg directly to the skin and wash.  Gently with a scrungie or  clean washcloth.  5.  Apply the CHG Soap to your body ONLY FROM THE NECK DOWN.   Do not use on face/ open                           Wound or open sores. Avoid contact with eyes, ears mouth and genitals (private parts).                       Wash face,  Genitals (private parts) with your normal soap.             6.  Wash thoroughly, paying special attention to the area where your  surgery  will be performed.  7.  Thoroughly rinse your body with warm water from the neck down.  8.  DO NOT shower/wash with your normal soap after using and rinsing off the CHG Soap.            9.  Pat yourself dry with a clean towel.            10.  Wear clean pajamas.            11.  Place clean sheets on your bed the night of your first shower and do not  sleep with pets.  ON THE DAY OF SURGERY : Do not apply any lotions/deodorants the morning of surgery.  Please wear clean clothes to the hospital/surgery center.     FAILURE TO FOLLOW THESE INSTRUCTIONS MAY RESULT IN THE CANCELLATION OF YOUR SURGERY  PATIENT SIGNATURE_________________________________  NURSE SIGNATURE__________________________________  ________________________________________________________________________

## 2022-12-24 NOTE — Progress Notes (Addendum)
COVID Vaccine received:  []  No [x]  Yes Date of any COVID positive Test in last 90 days: none  PCP - Alysia Penna, MD [phone] (Work)  [phone] (Fax)  Cardiologist - none  Chest x-ray -  EKG - 02-16-2018 Epic     Will repeat  Stress Test -  ECHO -  Cardiac Cath -   PCR screen: []  Ordered & Completed []   No Order but Needs PROFEND     [x]   N/A for this surgery  Surgery Plan:  []  Ambulatory   [x]  Outpatient in bed  []  Admit Anesthesia:    [x]  General  []  Spinal  []   Choice []   MAC  Bowel Prep - []  No  []   Yes __clears day prior  Pacemaker / ICD device [x]  No []  Yes   Spinal Cord Stimulator:[x]  No []  Yes       History of Sleep Apnea? [x]  No []  Yes   CPAP used?- [x]  No []  Yes    Does the patient monitor blood sugar?   [x]  N/A   []  No []  Yes  Patient has: [x]  NO Hx DM   []  Pre-DM   []  DM1  []   DM2  Blood Thinner / Instructions: none Aspirin Instructions:  none  ERAS Protocol Ordered: []  No  [x]  Yes PRE-SURGERY [x]  ENSURE  []  G2   Patient is to be NPO after: 1030  Dental hx: []  Dentures:  []  N/A      []  Bridge or Partial: permanent bridge                   []  Loose or Damaged teeth:    Activity level: Patient is able climb a flight of stairs without difficulty; [x]  No CP  [x]  No SOB. Patient can  perform ADLs without assistance.   Anesthesia review: GERD, CKD3, thrombocytopenia, Raynaud's, Non-hodgkins' Lymphoma, PONV,    Patient denies shortness of breath, fever, cough and chest pain at PAT appointment.  Patient verbalized understanding and agreement to the Pre-Surgical Instructions that were given to them at this PAT appointment. Patient was also educated of the need to review these PAT instructions again prior to her surgery.I reviewed the appropriate phone numbers to call if they have any and questions or concerns.

## 2022-12-25 ENCOUNTER — Encounter (HOSPITAL_COMMUNITY)
Admission: RE | Admit: 2022-12-25 | Discharge: 2022-12-25 | Disposition: A | Discharge status: Home / Self Care | Payer: PPO | Source: Ambulatory Visit | Attending: Orthopaedic Surgery

## 2022-12-25 ENCOUNTER — Encounter (HOSPITAL_COMMUNITY): Payer: Self-pay

## 2022-12-25 ENCOUNTER — Other Ambulatory Visit: Payer: Self-pay

## 2022-12-25 VITALS — BP 127/69 | HR 74 | Temp 98.0°F | Resp 12 | Ht 67.0 in | Wt 138.0 lb

## 2022-12-25 DIAGNOSIS — R748 Abnormal levels of other serum enzymes: Secondary | ICD-10-CM | POA: Diagnosis not present

## 2022-12-25 DIAGNOSIS — N183 Chronic kidney disease, stage 3 unspecified: Secondary | ICD-10-CM | POA: Diagnosis not present

## 2022-12-25 DIAGNOSIS — E871 Hypo-osmolality and hyponatremia: Secondary | ICD-10-CM | POA: Diagnosis not present

## 2022-12-25 DIAGNOSIS — Z01818 Encounter for other preprocedural examination: Secondary | ICD-10-CM | POA: Insufficient documentation

## 2022-12-25 DIAGNOSIS — Z01812 Encounter for preprocedural laboratory examination: Secondary | ICD-10-CM | POA: Diagnosis present

## 2022-12-25 DIAGNOSIS — Z01810 Encounter for preprocedural cardiovascular examination: Secondary | ICD-10-CM | POA: Diagnosis present

## 2022-12-25 LAB — CBC
HCT: 42.6 % (ref 36.0–46.0)
Hemoglobin: 14.1 g/dL (ref 12.0–15.0)
MCH: 31.3 pg (ref 26.0–34.0)
MCHC: 33.1 g/dL (ref 30.0–36.0)
MCV: 94.7 fL (ref 80.0–100.0)
Platelets: 136 10*3/uL — ABNORMAL LOW (ref 150–400)
RBC: 4.5 MIL/uL (ref 3.87–5.11)
RDW: 12.2 % (ref 11.5–15.5)
WBC: 6.2 10*3/uL (ref 4.0–10.5)
nRBC: 0 % (ref 0.0–0.2)

## 2022-12-25 LAB — BASIC METABOLIC PANEL
Anion gap: 11 (ref 5–15)
BUN: 26 mg/dL — ABNORMAL HIGH (ref 8–23)
CO2: 24 mmol/L (ref 22–32)
Calcium: 9.4 mg/dL (ref 8.9–10.3)
Chloride: 102 mmol/L (ref 98–111)
Creatinine, Ser: 1.18 mg/dL — ABNORMAL HIGH (ref 0.44–1.00)
GFR, Estimated: 48 mL/min — ABNORMAL LOW (ref >60)
Glucose, Bld: 85 mg/dL (ref 70–99)
Potassium: 4.2 mmol/L (ref 3.5–5.1)
Sodium: 137 mmol/L (ref 135–145)

## 2022-12-29 NOTE — H&P (Signed)
REFERRING PHYSICIAN: Michaela Corner, MD PROVIDER: Wayne Both, MD MRN: [id number] DOB: 02/29/1950 DATE OF ENCOUNTER: 12/08/2022 Subjective   Chief Complaint: New Consultation ( Dysfunctional gallbladder, )  History of Present Illness: Shannon Barajas is a 72 y.o. female who is seen today as an office consultation for evaluation of New Consultation ( Dysfunctional gallbladder, )  This is a 72 year old female is referred by gastroenterology for biliary dyskinesia. She has a previous history of lymphoma as well as a history of Schatzki's ring and irritable bowel syndrome. She has been followed by gastroenterology for a while. She has had a previous ultrasound showing no evidence of gallstones. She also had recent upper and lower endoscopy with dilation of her esophagus but no other abnormalities other than a benign polyp. Her most recent CT scans for surveillance of her lymphoma including the chest, abdomen, and pelvis were unremarkable.  She reports abdominal pain, bloating, and nausea which has been worse over the last several months. She does have chronic constipation and is on medication for this. Discomfort can be worse with fatty meals. She underwent a recent HIDA scan showing a 10% gallbladder ejection fraction.   Review of Systems: A complete review of systems was obtained from the patient. I have reviewed this information and discussed as appropriate with the patient. See HPI as well for other ROS.  ROS   Medical History: Past Medical History:  Diagnosis Date  Anemia  Chronic kidney disease  Esophageal stricture  GERD (gastroesophageal reflux disease)  Hiatal hernia  IBS (irritable bowel syndrome)  Interstitial cystitis  Raynaud's disease /phenomenon  Splenomegaly   Patient Active Problem List  Diagnosis  Marginal zone lymphoma (CMS/HHS-HCC)  Splenomegaly   Past Surgical History:  Procedure Laterality Date  APPENDECTOMY  breast lumpectomy  CATARACT  EXTRACTION  esophageal dilatation  LAPAROSCOPIC TUBAL LIGATION    Allergies  Allergen Reactions  Biaxin [Clarithromycin] Unknown  Erythromycin Unknown  Soy Other (See Comments)  Arregevate stomach  Zithromax [Azithromycin] Unknown   Current Outpatient Medications on File Prior to Visit  Medication Sig Dispense Refill  dicyclomine (BENTYL) 20 mg tablet TAKE 1 TABLET BY MOUTH TWICE DAILY AS NEEDED FOR SPASMS ( ABDOMINAL PAIN)  estradiol (ESTRACE) 1 MG tablet 1 mg once daily. Takes for 90 days the progesterone for 10 days 99  famotidine (PEPCID) 20 MG tablet Take 20 mg by mouth at bedtime  linaclotide (LINZESS) 290 mcg Cap Take by mouth.  metoclopramide (REGLAN) 5 MG tablet 5 mg 2 (two) times daily. 3  pantoprazole (PROTONIX) 40 MG DR tablet Take 40 mg by mouth once daily  progesterone (PROMETRIUM) 100 MG capsule 100 mg. For 12 days after Estradiol 0  thiamine (VITAMIN B-1) 50 MG tablet Take 50 mg by mouth once daily  ascorbic acid, vitamin C, (VITAMIN C) 500 MG tablet Take 500 mg by mouth once daily  folic acid (FOLVITE) 1 MG tablet Take by mouth.  INTEGRA 125-40-3 mg Cap 1 tablet once daily. 3  temazepam (RESTORIL) 30 mg capsule 30 mg. Takes 2 nightly 1  traZODone (DESYREL) 100 MG tablet Take by mouth.   No current facility-administered medications on file prior to visit.   Family History  Problem Relation Age of Onset  Lymphoma Father  Liver cancer Maternal Grandmother  Colon cancer Maternal Grandfather    Social History   Tobacco Use  Smoking Status Never  Smokeless Tobacco Never    Social History   Socioeconomic History  Marital status: Married  Tobacco Use  Smoking  status: Never  Smokeless tobacco: Never   Objective:   Vitals:  12/08/22 1427  BP: 132/80  Pulse: 104  Temp: (!) -13.9 C (7 F)  SpO2: 99%  Weight: 63.5 kg (140 lb)  Height: 170.2 cm (5\' 7" )   Body mass index is 21.93 kg/m.  Physical Exam   She appears well on exam  Her abdomen is  soft but bloated. There is very slight tenderness with guarding in the right upper quadrant. There is no hepatomegaly  Labs, Imaging and Diagnostic Testing: I have reviewed the notes from gastroenterology. I have reviewed the reports of her upper and lower endoscopy. I have reviewed her CT scan of the chest, abdomen, and pelvis as well as her ultrasounds of the gallbladder and HIDA scan  Assessment and Plan:   Diagnoses and all orders for this visit:  Biliary dyskinesia   Based on the HIDA scan she does have biliary dyskinesia and I suspect she will also have some chronic cholecystitis given her symptoms. We discussed continue conservative management versus proceeding with a laparoscopic cholecystectomy. Because of the worsening of her discomfort, she wishes to proceed with surgery. I explained the surgical procedure in detail and I gave her literature regarding the surgery. We discussed the risk of surgery which includes but is not limited to bleeding, infection, bile duct injury, bile leak, injury to surrounding blood vessels, the need to convert to an open procedure, cardiopulmonary issues, DVT, and the chance this may not resolve her symptoms. We also discussed postoperative recovery. After thorough discussion, she wished to proceed with surgery. Surgery will be scheduled as soon as possible

## 2022-12-30 ENCOUNTER — Ambulatory Visit (HOSPITAL_BASED_OUTPATIENT_CLINIC_OR_DEPARTMENT_OTHER): Payer: PPO | Admitting: Anesthesiology

## 2022-12-30 ENCOUNTER — Other Ambulatory Visit: Payer: Self-pay

## 2022-12-30 ENCOUNTER — Observation Stay (HOSPITAL_COMMUNITY)
Admission: RE | Admit: 2022-12-30 | Discharge: 2022-12-31 | Disposition: A | Discharge status: Home / Self Care | Payer: PPO | Attending: Surgery | Admitting: Surgery

## 2022-12-30 ENCOUNTER — Encounter (HOSPITAL_COMMUNITY): Payer: Self-pay | Admitting: Surgery

## 2022-12-30 ENCOUNTER — Encounter (HOSPITAL_COMMUNITY)
Admission: RE | Disposition: A | Discharge status: Home / Self Care | Payer: Self-pay | Source: Home / Self Care | Attending: Surgery

## 2022-12-30 ENCOUNTER — Ambulatory Visit (HOSPITAL_COMMUNITY): Payer: PPO | Admitting: Anesthesiology

## 2022-12-30 DIAGNOSIS — K838 Other specified diseases of biliary tract: Secondary | ICD-10-CM

## 2022-12-30 DIAGNOSIS — Z9049 Acquired absence of other specified parts of digestive tract: Principal | ICD-10-CM

## 2022-12-30 DIAGNOSIS — Z79899 Other long term (current) drug therapy: Secondary | ICD-10-CM | POA: Insufficient documentation

## 2022-12-30 DIAGNOSIS — K811 Chronic cholecystitis: Secondary | ICD-10-CM | POA: Diagnosis not present

## 2022-12-30 DIAGNOSIS — K828 Other specified diseases of gallbladder: Principal | ICD-10-CM | POA: Insufficient documentation

## 2022-12-30 DIAGNOSIS — N189 Chronic kidney disease, unspecified: Secondary | ICD-10-CM | POA: Diagnosis not present

## 2022-12-30 DIAGNOSIS — D631 Anemia in chronic kidney disease: Secondary | ICD-10-CM | POA: Diagnosis not present

## 2022-12-30 HISTORY — PX: CHOLECYSTECTOMY: SHX55

## 2022-12-30 SURGERY — LAPAROSCOPIC CHOLECYSTECTOMY
Anesthesia: General

## 2022-12-30 MED ORDER — AMISULPRIDE (ANTIEMETIC) 5 MG/2ML IV SOLN
10.0000 mg | Freq: Once | INTRAVENOUS | Status: AC | PRN
  Administered 2022-12-30: 10 mg via INTRAVENOUS

## 2022-12-30 MED ORDER — ENOXAPARIN SODIUM 40 MG/0.4ML IJ SOSY: 40.0000 mg | PREFILLED_SYRINGE | INTRAMUSCULAR | Status: DC

## 2022-12-30 MED ORDER — LIDOCAINE HCL (PF) 2 % IJ SOLN
INTRAMUSCULAR | Status: AC
  Filled 2022-12-30: qty 5

## 2022-12-30 MED ORDER — ONDANSETRON HCL 4 MG/2ML IJ SOLN
INTRAMUSCULAR | Status: AC
  Filled 2022-12-30: qty 2

## 2022-12-30 MED ORDER — OXYCODONE HCL 5 MG PO TABS
5.0000 mg | ORAL_TABLET | ORAL | Status: DC | PRN
  Administered 2022-12-30 – 2022-12-31 (×2): 10 mg via ORAL
  Filled 2022-12-30 (×2): qty 2

## 2022-12-30 MED ORDER — ACETAMINOPHEN 500 MG PO TABS
1000.0000 mg | ORAL_TABLET | ORAL | Status: AC
Start: 2022-12-30 — End: 2022-12-30
  Administered 2022-12-30: 1000 mg via ORAL

## 2022-12-30 MED ORDER — OXYCODONE HCL 5 MG PO TABS
5.0000 mg | ORAL_TABLET | Freq: Once | ORAL | Status: AC | PRN
  Administered 2022-12-30: 5 mg via ORAL

## 2022-12-30 MED ORDER — SUGAMMADEX SODIUM 200 MG/2ML IV SOLN
INTRAVENOUS | Status: DC | PRN
  Administered 2022-12-30: 200 mg via INTRAVENOUS

## 2022-12-30 MED ORDER — PROCHLORPERAZINE MALEATE 10 MG PO TABS: 10.0000 mg | ORAL_TABLET | Freq: Four times a day (QID) | ORAL | Status: DC | PRN

## 2022-12-30 MED ORDER — CHLORHEXIDINE GLUCONATE CLOTH 2 % EX PADS: 6.0000 | MEDICATED_PAD | Freq: Once | CUTANEOUS | Status: DC

## 2022-12-30 MED ORDER — ENSURE PRE-SURGERY PO LIQD
296.0000 mL | Freq: Once | ORAL | Status: DC
  Filled 2022-12-30: qty 296

## 2022-12-30 MED ORDER — LIDOCAINE HCL (CARDIAC) PF 100 MG/5ML IV SOSY
PREFILLED_SYRINGE | INTRAVENOUS | Status: DC | PRN
  Administered 2022-12-30: 60 mg via INTRAVENOUS

## 2022-12-30 MED ORDER — PROPOFOL 1000 MG/100ML IV EMUL
INTRAVENOUS | Status: AC
  Filled 2022-12-30: qty 100

## 2022-12-30 MED ORDER — HYDROMORPHONE HCL 1 MG/ML IJ SOLN
1.0000 mg | INTRAMUSCULAR | Status: DC | PRN
  Administered 2022-12-30 (×2): 1 mg via INTRAVENOUS
  Filled 2022-12-30 (×3): qty 1

## 2022-12-30 MED ORDER — ORAL CARE MOUTH RINSE: 15.0000 mL | Freq: Once | OROMUCOSAL | Status: AC

## 2022-12-30 MED ORDER — BUPIVACAINE HCL (PF) 0.5 % IJ SOLN
INTRAMUSCULAR | Status: AC
  Filled 2022-12-30: qty 30

## 2022-12-30 MED ORDER — POTASSIUM CL IN DEXTROSE 5% 20 MEQ/L IV SOLN
20.0000 meq | INTRAVENOUS | Status: DC
  Administered 2022-12-30: 20 meq via INTRAVENOUS
  Filled 2022-12-30 (×2): qty 1000

## 2022-12-30 MED ORDER — FENTANYL CITRATE PF 50 MCG/ML IJ SOSY
PREFILLED_SYRINGE | INTRAMUSCULAR | Status: AC
  Filled 2022-12-30: qty 2

## 2022-12-30 MED ORDER — LORAZEPAM 1 MG PO TABS: 1.0000 mg | ORAL_TABLET | ORAL | Status: DC | PRN

## 2022-12-30 MED ORDER — FENTANYL CITRATE PF 50 MCG/ML IJ SOSY
25.0000 ug | PREFILLED_SYRINGE | INTRAMUSCULAR | Status: DC | PRN
  Administered 2022-12-30 (×5): 50 ug via INTRAVENOUS

## 2022-12-30 MED ORDER — ACETAMINOPHEN 500 MG PO TABS: 1000.0000 mg | ORAL_TABLET | Freq: Once | ORAL | Status: DC

## 2022-12-30 MED ORDER — AMISULPRIDE (ANTIEMETIC) 5 MG/2ML IV SOLN
INTRAVENOUS | Status: AC
  Filled 2022-12-30: qty 2

## 2022-12-30 MED ORDER — LACTATED RINGERS IV SOLN: INTRAVENOUS | Status: DC

## 2022-12-30 MED ORDER — CHLORHEXIDINE GLUCONATE 0.12 % MT SOLN
15.0000 mL | Freq: Once | OROMUCOSAL | Status: AC
Start: 2022-12-30 — End: 2022-12-30
  Administered 2022-12-30: 15 mL via OROMUCOSAL

## 2022-12-30 MED ORDER — OXYCODONE HCL 5 MG/5ML PO SOLN: 5.0000 mg | Freq: Once | ORAL | Status: AC | PRN

## 2022-12-30 MED ORDER — DEXAMETHASONE SODIUM PHOSPHATE 10 MG/ML IJ SOLN
INTRAMUSCULAR | Status: AC
Start: 2022-12-30 — End: ?
  Filled 2022-12-30: qty 1

## 2022-12-30 MED ORDER — CEFAZOLIN SODIUM-DEXTROSE 2-4 GM/100ML-% IV SOLN
2.0000 g | INTRAVENOUS | Status: AC
Start: 2022-12-30 — End: 2022-12-30
  Administered 2022-12-30: 2 mg via INTRAVENOUS

## 2022-12-30 MED ORDER — ACETAMINOPHEN 500 MG PO TABS
ORAL_TABLET | ORAL | Status: AC
  Filled 2022-12-30: qty 2

## 2022-12-30 MED ORDER — OXYCODONE HCL 5 MG PO TABS
ORAL_TABLET | ORAL | Status: AC
  Filled 2022-12-30: qty 1

## 2022-12-30 MED ORDER — FENTANYL CITRATE PF 50 MCG/ML IJ SOSY
PREFILLED_SYRINGE | INTRAMUSCULAR | Status: AC
  Filled 2022-12-30: qty 1

## 2022-12-30 MED ORDER — METOCLOPRAMIDE HCL 5 MG/ML IJ SOLN
10.0000 mg | Freq: Three times a day (TID) | INTRAMUSCULAR | Status: DC
  Administered 2022-12-30 – 2022-12-31 (×2): 10 mg via INTRAVENOUS
  Filled 2022-12-30 (×2): qty 2

## 2022-12-30 MED ORDER — FENTANYL CITRATE (PF) 100 MCG/2ML IJ SOLN
INTRAMUSCULAR | Status: AC
  Filled 2022-12-30: qty 2

## 2022-12-30 MED ORDER — CEFAZOLIN SODIUM-DEXTROSE 2-4 GM/100ML-% IV SOLN
INTRAVENOUS | Status: AC
  Filled 2022-12-30: qty 100

## 2022-12-30 MED ORDER — FENTANYL CITRATE (PF) 100 MCG/2ML IJ SOLN
INTRAMUSCULAR | Status: DC | PRN
  Administered 2022-12-30: 50 ug via INTRAVENOUS

## 2022-12-30 MED ORDER — BUPIVACAINE HCL (PF) 0.5 % IJ SOLN
INTRAMUSCULAR | Status: DC | PRN
  Administered 2022-12-30: 30 mL

## 2022-12-30 MED ORDER — PROCHLORPERAZINE EDISYLATE 10 MG/2ML IJ SOLN
5.0000 mg | Freq: Four times a day (QID) | INTRAMUSCULAR | Status: DC | PRN
  Administered 2022-12-30: 10 mg via INTRAVENOUS
  Filled 2022-12-30: qty 2

## 2022-12-30 MED ORDER — PROPOFOL 10 MG/ML IV BOLUS
INTRAVENOUS | Status: DC | PRN
  Administered 2022-12-30: 140 mg via INTRAVENOUS

## 2022-12-30 MED ORDER — LACTATED RINGERS IR SOLN
Status: DC | PRN
  Administered 2022-12-30: 1000 mL

## 2022-12-30 MED ORDER — BUPIVACAINE-EPINEPHRINE 0.25% -1:200000 IJ SOLN
INTRAMUSCULAR | Status: AC
Start: 2022-12-30 — End: ?
  Filled 2022-12-30: qty 1

## 2022-12-30 MED ORDER — ROCURONIUM BROMIDE 100 MG/10ML IV SOLN
INTRAVENOUS | Status: DC | PRN
  Administered 2022-12-30: 50 mg via INTRAVENOUS

## 2022-12-30 MED ORDER — ACETAMINOPHEN 500 MG PO TABS
1000.0000 mg | ORAL_TABLET | Freq: Four times a day (QID) | ORAL | Status: DC
  Administered 2022-12-30 – 2022-12-31 (×3): 1000 mg via ORAL
  Filled 2022-12-30 (×3): qty 2

## 2022-12-30 MED ORDER — SODIUM CHLORIDE 0.9 % IV SOLN
12.5000 mg | Freq: Four times a day (QID) | INTRAVENOUS | Status: DC | PRN
  Administered 2022-12-30: 12.5 mg via INTRAVENOUS
  Filled 2022-12-30: qty 12.5

## 2022-12-30 MED ORDER — FENTANYL CITRATE PF 50 MCG/ML IJ SOSY: 50.0000 ug | PREFILLED_SYRINGE | INTRAMUSCULAR | Status: AC

## 2022-12-30 MED ORDER — TRAMADOL HCL 50 MG PO TABS: 50.0000 mg | ORAL_TABLET | Freq: Four times a day (QID) | ORAL | Status: DC | PRN

## 2022-12-30 MED ORDER — PROPOFOL 500 MG/50ML IV EMUL
INTRAVENOUS | Status: DC | PRN
  Administered 2022-12-30: 125 ug/kg/min via INTRAVENOUS

## 2022-12-30 MED ORDER — PROPOFOL 10 MG/ML IV BOLUS
INTRAVENOUS | Status: AC
  Filled 2022-12-30: qty 20

## 2022-12-30 MED ORDER — METHOCARBAMOL 500 MG PO TABS
500.0000 mg | ORAL_TABLET | Freq: Four times a day (QID) | ORAL | Status: DC | PRN
  Administered 2022-12-30 – 2022-12-31 (×2): 500 mg via ORAL
  Filled 2022-12-30 (×2): qty 1

## 2022-12-30 SURGICAL SUPPLY — 30 items
APPLIER CLIP 5 13 M/L LIGAMAX5 (MISCELLANEOUS) ×1
BAG COUNTER SPONGE SURGICOUNT (BAG) IMPLANT
CABLE HIGH FREQUENCY MONO STRZ (ELECTRODE) ×1 IMPLANT
CHLORAPREP W/TINT 26 (MISCELLANEOUS) ×1 IMPLANT
CLIP APPLIE 5 13 M/L LIGAMAX5 (MISCELLANEOUS) ×1 IMPLANT
COVER MAYO STAND XLG (MISCELLANEOUS) ×1 IMPLANT
DERMABOND ADVANCED .7 DNX12 (GAUZE/BANDAGES/DRESSINGS) ×1 IMPLANT
DRAPE C-ARM 42X120 X-RAY (DRAPES) IMPLANT
ELECT REM PT RETURN 15FT ADLT (MISCELLANEOUS) ×1 IMPLANT
GLOVE BIO SURGEON STRL SZ7.5 (GLOVE) ×1 IMPLANT
GOWN STRL REUS W/ TWL XL LVL3 (GOWN DISPOSABLE) ×1 IMPLANT
HEMOSTAT SURGICEL 4X8 (HEMOSTASIS) IMPLANT
IRRIG SUCT STRYKERFLOW 2 WTIP (MISCELLANEOUS) ×1
IRRIGATION SUCT STRKRFLW 2 WTP (MISCELLANEOUS) ×1 IMPLANT
KIT BASIN OR (CUSTOM PROCEDURE TRAY) ×1 IMPLANT
KIT TURNOVER KIT A (KITS) IMPLANT
PENCIL SMOKE EVACUATOR (MISCELLANEOUS) IMPLANT
SCISSORS LAP 5X35 DISP (ENDOMECHANICALS) ×1 IMPLANT
SET CHOLANGIOGRAPH MIX (MISCELLANEOUS) IMPLANT
SET TUBE SMOKE EVAC HIGH FLOW (TUBING) ×1 IMPLANT
SLEEVE Z-THREAD 5X100MM (TROCAR) ×2 IMPLANT
SPIKE FLUID TRANSFER (MISCELLANEOUS) ×1 IMPLANT
SUT MNCRL AB 4-0 PS2 18 (SUTURE) ×1 IMPLANT
SYS BAG RETRIEVAL 10MM (BASKET) ×1
SYSTEM BAG RETRIEVAL 10MM (BASKET) ×1 IMPLANT
TOWEL OR 17X26 10 PK STRL BLUE (TOWEL DISPOSABLE) ×1 IMPLANT
TOWEL OR NON WOVEN STRL DISP B (DISPOSABLE) ×1 IMPLANT
TRAY LAPAROSCOPIC (CUSTOM PROCEDURE TRAY) ×1 IMPLANT
TROCAR BALLN 12MMX100 BLUNT (TROCAR) ×1 IMPLANT
TROCAR Z-THREAD OPTICAL 5X100M (TROCAR) ×1 IMPLANT

## 2022-12-30 NOTE — Anesthesia Preprocedure Evaluation (Addendum)
Anesthesia Evaluation  Patient identified by MRN, date of birth, ID band Patient awake    Reviewed: Allergy & Precautions, NPO status , Patient's Chart, lab work & pertinent test results  History of Anesthesia Complications (+) PONV and history of anesthetic complications  Airway Mallampati: III  TM Distance: >3 FB Neck ROM: Full    Dental  (+) Dental Advisory Given, Teeth Intact   Pulmonary neg pulmonary ROS   Pulmonary exam normal        Cardiovascular negative cardio ROS Normal cardiovascular exam     Neuro/Psych negative neurological ROS  negative psych ROS   GI/Hepatic Neg liver ROS,GERD  Medicated and Controlled,,  Endo/Other  negative endocrine ROS    Renal/GU CRFRenal disease     Musculoskeletal  (+) Arthritis ,    Abdominal   Peds  Hematology  Plt 136k NHL Splenomegaly    Anesthesia Other Findings Raynaud's phenomena   Reproductive/Obstetrics                              Anesthesia Physical Anesthesia Plan  ASA: 3  Anesthesia Plan: General   Post-op Pain Management: Tylenol PO (pre-op)*   Induction: Intravenous  PONV Risk Score and Plan: 4 or greater and Treatment may vary due to age or medical condition and TIVA  Airway Management Planned: Oral ETT and Video Laryngoscope Planned  Additional Equipment: None  Intra-op Plan:   Post-operative Plan: Extubation in OR  Informed Consent: I have reviewed the patients History and Physical, chart, labs and discussed the procedure including the risks, benefits and alternatives for the proposed anesthesia with the patient or authorized representative who has indicated his/her understanding and acceptance.     Dental advisory given  Plan Discussed with: CRNA and Anesthesiologist  Anesthesia Plan Comments:         Anesthesia Quick Evaluation

## 2022-12-30 NOTE — Interval H&P Note (Signed)
History and Physical Interval Note:no change in H and P  12/30/2022 1:00 PM  Shannon Barajas  has presented today for surgery, with the diagnosis of BILIARY DYSKINESIA.  The various methods of treatment have been discussed with the patient and family. After consideration of risks, benefits and other options for treatment, the patient has consented to  Procedure(s): LAPAROSCOPIC CHOLECYSTECTOMY (N/A) as a surgical intervention.  The patient's history has been reviewed, patient examined, no change in status, stable for surgery.  I have reviewed the patient's chart and labs.  Questions were answered to the patient's satisfaction.     Abigail Miyamoto

## 2022-12-30 NOTE — Progress Notes (Signed)
Patient experiencing pain around 1915. Given PRNs.   Around 2000, patient c/o nausea to which she had experienced at 1700 also.   Paged [facility] on call for General Surgery at 2022, new order for Reglan at 2035. Patient explains that "that doesn't really work. I take that at home." Educated patient that the order is for her to take Reglan every 8 hours as scheduled per IV.   Patient allowed Reglan to be given at 2115.   At 2128, daughter of patient calls this RN in and states she had vomited. Appearance is bile and 50cc in amount. She states she needs more nausea medication and daughter expressed concern for her taking a lot of medication.   Patient explains that she would need something else, [facility] on-call MD paged at 2156.   Verbal orders in for pt, after speaking with On-call CCS MD at 2206 for PRN Ativan and PRN Phenergan.

## 2022-12-30 NOTE — Transfer of Care (Signed)
Immediate Anesthesia Transfer of Care Note  Patient: Shannon Barajas  Procedure(s) Performed: LAPAROSCOPIC CHOLECYSTECTOMY  Patient Location: PACU  Anesthesia Type:General  Level of Consciousness: awake, alert , and oriented  Airway & Oxygen Therapy: Patient Spontanous Breathing and Patient connected to face mask oxygen  Post-op Assessment: Report given to RN and Post -op Vital signs reviewed and stable  Post vital signs: Reviewed and stable  Last Vitals:  Vitals Value Taken Time  BP 124/64 12/30/22 1417  Temp    Pulse 60 12/30/22 1423  Resp 12 12/30/22 1423  SpO2 100 % 12/30/22 1423  Vitals shown include unfiled device data.  Last Pain:  Vitals:   12/30/22 1139  TempSrc:   PainSc: 0-No pain         Complications:  Encounter Notable Events  Notable Event Outcome Phase Comment  Difficult to intubate - unexpected  Intraprocedure Filed from anesthesia note documentation.

## 2022-12-30 NOTE — Op Note (Signed)
Laparoscopic Cholecystectomy Procedure Note  Indications: This patient presents with symptomatic gallbladder disease and will undergo laparoscopic cholecystectomy.  Pre-operative Diagnosis: biliary dyskinesia  Post-operative Diagnosis: same  Surgeon: Abigail Miyamoto   Assistants: none  Anesthesia: General endotracheal anesthesia  ASA Class: 2  Procedure Details  The patient was seen again in the Holding Room. The risks, benefits, complications, treatment options, and expected outcomes were discussed with the patient. The possibilities of reaction to medication, pulmonary aspiration, perforation of viscus, bleeding, recurrent infection, finding a normal gallbladder, the need for additional procedures, failure to diagnose a condition, the possible need to convert to an open procedure, and creating a complication requiring transfusion or operation were discussed with the patient. The likelihood of improving the patient's symptoms with return to their baseline status is good.  The patient and/or family concurred with the proposed plan, giving informed consent. The site of surgery properly noted. The patient was taken to Operating Room, identified as Shannon Barajas and the procedure verified as Laparoscopic Cholecystectomy with Intraoperative Cholangiogram. A Time Out was held and the above information confirmed.  Prior to the induction of general anesthesia, antibiotic prophylaxis was administered. General endotracheal anesthesia was then administered and tolerated well. After the induction, the abdomen was prepped with Chloraprep and draped in sterile fashion. The patient was positioned in the supine position.  Local anesthetic agent was injected into the skin near the umbilicus and an incision made. We dissected down to the abdominal fascia with blunt dissection.  The fascia was incised vertically and we entered the peritoneal cavity bluntly.  A pursestring suture of 0-Vicryl was placed around  the fascial opening.  The Hasson cannula was inserted and secured with the stay suture.  Pneumoperitoneum was then created with CO2 and tolerated well without any adverse changes in the patient's vital signs. An 5-mm port was placed in the subxiphoid position.  Two 5-mm ports were placed in the right upper quadrant. All skin incisions were infiltrated with a local anesthetic agent before making the incision and placing the trocars.   We positioned the patient in reverse Trendelenburg, tilted slightly to the patient's left.  The gallbladder was identified and found to be floppy and thick walled consistent with chronic cholecystitis. The fundus grasped and retracted cephalad. Adhesions were lysed bluntly and with the electrocautery where indicated, taking care not to injure any adjacent organs or viscus. The infundibulum was grasped and retracted laterally, exposing the peritoneum overlying the triangle of Calot. This was then divided and exposed in a blunt fashion. The cystic duct was clearly identified and bluntly dissected circumferentially. A critical view of the cystic duct and cystic artery was obtained.  The cystic duct was then ligated with clips and divided. The cystic artery was, dissected free, ligated with clips and divided as well.   The gallbladder was dissected from the liver bed in retrograde fashion with the electrocautery. The gallbladder was removed and placed in an Endocatch sac. The liver bed was irrigated and inspected. Hemostasis was achieved with the electrocautery. Copious irrigation was utilized and was repeatedly aspirated until clear.  The gallbladder and Endocatch sac were then removed through the umbilical port site.  The pursestring suture was used to close the umbilical fascia.    We again inspected the right upper quadrant for hemostasis.  Pneumoperitoneum was released as we removed the trocars.  4-0 Monocryl was used to close the skin.   Skin glue was then applied. The patient  was then extubated and brought to  the recovery room in stable condition. Instrument, sponge, and needle counts were correct at closure and at the conclusion of the case.   Findings: Chronic cholecystitis without Cholelithiasis  Estimated Blood Loss: Minimal         Drains: none         Specimens: Gallbladder           Complications: None; patient tolerated the procedure well.         Disposition: PACU - hemodynamically stable.         Condition: stable

## 2022-12-30 NOTE — Anesthesia Procedure Notes (Signed)
Procedure Name: Intubation Date/Time: 12/30/2022 1:28 PM  Performed by: Dennison Nancy, CRNAPre-anesthesia Checklist: Patient identified, Emergency Drugs available, Suction available, Patient being monitored and Timeout performed Patient Re-evaluated:Patient Re-evaluated prior to induction Oxygen Delivery Method: Circle system utilized Preoxygenation: Pre-oxygenation with 100% oxygen Induction Type: IV induction Ventilation: Mask ventilation without difficulty Laryngoscope Size: Mac, 3 and Glidescope Grade View: Grade III Tube type: Oral Tube size: 6.5 mm Number of attempts: 2 Airway Equipment and Method: Stylet Placement Confirmation: ETT inserted through vocal cords under direct vision, positive ETCO2, CO2 detector and breath sounds checked- equal and bilateral Secured at: 22 cm Tube secured with: Tape Dental Injury: Teeth and Oropharynx as per pre-operative assessment  Difficulty Due To: Difficulty was unanticipated and Difficult Airway- due to limited oral opening Comments: Unable to visualize cords with MAC 3 blade, immediately went to GlideScope with #3 Glidescope blade, successful intubation with Glidescope; patient with small mouth, limited opening

## 2022-12-31 ENCOUNTER — Encounter (HOSPITAL_COMMUNITY): Payer: Self-pay | Admitting: Surgery

## 2022-12-31 DIAGNOSIS — K828 Other specified diseases of gallbladder: Secondary | ICD-10-CM | POA: Diagnosis not present

## 2022-12-31 LAB — SURGICAL PATHOLOGY

## 2022-12-31 MED ORDER — PROMETHAZINE HCL 12.5 MG PO TABS: 12.5000 mg | ORAL_TABLET | Freq: Four times a day (QID) | ORAL | 0 refills | Status: DC | PRN

## 2022-12-31 MED ORDER — OXYCODONE HCL 5 MG PO TABS: 5.0000 mg | ORAL_TABLET | Freq: Four times a day (QID) | ORAL | 0 refills | Status: DC | PRN

## 2022-12-31 NOTE — Anesthesia Postprocedure Evaluation (Signed)
Anesthesia Post Note  Patient: Shannon Barajas  Procedure(s) Performed: LAPAROSCOPIC CHOLECYSTECTOMY     Patient location during evaluation: PACU Anesthesia Type: General Level of consciousness: awake and alert Pain management: pain level controlled Vital Signs Assessment: post-procedure vital signs reviewed and stable Respiratory status: spontaneous breathing, nonlabored ventilation, respiratory function stable and patient connected to nasal cannula oxygen Cardiovascular status: blood pressure returned to baseline and stable Postop Assessment: no apparent nausea or vomiting Anesthetic complications: yes   Encounter Notable Events  Notable Event Outcome Phase Comment  Difficult to intubate - unexpected  Intraprocedure Filed from anesthesia note documentation.    Last Vitals:  Vitals:   12/31/22 0142 12/31/22 0622  BP: (!) 127/59 (!) 109/50  Pulse: 71 62  Resp: 16 14  Temp: 36.7 C 36.6 C  SpO2: 100% 98%    Last Pain:  Vitals:   12/31/22 0622  TempSrc: Oral  PainSc:                  Beryle Lathe

## 2022-12-31 NOTE — Progress Notes (Signed)
 Reviewed d/c instructions with pt. All questions answered. Pt taken to front entrance via wheelchair to meet ride.

## 2022-12-31 NOTE — Discharge Summary (Signed)
Physician Discharge Summary  Patient ID: Shannon Barajas MRN: [id number] DOB/AGE: 04/08/1950 72 y.o.  Admit date: 12/30/2022 Discharge date: 12/31/2022  Admission Diagnoses:  Discharge Diagnoses:  Principal Problem:   S/P laparoscopic cholecystectomy Biliary dyskinesia  Discharged Condition: good  Hospital Course: uneventful post op recovery other than nausea and small episode of emesis.  On the morning of POD#1 she was comfortable and wanted to go home so discharged home  Consults: None  Significant Diagnostic Studies:   Treatments: surgery: laparoscopic cholecystectomy  Discharge Exam: Blood pressure (!) 109/50, pulse 62, temperature 97.9 F (36.6 C), temperature source Oral, resp. rate 14, height 5\' 7"  (1.702 m), weight 62.6 kg, SpO2 98%. General appearance: alert, cooperative, and no distress Resp: clear to auscultation bilaterally Cardio: regular rate and rhythm, S1, S2 normal, no murmur, click, rub or gallop Incision/Wound: abdomen soft, incisions clean, non-distended  Disposition: Discharge disposition: 01-Home or Self Care        Allergies as of 12/31/2022       Reactions   Biaxin [clarithromycin] Other (See Comments)   REACTION: aggrevates IBS   Erythromycin    REACTION: aggrevates IBS   Soy Allergy (do Not Select) Other (See Comments)   May arregevate interstitial cystitis   Zithromax [azithromycin]    Upset stomach   Zofran [ondansetron Hcl]    Daughter had anaphlaxis from, patient does not take        Medication List     TAKE these medications    ascorbic acid 500 MG tablet Commonly known as: VITAMIN C Take 500 mg by mouth daily.   COLLAGEN PO Take 1 capsule by mouth in the morning and at bedtime.   dicyclomine 20 MG tablet Commonly known as: BENTYL TAKE 1 TABLET BY MOUTH TWICE DAILY AS NEEDED FOR  SPASMS  (  ABDOMINAL  PAIN)   estradiol 1 MG tablet Commonly known as: ESTRACE Take 1 mg by mouth at bedtime.   famotidine 20 MG  tablet Commonly known as: PEPCID TAKE 1 TABLET BY MOUTH AT BEDTIME   HYDROcodone-acetaminophen 5-325 MG tablet Commonly known as: NORCO/VICODIN Take 1 tablet by mouth every 6 (six) hours as needed for moderate pain (pain score 4-6).   linaclotide 290 MCG Caps capsule Commonly known as: Linzess Take 1 capsule (290 mcg total) by mouth every morning.   metoCLOPramide 5 MG tablet Commonly known as: REGLAN TAKE 1 TABLET BY MOUTH 2 (TWO) TIMES DAILY AT 10 AM AND 4 PM. What changed: See the new instructions.   multivitamin with minerals Tabs tablet Take 1 tablet by mouth daily.   oxyCODONE 5 MG immediate release tablet Commonly known as: Oxy IR/ROXICODONE Take 1 tablet (5 mg total) by mouth every 6 (six) hours as needed for moderate pain (pain score 4-6), severe pain (pain score 7-10) or breakthrough pain.   pantoprazole 40 MG tablet Commonly known as: PROTONIX Take 1 tablet by mouth once daily   PROBIOTIC GUMMIES PO Take 2 each by mouth daily.   progesterone 100 MG capsule Commonly known as: PROMETRIUM Take 100 mg by mouth at bedtime.   promethazine 12.5 MG tablet Commonly known as: PHENERGAN Take 1 tablet (12.5 mg total) by mouth every 6 (six) hours as needed for nausea or vomiting.   temazepam 30 MG capsule Commonly known as: RESTORIL Take 2 capsules (60 mg total) by mouth at bedtime.   thiamine 50 MG tablet Commonly known as: VITAMIN B-1 Take 50 mg by mouth daily.   Vitamin D 50 MCG (2000  UT) tablet Take 2,000 Units by mouth daily.        Follow-up Information     Abigail Miyamoto, MD. Schedule an appointment as soon as possible for a visit in 4 week(s).   Specialty: General Surgery Contact information: [address] [place] [address] [phone]                 Signed: Abigail Miyamoto 12/31/2022, 6:38 AM

## 2022-12-31 NOTE — Discharge Instructions (Signed)
[facility] ______[facility] LAPAROSCOPIC SURGERY: POST OP INSTRUCTIONS Always review your discharge instruction sheet given to you by the facility where your surgery was performed. IF YOU HAVE DISABILITY OR FAMILY LEAVE FORMS, YOU MUST BRING THEM TO THE OFFICE FOR PROCESSING.   DO NOT GIVE THEM TO YOUR DOCTOR.  A prescription for pain medication may be given to you upon discharge.  Take your pain medication as prescribed, if needed.  If narcotic pain medicine is not needed, then you may take acetaminophen (Tylenol) or ibuprofen (Advil) as needed. Take your usually prescribed medications unless otherwise directed. If you need a refill on your pain medication, please contact your pharmacy.  They will contact our office to request authorization. Prescriptions will not be filled after 5pm or on week-ends. You should follow a light diet the first few days after arrival home, such as soup and crackers, etc.  Be sure to include lots of fluids daily. Most patients will experience some swelling and bruising in the area of the incisions.  Ice packs will help.  Swelling and bruising can take several days to resolve.  It is common to experience some constipation if taking pain medication after surgery.  Increasing fluid intake and taking a stool softener (such as Colace) will usually help or prevent this problem from occurring.  A mild laxative (Milk of Magnesia or Miralax) should be taken according to package instructions if there are no bowel movements after 48 hours. Unless discharge instructions indicate otherwise, you may remove your bandages 24-48 hours after surgery, and you may shower at that time.  You may have steri-strips (small skin tapes) in place directly over the incision.  These strips should be left on the skin for 7-10 days.  If your surgeon used skin glue on the incision, you may shower in 24 hours.  The glue will flake off over the next 2-3 weeks.  Any sutures or staples will be  removed at the office during your follow-up visit. ACTIVITIES:  You may resume regular (light) daily activities beginning the next day--such as daily self-care, walking, climbing stairs--gradually increasing activities as tolerated.  You may have sexual intercourse when it is comfortable.  Refrain from any heavy lifting or straining until approved by your doctor. You may drive when you are no longer taking prescription pain medication, you can comfortably wear a seatbelt, and you can safely maneuver your car and apply brakes. RETURN TO WORK:  __________________________________________________________ Bonita Quin should see your doctor in the office for a follow-up appointment approximately 2-3 weeks after your surgery.  Make sure that you call for this appointment within a day or two after you arrive home to insure a convenient appointment time. OTHER INSTRUCTIONS: YOU MAY SHOWER STARTING TODAY ICE PACK, TYLENOL, AND IBUPROFEN ALSO FOR PAIN NO LIFTING MORE THAN 15 POUNDS FOR 2 WEEKS __________________________________________________________________________________________________________________________ __________________________________________________________________________________________________________________________ WHEN TO CALL YOUR DOCTOR: Fever over 101.0 Inability to urinate Continued bleeding from incision. Increased pain, redness, or drainage from the incision. Increasing abdominal pain  The clinic staff is available to answer your questions during regular business hours.  Please don't hesitate to call and ask to speak to one of the nurses for clinical concerns.  If you have a medical emergency, go to the nearest emergency room or call 911.  A surgeon from [facility] is always on call at the hospital. [address], [address], [place], [place]  [address] ? [address] [address], [place], [place]   [address] [phone] ? [phone] ?  FAX [phone] Web site:  [email or web address]

## 2023-01-18 ENCOUNTER — Other Ambulatory Visit: Payer: Self-pay | Admitting: Internal Medicine

## 2023-01-22 DIAGNOSIS — J189 Pneumonia, unspecified organism: Secondary | ICD-10-CM | POA: Diagnosis not present

## 2023-01-22 DIAGNOSIS — R5383 Other fatigue: Secondary | ICD-10-CM | POA: Diagnosis not present

## 2023-01-22 DIAGNOSIS — R0981 Nasal congestion: Secondary | ICD-10-CM | POA: Diagnosis not present

## 2023-01-22 DIAGNOSIS — Z9049 Acquired absence of other specified parts of digestive tract: Secondary | ICD-10-CM | POA: Diagnosis not present

## 2023-01-22 DIAGNOSIS — Z1152 Encounter for screening for COVID-19: Secondary | ICD-10-CM | POA: Diagnosis not present

## 2023-01-22 DIAGNOSIS — H9203 Otalgia, bilateral: Secondary | ICD-10-CM | POA: Diagnosis not present

## 2023-01-22 DIAGNOSIS — R0602 Shortness of breath: Secondary | ICD-10-CM | POA: Diagnosis not present

## 2023-01-22 DIAGNOSIS — R051 Acute cough: Secondary | ICD-10-CM | POA: Diagnosis not present

## 2023-02-01 ENCOUNTER — Ambulatory Visit: Payer: PPO | Admitting: Internal Medicine

## 2023-02-01 DIAGNOSIS — Z1152 Encounter for screening for COVID-19: Secondary | ICD-10-CM | POA: Diagnosis not present

## 2023-02-01 DIAGNOSIS — R051 Acute cough: Secondary | ICD-10-CM | POA: Diagnosis not present

## 2023-02-01 DIAGNOSIS — R52 Pain, unspecified: Secondary | ICD-10-CM | POA: Diagnosis not present

## 2023-02-01 DIAGNOSIS — J189 Pneumonia, unspecified organism: Secondary | ICD-10-CM | POA: Diagnosis not present

## 2023-02-01 DIAGNOSIS — R0981 Nasal congestion: Secondary | ICD-10-CM | POA: Diagnosis not present

## 2023-02-01 DIAGNOSIS — J40 Bronchitis, not specified as acute or chronic: Secondary | ICD-10-CM | POA: Diagnosis not present

## 2023-02-01 DIAGNOSIS — H9203 Otalgia, bilateral: Secondary | ICD-10-CM | POA: Diagnosis not present

## 2023-02-01 DIAGNOSIS — Z9049 Acquired absence of other specified parts of digestive tract: Secondary | ICD-10-CM | POA: Diagnosis not present

## 2023-02-15 ENCOUNTER — Other Ambulatory Visit: Payer: Self-pay | Admitting: Physician Assistant

## 2023-02-18 ENCOUNTER — Other Ambulatory Visit: Payer: Self-pay | Admitting: Physician Assistant

## 2023-02-18 DIAGNOSIS — H6121 Impacted cerumen, right ear: Secondary | ICD-10-CM | POA: Diagnosis not present

## 2023-02-18 DIAGNOSIS — H60501 Unspecified acute noninfective otitis externa, right ear: Secondary | ICD-10-CM | POA: Diagnosis not present

## 2023-02-18 DIAGNOSIS — H9201 Otalgia, right ear: Secondary | ICD-10-CM | POA: Diagnosis not present

## 2023-02-24 ENCOUNTER — Telehealth: Payer: Self-pay | Admitting: Physician Assistant

## 2023-02-24 MED ORDER — FAMOTIDINE 20 MG PO TABS: 20.0000 mg | ORAL_TABLET | Freq: Every day | ORAL | 2 refills | Status: DC

## 2023-02-24 NOTE — Telephone Encounter (Signed)
Inbound call from patient stating she needs a refill for Pepcid medication. Please advise, thank you.

## 2023-02-24 NOTE — Telephone Encounter (Signed)
 Sent script to pharmacy.

## 2023-03-04 DIAGNOSIS — Z79899 Other long term (current) drug therapy: Secondary | ICD-10-CM | POA: Diagnosis not present

## 2023-03-04 DIAGNOSIS — R7989 Other specified abnormal findings of blood chemistry: Secondary | ICD-10-CM | POA: Diagnosis not present

## 2023-03-04 DIAGNOSIS — M8580 Other specified disorders of bone density and structure, unspecified site: Secondary | ICD-10-CM | POA: Diagnosis not present

## 2023-04-06 ENCOUNTER — Other Ambulatory Visit: Payer: Self-pay | Admitting: Internal Medicine

## 2023-04-06 DIAGNOSIS — Z1339 Encounter for screening examination for other mental health and behavioral disorders: Secondary | ICD-10-CM | POA: Diagnosis not present

## 2023-04-06 DIAGNOSIS — Z8701 Personal history of pneumonia (recurrent): Secondary | ICD-10-CM | POA: Diagnosis not present

## 2023-04-06 DIAGNOSIS — N1832 Chronic kidney disease, stage 3b: Secondary | ICD-10-CM | POA: Diagnosis not present

## 2023-04-06 DIAGNOSIS — R1084 Generalized abdominal pain: Secondary | ICD-10-CM | POA: Diagnosis not present

## 2023-04-06 DIAGNOSIS — C8590 Non-Hodgkin lymphoma, unspecified, unspecified site: Secondary | ICD-10-CM | POA: Diagnosis not present

## 2023-04-06 DIAGNOSIS — R142 Eructation: Secondary | ICD-10-CM | POA: Diagnosis not present

## 2023-04-06 DIAGNOSIS — R140 Abdominal distension (gaseous): Secondary | ICD-10-CM | POA: Diagnosis not present

## 2023-04-06 DIAGNOSIS — K222 Esophageal obstruction: Secondary | ICD-10-CM | POA: Diagnosis not present

## 2023-04-06 DIAGNOSIS — J069 Acute upper respiratory infection, unspecified: Secondary | ICD-10-CM | POA: Diagnosis not present

## 2023-04-06 DIAGNOSIS — Z0000 Encounter for general adult medical examination without abnormal findings: Secondary | ICD-10-CM | POA: Diagnosis not present

## 2023-04-06 DIAGNOSIS — D696 Thrombocytopenia, unspecified: Secondary | ICD-10-CM | POA: Diagnosis not present

## 2023-04-06 DIAGNOSIS — Z1331 Encounter for screening for depression: Secondary | ICD-10-CM | POA: Diagnosis not present

## 2023-04-08 ENCOUNTER — Other Ambulatory Visit: Payer: Self-pay

## 2023-04-08 ENCOUNTER — Telehealth: Payer: Self-pay | Admitting: Physician Assistant

## 2023-04-08 MED ORDER — PANTOPRAZOLE SODIUM 40 MG PO TBEC: 40.0000 mg | DELAYED_RELEASE_TABLET | Freq: Two times a day (BID) | ORAL | 2 refills | Status: DC

## 2023-04-08 MED ORDER — DICYCLOMINE HCL 20 MG PO TABS: 20.0000 mg | ORAL_TABLET | Freq: Three times a day (TID) | ORAL | 0 refills | Status: DC

## 2023-04-08 NOTE — Telephone Encounter (Signed)
 She should try to take the dicyclomine 4 times a day.  We can refill that if needed.  I cannot tell how often she is taking it.  She can also double up on the pantoprazole to twice daily and we can send a prescription for that.  It is very possible situational stress is triggering this.  May not be anything but time to allow that to improve.

## 2023-04-08 NOTE — Telephone Encounter (Signed)
 Patient is agreeable to this plan of care. Pharmacy confirmed. Medications refills transmitted.

## 2023-04-08 NOTE — Telephone Encounter (Signed)
 Patient called and stated that she was having a lot of stomach issue,patient did not want to elaborate more on stomach issues. Patient is requesting a call back. Please advise.

## 2023-04-08 NOTE — Telephone Encounter (Signed)
 Patient tells me she has been dealing with "out of control" GI symptoms of "constant burping, bloating and stomach pain" for several weeks. Her mother passed away last week and admits to feeling additional stress.  She reports she takes Tylenol, Gas-X and and anti-spasmodic and "nothing helps." She is having daily bowel movements with the use of Linzess 290 mcg. She confirms she also take Reglan, pantoprazole and famotidine.  Patient is upset that she cannot get an appointment with Dr Leone Payor until May. She formerly saw Dr Christella Hartigan. Patient also says her PCP suggested she may have SIBO, but testing was not offered. Patient is unable to come to an appointment next week as she and her family bury her mother and re-group as a family. 7 day hold appointment 04/19/23 offered and accepted. Cannot book the appointment until 04/13/23. Do you have any suggestions for her prior to the appointment?

## 2023-04-19 ENCOUNTER — Encounter: Payer: Self-pay | Admitting: Internal Medicine

## 2023-04-19 ENCOUNTER — Ambulatory Visit: Admitting: Internal Medicine

## 2023-04-19 VITALS — BP 100/64 | HR 72 | Ht 67.0 in | Wt 139.4 lb

## 2023-04-19 DIAGNOSIS — F439 Reaction to severe stress, unspecified: Secondary | ICD-10-CM | POA: Diagnosis not present

## 2023-04-19 DIAGNOSIS — K581 Irritable bowel syndrome with constipation: Secondary | ICD-10-CM | POA: Diagnosis not present

## 2023-04-19 DIAGNOSIS — K219 Gastro-esophageal reflux disease without esophagitis: Secondary | ICD-10-CM | POA: Diagnosis not present

## 2023-04-19 DIAGNOSIS — D696 Thrombocytopenia, unspecified: Secondary | ICD-10-CM | POA: Diagnosis not present

## 2023-04-19 DIAGNOSIS — R142 Eructation: Secondary | ICD-10-CM | POA: Diagnosis not present

## 2023-04-19 DIAGNOSIS — M950 Acquired deformity of nose: Secondary | ICD-10-CM | POA: Diagnosis not present

## 2023-04-19 DIAGNOSIS — R55 Syncope and collapse: Secondary | ICD-10-CM | POA: Diagnosis not present

## 2023-04-19 DIAGNOSIS — C8590 Non-Hodgkin lymphoma, unspecified, unspecified site: Secondary | ICD-10-CM | POA: Diagnosis not present

## 2023-04-19 DIAGNOSIS — L309 Dermatitis, unspecified: Secondary | ICD-10-CM | POA: Diagnosis not present

## 2023-04-19 DIAGNOSIS — W19XXXA Unspecified fall, initial encounter: Secondary | ICD-10-CM | POA: Diagnosis not present

## 2023-04-19 DIAGNOSIS — N1832 Chronic kidney disease, stage 3b: Secondary | ICD-10-CM | POA: Diagnosis not present

## 2023-04-19 DIAGNOSIS — F4321 Adjustment disorder with depressed mood: Secondary | ICD-10-CM

## 2023-04-19 NOTE — Patient Instructions (Signed)
 We are so sorry for your loss.   Continue doubling up on your protonix and dicyclomine for another month.   We are providing you with information to read today on belching.  I appreciate the opportunity to care for you. Stan Head, MD, [facility]

## 2023-04-19 NOTE — Progress Notes (Signed)
 Shannon Barajas 72 y.o. Nov 14, 1950 [id number]  Assessment & Plan:   Encounter Diagnoses  Name Primary?   Belching Yes   Gastroesophageal reflux disease, unspecified whether esophagitis present    Irritable bowel syndrome with constipation    Situational stress    Grief   Her symptom flare is correlated with death of her mother.  She has a long history of stress-induced GI complaints and I think that is what is happening now.  I do not think any testing is needed.  In my experience SIBO causes more flatulence and bloating than it does belching.  However it could be possible.  Since she has responded to the medication changes of regular dicyclomine and twice daily pantoprazole we will continue that for about another month as I expect time between her mother's death going forward should help.  She was given handouts about gas and belching and tips to try to reduce that.  Abdominal breathing techniques could be useful as well depending upon clinical course.   Subjective:  Patient consented to use of artificial intelligence scribe which was utilized. Chief Complaint: Belching  HPI 73 year old woman with a long history of irritable bowel syndrome and GERD issues, and history of splenic B-cell lymphoma with liver involvement initially diagnosed in 2015 treated with rituximab in remission since 2017, interstitial cystitis, and chronic kidney disease.  She was previously followed by Dr. Christella Barajas and Dr. Marina Barajas prior to that, who called in recently with very frequent belching, and generalized abdominal discomfort that she had a cholecystectomy in December.  Her mother was dying and it was very stressful, funeral was last Monday.  The symptoms intensified around that time.  She experiences constant burping and abdominal bloating. She has been taking Tylenol, Gas-X, and stomach spasm pills, which have provided some relief. Her Protonix dosage was increased to two tablets a day after she called in  the other week plus recommended to take her dicyclomine 4 times a day, which has helped with her symptoms. Her symptoms have been exacerbated by stress, particularly following the recent passing of her mother.  She underwent gallbladder removal for biliary dyskinesia in December, and subsequent x-rays suggested a possible bacterial overgrowth in her stomach she says she was told by her PCP. She describes her symptoms as primarily burping rather than flatulence.  She has a history of IBS with constipation, for which she takes Linzess. She has been able to have bowel movements on her own recently, though not frequently. Her abdominal pain has been more severe than in the past, but it has been improving over time.  She experienced a passing out episode on a recent Friday night, during which she was unconscious for about twenty minutes. EMS checked her blood pressure and blood sugar, which were normal, and she did not seek further medical attention at that time. She plans to follow up with her primary care doctor today.  She is having "no symptoms".  Colonoscopy 09/08/2021-Shannon Barajas first visit - Hemorrhoids found on perianal exam. - Abnormal functional exam - see impression found on digital rectal exam. - One 5 mm polyp in the ascending colon, removed with a cold snare. Resected and retrieved. - The examined portion of the ileum was normal. - External and internal hemorrhoids. - FUNCTIONAL RECTAL EXAM - abnormal voluntary tone - gluteal contraction not so much anal sphincter, increased descent w/ simulated defeaction, suspect small rectocele also - The examination was otherwise normal on direct and retroflexion views.  She was referred to  an participated in pelvic floor PT. Polyp was a 5 mm sessile serrated polyp no recall planned given her age and findings  EGD 05/30/2021 Shannon Barajas - Mild, non-specific distal gastritis. Biopsied to check for H. pylori. - Previously known Schatzki's ring was found at the  gastroesophageal junction. This was only mildly stenotic. A TTS dilator was passed through the scope. Dilation with an 18-19-20 mm balloon dilator was performed to 20 mm. - Small amount of retained solid food in her stomach. - The examination was otherwise normal.  Biopsies without H. pylori.  Gastric emptying scan performed x 4 hours and was normal Allergies  Allergen Reactions   Biaxin [Clarithromycin] Other (See Comments)    REACTION: aggrevates IBS   Erythromycin     REACTION: aggrevates IBS   Soy Allergy (Obsolete) Other (See Comments)    May arregevate interstitial cystitis   Zithromax [Azithromycin]     Upset stomach   Zofran [Ondansetron Hcl]     Daughter had anaphlaxis from, patient does not take   Current Meds  Medication Sig   ascorbic acid (VITAMIN C) 500 MG tablet Take 500 mg by mouth daily.   Cholecalciferol (VITAMIN D) 50 MCG (2000 UT) tablet Take 2,000 Units by mouth daily.   COLLAGEN PO Take 1 capsule by mouth in the morning and at bedtime.   dicyclomine (BENTYL) 20 MG tablet Take 1 tablet (20 mg total) by mouth 4 (four) times daily -  before meals and at bedtime.   estradiol (ESTRACE) 1 MG tablet Take 1 mg by mouth at bedtime.   famotidine (PEPCID) 20 MG tablet Take 1 tablet (20 mg total) by mouth at bedtime.   Linaclotide (LINZESS) 290 MCG CAPS capsule Take 1 capsule (290 mcg total) by mouth every morning.   metoCLOPramide (REGLAN) 5 MG tablet TAKE 1 TABLET BY MOUTH 2 (TWO) TIMES DAILY AT 10 AM AND 4 PM. (Patient taking differently: Take 10 mg by mouth in the morning.)   Multiple Vitamin (MULTIVITAMIN WITH MINERALS) TABS tablet Take 1 tablet by mouth daily.   pantoprazole (PROTONIX) 40 MG tablet Take 1 tablet (40 mg total) by mouth 2 (two) times daily before a meal.   Probiotic Product (PROBIOTIC GUMMIES PO) Take 2 each by mouth daily.   progesterone (PROMETRIUM) 100 MG capsule Take 100 mg by mouth at bedtime.    temazepam (RESTORIL) 30 MG capsule Take 2 capsules (60  mg total) by mouth at bedtime.   thiamine (VITAMIN B-1) 50 MG tablet Take 50 mg by mouth daily.   Past Medical History:  Diagnosis Date   Allergy    SEASONAL   Anemia    Arthritis    Cataract    BILATERAL   Chronic interstitial cystitis    Chronic kidney disease (CKD), stage III (moderate) (HCC) 05/25/2014   nephrology dr Lacy Duverney q year   GERD (gastroesophageal reflux disease)    H/O: iron deficiency anemia    Hemorrhoids 07/26/2013   Insomnia, unspecified    Interstitial cystitis    Irritable bowel syndrome    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HCC) 11/14/2013   Non Hodgkin's lymphoma (HCC) 04/19/2013   Osteoporosis    PONV (postoperative nausea and vomiting)    likes phenergan   Raynaud's syndrome    Splenomegaly    Stricture and stenosis of esophagus    Thrombocytopenia, unspecified (HCC)    Unspecified constipation    Past Surgical History:  Procedure Laterality Date   APPENDECTOMY  2012   BALLOON DILATION N/A  09/06/2014   Procedure: BALLOON DILATION;  Surgeon: Rachael Fee, MD;  Location: [facility];  Service: Endoscopy;  Laterality: N/A;   BREAST LUMPECTOMY Left    CHOLECYSTECTOMY N/A 12/30/2022   Procedure: LAPAROSCOPIC CHOLECYSTECTOMY;  Surgeon: Abigail Miyamoto, MD;  Location: [facility];  Service: General;  Laterality: N/A;   DILATION AND CURETTAGE OF UTERUS  yrs ago   ESOPHAGEAL DILATION     ESOPHAGOGASTRODUODENOSCOPY (EGD) WITH PROPOFOL N/A 09/06/2014   Procedure: ESOPHAGOGASTRODUODENOSCOPY (EGD) WITH PROPOFOL;  Surgeon: Rachael Fee, MD;  Location: [facility];  Service: Endoscopy;  Laterality: N/A;   EYE SURGERY Bilateral 2021   cataract extraction w/ IOL   HYSTEROSCOPY WITH D & C N/A 12/08/2018   Procedure: DILATATION AND CURETTAGE /HYSTEROSCOPY;  Surgeon: Marcelle Overlie, MD;  Location: [facility] [place];  Service: Gynecology;  Laterality: N/A;   TONSILLECTOMY     TUBAL LIGATION     VIDEO BRONCHOSCOPY Bilateral 08/23/2015   Procedure:  VIDEO BRONCHOSCOPY WITH FLUORO;  Surgeon: Roslynn Amble, MD;  Location: [facility];  Service: Cardiopulmonary;  Laterality: Bilateral;   Social History   Social History Narrative   Originally from [place]. She moved to [place] in the 1980s. She is a Animal nutritionist. She has also traveled to [place], [place], & [place]. No pets currently. No bird exposure. 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 Enjoys spending time with family & reading.    family history includes Alzheimer's disease in her mother; Cancer in her maternal grandfather; Cirrhosis in her father; Colon cancer in her maternal grandfather; Diabetes in her maternal grandmother; Lymphoma in her father.   Review of Systems As per HPI  Objective:   Physical Exam @BP  100/64   Pulse 72   Ht 5\' 7"  (1.702 m)   Wt 139 lb 6.4 oz (63.2 kg)   BMI 21.83 kg/m @  General:  NAD Eyes:   anicteric Lungs:  clear Heart::  S1S2 no rubs, murmurs or gallops Abdomen:  soft and nontender, BS+ Ext:   no edema, cyanosis or clubbing    Data Reviewed:  See HPI  35 minutes total time before during and after patient interaction

## 2023-04-20 ENCOUNTER — Other Ambulatory Visit: Payer: Self-pay | Admitting: Registered Nurse

## 2023-04-20 DIAGNOSIS — W19XXXA Unspecified fall, initial encounter: Secondary | ICD-10-CM

## 2023-04-20 DIAGNOSIS — M950 Acquired deformity of nose: Secondary | ICD-10-CM

## 2023-04-21 ENCOUNTER — Ambulatory Visit
Admission: RE | Admit: 2023-04-21 | Discharge: 2023-04-21 | Disposition: A | Discharge status: Home / Self Care | Source: Ambulatory Visit | Attending: Registered Nurse | Admitting: Registered Nurse

## 2023-04-21 DIAGNOSIS — M950 Acquired deformity of nose: Secondary | ICD-10-CM

## 2023-04-21 DIAGNOSIS — S0993XA Unspecified injury of face, initial encounter: Secondary | ICD-10-CM | POA: Diagnosis not present

## 2023-04-21 DIAGNOSIS — W19XXXA Unspecified fall, initial encounter: Secondary | ICD-10-CM

## 2023-05-10 DIAGNOSIS — H6502 Acute serous otitis media, left ear: Secondary | ICD-10-CM | POA: Diagnosis not present

## 2023-05-10 DIAGNOSIS — L089 Local infection of the skin and subcutaneous tissue, unspecified: Secondary | ICD-10-CM | POA: Diagnosis not present

## 2023-05-14 DIAGNOSIS — H6092 Unspecified otitis externa, left ear: Secondary | ICD-10-CM | POA: Diagnosis not present

## 2023-05-17 ENCOUNTER — Telehealth: Payer: Self-pay | Admitting: Internal Medicine

## 2023-05-17 ENCOUNTER — Other Ambulatory Visit: Payer: Self-pay

## 2023-05-17 NOTE — Telephone Encounter (Signed)
 We discussed that some at her last visit.  Okay to order the SIBO test lactulose hydrogen breath test.

## 2023-05-17 NOTE — Telephone Encounter (Signed)
 Patient contacted to come pick up the test kit. Brief explanation given over the telephone.

## 2023-05-17 NOTE — Telephone Encounter (Signed)
 Patient has "been careful with what I eat" avoiding gas causing foods. She reports her abdomen is bloated, lots of gas and " I'm pretty much just miserable." She wants to be tested for SIBO. Please advise.

## 2023-05-17 NOTE — Telephone Encounter (Signed)
 Patient called and stated that she was speaking to her PCP regarding her Abdominal pain and her constant bloating and excessive gas. PCP regarded her to have a breathe test done with us  due to her having possible over growth bacteria. Patient is requesting to speak to the nurse regarding her symptoms due to her not having anything done during her last visit. Please advise.

## 2023-05-19 DIAGNOSIS — H60502 Unspecified acute noninfective otitis externa, left ear: Secondary | ICD-10-CM | POA: Diagnosis not present

## 2023-05-24 ENCOUNTER — Other Ambulatory Visit: Payer: Self-pay | Admitting: Internal Medicine

## 2023-05-24 MED ORDER — PANTOPRAZOLE SODIUM 40 MG PO TBEC: 40.0000 mg | DELAYED_RELEASE_TABLET | Freq: Two times a day (BID) | ORAL | 0 refills | Status: DC

## 2023-05-24 NOTE — Telephone Encounter (Signed)
 I spoke with Shannon Barajas and she said the increase in the pantoprazole  to BID is really helping. She is going out of town next week and wants to get a refill for BID dosing. She also needs the dicyclomine  refilled. Dr Willy Harvest is out until Wednesday. I will let him know that I sent in refills. She is having to hold off doing the SIBO kit until she pays them $99 up front.

## 2023-06-01 NOTE — Telephone Encounter (Signed)
 Called and spoke with patient. She reports having concerns over completing the SIBO test due to having to avoid all laxatives x1 week prior to completing the test. She also reports that she is lactose intolerant and the test packaging specifically tells her that if she is lactose intolerant she should not complete the test kit. She would like to know if Dr. Willy Harvest will administer medication for SIBO without her completing the test. She states, "I know there is something wrong with my stomach that is more than my IBS, I just don't know what it is, and there is no way I can do that test if I have to avoid laxatives." Will route to provider for further review.

## 2023-06-01 NOTE — Telephone Encounter (Signed)
 PT picked up breath test and she was told that she cannot take laxatives and she has to take them everyday. She is also allergic to "lactose." She would like to discuss this with a nurse

## 2023-06-01 NOTE — Telephone Encounter (Signed)
 1.  Just for clarification the test uses a lactulose and should not be done if you are allergic to that.  It is not lactose or lactose intolerance, it is perfectly fine to do that.  2.  She can take metronidazole 250 mg 3 times a day #30 (10-day course) no refills if she does not want to do the test because she does not think she can stop taking laxatives.

## 2023-06-02 MED ORDER — METRONIDAZOLE 250 MG PO TABS: 250.0000 mg | ORAL_TABLET | Freq: Three times a day (TID) | ORAL | 0 refills | Status: AC

## 2023-06-02 NOTE — Telephone Encounter (Signed)
 Called and spoke to patient. She states she doesn't feel comfortable doing the breath test. She is ok with trying the Metronidazole. RX sent to pharmacy.

## 2023-06-02 NOTE — Addendum Note (Signed)
 Addended by: Laurie Poplar on: 06/02/2023 09:29 AM   Modules accepted: Orders

## 2023-06-02 NOTE — Telephone Encounter (Signed)
 Attempted to call patient. No answer, left VM for patient to return call.

## 2023-06-02 NOTE — Telephone Encounter (Signed)
 Inbound call from patient stating she was advised pharmacist not to use any products that contain alcohol . Patient stated that all of the products that she uses for her hair contain alcohol . Patient is requesting a call to discuss further and what else to avoid while taking medication. Please advise, thank you.

## 2023-06-03 NOTE — Telephone Encounter (Signed)
 Called and spoke with patient. She states that her pharmacist told her not to use any products containing alcohol , while taking Flagyl. Per patient, the pharmacist stated that this includes any hair or skin products that may contain alcohol  as this can "seep into the skin and make me very sick." Advised patient that this is not something we typically tell patients and that it is recommended that she not drink alcoholic beverages with the Flagyl.Encouraged the patient to again reach out to her pharmacist for further clarification if she had further questions. Patient verbalized understanding.

## 2023-07-03 ENCOUNTER — Other Ambulatory Visit: Payer: Self-pay | Admitting: Internal Medicine

## 2023-07-26 ENCOUNTER — Telehealth: Payer: Self-pay | Admitting: Internal Medicine

## 2023-07-26 NOTE — Telephone Encounter (Signed)
 We can ask [facility] if they are willing to accept and see her.

## 2023-07-26 NOTE — Telephone Encounter (Signed)
 Good afternoon Dr. Stacia,   As the afternoon Doc of Day, would you be willing to accept this patient as a transfer? Please advise,   Thank you.

## 2023-07-26 NOTE — Telephone Encounter (Signed)
 Good Afternoon Dr. Avram,   I received a call from this patient wishing to schedule an appointment due to continuous belching. Once patient was advised of your schedule, she requested a transfer of Care to a different physician here at our practice. She states  nothing was done at my last visit and would like a second opinion and a fresh set of eyes. Patient was last seen by you in March. Will send to [facility]. Would you please review and advise on transfer?  Thank you.

## 2023-07-26 NOTE — Progress Notes (Signed)
 Cardiology Office Note:   Date:  08/02/2023  ID:  Shannon Barajas, DOB [birth year], MRN [id number] PCP:  Larnell Hamilton, MD  [facility] Providers Cardiologist:  Wendel Haws, MD Referring MD: Verta Izetta HERO, NP  Chief Complaint/Reason for Referral:  Syncope ASSESSMENT:    1. Syncope and collapse   2. Hyperlipidemia LDL goal <70   3. Aortic atherosclerosis (HCC)   4. Stage 3a chronic kidney disease (HCC)     PLAN:   In order of problems listed above: Syncope: Will obtain monitor and echocardiogram to evaluate further. Hyperlipidemia: By virtue of the presence of aortic atherosclerosis patient's LDL goal is at least less than 70.  Start atorvastatin  20 mg and check lipid panel, LFTs, LP(a) in 2 months. Aortic atherosclerosis: Start aspirin  81 mg and atorvastatin  20 mg. CKD stage IIIa: Could consider SGLT2 inhibitor and ARB for renal protection purposes in the future.            Dispo:  Return in about 6 months (around 02/02/2024).      Medication Adjustments/Labs and Tests Ordered: Current medicines are reviewed at length with the patient today.  Concerns regarding medicines are outlined above.  The following changes have been made:     Labs/tests ordered: Orders Placed This Encounter  Procedures   Lipid panel   Hepatic function panel   Lipoprotein A (LPA)   LONG TERM MONITOR (3-14 DAYS)   ECHOCARDIOGRAM COMPLETE    Medication Changes: Meds ordered this encounter  Medications   aspirin  EC 81 MG tablet    Sig: Take 1 tablet (81 mg total) by mouth daily. Swallow whole.   atorvastatin  (LIPITOR) 20 MG tablet    Sig: Take 1 tablet (20 mg total) by mouth daily.    Dispense:  90 tablet    Refill:  3    Current medicines are reviewed at length with the patient today.  The patient does not have concerns regarding medicines.     History of Present Illness:    FOCUSED PROBLEM LIST:   Non-Hodgkin's lymphoma Treated with rituximab  >> remission CKD  IIIA Hyperlipidemia Aortic atherosclerosis Chest CT 2024  July 2025:  Patient consents to use of AI scribe. The patient is a 73 year old female with the above listed medical problems referred for recommendations regarding syncope.  The patient had been seen by the PCP recently and had reported a syncopal episode.  This happened around the time her mother died.  She has had no recurrent episodes of syncope since then.  In March, she experienced a syncopal episode shortly after the passing of her mother, who had Alzheimer's and was in a nursing home for eight years. 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 The episode occurred late at night, and she felt 'a little weird' before losing consciousness completely. Darrell found her on the floor with epistaxis and called EMS, who found everything appeared normal. She has not experienced any similar episodes since.  No palpitations, shortness of breath, chest pain, or swelling in her legs. She also reports no issues with breathing while lying flat.  She has a history of IBS with constipation and is currently experiencing significant gastric symptoms, including excessive burping and gas, which she attributes to a possible hiatal hernia. She is trying to see a different GI doctor for further evaluation. She reports occasional rectal bleeding, which has been attributed to hemorrhoids.  Her cholesterol was last checked in February, showing a total cholesterol of 208 mg/dL with an LDL of 894 mg/dL. She  eats eggs daily for breakfast and is concerned about its impact on her cholesterol levels. She has a history of aortic atherosclerosis noted on a CT scan in 2024.  She is currently on Protonix  and other medications for her stomach issues. She has a history of Hodgkin's lymphoma and is cautious about medication side effects due to past experiences.      Current Medications: Current Meds  Medication Sig   ascorbic acid (VITAMIN C) 500 MG tablet Take 500 mg by  mouth daily.   aspirin  EC 81 MG tablet Take 1 tablet (81 mg total) by mouth daily. Swallow whole.   atorvastatin  (LIPITOR) 20 MG tablet Take 1 tablet (20 mg total) by mouth daily.   Cholecalciferol (VITAMIN D) 50 MCG (2000 UT) tablet Take 2,000 Units by mouth daily.   COLLAGEN PO Take 1 capsule by mouth in the morning and at bedtime.   dicyclomine  (BENTYL ) 20 MG tablet TAKE 1 TABLET BY MOUTH 4 TIMES DAILY BEFORE MEAL(S) AND AT BEDTIME   estradiol (ESTRACE) 1 MG tablet Take 1 mg by mouth at bedtime.   famotidine  (PEPCID ) 20 MG tablet Take 1 tablet (20 mg total) by mouth at bedtime.   Linaclotide  (LINZESS ) 290 MCG CAPS capsule Take 1 capsule (290 mcg total) by mouth every morning.   Methylcobalamin (B12) 5000 MCG SUBL as directed Sublingual   metoCLOPramide  (REGLAN ) 5 MG tablet TAKE 1 TABLET BY MOUTH 2 (TWO) TIMES DAILY AT 10 AM AND 4 PM. (Patient taking differently: Take 10 mg by mouth in the morning.)   Multiple Vitamin (MULTIVITAMIN WITH MINERALS) TABS tablet Take 1 tablet by mouth daily.   pantoprazole  (PROTONIX ) 40 MG tablet Take 1 tablet (40 mg total) by mouth 2 (two) times daily before a meal.   Probiotic Product (PROBIOTIC GUMMIES PO) Take 2 each by mouth daily.   progesterone (PROMETRIUM) 100 MG capsule Take 100 mg by mouth at bedtime.    progesterone (PROMETRIUM) 100 MG capsule Take 100 mg by mouth daily.   simethicone (MYLICON) 80 MG chewable tablet 1 tablet after meals and at bedtime as needed Orally Four times a day   temazepam  (RESTORIL ) 30 MG capsule Take 2 capsules (60 mg total) by mouth at bedtime.   thiamine (VITAMIN B-1) 50 MG tablet Take 50 mg by mouth daily.     Review of Systems:   Please see the history of present illness.    All other systems reviewed and are negative.     EKGs/Labs/Other Test Reviewed:   EKG:  2024 NSR  EKG Interpretation Date/Time:    Ventricular Rate:    PR Interval:    QRS Duration:    QT Interval:    QTC Calculation:   R Axis:       Text Interpretation:           Risk Assessment/Calculations:          Physical Exam:   VS:  BP (!) 106/56   Pulse 68   Ht 5' 7 (1.702 m)   Wt 139 lb (63 kg)   SpO2 98%   BMI 21.77 kg/m        Wt Readings from Last 3 Encounters:  08/02/23 139 lb (63 kg)  04/19/23 139 lb 6.4 oz (63.2 kg)  12/30/22 138 lb 0.1 oz (62.6 kg)      GENERAL:  No apparent distress, AOx3 HEENT:  No carotid bruits, +2 carotid impulses, no scleral icterus CAR: RRR no murmurs, gallops, rubs, or thrills RES:  Clear to auscultation bilaterally ABD:  Soft, nontender, nondistended, positive bowel sounds x 4 VASC:  +2 radial pulses, +2 carotid pulses NEURO:  CN 2-12 grossly intact; motor and sensory grossly intact PSYCH:  No active depression or anxiety EXT:  No edema, ecchymosis, or cyanosis  Signed, Lurena MARLA Red, MD  08/02/2023 2:40 PM    [facility] [address] [place], [place], [place]  [address] Phone: [phone]; Fax: [phone]   Note:  This document was prepared using Dragon voice recognition software and may include unintentional dictation errors.

## 2023-07-27 DIAGNOSIS — D3132 Benign neoplasm of left choroid: Secondary | ICD-10-CM | POA: Diagnosis not present

## 2023-07-27 DIAGNOSIS — H04123 Dry eye syndrome of bilateral lacrimal glands: Secondary | ICD-10-CM | POA: Diagnosis not present

## 2023-07-27 DIAGNOSIS — H43813 Vitreous degeneration, bilateral: Secondary | ICD-10-CM | POA: Diagnosis not present

## 2023-07-27 DIAGNOSIS — Z961 Presence of intraocular lens: Secondary | ICD-10-CM | POA: Diagnosis not present

## 2023-07-30 ENCOUNTER — Other Ambulatory Visit: Payer: Self-pay | Admitting: Internal Medicine

## 2023-08-02 ENCOUNTER — Ambulatory Visit: Attending: Internal Medicine | Admitting: Internal Medicine

## 2023-08-02 ENCOUNTER — Encounter: Payer: Self-pay | Admitting: Internal Medicine

## 2023-08-02 VITALS — BP 106/56 | HR 68 | Ht 67.0 in | Wt 139.0 lb

## 2023-08-02 DIAGNOSIS — R55 Syncope and collapse: Secondary | ICD-10-CM

## 2023-08-02 DIAGNOSIS — I700 Atherosclerosis of aorta: Secondary | ICD-10-CM | POA: Diagnosis not present

## 2023-08-02 DIAGNOSIS — E785 Hyperlipidemia, unspecified: Secondary | ICD-10-CM | POA: Diagnosis not present

## 2023-08-02 DIAGNOSIS — N1831 Chronic kidney disease, stage 3a: Secondary | ICD-10-CM | POA: Diagnosis not present

## 2023-08-02 MED ORDER — ATORVASTATIN CALCIUM 20 MG PO TABS: 20.0000 mg | ORAL_TABLET | Freq: Every day | ORAL | 3 refills | Status: DC

## 2023-08-02 MED ORDER — ASPIRIN 81 MG PO TBEC
81.0000 mg | DELAYED_RELEASE_TABLET | Freq: Every day | ORAL | Status: AC
Start: 2023-08-02 — End: ?

## 2023-08-02 NOTE — Patient Instructions (Addendum)
 Medication Instructions:  Your physician has recommended you make the following change in your medication:  1-START Aspirin  81 mg by mouth daily 2-START Atorvastatin  20 mg by mouth daily  *If you need a refill on your cardiac medications before your next appointment, please call your pharmacy*  Lab Work: Your physician recommends that you return for lab work in: 2 months for LPa, lipid and liver panel.  If you have labs (blood work) drawn today and your tests are completely normal, you will receive your results only by: MyChart Message (if you have MyChart) OR A paper copy in the mail If you have any lab test that is abnormal or we need to change your treatment, we will call you to review the results.  Testing/Procedures: Your physician has requested that you have an echocardiogram. Echocardiography is a painless test that uses sound waves to create images of your heart. It provides your doctor with information about the size and shape of your heart and how well your heart's chambers and valves are working. This procedure takes approximately one hour. There are no restrictions for this procedure. Please do NOT wear cologne, perfume, aftershave, or lotions (deodorant is allowed). Please arrive 15 minutes prior to your appointment time.  Please note: We ask at that you not bring children with you during ultrasound (echo/ vascular) testing. Due to room size and safety concerns, children are not allowed in the ultrasound rooms during exams. Our front office staff cannot provide observation of children in our lobby area while testing is being conducted. An adult accompanying a patient to their appointment will only be allowed in the ultrasound room at the discretion of the ultrasound technician under special circumstances. We apologize for any inconvenience. Your physician has recommended that you wear a 3 day monitor. Monitors are medical devices that record the heart's electrical activity. Doctors  most often us  these monitors to diagnose arrhythmias. Arrhythmias are problems with the speed or rhythm of the heartbeat. The monitor is a small, portable device. You can wear one while you do your normal daily activities. This is usually used to diagnose what is causing palpitations/syncope (passing out). Follow-Up: At [facility], you and your health needs are our priority.  As part of our continuing mission to provide you with exceptional heart care, our providers are all part of one team.  This team includes your primary Cardiologist (physician) and Advanced Practice Providers or APPs (Physician Assistants and Nurse Practitioners) who all work together to provide you with the care you need, when you need it.  Your next appointment:   6 months  Provider:   PA or NP  We recommend signing up for the patient portal called MyChart.  Sign up information is provided on this After Visit Summary.  MyChart is used to connect with patients for Virtual Visits (Telemedicine).  Patients are able to view lab/test results, encounter notes, upcoming appointments, etc.  Non-urgent messages can be sent to your provider as well.   To learn more about what you can do with MyChart, go to [email or web address].

## 2023-08-03 ENCOUNTER — Ambulatory Visit: Attending: Internal Medicine

## 2023-08-03 DIAGNOSIS — N1831 Chronic kidney disease, stage 3a: Secondary | ICD-10-CM

## 2023-08-03 DIAGNOSIS — R55 Syncope and collapse: Secondary | ICD-10-CM

## 2023-08-03 DIAGNOSIS — I700 Atherosclerosis of aorta: Secondary | ICD-10-CM

## 2023-08-03 DIAGNOSIS — E785 Hyperlipidemia, unspecified: Secondary | ICD-10-CM

## 2023-08-03 NOTE — Progress Notes (Unsigned)
 Enrolled for [employer] to mail a ZIO XT long term holter monitor to the patients address on file.

## 2023-08-14 ENCOUNTER — Other Ambulatory Visit: Payer: Self-pay | Admitting: Internal Medicine

## 2023-08-16 DIAGNOSIS — Z1151 Encounter for screening for human papillomavirus (HPV): Secondary | ICD-10-CM | POA: Diagnosis not present

## 2023-08-16 DIAGNOSIS — Z6821 Body mass index (BMI) 21.0-21.9, adult: Secondary | ICD-10-CM | POA: Diagnosis not present

## 2023-08-16 DIAGNOSIS — Z124 Encounter for screening for malignant neoplasm of cervix: Secondary | ICD-10-CM | POA: Diagnosis not present

## 2023-08-18 ENCOUNTER — Other Ambulatory Visit: Payer: Self-pay | Admitting: Internal Medicine

## 2023-08-19 ENCOUNTER — Telehealth: Payer: Self-pay | Admitting: Gastroenterology

## 2023-08-19 ENCOUNTER — Ambulatory Visit: Payer: Self-pay | Admitting: Internal Medicine

## 2023-08-19 DIAGNOSIS — E785 Hyperlipidemia, unspecified: Secondary | ICD-10-CM | POA: Diagnosis not present

## 2023-08-19 DIAGNOSIS — N1831 Chronic kidney disease, stage 3a: Secondary | ICD-10-CM | POA: Diagnosis not present

## 2023-08-19 DIAGNOSIS — I700 Atherosclerosis of aorta: Secondary | ICD-10-CM | POA: Diagnosis not present

## 2023-08-19 DIAGNOSIS — R55 Syncope and collapse: Secondary | ICD-10-CM

## 2023-08-19 NOTE — Telephone Encounter (Signed)
 Inbound call from patient stating she needs a refill on medication Dicyclomine . Pharmacy is requesting a new prescription. Patient also stated she has an upcoming ov on 09/28/2023.  Please advise  Thank you

## 2023-08-20 MED ORDER — DICYCLOMINE HCL 20 MG PO TABS: 20.0000 mg | ORAL_TABLET | Freq: Three times a day (TID) | ORAL | 2 refills | Status: DC

## 2023-08-20 NOTE — Telephone Encounter (Signed)
 Bentyl refilled.

## 2023-09-06 ENCOUNTER — Ambulatory Visit: Admitting: Gastroenterology

## 2023-09-09 ENCOUNTER — Ambulatory Visit (HOSPITAL_COMMUNITY)
Admission: RE | Admit: 2023-09-09 | Discharge: 2023-09-09 | Disposition: A | Discharge status: Home / Self Care | Source: Ambulatory Visit | Attending: Cardiology | Admitting: Cardiology

## 2023-09-09 DIAGNOSIS — I700 Atherosclerosis of aorta: Secondary | ICD-10-CM | POA: Insufficient documentation

## 2023-09-09 DIAGNOSIS — R55 Syncope and collapse: Secondary | ICD-10-CM | POA: Insufficient documentation

## 2023-09-09 DIAGNOSIS — E785 Hyperlipidemia, unspecified: Secondary | ICD-10-CM | POA: Insufficient documentation

## 2023-09-09 DIAGNOSIS — N1831 Chronic kidney disease, stage 3a: Secondary | ICD-10-CM | POA: Insufficient documentation

## 2023-09-09 LAB — ECHOCARDIOGRAM COMPLETE
Area-P 1/2: 4.31 cm2
S' Lateral: 2.9 cm

## 2023-09-28 ENCOUNTER — Encounter: Payer: Self-pay | Admitting: Gastroenterology

## 2023-09-28 ENCOUNTER — Ambulatory Visit: Admitting: Gastroenterology

## 2023-09-28 ENCOUNTER — Other Ambulatory Visit: Payer: Self-pay

## 2023-09-28 VITALS — BP 102/62 | HR 74 | Ht 67.0 in | Wt 137.8 lb

## 2023-09-28 DIAGNOSIS — N1831 Chronic kidney disease, stage 3a: Secondary | ICD-10-CM | POA: Diagnosis not present

## 2023-09-28 DIAGNOSIS — R140 Abdominal distension (gaseous): Secondary | ICD-10-CM

## 2023-09-28 DIAGNOSIS — Z8719 Personal history of other diseases of the digestive system: Secondary | ICD-10-CM | POA: Diagnosis not present

## 2023-09-28 DIAGNOSIS — E785 Hyperlipidemia, unspecified: Secondary | ICD-10-CM

## 2023-09-28 DIAGNOSIS — R55 Syncope and collapse: Secondary | ICD-10-CM

## 2023-09-28 DIAGNOSIS — R143 Flatulence: Secondary | ICD-10-CM

## 2023-09-28 DIAGNOSIS — K2100 Gastro-esophageal reflux disease with esophagitis, without bleeding: Secondary | ICD-10-CM

## 2023-09-28 DIAGNOSIS — K581 Irritable bowel syndrome with constipation: Secondary | ICD-10-CM

## 2023-09-28 DIAGNOSIS — I700 Atherosclerosis of aorta: Secondary | ICD-10-CM

## 2023-09-28 MED ORDER — OMEPRAZOLE 20 MG PO CPDR: 20.0000 mg | DELAYED_RELEASE_CAPSULE | Freq: Every day | ORAL | 3 refills | Status: AC

## 2023-09-28 NOTE — Progress Notes (Unsigned)
 HPI :    Excessive belching/bloating/gas since May Distention  Dairy free/gluten free  Takes Linzess  290 mcg every other day. Has several bowel movements.  Amitiza   Saw [facility], [facility] (referred to hematology), then Dr. Teressa, then Dr. Avram Given Flagyl   CCY in Dec last year.       Past Medical History:  Diagnosis Date   Allergy    SEASONAL   Anemia    Arthritis    Cataract    BILATERAL   Chronic interstitial cystitis    Chronic kidney disease (CKD), stage III (moderate) (HCC) 05/25/2014   nephrology dr clayborn q year   GERD (gastroesophageal reflux disease)    H/O: iron deficiency anemia    Hemorrhoids 07/26/2013   Insomnia, unspecified    Interstitial cystitis    Irritable bowel syndrome    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HCC) 11/14/2013   Non Hodgkin's lymphoma (HCC) 04/19/2013   Osteoporosis    PONV (postoperative nausea and vomiting)    likes phenergan    Raynaud's syndrome    Splenomegaly    Stricture and stenosis of esophagus    Thrombocytopenia, unspecified (HCC)    Unspecified constipation      Past Surgical History:  Procedure Laterality Date   APPENDECTOMY  2012   BALLOON DILATION N/A 09/06/2014   Procedure: BALLOON DILATION;  Surgeon: Toribio SHAUNNA Teressa, MD;  Location: [facility];  Service: Endoscopy;  Laterality: N/A;   BREAST LUMPECTOMY Left    CHOLECYSTECTOMY N/A 12/30/2022   Procedure: LAPAROSCOPIC CHOLECYSTECTOMY;  Surgeon: Vernetta Berg, MD;  Location: [facility];  Service: General;  Laterality: N/A;   DILATION AND CURETTAGE OF UTERUS  yrs ago   ESOPHAGEAL DILATION     ESOPHAGOGASTRODUODENOSCOPY (EGD) WITH PROPOFOL  N/A 09/06/2014   Procedure: ESOPHAGOGASTRODUODENOSCOPY (EGD) WITH PROPOFOL ;  Surgeon: Toribio SHAUNNA Teressa, MD;  Location: [facility];  Service: Endoscopy;  Laterality: N/A;   EYE SURGERY Bilateral 2021   cataract extraction w/ IOL   HYSTEROSCOPY WITH D & C N/A 12/08/2018   Procedure: DILATATION AND CURETTAGE  /HYSTEROSCOPY;  Surgeon: Mat Browning, MD;  Location: [facility];  Service: Gynecology;  Laterality: N/A;   TONSILLECTOMY     TUBAL LIGATION     VIDEO BRONCHOSCOPY Bilateral 08/23/2015   Procedure: VIDEO BRONCHOSCOPY WITH FLUORO;  Surgeon: Tonnie FORBES Flicker, MD;  Location: [facility];  Service: Cardiopulmonary;  Laterality: Bilateral;   Family History  Problem Relation Age of Onset   Alzheimer's disease Mother    Lymphoma Father    Cirrhosis Father        heavy drinker   Diabetes Maternal Grandmother    Colon cancer Maternal Grandfather    Cancer Maternal Grandfather        liver ca ?   Esophageal cancer Neg Hx    Rectal cancer Neg Hx    Stomach cancer Neg Hx    Social History   Tobacco Use   Smoking status: Never    Passive exposure: Yes   Smokeless tobacco: Never   Tobacco comments:    Father growing up & daughter currently  Vaping Use   Vaping status: Never Used  Substance Use Topics   Alcohol  use: No   Drug use: No   Current Outpatient Medications  Medication Sig Dispense Refill   acetaminophen  (TYLENOL ) 325 MG tablet Take 650 mg by mouth every 6 (six) hours as needed.     ascorbic acid (VITAMIN C) 500 MG tablet Take 500 mg by mouth daily.  aspirin  EC 81 MG tablet Take 1 tablet (81 mg total) by mouth daily. Swallow whole.     atorvastatin  (LIPITOR) 20 MG tablet Take 1 tablet (20 mg total) by mouth daily. 90 tablet 3   Cholecalciferol (VITAMIN D) 50 MCG (2000 UT) tablet Take 2,000 Units by mouth daily.     COLLAGEN PO Take 1 capsule by mouth in the morning and at bedtime.     dicyclomine  (BENTYL ) 20 MG tablet Take 1 tablet (20 mg total) by mouth 4 (four) times daily -  before meals and at bedtime. (Patient taking differently: Take 20 mg by mouth 3 (three) times daily before meals.) 120 tablet 2   estradiol (ESTRACE) 1 MG tablet Take 1 mg by mouth at bedtime.     famotidine  (PEPCID ) 20 MG tablet Take 1 tablet (20 mg total) by mouth at bedtime. 90  tablet 2   Linaclotide  (LINZESS ) 290 MCG CAPS capsule Take 1 capsule (290 mcg total) by mouth every morning. (Patient taking differently: Take 290 mcg by mouth every morning. Taking every other day) 30 capsule 5   Methylcobalamin (B12) 5000 MCG SUBL as directed Sublingual     metoCLOPramide  (REGLAN ) 5 MG tablet TAKE 1 TABLET BY MOUTH 2 (TWO) TIMES DAILY AT 10 AM AND 4 PM. 180 tablet 1   Multiple Vitamin (MULTIVITAMIN WITH MINERALS) TABS tablet Take 1 tablet by mouth daily.     pantoprazole  (PROTONIX ) 40 MG tablet Take 1 tablet (40 mg total) by mouth daily. (Patient taking differently: Take 40 mg by mouth 2 (two) times daily.) 90 tablet 0   Probiotic Product (PROBIOTIC GUMMIES PO) Take 2 each by mouth daily.     progesterone (PROMETRIUM) 100 MG capsule Take 100 mg by mouth at bedtime.      Propylene Glycol (SYSTANE COMPLETE PF OP) Apply 1 drop to eye daily.     simethicone (MYLICON) 80 MG chewable tablet 1 tablet after meals and at bedtime as needed Orally Four times a day     temazepam  (RESTORIL ) 30 MG capsule Take 2 capsules (60 mg total) by mouth at bedtime. 60 capsule 1   thiamine (VITAMIN B-1) 50 MG tablet Take 50 mg by mouth daily.     No current facility-administered medications for this visit.   Allergies  Allergen Reactions   Biaxin [Clarithromycin] Other (See Comments)    REACTION: aggrevates IBS   Erythromycin     REACTION: aggrevates IBS   Soy Allergy (Obsolete) Other (See Comments)    May arregevate interstitial cystitis   Zithromax [Azithromycin]     Upset stomach   Zofran  [Ondansetron  Hcl]     Daughter had anaphlaxis from, patient does not take     Review of Systems: All systems reviewed and negative except where noted in HPI.    ECHOCARDIOGRAM COMPLETE Result Date: 09/09/2023    ECHOCARDIOGRAM REPORT   Patient Name:   Shannon Barajas Date of Exam: 09/09/2023 Medical Rec #:  [id number]         Height:       67.0 in Accession #:    [id number]        Weight:       139.0  lb Date of Birth:  October 06, 1950         BSA:          1.733 m Patient Age:    73 years          BP:  118/76 mmHg Patient Gender: F                 HR:           67 bpm. Exam Location:  [address] Procedure: 2D Echo, 3D Echo, Cardiac Doppler, Color Doppler and Strain Analysis            (Both Spectral and Color Flow Doppler were utilized during            procedure). Indications:    R55 Syncope  History:        Patient has no prior history of Echocardiogram examinations.                 CKD; Risk Factors:HLD.  Sonographer:    Waldo Guadalajara RCS Referring Phys: [id number] ARUN K THUKKANI IMPRESSIONS  1. Left ventricular ejection fraction, by estimation, is 60 to 65%. Left ventricular ejection fraction by 3D volume is 62 %. The left ventricle has normal function. The left ventricle has no regional wall motion abnormalities. Left ventricular diastolic  parameters were normal. The average left ventricular global longitudinal strain is -20.1 %. The global longitudinal strain is normal.  2. Right ventricular systolic function is normal. The right ventricular size is normal. There is normal pulmonary artery systolic pressure. The estimated right ventricular systolic pressure is 27.8 mmHg.  3. There is no evidence of cardiac tamponade.  4. The mitral valve is normal in structure. Mild mitral valve regurgitation. No evidence of mitral stenosis.  5. The aortic valve is tricuspid. Aortic valve regurgitation is not visualized. No aortic stenosis is present.  6. The inferior vena cava is normal in size with greater than 50% respiratory variability, suggesting right atrial pressure of 3 mmHg. FINDINGS  Left Ventricle: Left ventricular ejection fraction, by estimation, is 60 to 65%. Left ventricular ejection fraction by 3D volume is 62 %. The left ventricle has normal function. The left ventricle has no regional wall motion abnormalities. The average left ventricular global longitudinal strain is -20.1 %. Strain was performed  and the global longitudinal strain is normal. The left ventricular internal cavity size was normal in size. There is no left ventricular hypertrophy. Left ventricular diastolic parameters were normal. Right Ventricle: The right ventricular size is normal. No increase in right ventricular wall thickness. Right ventricular systolic function is normal. There is normal pulmonary artery systolic pressure. The tricuspid regurgitant velocity is 2.49 m/s, and  with an assumed right atrial pressure of 3 mmHg, the estimated right ventricular systolic pressure is 27.8 mmHg. Left [facility]: Left atrial size was normal in size. Right [facility]: Right atrial size was normal in size. Pericardium: Trivial pericardial effusion is present. There is no evidence of cardiac tamponade. Mitral Valve: The mitral valve is normal in structure. Mild mitral valve regurgitation. No evidence of mitral valve stenosis. Tricuspid Valve: The tricuspid valve is normal in structure. Tricuspid valve regurgitation is mild . No evidence of tricuspid stenosis. Aortic Valve: The aortic valve is tricuspid. Aortic valve regurgitation is not visualized. No aortic stenosis is present. Pulmonic Valve: The pulmonic valve was normal in structure. Pulmonic valve regurgitation is mild. No evidence of pulmonic stenosis. Aorta: The aortic root is normal in size and structure. Venous: The inferior vena cava is normal in size with greater than 50% respiratory variability, suggesting right atrial pressure of 3 mmHg. IAS/Shunts: No atrial level shunt detected by color flow Doppler. Additional Comments: 3D was performed not requiring image post processing on an independent workstation and was  normal.  LEFT VENTRICLE PLAX 2D LVIDd:         4.10 cm         Diastology LVIDs:         2.90 cm         LV e' medial:    7.62 cm/s LV PW:         0.90 cm         LV E/e' medial:  11.3 LV IVS:        0.60 cm         LV e' lateral:   4.90 cm/s LVOT diam:     1.60 cm         LV E/e' lateral:  17.5 LV SV:         35 LV SV Index:   20              2D Longitudinal LVOT Area:     2.01 cm        Strain                                2D Strain GLS   -24.5 %                                (A4C):                                2D Strain GLS   -18.3 %                                (A3C):                                2D Strain GLS   -17.4 %                                (A2C):                                2D Strain GLS   -20.1 %                                Avg:                                 3D Volume EF                                LV 3D EF:    Left                                             ventricul  ar                                             ejection                                             fraction                                             by 3D                                             volume is                                             62 %.                                 3D Volume EF:                                3D EF:        62 %                                LV EDV:       75 ml                                LV ESV:       29 ml                                LV SV:        46 ml RIGHT VENTRICLE RV Basal diam:  3.00 cm RV S prime:     11.60 cm/s TAPSE (M-mode): 1.6 cm RVSP:           27.8 mmHg LEFT ATRIUM             Index        RIGHT ATRIUM           Index LA diam:        2.90 cm 1.67 cm/m   RA Pressure: 3.00 mmHg LA Vol (A2C):   32.5 ml 18.76 ml/m  RA Area:     10.90 cm LA Vol (A4C):   23.2 ml 13.39 ml/m  RA Volume:   25.30 ml  14.60 ml/m LA Biplane Vol: 27.3 ml 15.76 ml/m  AORTIC VALVE LVOT Vmax:   72.70 cm/s LVOT Vmean:  48.900 cm/s LVOT VTI:    0.172 m  AORTA Ao Root diam: 2.70 cm Ao Asc diam:  2.20 cm MITRAL VALVE  TRICUSPID VALVE MV Area (PHT):             TR Peak grad:   24.8 mmHg MV Decel Time:             TR Vmax:        249.00 cm/s MV E velocity: 85.90 cm/s  Estimated RAP:  3.00 mmHg MV A velocity:  59.20 cm/s  RVSP:           27.8 mmHg MV E/A ratio:  1.45                            SHUNTS                            Systemic VTI:  0.17 m                            Systemic Diam: 1.60 cm Morene Brownie Electronically signed by Morene Brownie Signature Date/Time: 09/09/2023/4:53:53 PM    Final     Physical Exam: BP 102/62   Pulse 74   Ht 5' 7 (1.702 m)   Wt 137 lb 12.8 oz (62.5 kg)   SpO2 97%   BMI 21.58 kg/m  Constitutional: Pleasant,well-developed, ***female in no acute distress. HEENT: Normocephalic and atraumatic. Conjunctivae are normal. No scleral icterus. Neck supple.  Cardiovascular: Normal rate, regular rhythm.  Pulmonary/chest: Effort normal and breath sounds normal. No wheezing, rales or rhonchi. Abdominal: Soft, nondistended, nontender. Bowel sounds active throughout. There are no masses palpable. No hepatomegaly. Extremities: no edema Lymphadenopathy: No cervical adenopathy noted. Neurological: Alert and oriented to person place and time. Skin: Skin is warm and dry. No rashes noted. Psychiatric: Normal mood and affect. Behavior is normal.  CBC    Component Value Date/Time   WBC 6.2 12/25/2022 1346   RBC 4.50 12/25/2022 1346   HGB 14.1 12/25/2022 1346   HGB 13.1 10/02/2019 1257   HGB 13.7 12/03/2016 1445   HCT 42.6 12/25/2022 1346   HCT 41.3 12/03/2016 1445   PLT 136 (L) 12/25/2022 1346   PLT 154 10/02/2019 1257   PLT 118 (L) 12/03/2016 1445   MCV 94.7 12/25/2022 1346   MCV 89.3 12/03/2016 1445   MCH 31.3 12/25/2022 1346   MCHC 33.1 12/25/2022 1346   RDW 12.2 12/25/2022 1346   RDW 11.9 12/03/2016 1445   LYMPHSABS 1.8 03/26/2021 1636   LYMPHSABS 1.8 12/03/2016 1445   MONOABS 0.4 03/26/2021 1636   MONOABS 0.4 12/03/2016 1445   EOSABS 0.1 03/26/2021 1636   EOSABS 0.1 12/03/2016 1445   BASOSABS 0.0 03/26/2021 1636   BASOSABS 0.1 12/03/2016 1445    CMP     Component Value Date/Time   NA 137 12/25/2022 1346   NA 128 (L) 12/03/2016 1445   K 4.2  12/25/2022 1346   K 4.2 12/03/2016 1445   CL 102 12/25/2022 1346   CO2 24 12/25/2022 1346   CO2 27 12/03/2016 1445   GLUCOSE 85 12/25/2022 1346   GLUCOSE 96 12/03/2016 1445   BUN 26 (H) 12/25/2022 1346   BUN 11.4 12/03/2016 1445   CREATININE 1.18 (H) 12/25/2022 1346   CREATININE 1.15 (H) 03/26/2021 1636   CREATININE 1.1 12/03/2016 1445   CALCIUM  9.4 12/25/2022 1346   CALCIUM  9.4 12/03/2016 1445   PROT 6.2 03/26/2021 1636   PROT 6.3 (L) 12/03/2016 1445   ALBUMIN 4.0  11/27/2019 1304   ALBUMIN 4.3 12/03/2016 1445   AST 20 03/26/2021 1636   AST 18 10/02/2019 1257   AST 17 12/03/2016 1445   ALT 15 03/26/2021 1636   ALT 14 10/02/2019 1257   ALT 12 12/03/2016 1445   ALKPHOS 123 11/27/2019 1304   ALKPHOS 126 12/03/2016 1445   BILITOT 0.5 03/26/2021 1636   BILITOT 0.5 10/02/2019 1257   BILITOT 0.50 12/03/2016 1445   GFRNONAA 48 (L) 12/25/2022 1346   GFRNONAA 42 (L) 10/02/2019 1257   GFRAA 49 (L) 10/02/2019 1257       Latest Ref Rng & Units 12/25/2022    1:46 PM 03/26/2021    4:36 PM 11/27/2019    1:04 PM  CBC EXTENDED  WBC 4.0 - 10.5 K/uL 6.2  5.7  5.6   RBC 3.87 - 5.11 MIL/uL 4.50  4.42  4.04   Hemoglobin 12.0 - 15.0 g/dL 85.8  86.2  87.4   HCT 36.0 - 46.0 % 42.6  40.6  36.4   Platelets 150 - 400 K/uL 136  132.0  134   NEUT# 1.4 - 7.7 K/uL  3.4  3.4   Lymph# 0.7 - 4.0 K/uL  1.8  1.5       ASSESSMENT AND PLAN:  Larnell Hamilton, MD

## 2023-09-28 NOTE — Patient Instructions (Signed)
 We have sent the following medications to your pharmacy for you to pick up at your convenience:  Omeprazole  - discontinue Pantoprazole .   Stop Reglan  and Bentyl .  You have been given a low FODMAP handout to read.  _______________________________________________________  If your blood pressure at your visit was 140/90 or greater, please contact your primary care physician to follow up on this.  _______________________________________________________  If you are age 65 or older, your body mass index should be between 23-30. Your Body mass index is 21.58 kg/m. If this is out of the aforementioned range listed, please consider follow up with your Primary Care Provider.  If you are age 58 or younger, your body mass index should be between 19-25. Your Body mass index is 21.58 kg/m. If this is out of the aformentioned range listed, please consider follow up with your Primary Care Provider.   ________________________________________________________  The [facility] providers would like to encourage you to use MYCHART to communicate with providers for non-urgent requests or questions.  Due to long hold times on the telephone, sending your provider a message by [facility] may be a faster and more efficient way to get a response.  Please allow 48 business hours for a response.  Please remember that this is for non-urgent requests.  _______________________________________________________  [facility] is using a team-based approach to care.  Your team is made up of your doctor and two to three APPS. Our APPS (Nurse Practitioners and Physician Assistants) work with your physician to ensure care continuity for you. They are fully qualified to address your health concerns and develop a treatment plan. They communicate directly with your gastroenterologist to care for you. Seeing the Advanced Practice Practitioners on your physician's team can help you by facilitating care more promptly, often  allowing for earlier appointments, access to diagnostic testing, procedures, and other specialty referrals.

## 2023-09-29 LAB — LIPID PANEL
Chol/HDL Ratio: 2 ratio (ref 0.0–4.4)
Cholesterol, Total: 176 mg/dL (ref 100–199)
HDL: 90 mg/dL (ref >39)
LDL Chol Calc (NIH): 72 mg/dL (ref 0–99)
Triglycerides: 78 mg/dL (ref 0–149)
VLDL Cholesterol Cal: 14 mg/dL (ref 5–40)

## 2023-09-29 LAB — HEPATIC FUNCTION PANEL
ALT: 19 IU/L (ref 0–32)
AST: 23 IU/L (ref 0–40)
Albumin: 4.4 g/dL (ref 3.8–4.8)
Alkaline Phosphatase: 141 IU/L — ABNORMAL HIGH (ref 44–121)
Bilirubin Total: 0.6 mg/dL (ref 0.0–1.2)
Bilirubin, Direct: 0.22 mg/dL (ref 0.00–0.40)
Total Protein: 6.4 g/dL (ref 6.0–8.5)

## 2023-09-29 LAB — LIPOPROTEIN A (LPA): Lipoprotein (a): 23.6 nmol/L (ref <75.0)

## 2023-09-30 ENCOUNTER — Telehealth: Payer: Self-pay | Admitting: Internal Medicine

## 2023-09-30 NOTE — Telephone Encounter (Signed)
 Pt c/o medication issue:  1. Name of Medication:   atorvastatin  (LIPITOR) 20 MG tablet   2. How are you currently taking this medication (dosage and times per day)?   As prescribed  3. Are you having a reaction (difficulty breathing--STAT)?   4. What is your medication issue?   Patient stated this medication has helped her cholesterol but her last lab results showed her liver functions hare elevated.  Patient wants to know if she will need a medication change.

## 2023-09-30 NOTE — Telephone Encounter (Signed)
 Spoke with pt who is concerned because she was recently started on Atorvastatin  10 mg daily and her alkaline phosphatase is elevated at 141.  She would like to know if she needs to change her medication due to this finding.   Advised I will forward this to Dr Wendel and his nurse for review.  She is aware she will be contacted with further instructions.

## 2023-09-30 NOTE — Telephone Encounter (Signed)
 Spoke with pt and explained Dr. Parry recommendations. Order for LFT has been placed. Pt stated they will be going out of town until beginning of October and will get labs completed then.

## 2023-10-04 DIAGNOSIS — L309 Dermatitis, unspecified: Secondary | ICD-10-CM | POA: Diagnosis not present

## 2023-10-04 DIAGNOSIS — R21 Rash and other nonspecific skin eruption: Secondary | ICD-10-CM | POA: Diagnosis not present

## 2023-10-04 DIAGNOSIS — L299 Pruritus, unspecified: Secondary | ICD-10-CM | POA: Diagnosis not present

## 2023-10-19 ENCOUNTER — Other Ambulatory Visit: Payer: Self-pay | Admitting: *Deleted

## 2023-10-19 DIAGNOSIS — N1831 Chronic kidney disease, stage 3a: Secondary | ICD-10-CM

## 2023-10-19 DIAGNOSIS — E785 Hyperlipidemia, unspecified: Secondary | ICD-10-CM | POA: Diagnosis not present

## 2023-10-19 DIAGNOSIS — I700 Atherosclerosis of aorta: Secondary | ICD-10-CM

## 2023-10-19 DIAGNOSIS — H9201 Otalgia, right ear: Secondary | ICD-10-CM | POA: Diagnosis not present

## 2023-10-19 DIAGNOSIS — K219 Gastro-esophageal reflux disease without esophagitis: Secondary | ICD-10-CM | POA: Diagnosis not present

## 2023-10-19 DIAGNOSIS — H9211 Otorrhea, right ear: Secondary | ICD-10-CM | POA: Diagnosis not present

## 2023-10-19 DIAGNOSIS — H60501 Unspecified acute noninfective otitis externa, right ear: Secondary | ICD-10-CM | POA: Diagnosis not present

## 2023-10-19 DIAGNOSIS — R142 Eructation: Secondary | ICD-10-CM | POA: Diagnosis not present

## 2023-10-19 NOTE — Progress Notes (Signed)
 Order placed for LFT per Dr. Wendel

## 2023-10-20 ENCOUNTER — Ambulatory Visit: Payer: Self-pay | Admitting: Internal Medicine

## 2023-10-20 LAB — HEPATIC FUNCTION PANEL
ALT: 16 IU/L (ref 0–32)
AST: 19 IU/L (ref 0–40)
Albumin: 4.2 g/dL (ref 3.8–4.8)
Alkaline Phosphatase: 133 IU/L (ref 49–135)
Bilirubin Total: 0.4 mg/dL (ref 0.0–1.2)
Bilirubin, Direct: 0.14 mg/dL (ref 0.00–0.40)
Total Protein: 5.9 g/dL — ABNORMAL LOW (ref 6.0–8.5)

## 2023-10-26 DIAGNOSIS — H60501 Unspecified acute noninfective otitis externa, right ear: Secondary | ICD-10-CM | POA: Diagnosis not present

## 2023-11-10 DIAGNOSIS — H9211 Otorrhea, right ear: Secondary | ICD-10-CM | POA: Diagnosis not present

## 2023-11-10 DIAGNOSIS — H60501 Unspecified acute noninfective otitis externa, right ear: Secondary | ICD-10-CM | POA: Diagnosis not present

## 2023-11-12 ENCOUNTER — Other Ambulatory Visit: Payer: Self-pay | Admitting: Physician Assistant

## 2023-11-18 DIAGNOSIS — K5904 Chronic idiopathic constipation: Secondary | ICD-10-CM | POA: Diagnosis not present

## 2023-11-18 DIAGNOSIS — K9289 Other specified diseases of the digestive system: Secondary | ICD-10-CM | POA: Diagnosis not present

## 2023-11-18 DIAGNOSIS — R1084 Generalized abdominal pain: Secondary | ICD-10-CM | POA: Diagnosis not present

## 2023-11-26 DIAGNOSIS — Z8572 Personal history of non-Hodgkin lymphomas: Secondary | ICD-10-CM | POA: Diagnosis not present

## 2023-11-26 DIAGNOSIS — R109 Unspecified abdominal pain: Secondary | ICD-10-CM | POA: Diagnosis not present

## 2023-12-02 ENCOUNTER — Ambulatory Visit: Admitting: Gastroenterology

## 2023-12-14 ENCOUNTER — Encounter: Payer: Self-pay | Admitting: Internal Medicine

## 2024-02-10 ENCOUNTER — Telehealth: Payer: Self-pay | Admitting: Internal Medicine

## 2024-02-10 NOTE — Telephone Encounter (Signed)
 patient states that her heart is doing fine so there is no need for any further appts. Please advise

## 2024-02-10 NOTE — Telephone Encounter (Signed)
 Spoke with pt over the phone to ask why she felt she did not need the 57m f/u appt. Pt stated she has not been taking her statin medication d/t GI issues it was causing and she was worried our providers would be upset with her that she was not taking it. Pt said she also wasn't sure why she was even on a statin because her cholesterol wasn't all that high. Explained to pt that our office would not be upset that she is not on the statin and we can look into switching medications. Further explained that she was placed on a statin d/t to having aortic atherosclerosis for prevention purposes. Pt stated she completely forgot she had that diagnosis and felt she should come into the office for her follow-up appointment. Appt re-made with Dr. Wendel for 2/4 to discuss statin intolerance.

## 2024-02-17 NOTE — Progress Notes (Unsigned)
 "  Cardiology Office Note:   Date:  02/23/2024  ID:  Shannon Barajas, DOB 11/27/1950, MRN [id number] PCP:  Larnell Hamilton, MD  [facility] Providers Cardiologist:  Wendel Haws, MD Referring MD: Larnell Hamilton, MD  Chief Complaint/Reason for Referral:  Syncope ASSESSMENT:    1. Syncope and collapse   2. Hyperlipidemia LDL goal <70   3. Aortic atherosclerosis   4. Stage 3a chronic kidney disease (HCC)   5. Non-Hodgkin's lymphoma, unspecified body region, unspecified non-Hodgkin lymphoma type (HCC)      PLAN:   In order of problems listed above: Syncope: Monitor and echocardiogram are reassuring. Hyperlipidemia: Patient was intolerant of atorvastatin .  We talked about perhaps trialing Crestor however given her gastrointestinal issues which may be related to her IBS we decided to hold off on this.  I will see her back in 3 months to discuss further. Aortic atherosclerosis: Continue aspirin  81 mg and and defer rosuvastatin for now as above CKD stage IIIa: Consider SGLT2 inhibitor and ARB in the future NHL: Followed by other providers.            Dispo:  Return in about 3 months (around 05/22/2024).      Medication Adjustments/Labs and Tests Ordered: Current medicines are reviewed at length with the patient today.  Concerns regarding medicines are outlined above.  The following changes have been made:     Labs/tests ordered: Orders Placed This Encounter  Procedures   EKG 12-Lead    Medication Changes: No orders of the defined types were placed in this encounter.   Current medicines are reviewed at length with the patient today.  The patient does not have concerns regarding medicines.     History of Present Illness:    FOCUSED PROBLEM LIST:   Non-Hodgkin's lymphoma Treated with rituximab  >> remission CKD IIIA Hyperlipidemia LP(a) 23.6 Intolerant of atorvastatin  Aortic atherosclerosis Chest CT 2024 Syncope Rare SVT, isolated PACs, PVCs, no atrial  fibrillation monitor July 2025 Normal diastolic function, no significant valve issues, EF 60 to 65% TTE August 2025  July 2025:  Patient consents to use of AI scribe. The patient is a 74 year old female with the above listed medical problems referred for recommendations regarding syncope.  The patient had been seen by the PCP recently and had reported a syncopal episode.  This happened around the time her mother died.  She has had no recurrent episodes of syncope since then.  In March, she experienced a syncopal episode shortly after the passing of her mother, who had Alzheimer's and was in a nursing home for eight years. 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 The episode occurred late at night, and she felt 'a little weird' before losing consciousness completely. Darrell found her on the floor with epistaxis and called EMS, who found everything appeared normal. She has not experienced any similar episodes since.  No palpitations, shortness of breath, chest pain, or swelling in her legs. She also reports no issues with breathing while lying flat.  She has a history of IBS with constipation and is currently experiencing significant gastric symptoms, including excessive burping and gas, which she attributes to a possible hiatal hernia. She is trying to see a different GI doctor for further evaluation. She reports occasional rectal bleeding, which has been attributed to hemorrhoids.  Her cholesterol was last checked in February, showing a total cholesterol of 208 mg/dL with an LDL of 894 mg/dL. She eats eggs daily for breakfast and is concerned about its impact on her cholesterol  levels. She has a history of aortic atherosclerosis noted on a CT scan in 2024.  She is currently on Protonix  and other medications for her stomach issues. She has a history of Hodgkin's lymphoma and is cautious about medication side effects due to past experiences.  Plan: Start aspirin  81 mg, atorvastatin  20 mg, check lipid panel,  LFTs, LP(a) in 2 months.  Obtain monitoring echocardiogram for syncope  February 2026:  Patient consents to use of AI scribe. In the interim the patient's monitor and echocardiogram were reassuring.  Her LDL was 72.  She stopped taking her atorvastatin  due to GI issues.  She is here to discuss further.  She has a history of aortic atherosclerosis, identified on a CT scan of the chest in 2024, which showed cholesterol deposits in the aorta. Her cholesterol levels were initially 204 mg/dL and have since decreased to 179 mg/dL, with an LDL of 72 mg/dL. She previously took atorvastatin  but discontinued it due to gastrointestinal side effects.  She has been experiencing gastrointestinal issues since May of the previous year, including bloating, gas, and abdominal pain. These symptoms have been persistent and sometimes severe, impacting her daily life. She suspects that atorvastatin  and the generic form of Pepcid  may have contributed to her symptoms, leading her to stop both medications before Christmas. Despite some improvement, she continues to experience pain on many days.  She is currently under the care of a digestive specialist in [place] and is scheduled for a hydrogen breath test on February 24th to evaluate for small intestinal bacterial overgrowth (SIBO). Previous CT scans have not revealed any significant findings. She has been following a low FODMAP diet to manage her symptoms.  She has a history of irritable bowel syndrome (IBS) for many years. Recent attempts to treat her gastrointestinal symptoms with antibiotics, including Cipro and Flagyl , were unsuccessful due to severe side effects, including insomnia and worsened stomach pain, leading her to discontinue the medications.      Current Medications: Current Meds  Medication Sig   acetaminophen  (TYLENOL ) 325 MG tablet Take 650 mg by mouth every 6 (six) hours as needed.   ascorbic acid (VITAMIN C) 500 MG tablet Take 500 mg by mouth daily.    aspirin  EC 81 MG tablet Take 1 tablet (81 mg total) by mouth daily. Swallow whole.   Cholecalciferol (VITAMIN D) 50 MCG (2000 UT) tablet Take 2,000 Units by mouth daily.   COLLAGEN PO Take 1 capsule by mouth in the morning and at bedtime.   estradiol (ESTRACE) 1 MG tablet Take 1 mg by mouth at bedtime.   Linaclotide  (LINZESS ) 290 MCG CAPS capsule Take 1 capsule (290 mcg total) by mouth every morning. (Patient taking differently: Take 290 mcg by mouth every morning. Taking every other day)   Methylcobalamin (B12) 5000 MCG SUBL as directed Sublingual   Multiple Vitamin (MULTIVITAMIN WITH MINERALS) TABS tablet Take 1 tablet by mouth daily.   omeprazole  (PRILOSEC) 20 MG capsule Take 1 capsule (20 mg total) by mouth daily.   progesterone (PROMETRIUM) 100 MG capsule Take 100 mg by mouth at bedtime.    Propylene Glycol (SYSTANE COMPLETE PF OP) Apply 1 drop to eye daily.   temazepam  (RESTORIL ) 30 MG capsule Take 2 capsules (60 mg total) by mouth at bedtime.   thiamine (VITAMIN B-1) 50 MG tablet Take 50 mg by mouth daily.     Review of Systems:   Please see the history of present illness.    All other systems reviewed and  are negative.     EKGs/Labs/Other Test Reviewed:   EKG: December 2024 NSR  EKG Interpretation Date/Time:  Wednesday February 23 2024 14:41:56 EST Ventricular Rate:  70 PR Interval:  140 QRS Duration:  78 QT Interval:  388 QTC Calculation: 419 R Axis:   53  Text Interpretation: Normal sinus rhythm Normal ECG When compared with ECG of 25-Dec-2022 13:56, No significant change was found Confirmed by Wendel Haws ([id number]) on 02/23/2024 2:46:37 PM         Risk Assessment/Calculations:          Physical Exam:   VS:  BP 116/60 (BP Location: Left Arm, Patient Position: Sitting, Cuff Size: Normal)   Pulse 70   Ht 5' 7 (1.702 m)   Wt 134 lb 12.8 oz (61.1 kg)   SpO2 97%   BMI 21.11 kg/m        Wt Readings from Last 3 Encounters:  02/23/24 134 lb 12.8 oz (61.1 kg)   09/28/23 137 lb 12.8 oz (62.5 kg)  08/02/23 139 lb (63 kg)      GENERAL:  No apparent distress, AOx3 HEENT:  No carotid bruits, +2 carotid impulses, no scleral icterus CAR: RRR no murmurs, gallops, rubs, or thrills RES:  Clear to auscultation bilaterally ABD:  Soft, nontender, nondistended, positive bowel sounds x 4 VASC:  +2 radial pulses, +2 carotid pulses NEURO:  CN 2-12 grossly intact; motor and sensory grossly intact PSYCH:  No active depression or anxiety EXT:  No edema, ecchymosis, or cyanosis  Signed, Lashanna Angelo K Dellene Mcgroarty, MD  02/23/2024 2:58 PM    [facility] [address] [place], [place], [place]  [address] Phone: [phone]; Fax: [phone]   Note:  This document was prepared using Dragon voice recognition software and may include unintentional dictation errors. "

## 2024-02-23 ENCOUNTER — Ambulatory Visit: Admitting: Internal Medicine

## 2024-02-23 ENCOUNTER — Encounter: Payer: Self-pay | Admitting: Internal Medicine

## 2024-02-23 ENCOUNTER — Ambulatory Visit: Admitting: Cardiology

## 2024-02-23 VITALS — BP 116/60 | HR 70 | Ht 67.0 in | Wt 134.8 lb

## 2024-02-23 DIAGNOSIS — C8590 Non-Hodgkin lymphoma, unspecified, unspecified site: Secondary | ICD-10-CM

## 2024-02-23 DIAGNOSIS — N1831 Chronic kidney disease, stage 3a: Secondary | ICD-10-CM | POA: Diagnosis not present

## 2024-02-23 DIAGNOSIS — I700 Atherosclerosis of aorta: Secondary | ICD-10-CM

## 2024-02-23 DIAGNOSIS — E785 Hyperlipidemia, unspecified: Secondary | ICD-10-CM

## 2024-02-23 DIAGNOSIS — R55 Syncope and collapse: Secondary | ICD-10-CM | POA: Diagnosis not present

## 2024-02-23 NOTE — Patient Instructions (Signed)
" °  Follow-Up: At [facility], you and your health needs are our priority.  As part of our continuing mission to provide you with exceptional heart care, our providers are all part of one team.  This team includes your primary Cardiologist (physician) and Advanced Practice Providers or APPs (Physician Assistants and Nurse Practitioners) who all work together to provide you with the care you need, when you need it.  Your next appointment:   3 month(s)  Provider:   Arun K Thukkani, MD             "

## 2024-05-26 ENCOUNTER — Ambulatory Visit: Admitting: Internal Medicine
